# Patient Record
Sex: Female | Born: 1965 | Race: Black or African American | Hispanic: No | Marital: Single | Smoking: Current every day smoker
Health system: Southern US, Community
[De-identification: ages and names within clinical notes are randomized; demographics above are authoritative.]

## PROBLEM LIST (undated history)

## (undated) DIAGNOSIS — F32A Depression, unspecified: Secondary | ICD-10-CM

## (undated) DIAGNOSIS — J45909 Unspecified asthma, uncomplicated: Secondary | ICD-10-CM

## (undated) DIAGNOSIS — B20 Human immunodeficiency virus [HIV] disease: Secondary | ICD-10-CM

## (undated) DIAGNOSIS — E039 Hypothyroidism, unspecified: Secondary | ICD-10-CM

## (undated) DIAGNOSIS — S82899A Other fracture of unspecified lower leg, initial encounter for closed fracture: Secondary | ICD-10-CM

## (undated) DIAGNOSIS — Z21 Asymptomatic human immunodeficiency virus [HIV] infection status: Secondary | ICD-10-CM

## (undated) DIAGNOSIS — K469 Unspecified abdominal hernia without obstruction or gangrene: Secondary | ICD-10-CM

## (undated) DIAGNOSIS — A159 Respiratory tuberculosis unspecified: Secondary | ICD-10-CM

## (undated) DIAGNOSIS — F419 Anxiety disorder, unspecified: Secondary | ICD-10-CM

## (undated) DIAGNOSIS — B1920 Unspecified viral hepatitis C without hepatic coma: Secondary | ICD-10-CM

## (undated) DIAGNOSIS — I10 Essential (primary) hypertension: Secondary | ICD-10-CM

## (undated) DIAGNOSIS — R945 Abnormal results of liver function studies: Secondary | ICD-10-CM

## (undated) DIAGNOSIS — G589 Mononeuropathy, unspecified: Secondary | ICD-10-CM

## (undated) DIAGNOSIS — M359 Systemic involvement of connective tissue, unspecified: Secondary | ICD-10-CM

## (undated) DIAGNOSIS — K219 Gastro-esophageal reflux disease without esophagitis: Secondary | ICD-10-CM

## (undated) DIAGNOSIS — M419 Scoliosis, unspecified: Secondary | ICD-10-CM

## (undated) DIAGNOSIS — J449 Chronic obstructive pulmonary disease, unspecified: Secondary | ICD-10-CM

## (undated) DIAGNOSIS — F329 Major depressive disorder, single episode, unspecified: Secondary | ICD-10-CM

## (undated) HISTORY — PX: LIVER BIOPSY: SHX301

## (undated) HISTORY — DX: Abnormal results of liver function studies: R94.5

## (undated) HISTORY — PX: HERNIA REPAIR: SHX51

## (undated) HISTORY — PX: CHOLECYSTECTOMY: SHX55

---

## 2000-04-05 ENCOUNTER — Encounter: Payer: Self-pay | Admitting: Gastroenterology

## 2000-04-05 ENCOUNTER — Ambulatory Visit (HOSPITAL_COMMUNITY): Admission: RE | Admit: 2000-04-05 | Discharge: 2000-04-05 | Payer: Self-pay | Admitting: Gastroenterology

## 2000-04-05 ENCOUNTER — Encounter (INDEPENDENT_AMBULATORY_CARE_PROVIDER_SITE_OTHER): Payer: Self-pay | Admitting: Specialist

## 2000-08-27 ENCOUNTER — Emergency Department (HOSPITAL_COMMUNITY): Admission: EM | Admit: 2000-08-27 | Discharge: 2000-08-27 | Payer: Self-pay | Admitting: Emergency Medicine

## 2000-08-28 ENCOUNTER — Encounter: Payer: Self-pay | Admitting: Unknown Physician Specialty

## 2000-08-28 ENCOUNTER — Ambulatory Visit (HOSPITAL_COMMUNITY): Admission: RE | Admit: 2000-08-28 | Discharge: 2000-08-28 | Payer: Self-pay | Admitting: Unknown Physician Specialty

## 2000-12-03 ENCOUNTER — Emergency Department (HOSPITAL_COMMUNITY): Admission: EM | Admit: 2000-12-03 | Discharge: 2000-12-03 | Payer: Self-pay | Admitting: Emergency Medicine

## 2001-04-13 ENCOUNTER — Emergency Department (HOSPITAL_COMMUNITY): Admission: EM | Admit: 2001-04-13 | Discharge: 2001-04-13 | Payer: Self-pay | Admitting: Emergency Medicine

## 2002-08-13 ENCOUNTER — Encounter: Payer: Self-pay | Admitting: Emergency Medicine

## 2002-08-13 ENCOUNTER — Emergency Department (HOSPITAL_COMMUNITY): Admission: EM | Admit: 2002-08-13 | Discharge: 2002-08-13 | Payer: Self-pay | Admitting: Emergency Medicine

## 2002-10-10 ENCOUNTER — Emergency Department (HOSPITAL_COMMUNITY): Admission: EM | Admit: 2002-10-10 | Discharge: 2002-10-11 | Payer: Self-pay | Admitting: Emergency Medicine

## 2002-10-13 ENCOUNTER — Emergency Department (HOSPITAL_COMMUNITY): Admission: EM | Admit: 2002-10-13 | Discharge: 2002-10-13 | Payer: Self-pay | Admitting: Emergency Medicine

## 2002-11-11 ENCOUNTER — Encounter: Payer: Self-pay | Admitting: Internal Medicine

## 2002-11-11 ENCOUNTER — Ambulatory Visit (HOSPITAL_COMMUNITY): Admission: RE | Admit: 2002-11-11 | Discharge: 2002-11-11 | Payer: Self-pay | Admitting: Internal Medicine

## 2002-11-12 ENCOUNTER — Emergency Department (HOSPITAL_COMMUNITY): Admission: EM | Admit: 2002-11-12 | Discharge: 2002-11-12 | Payer: Self-pay | Admitting: Emergency Medicine

## 2003-11-05 ENCOUNTER — Ambulatory Visit: Payer: Self-pay | Admitting: *Deleted

## 2003-11-24 ENCOUNTER — Ambulatory Visit: Payer: Self-pay | Admitting: *Deleted

## 2004-01-08 ENCOUNTER — Ambulatory Visit: Payer: Self-pay | Admitting: Family Medicine

## 2004-01-15 ENCOUNTER — Ambulatory Visit: Payer: Self-pay | Admitting: Family Medicine

## 2004-01-16 ENCOUNTER — Emergency Department (HOSPITAL_COMMUNITY): Admission: EM | Admit: 2004-01-16 | Discharge: 2004-01-16 | Payer: Self-pay | Admitting: *Deleted

## 2004-01-18 ENCOUNTER — Ambulatory Visit: Payer: Self-pay | Admitting: Family Medicine

## 2004-02-08 ENCOUNTER — Ambulatory Visit: Payer: Self-pay | Admitting: Family Medicine

## 2004-02-09 ENCOUNTER — Ambulatory Visit: Payer: Self-pay | Admitting: Family Medicine

## 2004-02-13 ENCOUNTER — Emergency Department (HOSPITAL_COMMUNITY): Admission: EM | Admit: 2004-02-13 | Discharge: 2004-02-14 | Payer: Self-pay | Admitting: Emergency Medicine

## 2004-02-15 ENCOUNTER — Ambulatory Visit: Payer: Self-pay | Admitting: Family Medicine

## 2004-02-23 ENCOUNTER — Ambulatory Visit: Payer: Self-pay | Admitting: Family Medicine

## 2004-03-15 ENCOUNTER — Emergency Department (HOSPITAL_COMMUNITY): Admission: EM | Admit: 2004-03-15 | Discharge: 2004-03-15 | Payer: Self-pay | Admitting: *Deleted

## 2004-03-23 ENCOUNTER — Ambulatory Visit: Payer: Self-pay | Admitting: Family Medicine

## 2004-05-08 ENCOUNTER — Emergency Department (HOSPITAL_COMMUNITY): Admission: EM | Admit: 2004-05-08 | Discharge: 2004-05-08 | Payer: Self-pay | Admitting: Emergency Medicine

## 2004-07-13 ENCOUNTER — Ambulatory Visit: Payer: Self-pay | Admitting: Family Medicine

## 2004-07-19 ENCOUNTER — Emergency Department (HOSPITAL_COMMUNITY): Admission: EM | Admit: 2004-07-19 | Discharge: 2004-07-19 | Payer: Self-pay | Admitting: Emergency Medicine

## 2004-07-22 ENCOUNTER — Ambulatory Visit: Payer: Self-pay | Admitting: Family Medicine

## 2004-07-22 ENCOUNTER — Emergency Department (HOSPITAL_COMMUNITY): Admission: EM | Admit: 2004-07-22 | Discharge: 2004-07-22 | Payer: Self-pay | Admitting: Emergency Medicine

## 2004-07-26 ENCOUNTER — Emergency Department (HOSPITAL_COMMUNITY): Admission: EM | Admit: 2004-07-26 | Discharge: 2004-07-26 | Payer: Self-pay | Admitting: Emergency Medicine

## 2004-07-28 ENCOUNTER — Ambulatory Visit (HOSPITAL_COMMUNITY): Admission: RE | Admit: 2004-07-28 | Discharge: 2004-07-28 | Payer: Self-pay | Admitting: Family Medicine

## 2004-08-02 ENCOUNTER — Encounter: Payer: Self-pay | Admitting: Internal Medicine

## 2004-08-03 ENCOUNTER — Ambulatory Visit: Payer: Self-pay | Admitting: Family Medicine

## 2004-08-10 ENCOUNTER — Emergency Department (HOSPITAL_COMMUNITY): Admission: EM | Admit: 2004-08-10 | Discharge: 2004-08-10 | Payer: Self-pay | Admitting: Emergency Medicine

## 2004-09-05 ENCOUNTER — Ambulatory Visit: Payer: Self-pay | Admitting: Internal Medicine

## 2004-09-22 ENCOUNTER — Ambulatory Visit: Payer: Self-pay | Admitting: Family Medicine

## 2004-11-22 ENCOUNTER — Ambulatory Visit: Payer: Self-pay | Admitting: Family Medicine

## 2004-11-23 ENCOUNTER — Encounter (INDEPENDENT_AMBULATORY_CARE_PROVIDER_SITE_OTHER): Payer: Self-pay | Admitting: *Deleted

## 2004-11-23 ENCOUNTER — Ambulatory Visit (HOSPITAL_COMMUNITY): Admission: RE | Admit: 2004-11-23 | Discharge: 2004-11-23 | Payer: Self-pay | Admitting: General Surgery

## 2005-01-03 ENCOUNTER — Ambulatory Visit: Payer: Self-pay | Admitting: Nurse Practitioner

## 2005-06-30 ENCOUNTER — Ambulatory Visit: Payer: Self-pay | Admitting: Family Medicine

## 2005-06-30 ENCOUNTER — Emergency Department (HOSPITAL_COMMUNITY): Admission: EM | Admit: 2005-06-30 | Discharge: 2005-06-30 | Payer: Self-pay | Admitting: Family Medicine

## 2005-07-03 ENCOUNTER — Emergency Department (HOSPITAL_COMMUNITY): Admission: EM | Admit: 2005-07-03 | Discharge: 2005-07-03 | Payer: Self-pay | Admitting: Emergency Medicine

## 2005-07-15 ENCOUNTER — Emergency Department (HOSPITAL_COMMUNITY): Admission: EM | Admit: 2005-07-15 | Discharge: 2005-07-15 | Payer: Self-pay | Admitting: Family Medicine

## 2005-07-15 ENCOUNTER — Emergency Department (HOSPITAL_COMMUNITY): Admission: EM | Admit: 2005-07-15 | Discharge: 2005-07-15 | Payer: Self-pay | Admitting: Emergency Medicine

## 2005-07-20 ENCOUNTER — Emergency Department (HOSPITAL_COMMUNITY): Admission: EM | Admit: 2005-07-20 | Discharge: 2005-07-20 | Payer: Self-pay | Admitting: Emergency Medicine

## 2005-08-18 ENCOUNTER — Emergency Department (HOSPITAL_COMMUNITY): Admission: EM | Admit: 2005-08-18 | Discharge: 2005-08-19 | Payer: Self-pay | Admitting: *Deleted

## 2005-08-20 ENCOUNTER — Emergency Department (HOSPITAL_COMMUNITY): Admission: EM | Admit: 2005-08-20 | Discharge: 2005-08-20 | Payer: Self-pay | Admitting: Emergency Medicine

## 2005-08-25 ENCOUNTER — Emergency Department (HOSPITAL_COMMUNITY): Admission: EM | Admit: 2005-08-25 | Discharge: 2005-08-25 | Payer: Self-pay | Admitting: Emergency Medicine

## 2005-09-12 ENCOUNTER — Emergency Department (HOSPITAL_COMMUNITY): Admission: EM | Admit: 2005-09-12 | Discharge: 2005-09-12 | Payer: Self-pay | Admitting: Emergency Medicine

## 2006-07-13 ENCOUNTER — Emergency Department (HOSPITAL_COMMUNITY): Admission: EM | Admit: 2006-07-13 | Discharge: 2006-07-13 | Payer: Self-pay | Admitting: Emergency Medicine

## 2006-10-03 ENCOUNTER — Emergency Department (HOSPITAL_COMMUNITY): Admission: EM | Admit: 2006-10-03 | Discharge: 2006-10-04 | Payer: Self-pay | Admitting: Emergency Medicine

## 2006-12-07 ENCOUNTER — Emergency Department (HOSPITAL_COMMUNITY): Admission: EM | Admit: 2006-12-07 | Discharge: 2006-12-07 | Payer: Self-pay | Admitting: Emergency Medicine

## 2006-12-11 ENCOUNTER — Emergency Department (HOSPITAL_COMMUNITY): Admission: EM | Admit: 2006-12-11 | Discharge: 2006-12-11 | Payer: Self-pay | Admitting: Emergency Medicine

## 2007-02-28 ENCOUNTER — Encounter: Payer: Self-pay | Admitting: Family Medicine

## 2008-10-28 ENCOUNTER — Emergency Department (HOSPITAL_COMMUNITY): Admission: EM | Admit: 2008-10-28 | Discharge: 2008-10-29 | Payer: Self-pay | Admitting: Emergency Medicine

## 2008-11-03 ENCOUNTER — Emergency Department (HOSPITAL_COMMUNITY): Admission: EM | Admit: 2008-11-03 | Discharge: 2008-11-03 | Payer: Self-pay | Admitting: Emergency Medicine

## 2008-11-10 ENCOUNTER — Emergency Department (HOSPITAL_COMMUNITY): Admission: EM | Admit: 2008-11-10 | Discharge: 2008-11-10 | Payer: Self-pay | Admitting: Emergency Medicine

## 2008-11-10 ENCOUNTER — Emergency Department (HOSPITAL_COMMUNITY): Admission: EM | Admit: 2008-11-10 | Discharge: 2008-11-10 | Payer: Self-pay | Admitting: Dietician

## 2008-11-23 ENCOUNTER — Emergency Department: Payer: Self-pay | Admitting: Emergency Medicine

## 2008-11-26 ENCOUNTER — Emergency Department: Payer: Self-pay | Admitting: Emergency Medicine

## 2008-11-27 ENCOUNTER — Emergency Department (HOSPITAL_COMMUNITY): Admission: EM | Admit: 2008-11-27 | Discharge: 2008-11-27 | Payer: Self-pay | Admitting: Emergency Medicine

## 2008-11-28 ENCOUNTER — Emergency Department (HOSPITAL_COMMUNITY): Admission: EM | Admit: 2008-11-28 | Discharge: 2008-11-29 | Payer: Self-pay | Admitting: Emergency Medicine

## 2009-01-06 ENCOUNTER — Emergency Department (HOSPITAL_COMMUNITY): Admission: EM | Admit: 2009-01-06 | Discharge: 2009-01-06 | Payer: Self-pay | Admitting: Emergency Medicine

## 2009-01-11 ENCOUNTER — Emergency Department (HOSPITAL_COMMUNITY): Admission: EM | Admit: 2009-01-11 | Discharge: 2009-01-11 | Payer: Self-pay | Admitting: Emergency Medicine

## 2009-01-14 ENCOUNTER — Emergency Department (HOSPITAL_COMMUNITY): Admission: EM | Admit: 2009-01-14 | Discharge: 2009-01-14 | Payer: Self-pay | Admitting: Emergency Medicine

## 2009-01-17 ENCOUNTER — Emergency Department (HOSPITAL_COMMUNITY): Admission: EM | Admit: 2009-01-17 | Discharge: 2009-01-17 | Payer: Self-pay | Admitting: Emergency Medicine

## 2009-01-22 ENCOUNTER — Emergency Department (HOSPITAL_COMMUNITY): Admission: EM | Admit: 2009-01-22 | Discharge: 2009-01-22 | Payer: Self-pay | Admitting: Emergency Medicine

## 2009-01-25 ENCOUNTER — Emergency Department (HOSPITAL_COMMUNITY): Admission: EM | Admit: 2009-01-25 | Discharge: 2009-01-25 | Payer: Self-pay | Admitting: Emergency Medicine

## 2009-02-02 ENCOUNTER — Encounter: Payer: Self-pay | Admitting: Infectious Disease

## 2009-02-09 ENCOUNTER — Emergency Department (HOSPITAL_COMMUNITY): Admission: EM | Admit: 2009-02-09 | Discharge: 2009-02-09 | Payer: Self-pay | Admitting: Emergency Medicine

## 2009-02-24 ENCOUNTER — Emergency Department (HOSPITAL_COMMUNITY): Admission: EM | Admit: 2009-02-24 | Discharge: 2009-02-24 | Payer: Self-pay | Admitting: Emergency Medicine

## 2009-03-01 ENCOUNTER — Emergency Department (HOSPITAL_COMMUNITY): Admission: EM | Admit: 2009-03-01 | Discharge: 2009-03-01 | Payer: Self-pay | Admitting: Emergency Medicine

## 2009-03-11 ENCOUNTER — Encounter: Payer: Self-pay | Admitting: Internal Medicine

## 2009-03-12 ENCOUNTER — Encounter: Payer: Self-pay | Admitting: Infectious Diseases

## 2009-03-18 ENCOUNTER — Encounter: Payer: Self-pay | Admitting: Internal Medicine

## 2009-03-18 ENCOUNTER — Ambulatory Visit: Payer: Self-pay | Admitting: Internal Medicine

## 2009-03-18 DIAGNOSIS — F314 Bipolar disorder, current episode depressed, severe, without psychotic features: Secondary | ICD-10-CM | POA: Insufficient documentation

## 2009-03-18 DIAGNOSIS — M25569 Pain in unspecified knee: Secondary | ICD-10-CM | POA: Insufficient documentation

## 2009-03-18 DIAGNOSIS — B1920 Unspecified viral hepatitis C without hepatic coma: Secondary | ICD-10-CM | POA: Insufficient documentation

## 2009-03-18 DIAGNOSIS — E119 Type 2 diabetes mellitus without complications: Secondary | ICD-10-CM | POA: Insufficient documentation

## 2009-03-18 DIAGNOSIS — B20 Human immunodeficiency virus [HIV] disease: Secondary | ICD-10-CM | POA: Insufficient documentation

## 2009-03-18 DIAGNOSIS — F209 Schizophrenia, unspecified: Secondary | ICD-10-CM | POA: Insufficient documentation

## 2009-03-18 LAB — CONVERTED CEMR LAB
HCV Quantitative: 481000 intl units/mL — ABNORMAL HIGH (ref <43)
HIV 1 RNA Quant: 59800 copies/mL — ABNORMAL HIGH (ref <48)

## 2009-03-19 ENCOUNTER — Ambulatory Visit: Payer: Self-pay | Admitting: Internal Medicine

## 2009-03-19 DIAGNOSIS — K439 Ventral hernia without obstruction or gangrene: Secondary | ICD-10-CM | POA: Insufficient documentation

## 2009-03-19 DIAGNOSIS — I10 Essential (primary) hypertension: Secondary | ICD-10-CM

## 2009-03-19 DIAGNOSIS — E1159 Type 2 diabetes mellitus with other circulatory complications: Secondary | ICD-10-CM | POA: Insufficient documentation

## 2009-03-19 DIAGNOSIS — M549 Dorsalgia, unspecified: Secondary | ICD-10-CM | POA: Insufficient documentation

## 2009-03-22 ENCOUNTER — Encounter: Payer: Self-pay | Admitting: Licensed Clinical Social Worker

## 2009-04-05 ENCOUNTER — Encounter: Payer: Self-pay | Admitting: Internal Medicine

## 2009-04-08 ENCOUNTER — Ambulatory Visit: Payer: Self-pay | Admitting: Internal Medicine

## 2009-04-08 LAB — CONVERTED CEMR LAB
Barbiturate Quant, Ur: NEGATIVE
Blood Glucose, Fingerstick: 127
Blood Glucose, Home Monitor: 2 mg/dL
Cocaine Metabolites: POSITIVE — AB
Creatinine,U: 230.8 mg/dL
Methadone: NEGATIVE
Opiates: NEGATIVE

## 2009-04-09 ENCOUNTER — Encounter: Payer: Self-pay | Admitting: Internal Medicine

## 2009-04-12 ENCOUNTER — Telehealth: Payer: Self-pay

## 2009-04-14 ENCOUNTER — Encounter: Payer: Self-pay | Admitting: Internal Medicine

## 2009-04-21 ENCOUNTER — Telehealth (INDEPENDENT_AMBULATORY_CARE_PROVIDER_SITE_OTHER): Payer: Self-pay | Admitting: *Deleted

## 2009-05-04 ENCOUNTER — Encounter (INDEPENDENT_AMBULATORY_CARE_PROVIDER_SITE_OTHER): Payer: Self-pay | Admitting: *Deleted

## 2009-05-13 ENCOUNTER — Telehealth (INDEPENDENT_AMBULATORY_CARE_PROVIDER_SITE_OTHER): Payer: Self-pay | Admitting: *Deleted

## 2009-05-31 ENCOUNTER — Ambulatory Visit: Payer: Self-pay | Admitting: Internal Medicine

## 2009-05-31 ENCOUNTER — Telehealth: Payer: Self-pay | Admitting: Internal Medicine

## 2009-05-31 DIAGNOSIS — H9209 Otalgia, unspecified ear: Secondary | ICD-10-CM | POA: Insufficient documentation

## 2009-05-31 DIAGNOSIS — K089 Disorder of teeth and supporting structures, unspecified: Secondary | ICD-10-CM | POA: Insufficient documentation

## 2009-05-31 LAB — CONVERTED CEMR LAB: Blood Glucose, Fingerstick: 111

## 2009-06-01 ENCOUNTER — Telehealth: Payer: Self-pay | Admitting: Internal Medicine

## 2009-06-01 ENCOUNTER — Telehealth: Payer: Self-pay | Admitting: *Deleted

## 2009-06-02 ENCOUNTER — Emergency Department (HOSPITAL_COMMUNITY): Admission: EM | Admit: 2009-06-02 | Discharge: 2009-06-02 | Payer: Self-pay | Admitting: Emergency Medicine

## 2009-06-03 ENCOUNTER — Telehealth: Payer: Self-pay | Admitting: Internal Medicine

## 2009-06-11 ENCOUNTER — Telehealth: Payer: Self-pay | Admitting: Internal Medicine

## 2009-06-11 ENCOUNTER — Encounter: Payer: Self-pay | Admitting: Internal Medicine

## 2009-06-11 ENCOUNTER — Ambulatory Visit: Payer: Self-pay | Admitting: Internal Medicine

## 2009-06-11 DIAGNOSIS — R21 Rash and other nonspecific skin eruption: Secondary | ICD-10-CM | POA: Insufficient documentation

## 2009-06-11 LAB — CONVERTED CEMR LAB
BUN: 17 mg/dL (ref 6–23)
Basophils Relative: 0 % (ref 0–1)
CO2: 24 meq/L (ref 19–32)
Calcium: 10.2 mg/dL (ref 8.4–10.5)
Chloride: 97 meq/L (ref 96–112)
Creatinine, Ser: 1.16 mg/dL (ref 0.40–1.20)
Eosinophils Absolute: 0.1 10*3/uL (ref 0.0–0.7)
Eosinophils Relative: 3 % (ref 0–5)
Glucose, Bld: 93 mg/dL (ref 70–99)
HCT: 42.3 % (ref 36.0–46.0)
HIV 1 RNA Quant: 2360 copies/mL — ABNORMAL HIGH (ref <48)
HIV-1 RNA Quant, Log: 3.37 — ABNORMAL HIGH (ref <1.68)
Lymphs Abs: 1.2 10*3/uL (ref 0.7–4.0)
MCHC: 35 g/dL (ref 30.0–36.0)
MCV: 89.1 fL (ref 78.0–100.0)
Monocytes Relative: 11 % (ref 3–12)
Neutrophils Relative %: 54 % (ref 43–77)
Platelets: 130 10*3/uL — ABNORMAL LOW (ref 150–400)
RBC: 4.75 M/uL (ref 3.87–5.11)
Total Bilirubin: 0.3 mg/dL (ref 0.3–1.2)
WBC: 3.9 10*3/uL — ABNORMAL LOW (ref 4.0–10.5)

## 2009-06-16 ENCOUNTER — Ambulatory Visit: Payer: Self-pay | Admitting: Internal Medicine

## 2009-06-16 DIAGNOSIS — G609 Hereditary and idiopathic neuropathy, unspecified: Secondary | ICD-10-CM | POA: Insufficient documentation

## 2009-06-22 ENCOUNTER — Encounter: Payer: Self-pay | Admitting: Internal Medicine

## 2009-06-22 ENCOUNTER — Telehealth: Payer: Self-pay | Admitting: Internal Medicine

## 2009-06-23 ENCOUNTER — Telehealth: Payer: Self-pay | Admitting: *Deleted

## 2009-06-23 ENCOUNTER — Encounter: Payer: Self-pay | Admitting: Internal Medicine

## 2009-06-24 ENCOUNTER — Telehealth: Payer: Self-pay | Admitting: Internal Medicine

## 2009-06-28 ENCOUNTER — Emergency Department (HOSPITAL_COMMUNITY): Admission: EM | Admit: 2009-06-28 | Discharge: 2009-06-28 | Payer: Self-pay | Admitting: Family Medicine

## 2009-06-28 ENCOUNTER — Encounter: Payer: Self-pay | Admitting: Internal Medicine

## 2009-06-29 ENCOUNTER — Telehealth: Payer: Self-pay | Admitting: *Deleted

## 2009-06-29 ENCOUNTER — Encounter: Payer: Self-pay | Admitting: Internal Medicine

## 2009-06-30 ENCOUNTER — Ambulatory Visit: Payer: Self-pay | Admitting: Internal Medicine

## 2009-06-30 DIAGNOSIS — F1910 Other psychoactive substance abuse, uncomplicated: Secondary | ICD-10-CM | POA: Insufficient documentation

## 2009-06-30 LAB — CONVERTED CEMR LAB
Amphetamine Screen, Ur: NEGATIVE
Benzodiazepines.: NEGATIVE
Marijuana Metabolite: POSITIVE — AB
Phencyclidine (PCP): NEGATIVE

## 2009-07-01 ENCOUNTER — Ambulatory Visit: Payer: Self-pay | Admitting: Internal Medicine

## 2009-07-05 ENCOUNTER — Telehealth: Payer: Self-pay | Admitting: Internal Medicine

## 2009-07-06 ENCOUNTER — Encounter: Payer: Self-pay | Admitting: Internal Medicine

## 2009-07-07 ENCOUNTER — Ambulatory Visit: Payer: Self-pay | Admitting: Internal Medicine

## 2009-07-07 ENCOUNTER — Emergency Department (HOSPITAL_COMMUNITY): Admission: EM | Admit: 2009-07-07 | Discharge: 2009-07-07 | Payer: Self-pay | Admitting: Emergency Medicine

## 2009-07-09 ENCOUNTER — Encounter (INDEPENDENT_AMBULATORY_CARE_PROVIDER_SITE_OTHER): Payer: Self-pay | Admitting: *Deleted

## 2009-07-19 ENCOUNTER — Encounter: Payer: Self-pay | Admitting: Internal Medicine

## 2009-07-21 ENCOUNTER — Encounter (INDEPENDENT_AMBULATORY_CARE_PROVIDER_SITE_OTHER): Payer: Self-pay | Admitting: *Deleted

## 2009-07-28 ENCOUNTER — Telehealth (INDEPENDENT_AMBULATORY_CARE_PROVIDER_SITE_OTHER): Payer: Self-pay | Admitting: *Deleted

## 2009-07-28 ENCOUNTER — Encounter (INDEPENDENT_AMBULATORY_CARE_PROVIDER_SITE_OTHER): Payer: Self-pay | Admitting: *Deleted

## 2009-08-05 ENCOUNTER — Emergency Department (HOSPITAL_COMMUNITY): Admission: EM | Admit: 2009-08-05 | Discharge: 2009-08-05 | Payer: Self-pay | Admitting: Emergency Medicine

## 2009-08-16 ENCOUNTER — Encounter (INDEPENDENT_AMBULATORY_CARE_PROVIDER_SITE_OTHER): Payer: Self-pay | Admitting: *Deleted

## 2009-08-16 ENCOUNTER — Telehealth: Payer: Self-pay | Admitting: Internal Medicine

## 2009-08-16 ENCOUNTER — Ambulatory Visit: Payer: Self-pay | Admitting: Internal Medicine

## 2009-08-16 LAB — CONVERTED CEMR LAB
ALT: 57 units/L — ABNORMAL HIGH (ref 0–35)
Basophils Relative: 0 % (ref 0–1)
CO2: 24 meq/L (ref 19–32)
Calcium: 9.3 mg/dL (ref 8.4–10.5)
Chloride: 103 meq/L (ref 96–112)
Eosinophils Absolute: 0 10*3/uL (ref 0.0–0.7)
Glucose, Bld: 114 mg/dL — ABNORMAL HIGH (ref 70–99)
HIV 1 RNA Quant: <48 copies/mL (ref <48)
HIV-1 RNA Quant, Log: <1.68 (ref <1.68)
Hemoglobin: 12.2 g/dL (ref 12.0–15.0)
Lymphs Abs: 1.1 10*3/uL (ref 0.7–4.0)
MCHC: 34.5 g/dL (ref 30.0–36.0)
MCV: 94.4 fL (ref 78.0–100.0)
Monocytes Absolute: 0.2 10*3/uL (ref 0.1–1.0)
Monocytes Relative: 9 % (ref 3–12)
Neutro Abs: 1.5 10*3/uL — ABNORMAL LOW (ref 1.7–7.7)
RBC: 3.75 M/uL — ABNORMAL LOW (ref 3.87–5.11)
Sodium: 138 meq/L (ref 135–145)
Total Bilirubin: 0.6 mg/dL (ref 0.3–1.2)
Total Protein: 7.3 g/dL (ref 6.0–8.3)
WBC: 2.8 10*3/uL — ABNORMAL LOW (ref 4.0–10.5)

## 2009-08-17 ENCOUNTER — Encounter (INDEPENDENT_AMBULATORY_CARE_PROVIDER_SITE_OTHER): Payer: Self-pay | Admitting: *Deleted

## 2009-08-30 ENCOUNTER — Encounter: Payer: Self-pay | Admitting: Internal Medicine

## 2009-09-07 ENCOUNTER — Telehealth: Payer: Self-pay | Admitting: Internal Medicine

## 2009-09-08 ENCOUNTER — Ambulatory Visit: Payer: Self-pay | Admitting: Internal Medicine

## 2009-09-08 DIAGNOSIS — R079 Chest pain, unspecified: Secondary | ICD-10-CM | POA: Insufficient documentation

## 2009-09-09 ENCOUNTER — Telehealth: Payer: Self-pay | Admitting: *Deleted

## 2009-09-10 ENCOUNTER — Encounter (INDEPENDENT_AMBULATORY_CARE_PROVIDER_SITE_OTHER): Payer: Self-pay | Admitting: *Deleted

## 2009-09-23 ENCOUNTER — Telehealth: Payer: Self-pay | Admitting: Internal Medicine

## 2009-10-07 ENCOUNTER — Telehealth (INDEPENDENT_AMBULATORY_CARE_PROVIDER_SITE_OTHER): Payer: Self-pay | Admitting: *Deleted

## 2009-11-01 ENCOUNTER — Emergency Department (HOSPITAL_COMMUNITY): Admission: EM | Admit: 2009-11-01 | Discharge: 2009-11-01 | Payer: Self-pay | Admitting: Emergency Medicine

## 2009-11-05 ENCOUNTER — Emergency Department (HOSPITAL_COMMUNITY): Admission: EM | Admit: 2009-11-05 | Discharge: 2009-11-05 | Payer: Self-pay | Admitting: Emergency Medicine

## 2009-11-11 ENCOUNTER — Emergency Department (HOSPITAL_COMMUNITY): Admission: EM | Admit: 2009-11-11 | Discharge: 2009-11-11 | Payer: Self-pay | Admitting: Family Medicine

## 2009-11-16 ENCOUNTER — Telehealth (INDEPENDENT_AMBULATORY_CARE_PROVIDER_SITE_OTHER): Payer: Self-pay | Admitting: *Deleted

## 2009-12-17 ENCOUNTER — Ambulatory Visit: Payer: Self-pay | Admitting: Internal Medicine

## 2009-12-17 LAB — CONVERTED CEMR LAB
ALT: 50 units/L — ABNORMAL HIGH (ref 0–35)
AST: 49 units/L — ABNORMAL HIGH (ref 0–37)
Basophils Absolute: 0 10*3/uL (ref 0.0–0.1)
Basophils Relative: 0 % (ref 0–1)
CO2: 26 meq/L (ref 19–32)
Calcium: 8.9 mg/dL (ref 8.4–10.5)
Chloride: 107 meq/L (ref 96–112)
Creatinine, Ser: 1.15 mg/dL (ref 0.40–1.20)
HIV 1 RNA Quant: 34 copies/mL — ABNORMAL HIGH (ref <20)
Hemoglobin: 12.3 g/dL (ref 12.0–15.0)
Lymphocytes Relative: 54 % — ABNORMAL HIGH (ref 12–46)
MCHC: 33.6 g/dL (ref 30.0–36.0)
Monocytes Absolute: 0.2 10*3/uL (ref 0.1–1.0)
Neutro Abs: 0.8 10*3/uL — ABNORMAL LOW (ref 1.7–7.7)
Neutrophils Relative %: 32 % — ABNORMAL LOW (ref 43–77)
Platelets: 130 10*3/uL — ABNORMAL LOW (ref 150–400)
Potassium: 5.1 meq/L (ref 3.5–5.3)
RDW: 14.6 % (ref 11.5–15.5)
Sodium: 143 meq/L (ref 135–145)
Total Protein: 6.6 g/dL (ref 6.0–8.3)

## 2010-01-17 ENCOUNTER — Encounter: Payer: Self-pay | Admitting: Internal Medicine

## 2010-01-17 ENCOUNTER — Inpatient Hospital Stay (HOSPITAL_COMMUNITY)
Admission: EM | Admit: 2010-01-17 | Discharge: 2010-01-20 | Payer: Self-pay | Source: Home / Self Care | Admitting: Emergency Medicine

## 2010-01-18 ENCOUNTER — Ambulatory Visit: Payer: Self-pay | Admitting: Infectious Disease

## 2010-01-25 ENCOUNTER — Emergency Department (HOSPITAL_COMMUNITY): Admission: EM | Admit: 2010-01-25 | Discharge: 2010-01-25 | Payer: Self-pay | Admitting: Emergency Medicine

## 2010-03-20 ENCOUNTER — Encounter: Payer: Self-pay | Admitting: Internal Medicine

## 2010-03-20 ENCOUNTER — Encounter: Payer: Self-pay | Admitting: General Surgery

## 2010-03-28 ENCOUNTER — Encounter (INDEPENDENT_AMBULATORY_CARE_PROVIDER_SITE_OTHER): Payer: Self-pay | Admitting: *Deleted

## 2010-03-31 NOTE — Letter (Signed)
Summary: Historic Patient File  Historic Patient File   Imported By: Lind Guest 03/04/2010 16:05:58  _____________________________________________________________________  External Attachment:    Type:   Image     Comment:   External Document

## 2010-03-31 NOTE — Progress Notes (Signed)
Summary: diabetes testing supplies/dmr  Phone Note Outgoing Call   Call placed by: Jamison Neighbor RD,CDE,  May 31, 2009 4:04 PM Summary of Call: needs prescription for testing supplies    New/Updated Medications: PRODIGY TWIST TOP LANCETS 28G  MISC (LANCETS) use to check blood sugar a few times a week Prescriptions: PRODIGY BLOOD GLUCOSE TEST  STRP (GLUCOSE BLOOD) use to check blood sugar a few timnes a week or when you feel blood sugar may be high (increased thirst, peeing,hunger, tiredness)  #50 x 6   Entered by:   Jamison Neighbor RD,CDE   Authorized by:   Melida Quitter MD   Signed by:   Melida Quitter MD on 05/31/2009   Method used:   Electronically to        [facility] [address] [phone]* (retail)       [address]       [place], [place]  [address]       Ph: [phone] or [phone]       Fax: [phone]   RxID:   [id number] PRODIGY TWIST TOP LANCETS 28G  MISC (LANCETS) use to check blood sugar a few times a week  #100 x 2   Entered by:   Jamison Neighbor RD,CDE   Authorized by:   Melida Quitter MD   Signed by:   Melida Quitter MD on 05/31/2009   Method used:   Electronically to        [facility]  [address] [phone]* (retail)       [address]       [place], [place]  [address]       Ph: [phone] or [phone]       Fax: [phone]   RxID:   [id number]   Appended Document: diabetes testing supplies/dmr called alternative number listed (primary phoe number is not working) and left message that prescription should be ready to pick up for diabetes testing supplies.

## 2010-03-31 NOTE — Progress Notes (Signed)
Summary: Pt. needs OV w/ Dr. Philipp Deputy  ---- Converted from flag ---- ---- 06/02/2009 9:15 AM, Yisroel Ramming MD wrote: pt needs f/u appt  ---- 06/01/2009 4:56 PM, Jennet Maduro RN wrote: Please see Internal Medicine phone notes re: use of "street" drugs.  There is a refill request for trazodone in your box.  What would you like to do about the trazodone?  Let Annice Pih or myself know.  Thank you.  Angelique Blonder ------------------------------ Jennet Maduro RN  June 03, 2009 8:53 AM       Additional Follow-up for Phone Call Additional follow up Details #2::    ok to refill Follow-up by: Yisroel Ramming MD,  June 03, 2009 9:48 AM  RX refilled by Dr. Irine Seal @ [facility]. Jennet Maduro RN  June 03, 2009 3:09 PM

## 2010-03-31 NOTE — Letter (Signed)
Summary: Juanell Fairly: Income Verification  Juanell Fairly: Income Verification   Imported By: Florinda Marker 03/25/2009 11:58:16  _____________________________________________________________________  External Attachment:    Type:   Image     Comment:   External Document

## 2010-03-31 NOTE — Miscellaneous (Signed)
Summary: [name]  [facility]   Imported By: Florinda Marker 03/24/2009 14:31:04  _____________________________________________________________________  External Attachment:    Type:   Image     Comment:   External Document

## 2010-03-31 NOTE — Assessment & Plan Note (Signed)
Summary: F/U/APPT/VS   CC:  follow-up visit, wt. loss, elevated B/P, has been out of B/P meds for 5 days, and out of all meds except HIV meds.  History of Present Illness: Pt brought to appt by bridge counselor.  She has been living in an abandoned trailer.  She has no money to pay her co-pays for her medications so she has been off all of them except her HIV meds. She c/o painful rash mostly on her foot.  She also c/o wisdom tooth pain - has appt to have it extracted. She has lost weight because she has no appetite due to the tooth pain.  Preventive Screening-Counseling & Management  Alcohol-Tobacco     Alcohol drinks/day: few times weekly     Alcohol type: beer     Smoking Status: current     Smoking Cessation Counseling: yes     Packs/Day: 1.0     Year Started: started 12-13 yrs ago  Caffeine-Diet-Exercise     Caffeine use/day: sodas     Type of exercise: walking     Exercise (avg: min/session): >60     Times/week: 7  Safety-Violence-Falls     Seat Belt Use: yes      Drug Use:  Yes.     Updated Prior Medication List: ACTOS 30 MG TABS (PIOGLITAZONE HCL)  CATAPRES 0.1 MG TABS (CLONIDINE HCL) Take 1 tablet by mouth two times a day PROZAC 40 MG CAPS (FLUOXETINE HCL) take once daily HYDROXYZINE HCL 25 MG TABS (HYDROXYZINE HCL) one four times a day SEROQUEL XR 300 MG XR24H-TAB (QUETIAPINE FUMARATE) take once daily PERCOCET 5-325 MG TABS (OXYCODONE-ACETAMINOPHEN) Take 1 tab every 6 hours as needed for pain, not to exceed 3 tablets daily. OMEPRAZOLE 40 MG CPDR (OMEPRAZOLE) take once daily KALETRA 200-50 MG TABS (LOPINAVIR-RITONAVIR) Take 2 tablets by mouth twice a day TRUVADA 200-300 MG TABS (EMTRICITABINE-TENOFOVIR) take one daily TRAZODONE HCL 100 MG TABS (TRAZODONE HCL) Take 1 tablet by mouth at bedtime IBUPROFEN 600 MG TABS (IBUPROFEN) tke one as needed LISINOPRIL 40 MG TABS (LISINOPRIL) Take 1 tablet by mouth once a day ULTRAM 50 MG TABS (TRAMADOL HCL) Take 1 tablet by mouth  every 8 hours as needed ENSURE  LIQD (NUTRITIONAL SUPPLEMENTS) take one three times daily EAR WAX DROPS 6.5 % SOLN (CARBAMIDE PEROXIDE) use 1-2 drop daily in both ears PRODIGY TWIST TOP LANCETS 28G  MISC (LANCETS) use to check blood sugar a few times a week PRODIGY BLOOD GLUCOSE TEST  STRP (GLUCOSE BLOOD) use to check blood sugar a few timnes a week or when you feel blood sugar may be high (increased thirst, peeing,hunger, tiredness) AUGMENTIN 875-125 MG TABS (AMOXICILLIN-POT CLAVULANATE) Take 1 tablet by mouth two times a day  Current Allergies (reviewed today): No known allergies  Review of Systems       The patient complains of anorexia and weight loss.  The patient denies fever, chest pain, and headaches.    Vital Signs:  Patient profile:   45 year old female Height:      59 inches (149.86 cm) Weight:      105.0 pounds (47.73 kg) BMI:     21.28 Temp:     97.7 degrees F (36.50 degrees C) oral Pulse rate:   73 / minute BP sitting:   169 / 115  (right arm)  Vitals Entered By: Wendall Mola CMA Duncan Dull) (June 11, 2009 9:55 AM) CC: follow-up visit, wt. loss, elevated B/P, has been out of B/P meds for 5  days, out of all meds except HIV meds Is Patient Diabetic? Yes Did you bring your meter with you today? No Pain Assessment Patient in pain? yes     Location: back and heel Intensity: 10 Type: aching and sharp Onset of pain  Constant Nutritional Status BMI of 19 -24 = normal Nutritional Status Detail appetite "not good"  Does patient need assistance? Functional Status Self care Ambulation Normal Comments medications are at pharmacy but pt does not have copay, no RX for pain meds   Physical Exam  General:  alert, well-hydrated, and disheveled.   Head:  normocephalic and atraumatic.   Mouth:  pharynx pink and moist and poor dentition.   Lungs:  normal breath sounds.      Impression & Recommendations:  Problem # 1:  HIV INFECTION (ICD-042)  Will obtain labs  today and have pt f/u in 2 weeks. Will enroll her in [facility] and have meds delivered here for her to pick up. They will deliver even if she does not have a co-pay. Diagnostics Reviewed:  HIV: REACTIVE (03/18/2009)   HIV-Western blot: * (03/18/2009)   CD4: 300 (03/19/2009)   WBC: 2.7 (03/18/2009)   Hgb: 11.6 (03/18/2009)   HCT: 33.3 (03/18/2009)   Platelets: 123 (03/18/2009) HIV genotype: * (03/18/2009)   HIV-1 RNA: 59800 (03/18/2009)   HBSAg: NEG (03/18/2009)  Orders: T-CD4SP ([facility]) (CD4SP) T-HIV Viral Load (62130-86578) T-Comprehensive Metabolic Panel (46962-95284) T-CBC w/Diff (13244-01027) Est. Patient Level IV (25366)  Problem # 2:  SKIN RASH (ICD-782.1)  refer to dermatology Orders: Dermatology Referral Horton Marshall)  Orders: Dermatology Referral ([facility]erm[facility]) T-GC Probe, urine [id number]) T-Chlamydia  Probe, urine ([id number])  Problem # 3:  UNSPECIFIED DISORDER TEETH&SUPPORTING STRUCTURES (ICD-525.9) will treat with augmentin keep appt with Dentist  Problem # 4:  HYPERTENSION (ICD-401.9) I will ask if [facility] can fill these meds x 1 pending [facility] delivery. Her updated medication list for this problem includes:    Catapres 0.1 Mg Tabs (Clonidine hcl) .Marland Kitchen... Take 1 tablet by mouth two times a day    Lisinopril 40 Mg Tabs (Lisinopril) .Marland Kitchen... Take 1 tablet by mouth once a day  Problem # 5:  DM (ICD-250.00) F/u with PCP. Her updated medication list for this problem includes:    Actos 30 Mg Tabs (Pioglitazone hcl)    Lisinopril 40 Mg Tabs (Lisinopril) .Marland Kitchen... Take 1 tablet by mouth once a day  Medications Added to Medication List This Visit: 1)  Augmentin 875-125 Mg Tabs (Amoxicillin-pot clavulanate) .... Take 1 tablet by mouth two times a day  Other Orders: Pneumococcal Vaccine (29518) Admin 1st Vaccine (84166)  Patient Instructions: 1)  Please schedule a follow-up appointment in 2 weeks. 2)  Please cancel lab appt - drawn today  Prescriptions: ENSURE  LIQD (NUTRITIONAL  SUPPLEMENTS) take one three times daily  #1 case x 6   Entered and Authorized by:   Yisroel Ramming MD   Signed by:   Yisroel Ramming MD on 06/11/2009   Method used:   Print then Give to Patient   RxID:   [id number] AUGMENTIN 875-125 MG TABS (AMOXICILLIN-POT CLAVULANATE) Take 1 tablet by mouth two times a day  #20 x 0   Entered and Authorized by:   Yisroel Ramming MD   Signed by:   Yisroel Ramming MD on 06/11/2009   Method used:   Print then Give to Patient   RxID:   [id number] AUGMENTIN 875-125 MG TABS (AMOXICILLIN-POT CLAVULANATE) Take 1 tablet by mouth two times a day  #20 x  0   Entered and Authorized by:   Yisroel Ramming MD   Signed by:   Yisroel Ramming MD on 06/11/2009   Method used:   Print then Give to Patient   RxID:   [id number]    Immunizations Administered:  Pneumonia Vaccine:    Vaccine Type: Pneumovax    Site: right deltoid    Mfr: Merck    Dose: 0.5 ml    Route: IM    Given by: Wendall Mola CMA ( AAMA)    Exp. Date: 12/22/2010    Lot #: [id number]   [facility] Enrollment application completed and faxed. Paulo Fruit  BS,CPht II,MPH  June 11, 2009 11:34 AM

## 2010-03-31 NOTE — Progress Notes (Signed)
Summary: Medications arrived via [facility] pharmacy  Phone Note Refill Request      Prescriptions: CLONIDINE HCL 0.2 MG TABS (CLONIDINE HCL) Take 1 tablet by mouth two times a day  #60 x 0   Entered by:   Paulo Fruit  BS,CPht II,MPH   Authorized by:   Yisroel Ramming MD   Signed by:   Paulo Fruit  BS,CPht II,MPH on 07/05/2009   Method used:   Samples Given   RxID:   [id number] PATANOL 0.1 % SOLN (OLOPATADINE HCL) one drop two times a day  #10 ml x 0   Entered by:   Paulo Fruit  BS,CPht II,MPH   Authorized by:   Yisroel Ramming MD   Signed by:   Paulo Fruit  BS,CPht II,MPH on 07/05/2009   Method used:   Samples Given   RxID:   [id number]  Patient Assist Medication Verification: Medication name: Patanol eye drop RX #  [id number] Tech approvalMLD:  Patient Assist Medication Verification: Medication name:Clonidine HCL 0.2mg  RX # [id number] Tech approval:MLD  Informed her  bridge counselor that her medication is ready for her to pick up. Paulo Fruit  BS,CPht II,MPH  Jul 05, 2009 12:46 PM   Appended Document: Medications arrived via [facility] pharmacy Prescription/Samples picked up by: patient

## 2010-03-31 NOTE — Miscellaneous (Signed)
Summary: RW update  Clinical Lists Changes  Observations: Added new observation of LATINO/HISP: No (09/10/2009 14:51) Added new observation of [place]: [place] (09/10/2009 14:51) Added new observation of RW VITAL STA: Active (09/10/2009 14:51) Added new observation of PAYOR: Medicaid (09/10/2009 14:51)

## 2010-03-31 NOTE — Assessment & Plan Note (Signed)
Summary: ear pain/toothache/gg   Vital Signs:  Patient profile:   45 year old female Height:      59 inches Weight:      113 pounds BMI:     22.91 Temp:     97.9 degrees F oral Pulse rate:   84 / minute BP sitting:   192 / 117  (right arm)  Vitals Entered By: Filomena Jungling NT II (May 31, 2009 3:24 PM) CC: EAR AND TOOTH ACHE- BACK PAIN ALSO Is Patient Diabetic? Yes Did you bring your meter with you today? No Pain Assessment Patient in pain? yes     Location: back Intensity: 6 Type: aching Onset of pain  2-3 WEEKS FOR TOOTH AND EAR Nutritional Status BMI of 19 -24 = normal  Have you ever been in a relationship where you felt threatened, hurt or afraid?No   Does patient need assistance? Functional Status Self care Ambulation Normal   CC:  EAR AND TOOTH ACHE- BACK PAIN ALSO.  History of Present Illness: 45 yo female with PMH outlined below presents today to [facility] with main concern of toothache that has been getting progressively worse since 2 weeks ago. She has dentist appointment on April 28th but cannot wait for it. She thinks her earache is associated with it and is currently on abx for ear infection. She denies fever, chills, chest pain, no abdominal or urinary concerns, no other systemic symptoms.   Also needs refill on all of her meds. Says she has not been taking any since she ran out of them.  Problems Prior to Update: 1)  Back Pain, Chronic  (ICD-724.5) 2)  Hypertension  (ICD-401.9) 3)  Abdominal Wall Hernia  (ICD-553.20) 4)  Knee Pain, Right  (ICD-719.46) 5)  Hx of Suicidal Ideation  (ICD-V62.84) 6)  Schizophrenia  (ICD-295.90) 7)  Bipolar Affective Disorder, Depressed, Severe  (ICD-296.53) 8)  Dm  (ICD-250.00) 9)  Hepatitis C  (ICD-070.51) 10)  HIV Infection  (ICD-042)  Medications Prior to Update: 1)  Actos 30 Mg Tabs (Pioglitazone Hcl) 2)  Catapres 0.1 Mg Tabs (Clonidine Hcl) .... Take 1 Tablet By Mouth Two Times A Day 3)  Prozac 40 Mg Caps (Fluoxetine  Hcl) .... Take Once Daily 4)  Hydroxyzine Hcl 25 Mg Tabs (Hydroxyzine Hcl) .... One Four Times A Day 5)  Seroquel Xr 300 Mg Xr24h-Tab (Quetiapine Fumarate) .... Take Once Daily 6)  Percocet 5-325 Mg Tabs (Oxycodone-Acetaminophen) .... Take 1 Tab Every 6 Hours As Needed For Pain, Not To Exceed 3 Tablets Daily. 7)  Omeprazole 40 Mg Cpdr (Omeprazole) .... Take Once Daily 8)  Kaletra 200-50 Mg Tabs (Lopinavir-Ritonavir) .... Take 2 Tablets By Mouth Twice A Day 9)  Truvada 200-300 Mg Tabs (Emtricitabine-Tenofovir) .... Take One Daily 10)  Trazodone Hcl 100 Mg Tabs (Trazodone Hcl) .... Take 1 Tablet By Mouth At Bedtime 11)  Ibuprofen 600 Mg Tabs (Ibuprofen) .... Tke One As Needed 12)  Lisinopril 40 Mg Tabs (Lisinopril) .... Take 1 Tablet By Mouth Once A Day 13)  Ultram 50 Mg Tabs (Tramadol Hcl) .... Take 1 Tablet By Mouth Every 8 Hours As Needed 14)  Ensure  Liqd (Nutritional Supplements) .... Take One Three Times Daily  Current Medications (verified): 1)  Actos 30 Mg Tabs (Pioglitazone Hcl) 2)  Catapres 0.1 Mg Tabs (Clonidine Hcl) .... Take 1 Tablet By Mouth Two Times A Day 3)  Prozac 40 Mg Caps (Fluoxetine Hcl) .... Take Once Daily 4)  Hydroxyzine Hcl 25 Mg Tabs (Hydroxyzine  Hcl) .... One Four Times A Day 5)  Seroquel Xr 300 Mg Xr24h-Tab (Quetiapine Fumarate) .... Take Once Daily 6)  Percocet 5-325 Mg Tabs (Oxycodone-Acetaminophen) .... Take 1 Tab Every 6 Hours As Needed For Pain, Not To Exceed 3 Tablets Daily. 7)  Omeprazole 40 Mg Cpdr (Omeprazole) .... Take Once Daily 8)  Kaletra 200-50 Mg Tabs (Lopinavir-Ritonavir) .... Take 2 Tablets By Mouth Twice A Day 9)  Truvada 200-300 Mg Tabs (Emtricitabine-Tenofovir) .... Take One Daily 10)  Trazodone Hcl 100 Mg Tabs (Trazodone Hcl) .... Take 1 Tablet By Mouth At Bedtime 11)  Ibuprofen 600 Mg Tabs (Ibuprofen) .... Tke One As Needed 12)  Lisinopril 40 Mg Tabs (Lisinopril) .... Take 1 Tablet By Mouth Once A Day 13)  Ultram 50 Mg Tabs (Tramadol Hcl)  .... Take 1 Tablet By Mouth Every 8 Hours As Needed 14)  Ensure  Liqd (Nutritional Supplements) .... Take One Three Times Daily  Allergies (verified): No Known Drug Allergies  Past History:  Past Medical History: Last updated: 03/19/2009 Hypertension  Social History: Last updated: 04/08/2009 Currently unemployed, in process of getting disability Single Current Smoker- smokes 1 ppd Alcohol use-yes occasional bear   Risk Factors: Alcohol Use: few times weekly (04/08/2009) Caffeine Use: sodas (04/08/2009)  Risk Factors: Smoking Status: current (04/08/2009) Packs/Day: 1.0 (04/08/2009)  Social History: Reviewed history from 04/08/2009 and no changes required. Currently unemployed, in process of getting disability Single Current Smoker- smokes 1 ppd Alcohol use-yes occasional bear   Review of Systems       per HPI   Physical Exam  General:  Well-developed,well-nourished,in no acute distress; alert,appropriate and cooperative throughout examination Head:  Normocephalic and atraumatic without obvious abnormalities. No apparent alopecia or balding. Ears:  External ear exam shows no significant lesions or deformities.  Otoscopic examination reveals clear canals, tympanic membranes are intact bilaterally without bulging, retraction, inflammation or discharge. Hearing is grossly normal bilaterally. Nose:  External nasal examination shows no deformity or inflammation. Nasal mucosa are pink and moist without lesions or exudates. Mouth:  pharynx pink and moist and poor dentition, right lower wisdom tooth cracked Neck:  No deformities, masses, or tenderness noted. Lungs:  Normal respiratory effort, chest expands symmetrically. Lungs are clear to auscultation, no crackles or wheezes. Cervical Nodes:  No lymphadenopathy noted Psych:  Cognition and judgment appear intact. Alert and cooperative with normal attention span and concentration. No apparent delusions, illusions,  hallucinations   Impression & Recommendations:  Problem # 1:  UNSPECIFIED DISORDER TEETH&SUPPORTING STRUCTURES (ICD-525.9) She has dentist appointment on Apr 28th but I have rescheduled it for April 6th, at 1 pm with Dr. Barbette Merino. Will follow up on recs.  Problem # 2:  HYPERTENSION (ICD-401.9) Assessment: New secondary to noncompliance and she admits to it. Wants to start however, I have encouraged her to do so. willnot make any changes to this regimen.  Her updated medication list for this problem includes:    Catapres 0.1 Mg Tabs (Clonidine hcl) .Marland Kitchen... Take 1 tablet by mouth two times a day    Lisinopril 40 Mg Tabs (Lisinopril) .Marland Kitchen... Take 1 tablet by mouth once a day  Problem # 3:  DM (ICD-250.00) At goal, continue to monitor.  Her updated medication list for this problem includes:    Actos 30 Mg Tabs (Pioglitazone hcl)    Lisinopril 40 Mg Tabs (Lisinopril) .Marland Kitchen... Take 1 tablet by mouth once a day  Labs Reviewed: Creat: 1.28 (03/18/2009)    Reviewed HgBA1c results: 5.2 (03/18/2009)  Problem #  4:  EAR PAIN, BILATERAL (ICD-388.70)  still on abx, will perscribe ear drops to help with cerumen impaction.   Her updated medication list for this problem includes:    Ear Wax Drops 6.5 % Soln (Carbamide peroxide) ..... Use 1-2 drop daily in both ears  Complete Medication List: 1)  Actos 30 Mg Tabs (Pioglitazone hcl) 2)  Catapres 0.1 Mg Tabs (Clonidine hcl) .... Take 1 tablet by mouth two times a day 3)  Prozac 40 Mg Caps (Fluoxetine hcl) .... Take once daily 4)  Hydroxyzine Hcl 25 Mg Tabs (Hydroxyzine hcl) .... One four times a day 5)  Seroquel Xr 300 Mg Xr24h-tab (Quetiapine fumarate) .... Take once daily 6)  Percocet 5-325 Mg Tabs (Oxycodone-acetaminophen) .... Take 1 tab every 6 hours as needed for pain, not to exceed 3 tablets daily. 7)  Omeprazole 40 Mg Cpdr (Omeprazole) .... Take once daily 8)  Kaletra 200-50 Mg Tabs (Lopinavir-ritonavir) .... Take 2 tablets by mouth twice a day 9)   Truvada 200-300 Mg Tabs (Emtricitabine-tenofovir) .... Take one daily 10)  Trazodone Hcl 100 Mg Tabs (Trazodone hcl) .... Take 1 tablet by mouth at bedtime 11)  Ibuprofen 600 Mg Tabs (Ibuprofen) .... Tke one as needed 12)  Lisinopril 40 Mg Tabs (Lisinopril) .... Take 1 tablet by mouth once a day 13)  Ultram 50 Mg Tabs (Tramadol hcl) .... Take 1 tablet by mouth every 8 hours as needed 14)  Ensure Liqd (Nutritional supplements) .... Take one three times daily 15)  Ear Wax Drops 6.5 % Soln (Carbamide peroxide) .... Use 1-2 drop daily in both ears  Patient Instructions: 1)  Please schedule a follow-up appointment in 3 months. 2)  Please check your blood pressure regularly, if it is >170 please call clinic at [phone] 3)  Most patients (90%) with low back pain will improve with time (2-6 weeks). Keep active but avoid activities that are painful. Apply moist heat and/or ice to lower back several times a day. 4)  HbgA1C prior to visit, ICD-9: Prescriptions: EAR WAX DROPS 6.5 % SOLN (CARBAMIDE PEROXIDE) use 1-2 drop daily in both ears  #1 x 1   Entered and Authorized by:   Mliss Sax MD   Signed by:   Mliss Sax MD on 05/31/2009   Method used:   Print then Give to Patient   RxID:   [id number]   Prevention & Chronic Care Immunizations   Influenza vaccine: Not documented   Influenza vaccine deferral: Not indicated  (05/31/2009)    Tetanus booster: Not documented   Td booster deferral: Not available  (05/31/2009)    Pneumococcal vaccine: Not documented  Other Screening   Pap smear: Not documented   Pap smear action/deferral: Deferred-3 yr interval  (05/31/2009)    Mammogram: Not documented   Mammogram action/deferral: Refused  (05/31/2009)   Smoking status: current  (04/08/2009)   Smoking cessation counseling: yes  (04/08/2009)  Diabetes Mellitus   HgbA1C: 5.2  (03/18/2009)    Eye exam: Not documented   Diabetic eye exam action/deferral: Not indicated  (05/31/2009)     Foot exam: yes  (04/08/2009)   Foot exam action/deferral: Do today   High risk foot: Not documented   Foot care education: Not documented    Urine microalbumin/creatinine ratio: Not documented   Urine microalbumin action/deferral: Not indicated    Diabetes flowsheet reviewed?: Yes   Progress toward A1C goal: At goal  Lipids   Total Cholesterol: 166  (03/18/2009)   LDL: 72  (03/18/2009)  LDL Direct: Not documented   HDL: 64  (03/18/2009)   Triglycerides: 148  (03/18/2009)  Hypertension   Last Blood Pressure: 192 / 117  (05/31/2009)   Serum creatinine: 1.28  (03/18/2009)   Serum potassium 4.0  (03/18/2009)    Hypertension flowsheet reviewed?: Yes   Progress toward BP goal: Unchanged  Self-Management Support :   Personal Goals (by the next clinic visit) :     Personal A1C goal: 7  (04/08/2009)     Personal blood pressure goal: 130/80  (04/08/2009)   Patient will work on the following items until the next clinic visit to reach self-care goals:     Medications and monitoring: take my medicines every day, bring all of my medications to every visit  (05/31/2009)     Eating: eat more vegetables, eat foods that are low in salt  (05/31/2009)    Diabetes self-management support: Education handout, Resources for patients handout, Written self-care plan  (05/31/2009)   Diabetes care plan printed   Diabetes education handout printed   Last diabetes self-management training by diabetes educator: 04/08/2009    Hypertension self-management support: Education handout, Resources for patients handout, Written self-care plan  (05/31/2009)   Hypertension self-care plan printed.   Hypertension education handout printed      Resource handout printed.

## 2010-03-31 NOTE — Progress Notes (Signed)
----   Converted from flag ---- ---- 04/08/2009 4:49 PM, Jamison Neighbor RD,CDE wrote: patient requested an Rx for ensure twice a day says [facility] asked her to ask [place]. ? can you help em wiht this?   Lupita Leash ------------------------------  Script given for Ensure  Laurell Josephs ,RN   Appended Document:  Script sent to [facility], [facility] via fax Laurell Josephs, RN III

## 2010-03-31 NOTE — Letter (Signed)
Summary: REFERRAL APPT.  REFERRAL APPT.   Imported By: Margie Billet 07/09/2009 10:07:23  _____________________________________________________________________  External Attachment:    Type:   Image     Comment:   External Document

## 2010-03-31 NOTE — Progress Notes (Signed)
Summary: refill request  Phone Note Refill Request Message from:  Fax from Pharmacy on August 16, 2009 4:08 PM  Refills Requested: Medication #1:  PATANOL 0.1 % SOLN one drop two times a day   Last Refilled: 07/05/2009 this refill is for the next month if approved.   Method Requested: Telephone to Pharmacy Next Appointment Scheduled: September 08, 2009 Initial call taken by: Paulo Fruit  BS,CPht II,MPH,  August 16, 2009 4:09 PM [facility]: [facility] Reason for Call: Needs renewal  Follow-up for Phone Call        ok x 1 Follow-up by: Yisroel Ramming MD,  August 17, 2009 2:12 PM    Prescriptions: PATANOL 0.1 % SOLN (OLOPATADINE HCL) one drop two times a day  #10 ml x 0   Entered by:   Paulo Fruit  BS,CPht II,MPH   Authorized by:   Yisroel Ramming MD   Signed by:   Paulo Fruit  BS,CPht II,MPH on 08/17/2009   Method used:   Telephoned to ...       [facility] [employer] (mail-order)       [address] [place], [place]  [address]       Ph: [phone]       Fax: [phone]   RxID:   [id number]  Paulo Fruit  BS,CPht II,MPH  August 17, 2009 2:31 PM

## 2010-03-31 NOTE — Medication Information (Signed)
Summary: [facility]   Imported By: Margie Billet 07/29/2009 13:49:13  _____________________________________________________________________  External Attachment:    Type:   Image     Comment:   External Document

## 2010-03-31 NOTE — Miscellaneous (Signed)
Summary: Bridge Counselor  Clinical Lists Changes [facility] transported pt to [facility] this date for her lab work. BC also made a referral to [employer] [facility]. [facility] is an intensive community support team that deals with mental health, substance abuse and behavior issues. Pt has agreed to attend the assessment that is schduled for 6/21 at 10:30am. [facility] will follow up and make sure that pt attends the appointment.  Sharol Roussel  August 18, 2009 9:48 AM

## 2010-03-31 NOTE — Progress Notes (Signed)
Summary: Refill/gh  Phone Note Refill Request Message from:  Fax from Pharmacy on June 01, 2009 10:52 AM  Refills Requested: Medication #1:  ACTOS 30 MG TABS  Medication #2:  PROZAC 40 MG CAPS take once daily  Medication #3:  PERCOCET 5-325 MG TABS Take 1 tab every 6 hours as needed for pain  Medication #4:  CATAPRES 0.1 MG TABS Take 1 tablet by mouth two times a day Pick up Percocet   Method Requested: Electronic Initial call taken by: Angelina Ok RN,  June 01, 2009 10:53 AM  Follow-up for Phone Call        completed except percocet, I will route this to Dr Baltazar Apo for approval, also for Actos I am not sure on the exact dosing so will rout this to her as well for further management thank you Follow-up by: Mliss Sax MD,  June 01, 2009 2:59 PM    Prescriptions: TRAZODONE HCL 100 MG TABS (TRAZODONE HCL) Take 1 tablet by mouth at bedtime  #30 x 2   Entered by:   Mliss Sax MD   Authorized by:   Melida Quitter MD   Signed by:   Mliss Sax MD on 06/01/2009   Method used:   Electronically to        [facility] [address] [phone]* (retail)       [address]       [place], [place]  [address]       Ph: [phone] or [phone]       Fax: [phone]   RxID:   [id number] LISINOPRIL 40 MG TABS (LISINOPRIL) Take 1 tablet by mouth once a day  #30 x 3   Entered by:   Mliss Sax MD   Authorized by:   Melida Quitter MD   Signed by:   Mliss Sax MD on 06/01/2009   Method used:   Electronically to        [facility] [address] [phone]* (retail)       [address]       [place], [place]  [address]       Ph: [phone] or [phone]       Fax: [phone]   RxID:   [id number] OMEPRAZOLE 40 MG CPDR (OMEPRAZOLE) take once daily  #30 x 3   Entered by:   Mliss Sax MD   Authorized by:   Melida Quitter MD   Signed by:   Mliss Sax MD on 06/01/2009   Method used:   Electronically to        [facility] [address] [phone]* (retail)       [address]       [place], [place]  [address]       Ph: [phone] or [phone]       Fax: [phone]   RxID:   [id number] SEROQUEL XR 300 MG XR24H-TAB (QUETIAPINE FUMARATE) take once daily  #30 x 2   Entered by:   Mliss Sax MD   Authorized by:   Melida Quitter MD   Signed by:   Mliss Sax MD on 06/01/2009   Method used:   Electronically to        [facility] [address] [phone]* (retail)       [address]       [place], [place]  [address]       Ph: [phone] or [phone]       Fax: [phone]  RxID:   [id number] HYDROXYZINE HCL 25 MG TABS (HYDROXYZINE HCL) one four times a day  #120 x 0   Entered by:   Mliss Sax MD   Authorized by:   Melida Quitter MD   Signed by:   Mliss Sax MD on 06/01/2009   Method used:   [facility]ronically to        [facility] [address] [phone]* (retail)       [address]       [place], [place]  [address]       Ph: [phone] or [phone]       Fax: [phone]   RxID:   [id number] PROZAC 40 MG CAPS (FLUOXETINE HCL) take once daily  #30 x 2   Entered by:   Mliss Sax MD   Authorized by:   Melida Quitter MD   Signed by:   Mliss Sax MD on 06/01/2009   Method used:   Electronically to        [facility] [address] [phone]* (retail)       [address]       [place], [place]  [address]       Ph: [phone] or [phone]       Fax: [phone]   RxID:   [id number] CATAPRES 0.1 MG TABS (CLONIDINE HCL) Take 1 tablet by mouth two times a day  #60 x 2   Entered by:   Mliss Sax MD   Authorized by:   Melida Quitter MD   Signed by:   Mliss Sax MD on 06/01/2009   Method used:   [facility]ronically [facility]        [facility] [address] [phone]* (retail)       [address]       [place], [place]  [address]       Ph: [phone] or [phone]       Fax: [phone]   RxID:   [id number]

## 2010-03-31 NOTE — Miscellaneous (Signed)
Summary: Orders Update  Clinical Lists Changes  Orders: Added new Test order of T-CBC w/Diff [phone]) - Signed Added new Test order of T-CD4SP [facility]) (CD4SP) - Signed Added new Test order of T-Comprehensive Metabolic Panel [phone]) - Signed Added new Test order of T-HIV Viral Load [phone]) - Signed     Process Orders Check Orders Results:     [facility]: ABN not required for this insurance Tests Sent for requisitioning (December 17, 2009 10:52 AM):     12/17/2009: [facility] -- T-CBC w/Diff [[id number]] (signed)     12/17/2009: [facility] -- T-Comprehensive Metabolic Panel [[id number]] (signed)     12/17/2009: [facility] -- T-HIV Viral Load [phone] (signed)

## 2010-03-31 NOTE — Miscellaneous (Signed)
Summary: Bridge Counselor  Clinical Lists Changes BC transported ct to [facility] for her scheduled appointment with Dr. Baltazar Apo. Pt was met by Raynaldo Opitz and dismissed from [facility] due to her behvior and substance abuse. Pt has already called DSS and changed her primary care to [facility] on her medicaid card. BC called [facility] to get pt an appointment, but pt cannot be seen until she has an ID. Pt to get an ID and then [facility] will schedule an appointment. BC then made a home visit to deliver pt's medications that were delivered by [facility].  Sharol Roussel  July 30, 2009 12:31 PM

## 2010-03-31 NOTE — Progress Notes (Signed)
Summary: [facility] dismissal  Administrative note Certified dismissal letter returned to [facility].  I met with patient today and her case Production designer, theatre/television/film.  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  I was told she was being scheduled with another doctor by case manager. Raynaldo Opitz Director

## 2010-03-31 NOTE — Assessment & Plan Note (Signed)
Summary: PAIN/DM/OK PER TAMMY/VS   CC:  Pain/RX for meds and possbility of a cane/needs a meter for DM.  History of Present Illness: Pt had intake done yesterday and stated that she was out of medications and in pain so she was scheduled for an acute appt today. She states that she was diagnosed HIV (+) in 2007.  She has been in care at [facility]. She moved here several months ago and she has been out of all of he medications for 9 months.  She is currently staying at a shelter and has applied for Medicaid and ADAP but has neither at this time.  She c/o back pain and neck pain and states that she has a herniated disc. She also has knee pain. She states that she was diagnosed with DM in 2007 and has been on Actos which she has been without for 9 months.  Her BS here was nl as was her HgbA1c. I do not have nay medical records at this time. She states that [facility] will help pay for her Rxs.  Preventive Screening-Counseling & Management  Alcohol-Tobacco     Alcohol drinks/day: few times weekly     Smoking Status: current     Packs/Day: 1.0  Caffeine-Diet-Exercise     Caffeine use/day: sodas     Type of exercise: walking     Exercise (avg: min/session): >60     Times/week: 7  Safety-Violence-Falls     Seat Belt Use: yes   Current Allergies (reviewed today): No known allergies  Past History:  Past Medical History: Hypertension  Review of Systems  The patient denies anorexia, fever, weight loss, chest pain, and headaches.    Vital Signs:  Patient profile:   45 year old female Height:      59 inches (149.86 cm) Weight:      123.5 pounds (56.14 kg) BMI:     25.03 Temp:     97.2 degrees F (36.22 degrees C) oral Pulse rate:   80 / minute BP sitting:   142 / 90  Vitals Entered By: Kathi Simpers CMA(AAMA) (March 19, 2009 2:00 PM) CC: Pain/RX for meds and possbility of a cane/needs a meter for DM Is Patient Diabetic? Yes Did you bring your meter with you today? No Pain  Assessment Patient in pain? yes     Location: lower back Intensity: 10 Type: sharp Onset of pain  Constant Nutritional Status BMI of 25 - 29 = overweight Nutritional Status Detail appetite is not good per patient  Does patient need assistance? Functional Status Self care Ambulation Normal   Physical Exam  General:  alert, well-developed, well-nourished, and well-hydrated.   Head:  normocephalic and atraumatic.   Mouth:  pharynx pink and moist.  no thrush  Lungs:  normal breath sounds.   Heart:  normal rate and regular rhythm.     Impression & Recommendations:  Problem # 1:  HIV INFECTION (ICD-042) labs pending.  Unable to get her Truvada and Kaletra until she is approved for Medicaid or ADAP. She will return in 2 weeks.  Obtain old records. Diagnostics Reviewed:   Problem # 2:  DM (ICD-250.00) BS nl and HgbA1c nl.  Will hold on treatment for now.  Will refer to DM educator for glucometer. Obtain old records. The following medications were removed from the medication list:    Benicar Hct 40-25 Mg Tabs (Olmesartan medoxomil-hctz) ..... Once daily Her updated medication list for this problem includes:    Actos 30 Mg Tabs (  Pioglitazone hcl)    Lisinopril 20 Mg Tabs (Lisinopril) .Marland Kitchen... Take 1 tablet by mouth once a day  Orders: New Patient Level III ([address])  Reviewed HgBA1c results: 5.2 (03/18/2009)  Problem # 3:  HYPERTENSION (ICD-401.9) Will change to lisinopril which is available through 4 dollar Rx plan.  [facility] to help her obtain this. The following medications were removed from the medication list:    Benicar Hct 40-25 Mg Tabs (Olmesartan medoxomil-hctz) ..... Once daily Her updated medication list for this problem includes:    Catapres 0.1 Mg Tabs (Clonidine hcl)    Lisinopril 20 Mg Tabs (Lisinopril) .Marland Kitchen... Take 1 tablet by mouth once a day  Problem # 4:  BACK PAIN, CHRONIC (ICD-724.5) need old records ultram/ibuprofen for now.  These are available on 4 dollar Rx  plan. Her updated medication list for this problem includes:        Ibuprofen 600 Mg Tabs (Ibuprofen) .Marland Kitchen... Tke one as needed    Ultram 50 Mg Tabs (Tramadol hcl) .Marland Kitchen... Take 1 tablet by mouth every 8 hours as needed  Problem # 5:  SCHIZOPHRENIA (ICD-295.90) refer to [facility].  Medications Added to Medication List This Visit: 1)  Trazodone Hcl 100 Mg Tabs (Trazodone hcl) .... Take 1 tablet by mouth at bedtime 2)  Lisinopril 20 Mg Tabs (Lisinopril) .... Take 1 tablet by mouth once a day 3)  Ultram 50 Mg Tabs (Tramadol hcl) .... Take 1 tablet by mouth every 8 hours as needed  Patient Instructions: 1)  Please establish patient in IM clinic for DM care 2)  Please schedule with DM educator 3)  Follow-up as scheduled with me Prescriptions: ULTRAM 50 MG TABS (TRAMADOL HCL) Take 1 tablet by mouth every 8 hours as needed  #60 x 0   Entered and Authorized by:   Yisroel Ramming MD   Signed by:   Yisroel Ramming MD on 03/19/2009   Method used:   Print then Give to Patient   RxID:   [id number] LISINOPRIL 20 MG TABS (LISINOPRIL) Take 1 tablet by mouth once a day  #30 x 0   Entered and Authorized by:   Yisroel Ramming MD   Signed by:   Yisroel Ramming MD on 03/19/2009   Method used:   Print then Give to Patient   RxID:   [id number] IBUPROFEN 600 MG TABS (IBUPROFEN) tke one as needed  #90 x 0   Entered and Authorized by:   Yisroel Ramming MD   Signed by:   Yisroel Ramming MD on 03/19/2009   Method used:   Print then Give to Patient   RxID:   [id number] TRAZODONE HCL 100 MG TABS (TRAZODONE HCL) Take 1 tablet by mouth at bedtime  #30 x 0   Entered and Authorized by:   Yisroel Ramming MD   Signed by:   Yisroel Ramming MD on 03/19/2009   Method used:   Print then Give to Patient   RxID:   [id number]

## 2010-03-31 NOTE — Miscellaneous (Signed)
Summary: [facility]: Records  [facility]: Records   Imported By: Florinda Marker 03/15/2009 14:46:41  _____________________________________________________________________  External Attachment:    Type:   Image     Comment:   External Document

## 2010-03-31 NOTE — Miscellaneous (Signed)
Summary: [facility]  [place]   Imported By: Florinda Marker 07/20/2009 16:19:00  _____________________________________________________________________  External Attachment:    Type:   Image     Comment:   External Document
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
## 2010-03-31 NOTE — Miscellaneous (Signed)
Summary: Bridge Counselor  Clinical Lists Changes Pt enrolled into [facility] program on 06/09/09. BC scheduled and transported pt to her 06/11/09 appointment with Dr. Philipp Deputy. Pt's blood pressure was high this date. BC assisted pt with obtaining her blood pressure medication this date at Dr. Danella Deis request. [facility] also contacted DSS to have pt's PCP changed on her Medicaid card so that [facility] and [facility] could make referrals for pt. BC transported pt to her [facility] appointment on 06/16/09 where pt had a breakdown due to not being given percocet. Pt violated her contract on will be required to attend [facility] and pass multiple UDS before Dr. Baltazar Apo will consider giving pt narcotics again. BC received a phone call from [facility] on 06/24/09 confirming pt's enrollment. Pt should get all meds delivered through [facility] from now on. BC received an email from [place] [facility] [employer] on 06/28/09 that pt admitted to smoking marijuana and lost her temper. Pt is not allowed at [facility] any longer unless pt agrees to SA Tx. Pt was linked with [facility] after refusing inpatient Tx. Pt had her intake with [facility] on 06/29/09 and began her first session on 06/30/09. BC transported pt to her appointment with Dr. Philipp Deputy on 07/01/09 where her BP was up again. Pt also admits to "being high" this date. Pt is convinced that she has figured out a way to pass her UDS and thinks she will get percocet at her 07/07/09 appointment at [facility]. BC transported pt to [facility] on 07/06/09 to apply for disability. Pt was agitated and kept asking BC and the [employer] rep for money to buy food and medications. BC asked pt why her medications were at [facility] and not coming from [facility]. Pt declines to tell BC what medications are there. BC suspects that pt has narcotics. Pt told BC about a walk in clinic on [address] that will "give anyone pain pills." BC tried to encourage pt to be compliant with [facility] requests so that she can have her pain contract reinstated.  Pt  upset with BC this date as BC will not give pt money. Pt also admits to [facility] that she has not been back to [facility] since 06/30/09.  Selena Batten [facility]  Jul 09, 2009 2:55 PM
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
## 2010-03-31 NOTE — Assessment & Plan Note (Signed)
Summary: PER DR GOLDING TO SEE HER/CH   Vital Signs:  Patient profile:   45 year old female Height:      59 inches (149.86 cm) Weight:      113.3 pounds (51.50 kg) BMI:     22.97 Temp:     97.0 degrees F (36.11 degrees C) oral Pulse rate:   78 / minute BP sitting:   141 / 86  (right arm)  Vitals Entered By: Stanton Kidney Ditzler RN (Jul 07, 2009 1:39 PM) Is Patient Diabetic? No Pain Assessment Patient in pain? yes     Location: back Intensity: 10 Type: sharp Onset of pain  long time Nutritional Status BMI of 19 -24 = normal Nutritional Status Detail appetite good  Have you ever been in a relationship where you felt threatened, hurt or afraid?denies   Does patient need assistance? Functional Status Self care Ambulation Normal Comments Needs pain med and discuss doage.   Primary Care Provider:  Melida Quitter MD   History of Present Illness: 45 year old Past Medical History: Hypertension Diabetes mellitus, type II HIV disease  Patient here today for lab result and requesting percocet. I inform to patient that the  UDS done last week was positive for cocaine. I explain to her again for 3th time that we cant prescribe her percocet while she is using cocaine due to safety and health reason.  I explain also to her that she need to have multiples UDS negative and assist  to the drug rehab for [place] to be able to prescribe her percocet. She was upset, because I  wont prescribe percocet.   Depression History:      The patient denies a depressed mood most of the day and a diminished interest in her usual daily activities.         Preventive Screening-Counseling & Management  Alcohol-Tobacco     Alcohol drinks/day: few times weekly     Alcohol type: beer     Smoking Status: current     Smoking Cessation Counseling: yes     Packs/Day: 1.0     Year Started: started 12-13 yrs ago  Caffeine-Diet-Exercise     Caffeine use/day: sodas     Type of exercise: walking     Exercise (avg:  min/session): >60     Times/week: 7  Current Medications (verified): 1)  Actos 30 Mg Tabs (Pioglitazone Hcl) 2)  Prozac 40 Mg Caps (Fluoxetine Hcl) .... Take Once Daily 3)  Hydroxyzine Hcl 25 Mg Tabs (Hydroxyzine Hcl) .... One Four Times A Day 4)  Seroquel Xr 300 Mg Xr24h-Tab (Quetiapine Fumarate) .... Take Once Daily 5)  Percocet 5-325 Mg Tabs (Oxycodone-Acetaminophen) .... Take 1 Tab Every 6 Hours As Needed For Pain, Not To Exceed 3 Tablets Daily. 6)  Omeprazole 40 Mg Cpdr (Omeprazole) .... Take Once Daily 7)  Kaletra 200-50 Mg Tabs (Lopinavir-Ritonavir) .... Take 2 Tablets By Mouth Twice A Day 8)  Truvada 200-300 Mg Tabs (Emtricitabine-Tenofovir) .... Take One Daily 9)  Trazodone Hcl 100 Mg Tabs (Trazodone Hcl) .... Take 1 Tablet By Mouth At Bedtime 10)  Ibuprofen 600 Mg Tabs (Ibuprofen) .... Tke One As Needed 11)  Lisinopril 40 Mg Tabs (Lisinopril) .... Take 1 Tablet By Mouth Once A Day 12)  Ultram 50 Mg Tabs (Tramadol Hcl) .... Take 1 Tablet By Mouth Every 8 Hours As Needed 13)  Ensure  Liqd (Nutritional Supplements) .... Take One Three Times Daily 14)  Ear Wax Drops 6.5 % Soln (Carbamide Peroxide) .Marland KitchenMarland KitchenMarland Kitchen  Use 1-2 Drop Daily in Both Ears 15)  Prodigy Twist Top Lancets 28g  Misc (Lancets) .... Use To Check Blood Sugar A Few Times A Week 16)  Prodigy Blood Glucose Test  Strp (Glucose Blood) .... Use To Check Blood Sugar A Few Timnes A Week or When You Feel Blood Sugar May Be High (Increased Thirst, Peeing,hunger, Tiredness) 17)  Gabapentin 300 Mg Caps (Gabapentin) .... Take 1 Tablet By Mouth Two Times A Day 18)  Etodolac 300 Mg Caps (Etodolac) .... Take 1 Tab Every 8 Hours As Needed For Pain. 19)  Patanol 0.1 % Soln (Olopatadine Hcl) .... One Drop Two Times A Day 20)  Clonidine Hcl 0.2 Mg Tabs (Clonidine Hcl) .... Take 1 Tablet By Mouth Two Times A Day  Allergies: No Known Drug Allergies   Impression & Recommendations:  Problem # 1:  DRUG ABUSE (ICD-305.90) Please read Dr Baltazar Apo and my  note from prior visit. Please read HPI. Patient refused to have UDS done. Dr Agapito Games spoke with patient also. No percocet at this time.  Orders: T-Drug Screen-Urine, (single) [phone])  Complete Medication List: 1)  Actos 30 Mg Tabs (Pioglitazone hcl) 2)  Prozac 40 Mg Caps (Fluoxetine hcl) .... Take once daily 3)  Hydroxyzine Hcl 25 Mg Tabs (Hydroxyzine hcl) .... One four times a day 4)  Seroquel Xr 300 Mg Xr24h-tab (Quetiapine fumarate) .... Take once daily 5)  Percocet 5-325 Mg Tabs (Oxycodone-acetaminophen) .... Take 1 tab every 6 hours as needed for pain, not to exceed 3 tablets daily. 6)  Omeprazole 40 Mg Cpdr (Omeprazole) .... Take once daily 7)  Kaletra 200-50 Mg Tabs (Lopinavir-ritonavir) .... Take 2 tablets by mouth twice a day 8)  Truvada 200-300 Mg Tabs (Emtricitabine-tenofovir) .... Take one daily 9)  Trazodone Hcl 100 Mg Tabs (Trazodone hcl) .... Take 1 tablet by mouth at bedtime 10)  Ibuprofen 600 Mg Tabs (Ibuprofen) .... Tke one as needed 11)  Lisinopril 40 Mg Tabs (Lisinopril) .... Take 1 tablet by mouth once a day 12)  Ultram 50 Mg Tabs (Tramadol hcl) .... Take 1 tablet by mouth every 8 hours as needed 13)  Ensure Liqd (Nutritional supplements) .... Take one three times daily 14)  Ear Wax Drops 6.5 % Soln (Carbamide peroxide) .... Use 1-2 drop daily in both ears 15)  Prodigy Twist Top Lancets 28g Misc (Lancets) .... Use to check blood sugar a few times a week 16)  Prodigy Blood Glucose Test Strp (Glucose blood) .... Use to check blood sugar a few timnes a week or when you feel blood sugar may be high (increased thirst, peeing,hunger, tiredness) 17)  Gabapentin 300 Mg Caps (Gabapentin) .... Take 1 tablet by mouth two times a day 18)  Etodolac 300 Mg Caps (Etodolac) .... Take 1 tab every 8 hours as needed for pain. 19)  Patanol 0.1 % Soln (Olopatadine hcl) .... One drop two times a day 20)  Clonidine Hcl 0.2 Mg Tabs (Clonidine hcl) .... Take 1 tablet by mouth two times a  day  Patient Instructions: 1)  Please schedule a follow-up appointment in 3 weeks.  Prevention & Chronic Care Immunizations   Influenza vaccine: Not documented   Influenza vaccine deferral: Not indicated  (05/31/2009)    Tetanus booster: Not documented   Td booster deferral: Not available  (05/31/2009)    Pneumococcal vaccine: Pneumovax  (06/11/2009)  Other Screening   Pap smear: Not documented   Pap smear action/deferral: Deferred-3 yr interval  (05/31/2009)    Mammogram: Not  documented   Mammogram action/deferral: Refused  (05/31/2009)   Smoking status: current  (07/07/2009)   Smoking cessation counseling: yes  (07/07/2009)  Diabetes Mellitus   HgbA1C: 5.2  (06/16/2009)    Eye exam: Not documented   Diabetic eye exam action/deferral: Not indicated  (05/31/2009)    Foot exam: yes  (04/08/2009)   Foot exam action/deferral: Do today   High risk foot: Not documented   Foot care education: Not documented    Urine microalbumin/creatinine ratio: Not documented   Urine microalbumin action/deferral: Not indicated  Lipids   Total Cholesterol: 166  (03/18/2009)   LDL: 72  (03/18/2009)   LDL Direct: Not documented   HDL: 64  (03/18/2009)   Triglycerides: 148  (03/18/2009)  Hypertension   Last Blood Pressure: 141 / 86  (07/07/2009)   Serum creatinine: 1.16  (06/11/2009)   Serum potassium 4.5  (06/11/2009)  Self-Management Support :   Personal Goals (by the next clinic visit) :     Personal A1C goal: 7  (06/16/2009)     Personal blood pressure goal: 130/80  (06/16/2009)     Personal LDL goal: 70  (06/16/2009)    Diabetes self-management support: Written self-care plan, Education handout, Resources for patients handout  (06/30/2009)   Last diabetes self-management training by diabetes educator: 05/31/2009    Hypertension self-management support: Written self-care plan, Education handout, Resources for patients handout  (06/30/2009)  Process Orders Check Orders  Results:     [facility]: ABN not required for this insurance Tests Sent for requisitioning (Jul 07, 2009 7:18 PM):     07/07/2009: [facility] -- T-Drug Screen-Urine, (single) [[id number]] (signed)    Process Orders Check Orders Results:     [facility]: ABN not required for this insurance Tests Sent for requisitioning (Jul 07, 2009 7:18 PM):     07/07/2009: [facility] -- T-Drug Screen-Urine, (single) [[id number]] (signed)   Appended Document: PER DR Phillips Odor TO SEE HER/CH Patient continues to abuse cocaine. No prescription given for pain meds. I offered her continued support for substance abuse and a visit next week for repeat UDS. Unfortunately I do not think Ms. San can quit using cocaine-due to addiction given the multiple opportunities we have given her to stop using- I told her we would be happy to treat her pain once she is illicit drug free. Patient yelled profanities at clinic administrator, staff and physicians. I am recommending that she be discharged from the practice given her belligerent and threatening behavior on two seperate occasions.

## 2010-03-31 NOTE — Progress Notes (Signed)
Summary: refills/ hla  Phone Note Refill Request Message from:  Patient on June 01, 2009 3:31 PM  Refills Requested: Medication #1:  ACTOS 30 MG TABS  Medication #2:  PERCOCET 5-325 MG TABS Take 1 tab every 6 hours as needed for pain   Dosage confirmed as above?Dosage Confirmed   Last Refilled: 04/08/2009 called pharm actos last filled at [facility] 12/2008 but it was then transf to a different pharm, they do not have a record of where pt is here now, wanting percocet, pharm -[facility]- states last filled there 04/08/09 PLEASE ADVISE, THANK  YOU  Initial call taken by: Marin Roberts RN,  June 01, 2009 3:36 PM  Follow-up for Phone Call        On 2/10 pt complained to Dr Baltazar Apo that her vicodin was not helping the pain.  Dr Baltazar Apo asked pt to sign narcotic contract, check UDS, and gave her #90 of percocet.  Pts UDS was negative for opiates (was supposed to be on vicodin) but was + for cocaine and marijuana.  I cannot refill her Percocet as this is clear violation of her contract.  Pt on narcs for LBP and leg pain.  appearently has spinal stenosis but I don't have original MRI report to support this.    Today, no refill. UDS.  Pt to see MD tomorrow.   Myriam Jacobson told this to pt.  Pt admitted to doing cocaine again day before yesterday.  She was not going to give [place] urine.  She wanted to transfer care to another doctor.   Follow-up by: Blanch Media MD,  June 01, 2009 3:47 PM  Additional Follow-up for Phone Call Additional follow up Details #1::        i spoke w/ pt and explained that she would need to have a urine drug screen and that percocet would not be prescribed today, that she would need to schedule an appt and discuss the use of street drugs w/ her pcp. she is not happy w/ this answer, i explained the dilemma that the imc would be in knowingly prescribing percocet when her urine had street drugs in it, she states she will not submit a urine today, states she used crack cocaine 05/30/09 and will do so  next opportunity available, she also requests her medical records and will seek medical care somewhere else. i informed dr Midwife and jim shaw. Additional Follow-up by: Marin Roberts RN,  June 01, 2009 4:00 PM

## 2010-03-31 NOTE — Progress Notes (Signed)
  Phone Note From Other Clinic   Caller: [facility] Summary of Call: Nurse called stating that the patients blood pressure was too high to perform stress test today, and wanted her to follow up on her blood pressure tomorrow at her appt with Dr. Philipp Deputy. Initial call taken by: Starleen Arms CMA,  September 07, 2009 10:12 AM    S

## 2010-03-31 NOTE — Progress Notes (Signed)
Summary: [facility] medications arrived for Apr  Phone Note Refill Request      Prescriptions: PRODIGY BLOOD GLUCOSE TEST  STRP (GLUCOSE BLOOD) use to check blood sugar a few timnes a week or when you feel blood sugar may be high (increased thirst, peeing,hunger, tiredness)  #50 x 0   Entered by:   Paulo Fruit  BS,CPht II,MPH   Authorized by:   Yisroel Ramming MD   Signed by:   Paulo Fruit  BS,CPht II,MPH on 06/24/2009   Method used:   Samples Given   RxID:   [id number] PRODIGY TWIST TOP LANCETS 28G  MISC (LANCETS) use to check blood sugar a few times a week  #100 x 0   Entered by:   Paulo Fruit  BS,CPht II,MPH   Authorized by:   Yisroel Ramming MD   Signed by:   Paulo Fruit  BS,CPht II,MPH on 06/24/2009   Method used:   Samples Given   RxID:   [id number] KALETRA 200-50 MG TABS (LOPINAVIR-RITONAVIR) Take 2 tablets by mouth twice a day  #120 x 0   Entered by:   Paulo Fruit  BS,CPht II,MPH   Authorized by:   Yisroel Ramming MD   Signed by:   Paulo Fruit  BS,CPht II,MPH on 06/24/2009   Method used:   Samples Given   RxID:   [id number] ACTOS 30 MG TABS (PIOGLITAZONE HCL)   #30 x 0   Entered by:   Paulo Fruit  BS,CPht II,MPH   Authorized by:   Yisroel Ramming MD   Signed by:   Paulo Fruit  BS,CPht II,MPH on 06/24/2009   Method used:   Samples Given   RxID:   [id number] CATAPRES 0.1 MG TABS (CLONIDINE HCL) Take 1 tablet by mouth two times a day  #60 x 0   Entered by:   Paulo Fruit  BS,CPht II,MPH   Authorized by:   Yisroel Ramming MD   Signed by:   Paulo Fruit  BS,CPht II,MPH on 06/24/2009   Method used:   Samples Given   RxID:   [id number] HYDROXYZINE HCL 25 MG TABS (HYDROXYZINE HCL) one four times a day  #120 x 0   Entered by:   Paulo Fruit  BS,CPht II,MPH   Authorized by:   Yisroel Ramming MD   Signed by:   Paulo Fruit  BS,CPht II,MPH on 06/24/2009   Method used:   Samples Given   RxID:   [id number] PROZAC 40 MG CAPS (FLUOXETINE HCL) take  once daily  #30 x 0   Entered by:   Paulo Fruit  BS,CPht II,MPH   Authorized by:   Yisroel Ramming MD   Signed by:   Paulo Fruit  BS,CPht II,MPH on 06/24/2009   Method used:   Samples Given   RxID:   [id number] LISINOPRIL 40 MG TABS (LISINOPRIL) Take 1 tablet by mouth once a day  #30 x 0   Entered by:   Paulo Fruit  BS,CPht II,MPH   Authorized by:   Yisroel Ramming MD   Signed by:   Paulo Fruit  BS,CPht II,MPH on 06/24/2009   Method used:   Samples Given   RxID:   [id number] SEROQUEL XR 300 MG XR24H-TAB (QUETIAPINE FUMARATE) take once daily  #30 x 0   Entered by:   Paulo Fruit  BS,CPht II,MPH   Authorized by:   Yisroel Ramming MD   Signed by:   Paulo Fruit  BS,CPht II,MPH on 06/24/2009   Method used:   Samples Given  RxID:   [id number] TRAZODONE HCL 100 MG TABS (TRAZODONE HCL) Take 1 tablet by mouth at bedtime  #30 x 0   Entered by:   Paulo Fruit  BS,CPht II,MPH   Authorized by:   Yisroel Ramming MD   Signed by:   Paulo Fruit  BS,CPht II,MPH on 06/24/2009   Method used:   Samples Given   RxID:   [id number] TRUVADA 200-300 MG TABS (EMTRICITABINE-TENOFOVIR) take one daily  #30 x 0   Entered by:   Paulo Fruit  BS,CPht II,MPH   Authorized by:   Yisroel Ramming MD   Signed by:   Paulo Fruit  BS,CPht II,MPH on 06/24/2009   Method used:   Samples Given   RxID:   [id number] OMEPRAZOLE 40 MG CPDR (OMEPRAZOLE) take once daily  #30 x 0   Entered by:   Paulo Fruit  BS,CPht II,MPH   Authorized by:   Yisroel Ramming MD   Signed by:   Paulo Fruit  BS,CPht II,MPH on 06/24/2009   Method used:   Samples Given   RxID:   [id number]  Patient Assist Medication Verification: Medication name: Omeprazole 40mg  (Dr. Baltazar Apo) RX # [phone] Tech approval:MLD  Patient Assist Medication Verification: Medication name:Seroquel XR 300mg  (Dr. Baltazar Apo) RX # [phone] Tech approval:MLD  Patient Assist Medication Verification: Medication name:Truvada (Dr. Philipp Deputy) RX #  [phone] Tech approval:MLD  Patient Assist Medication Verification: Medication name:Trazodone HCL 100mg  (Dr. Baltazar Apo) RX # [phone] Tech approval:MLD  Patient Assist Medication Verification: Medication name:Lisinopril 40mg  (Dr. Baltazar Apo) RX # [phone] Tech approval:MLD  Patient Assist Medication Verification: Medication name: Actos 30mg  (Dr. Casimiro Needle) RX # [phone] Tech approval:MLD  Patient Assist Medication Verification: Medication name: Hydrocodone/APAP 5/500mg  (Dr. Benson Norway) RX # [phone] Tech approval: MLD  Patient Assist Medication Verification: Medication name:Fluoxetine HCL 40mg  (Dr. Baltazar Apo) RX # [phone] Tech approval:MLD  Patient Assist Medication Verification: Medication name:Hydroxyzine HCL 25mg  (Dr. Baltazar Apo) RX # [phone] Tech approval:MLD  Patient Assist Medication Verification: Medication name:clonidine HCL 0.1mg  (Dr. Baltazar Apo) RX # [phone] Tech approval:MLD  Patient Assist Medication Verification: Medication name: Kaletra 200/50mg  (Dr. Philipp Deputy) RX # [phone] Tech approval:MLD  Patient Assist Medication Verification: Medication name: Prodigy Lancets (Dr. Baltazar Apo) RX # [id number] Tech approval:MLD  Patient Assist Medication Verification: Medication name:Prodigy Strips (Dr. Baltazar Apo) RX # [phone] Tech approval:MLD  Spoke to patient's Bridge Counselor, Sharol Roussel who will be taking patient her medication on Friday, around 2pm 06/25/09.  I tried to contact patient, but she was on the phone at the same time with the Bridge counselor setting up her Friday appt. Paulo Fruit  BS,CPht II,MPH  June 24, 2009 3:36 PM      Appended Document: [facility] medications arrived for Apr patient came herself to pick up.

## 2010-03-31 NOTE — Progress Notes (Signed)
  Phone Note Outgoing Call   Call placed by: Theotis Barrio NT II,  Jun 29, 2009 12:09 PM Call placed to: Patient Details for Reason: PATIENT NEEDS TO HAVE MEDICAID CARD CHANGED TO [facility] Summary of Call: CALLED PATIENT AND LEFT VOICE MESSAGE FOR PATIENT TO CALL ME , SHE NEEDS TO HAVE MEDICAID CARD CHANGE FROM [facility] TO [facility] BECAUSE WE ARE THE PRIMARY CARE GIVE. I AM UNABLE TO MAKE ANY REFERRAL BECAUSE OF THE INFO ON THE CARD. Ellyana Crigler NT II  Jun 29, 2009 12:12 PM

## 2010-03-31 NOTE — Miscellaneous (Signed)
Summary: [facility]   Imported By: Florinda Marker 04/09/2009 14:25:39  _____________________________________________________________________  External Attachment:    Type:   Image     Comment:   External Document

## 2010-03-31 NOTE — Assessment & Plan Note (Signed)
Summary: [employer] Given

## 2010-03-31 NOTE — Progress Notes (Signed)
Summary: Refill/gh  Phone Note Refill Request Message from:  Patient on June 01, 2009 10:54 AM  Refills Requested: Medication #1:  SEROQUEL XR 300 MG XR24H-TAB take once daily  Medication #2:  HYDROXYZINE HCL 25 MG TABS one four times a day  Medication #3:  OMEPRAZOLE 40 MG CPDR take once daily  Method Requested: Electronic Initial call taken by: Angelina Ok RN,  June 01, 2009 10:55 AM  Follow-up for Phone Call        done Follow-up by: Mliss Sax MD,  June 01, 2009 3:03 PM

## 2010-03-31 NOTE — Progress Notes (Signed)
Summary: FAX from [facility]/medication interaction  Phone Note From Pharmacy   Caller: [facility] [phone] Details for Reason: medication interaction Summary of Call: FAX sent from [facility] stating Kaletra interacts with Seroquel and Trazodone Initial call taken by: Wendall Mola CMA Duncan Dull),  June 22, 2009 11:39 AM  Follow-up for Phone Call        known no need to change Follow-up by: Yisroel Ramming MD,  June 22, 2009 3:45 PM

## 2010-03-31 NOTE — Op Note (Signed)
Summary: Repair of ventral hernia with mesh  NAME:  Ashley [name], Ashley [name]                ACCOUNT NO.:  [id number]   MEDICAL RECORD NO.:  [id number]          PATIENT TYPE:  AMB   LOCATION:  SDS                          FACILITY:  [facility]   PHYSICIAN:  Leonie Man, M.D.   DATE OF BIRTH:  March 24, 1965   DATE OF PROCEDURE:  DATE OF DISCHARGE:  11/23/2004                                 OPERATIVE REPORT   PREOPERATIVE DIAGNOSIS:  Incarcerated epigastric and ventral hernias.   POSTOPERATIVE DIAGNOSES:  Incarcerated epigastric and ventral hernias.   PROCEDURE:  Repair of ventral hernia with mesh.   SURGEON:  Leonie Man, M.D.   ASSISTANT:  Ms. Magnus Ivan, RNFA   ANESTHESIA:  General.   SPECIMENS:  There were no specimens to the lab.   ESTIMATED BLOOD LOSS:  Blood loss was minimal.   COMPLICATIONS:  None.   DISPOSITION:  Patient to the PACU in good condition.   HISTORY OF PRESENT ILLNESS:  [name] is a 45 year old woman with painful  incarcerated umbilical and epigastric hernias both within 4 cm of each other  on her anterior abdominal wall. She comes to the operating room now after  the risks and potential benefits of surgery have been fully discussed. All  questions answered and consent obtained for repair of these hernias.   PROCEDURE IN DETAIL:  Following the induction of satisfactory general  anesthesia, the patient was positioned supinely and the abdomen prepped and  draped routinely, and a midline incision is carried down over the epigastric  and umbilical hernias carrying down through the skin and subcutaneous  tissues combining the hernias together in a single incision. The intervening  fascia between the hernia is opened and the hernias are reduced. The  falciform ligament involving the epigastric hernia is clamped and tied with  a 2-0 silk. With a hernias both fully exposed, there was some attenuated  fascia to the side. We dissected around all the attenuated fascia. I  bridged  the gap with PROCEED mesh using interrupted 0-Novofil sutures to sew the  mesh in place to the surrounding fascia through the entire circumference of  the defect. The repair was noted to be intact. Sponge and instrument counts  were verified  and then the fascia was closed over the mesh with a running 2-0 Vicryl  suture. The skin was then closed with a 4-0 Monocryl suture reinforced with  Steri-Strips. Sterile dressings applied. Anesthetic reversed and the patient  removed from the operating room to the recovery room in stable condition  having tolerated the procedure well.      Leonie Man, M.D.  Electronically Signed     PB/MEDQ  D:  11/23/2004  T:  11/24/2004  Job:  [id number]

## 2010-03-31 NOTE — Miscellaneous (Signed)
Summary: Bridge Counselor  Clinical Lists Changes BC made a home visit on 07/15/09 to give pt some paperwork regarding her disability claim. BC also reminded pt of her 07/28/09 appointment at the [facility] to discuss narcotics.  Selena Batten [facility]  Jul 21, 2009 1:09 PM

## 2010-03-31 NOTE — Progress Notes (Signed)
Summary: PPD  Phone Note Outgoing Call   Call placed by: Annice Pih Summary of Call: Pt. needs PPD at next office visit

## 2010-03-31 NOTE — Letter (Signed)
[facility]   Imported By: Margie Billet 07/01/2009 11:02:48  _____________________________________________________________________  External Attachment:    Type:   Image     Comment:   External Document

## 2010-03-31 NOTE — Progress Notes (Signed)
  Phone Note Outgoing Call   Call placed by: Theotis Barrio NT II,  June 23, 2009 11:22 AM Call placed to: Specialist Details for Reason: [facility] REFERR[facility] [phone] Summary of Call: SPOKE WITH [facility] TO MAKE GI REFERRAL. UNABLE TO GET APPT BECAUSE PATIENT'S MEDICAID CARD HAS [facility] AS HER PRIMARY. TRIED TO CALL THE PATIENT BUT PHONE NUMBER IS INVAILED. Ashley [name] NT II  June 23, 2009 11:24 AM

## 2010-03-31 NOTE — Assessment & Plan Note (Signed)
Summary: F/U/VS   CC:  follow-up visit and lab results and B/P elevated.  History of Present Illness: Pt here to get lab results.  Her BP has been elevated.  She is taking her BP meds.  She also c/o eye irritation and would like some eye drops.  Pt feels like her Psych meds need to be adjusted. She is been experiencing worsening of her depression. She is not suicidal.  Preventive Screening-Counseling & Management  Alcohol-Tobacco     Alcohol drinks/day: few times weekly     Alcohol type: beer     Smoking Status: current     Smoking Cessation Counseling: yes     Packs/Day: 1.0     Year Started: started 12-13 yrs ago  Caffeine-Diet-Exercise     Caffeine use/day: sodas     Type of exercise: walking     Exercise (avg: min/session): >60     Times/week: 7  Safety-Violence-Falls     Seat Belt Use: yes      Drug Use:  Yes.     Updated Prior Medication List: ACTOS 30 MG TABS (PIOGLITAZONE HCL)  PROZAC 40 MG CAPS (FLUOXETINE HCL) take once daily HYDROXYZINE HCL 25 MG TABS (HYDROXYZINE HCL) one four times a day SEROQUEL XR 300 MG XR24H-TAB (QUETIAPINE FUMARATE) take once daily PERCOCET 5-325 MG TABS (OXYCODONE-ACETAMINOPHEN) Take 1 tab every 6 hours as needed for pain, not to exceed 3 tablets daily. OMEPRAZOLE 40 MG CPDR (OMEPRAZOLE) take once daily KALETRA 200-50 MG TABS (LOPINAVIR-RITONAVIR) Take 2 tablets by mouth twice a day TRUVADA 200-300 MG TABS (EMTRICITABINE-TENOFOVIR) take one daily TRAZODONE HCL 100 MG TABS (TRAZODONE HCL) Take 1 tablet by mouth at bedtime IBUPROFEN 600 MG TABS (IBUPROFEN) tke one as needed LISINOPRIL 40 MG TABS (LISINOPRIL) Take 1 tablet by mouth once a day ULTRAM 50 MG TABS (TRAMADOL HCL) Take 1 tablet by mouth every 8 hours as needed ENSURE  LIQD (NUTRITIONAL SUPPLEMENTS) take one three times daily EAR WAX DROPS 6.5 % SOLN (CARBAMIDE PEROXIDE) use 1-2 drop daily in both ears PRODIGY TWIST TOP LANCETS 28G  MISC (LANCETS) use to check blood sugar a  few times a week PRODIGY BLOOD GLUCOSE TEST  STRP (GLUCOSE BLOOD) use to check blood sugar a few timnes a week or when you feel blood sugar may be high (increased thirst, peeing,hunger, tiredness) GABAPENTIN 300 MG CAPS (GABAPENTIN) Take 1 tablet by mouth two times a day ETODOLAC 300 MG CAPS (ETODOLAC) Take 1 tab every 8 hours as needed for pain. PATANOL 0.1 % SOLN (OLOPATADINE HCL) one drop two times a day CLONIDINE HCL 0.2 MG TABS (CLONIDINE HCL) Take 1 tablet by mouth two times a day  Current Allergies (reviewed today): No known allergies  Past History:  Past Medical History: Last updated: 06/16/2009 Hypertension Diabetes mellitus, type II HIV disease  Review of Systems  The patient denies anorexia, fever, and weight loss.    Vital Signs:  Patient profile:   45 year old female Height:      59 inches (149.86 cm) Weight:      110.0 pounds (50.00 kg) BMI:     22.30 Temp:     97.7 degrees F (36.50 degrees C) oral Pulse rate:   82 / minute BP sitting:   190 / 117  (right arm)  Vitals Entered By: Wendall Mola CMA Duncan Dull) (Jul 01, 2009 10:14 AM) CC: follow-up visit and lab results, B/P elevated Is Patient Diabetic? Yes Did you bring your meter with you today?  No Pain Assessment Patient in pain? yes     Location: back and left ear Intensity: 10 Type: aching Onset of pain  Chronic Nutritional Status BMI of 19 -24 = normal Nutritional Status Detail appetite "good"  Does patient need assistance? Functional Status Self care Ambulation Normal Comments pt still needs to fill some B/P meds   Physical Exam  General:  alert, well-developed, well-nourished, and well-hydrated.   Head:  normocephalic and atraumatic.   Lungs:  normal breath sounds.   Heart:  normal rate and regular rhythm.      Impression & Recommendations:  Problem # 1:  HIV DISEASE (ICD-042) Will bring pt back in 6 weeks for repeat labs now that she is on her meds.  She is to take them every  day. Diagnostics Reviewed:  HIV: REACTIVE (03/18/2009)   HIV-Western blot: * (03/18/2009)   CD4: 350 (06/11/2009)   WBC: 3.9 (06/11/2009)   Hgb: 14.8 (06/11/2009)   HCT: 42.3 (06/11/2009)   Platelets: 130 (06/11/2009) HIV genotype: * (03/18/2009)   HIV-1 RNA: 2360 (06/11/2009)   HBSAg: NEG (03/18/2009)  Problem # 2:  HYPERTENSION (ICD-401.9) increase catapres to 0.2mg  two times a day .  Pt to f/u with PCP. The following medications were removed from the medication list:    Catapres 0.1 Mg Tabs (Clonidine hcl) .Marland Kitchen... Take 1 tablet by mouth two times a day Her updated medication list for this problem includes:    Lisinopril 40 Mg Tabs (Lisinopril) .Marland Kitchen... Take 1 tablet by mouth once a day    Clonidine Hcl 0.2 Mg Tabs (Clonidine hcl) .Marland Kitchen... Take 1 tablet by mouth two times a day  Problem # 3:  BIPOLAR AFFECTIVE DISORDER, DEPRESSED, SEVERE (ICD-296.53) referred to [facility].  Medications Added to Medication List This Visit: 1)  Patanol 0.1 % Soln (Olopatadine hcl) .... One drop two times a day 2)  Clonidine Hcl 0.2 Mg Tabs (Clonidine hcl) .... Take 1 tablet by mouth two times a day  Other Orders: Est. Patient Level III (62952) Future Orders: T-CD4SP ([facility]) (CD4SP) ... 08/12/2009 T-HIV Viral Load [phone]) ... 08/12/2009 T-Comprehensive Metabolic Panel [phone]) ... 08/12/2009 T-CBC w/Diff ([id number]) ... 08/12/2009  Patient Instructions: 1)  Please schedule a follow-up appointment in 8 weeks. 2)  schedule PAP in PAP clinic  Prescriptions: CLONIDINE HCL 0.2 MG TABS (CLONIDINE HCL) Take 1 tablet by mouth two times a day  #60 x 5   Entered and Authorized by:   Yisroel Ramming MD   Signed by:   Yisroel Ramming MD on 07/01/2009   Method used:   Print then Give to Patient   RxID:   [id number] PATANOL 0.1 % SOLN (OLOPATADINE HCL) one drop two times a day  #10 ml x 0   Entered and Authorized by:   Yisroel Ramming MD   Signed by:   Yisroel Ramming MD on 07/01/2009   Method  used:   Print then Give to Patient   RxID:   [id number]

## 2010-03-31 NOTE — Assessment & Plan Note (Signed)
Summary: DIABETES TEACHING/CFB   Vital Signs:  Patient profile:   45 year old female Weight:      113 pounds BMI:     22.91 Is Patient Diabetic? Yes Did you bring your meter with you today? No CBG Result 111 Comments 2-3 hours after sandwich and halfan apple - no medicine for diabetes for the past week and a half   Allergies: No Known Drug Allergies   Complete Medication List: 1)  Actos 30 Mg Tabs (Pioglitazone hcl) 2)  Catapres 0.1 Mg Tabs (Clonidine hcl) .... Take 1 tablet by mouth two times a day 3)  Prozac 40 Mg Caps (Fluoxetine hcl) .... Take once daily 4)  Hydroxyzine Hcl 25 Mg Tabs (Hydroxyzine hcl) .... One four times a day 5)  Seroquel Xr 300 Mg Xr24h-tab (Quetiapine fumarate) .... Take once daily 6)  Percocet 5-325 Mg Tabs (Oxycodone-acetaminophen) .... Take 1 tab every 6 hours as needed for pain, not to exceed 3 tablets daily. 7)  Omeprazole 40 Mg Cpdr (Omeprazole) .... Take once daily 8)  Kaletra 200-50 Mg Tabs (Lopinavir-ritonavir) .... Take 2 tablets by mouth twice a day 9)  Truvada 200-300 Mg Tabs (Emtricitabine-tenofovir) .... Take one daily 10)  Trazodone Hcl 100 Mg Tabs (Trazodone hcl) .... Take 1 tablet by mouth at bedtime 11)  Ibuprofen 600 Mg Tabs (Ibuprofen) .... Tke one as needed 12)  Lisinopril 40 Mg Tabs (Lisinopril) .... Take 1 tablet by mouth once a day 13)  Ultram 50 Mg Tabs (Tramadol hcl) .... Take 1 tablet by mouth every 8 hours as needed 14)  Ensure Liqd (Nutritional supplements) .... Take one three times daily 15)  Ear Wax Drops 6.5 % Soln (Carbamide peroxide) .... Use 1-2 drop daily in both ears 16)  Prodigy Twist Top Lancets 28g Misc (Lancets) 17)  Prodigy Blood Glucose Test Strp (Glucose blood) .... Use to check blood sugar a few timnes a week or when you feel blood sugar may be high (increased thirst, peeing,hunger, tiredness)  Other Orders: DSMT (Medicaid) 60 Minutes [phone])  Patient Instructions: 1)  [facility] 2)  Dr. Philipp Deputy is [address] Medical building. 3)  Make a follow up wqiht Jamison Neighbor 6 months.   Diabetes Self Management Training  PCP: Melida Quitter MD Date diagnosed with diabetes: 02/28/1999 Diabetes Type: Type 2 non-insulin Other persons present: no Current smoking Status: current  Vital Signs Todays Weight: 113lb  in BMI 22.91in-lbs   Assessment Work Hours: Not currently working Sources of Support: church, family members, friends Years of school completed: High scool degree Special needs or Barriers: all medicine and blood glucose meter was thrown away at previous residence. Now residing in a place iwhout runnign water, walking a lot due to no trnaportation # of people in household: 2  Coping with Diabetes Feelings about Diabetes: Action Would you say you are: happy     Estimated /Usual Carb Intake Breakfast # of Carbs/Grams nothing Lunch # of Carbs/Grams sandwich potted meat and half na apple and water Dinner # of Carbs/Grams whatever is available Diabetes Disease Process  Discussed today Define diabetes in simple terms: Needs review/assistance   State diabetes is treated by meal plan-exercise-medication-monitoring-education: Needs review/assistance    Medications Describe safe needle/lancet disposal: Needs review/assistance    Nutritional Management Identify what foods most often affect blood glucose: Needs review/assistance    Verbalize importance of controlling food portions: Demonstrates competencyState importance of spacing and not omitting meals and snacks: Demonstrates  competencyState changes planned for home meals/snacks: Needs review/assistance    Monitoring  Complications State the causes-signs and symptoms and prevention of Hyperglycemia: Demonstrates competency   Explain proper treatment of hyperglycemia: Demonstrates competency   State the causes- signs and symptoms and prevention of hypoglycemia: Not  applicableExplain proper treatment of hypoglycemia: Not applicableGlucose control and the development/prevention of long-term complications: Needs review/assistance   State benefits-risks-and options for improving blood sugar control: Demonstrates competency   B/P and lipid control in the prevention/control of cardiovascular disease: Needs review/assistance   State the principles of skin-dental and foot care: Demonstrates competency   Describe symptoms of skin and foot problems and describe foot exam: Demonstrates competency   State when to seek medical advice and treatment: Demonstrates competency    Exercise States importance of exercise: Demonstrates competencyStates effect of exercise on blood glucose: Demonstrates competencyVerbalizes safety measures for exercise related to diabetes: Not applicable Lifestyle changes:Goal setting and Problem solving State benefits of making appropriate lifestyle changes: Needs review/assistance   Identify lifestyle behaviors that need to change: Needs review/assistance   Identify risk factors that interfere with health: Needs review/assistance   Develop strategies to reduce risk factors: Needs review/assistance   Verbalize need for and frequency of health care follow-up: Needs review/assistanceList at least two appropriate community resources: Needs review/assistanceIdentify Family/SO role in managing diabetes: Demonstrates competency Psychosocial Adjustment Identify how stress affects diabetes & two sources of stress: Needs review/assistanceName two ways of obtaining support from family/friends: Needs review/assistanceDiabetes Management Education Done: 05/31/2009    BEHAVIORAL GOALS INITIAL Utilizing medications if for therapeutic effectiveness: get medicines so I can take them Preventing,detecting and treating complications: think about quitting smoking        Diabetes appears well controlled wihtout diabetes medicine. Patient's exercise and weight  loss may have contributed to this.  Diabetes Self Management Support:church, family and  clinic staff Follow-up:6 months Has lost 10 pounds in past month. bodyweight is still within normal limits and patient reports good appetite, but decreased intake due to toothache. She didn;t know she had the prescription fo rensure, disucssed today and she will arrange to pick it up and can use instead of skipping meals.

## 2010-03-31 NOTE — Consult Note (Signed)
Summary: [facility]  [facility]   Imported By: Florinda Marker 04/09/2009 14:29:30  _____________________________________________________________________  External Attachment:    Type:   Image     Comment:   External Document

## 2010-03-31 NOTE — Progress Notes (Signed)
Summary: refill/ hla  Phone Note Refill Request Message from:  Patient on June 11, 2009 11:06 AM  Refills Requested: Medication #1:  PERCOCET 5-325 MG TABS Take 1 tab every 6 hours as needed for pain case manager, kim poff at [phone], pt does not have phone please call case manager when this has been done. script will need to be faxed and then [facility] to [facility] per Byrd Hesselbach dupree and caroline at [facility]  Initial call taken by: Marin Roberts RN,  June 11, 2009 11:07 AM  Follow-up for Phone Call        There is a phone note from Dr. Rogelia Boga from 04/05 outlining why patient cannot have Percocet as she is violating her pain contract.  Follow-up by: Melida Quitter MD,  June 12, 2009 2:51 AM

## 2010-03-31 NOTE — Miscellaneous (Signed)
Summary: clinical update/ryan white NCADAP appr til 05/28/10  Clinical Lists Changes  Observations: Added new observation of AIDSDAP: Yes 2011 (05/04/2009 8:54)

## 2010-03-31 NOTE — Op Note (Signed)
Summary: Liver BX & Pathology   [facility]  Patient:    Ashley [name], Ashley [name]                         MRN: [id number] Proc. Date: 04/05/00 Adm. Date:  [id number] Attending:  Judeth Cornfield                           Operative Report  INDICATION:  Ms. Gibbon has Hepatitis C.  MEDICATIONS:  Versed 2 mg, fentanyl 25 mcg.  DESCRIPTION OF PROCEDURE:  The patient was placed in the supine position.  The liver was percussed.  The skin was prepped with Betadine and cleaned with iodine.  It was injected with 1% lidocaine.  Percutaneous biopsy was obtained via one pass at the ninth right intercostal space peeling 1 cm of the liver cord.  The patient tolerated the procedure well.  IMPRESSION:  Hepatitis C. DD:  04/05/00 TD:  04/06/00 Job: [id number]    SP Surgical Pathology - STATUS: Final             By: Morrie Sheldon,       Perform Date: 7 Feb02 07:18  Ordered By: Dennard Nip,         Ordered Date:  Facility: [facility]                              Department: CPATH  Service Report Text  [facility]   [address] [place], [place] [address]   [phone]    REPORT OF SURGICAL PATHOLOGY    Case #: [id number]   Patient Name: Ashley [name], Ashley [name]   PID: [id number]   Pathologist: Beulah Gandy. 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    COMMENT   Most of the portal areas show mild to moderate enlargement by   mononuclear inflammatory cell infiltrates which are associated   with foci of interface hepatitis. There are patchy, small   lobular infiltrates some of which are associated with necrotic   hepatocytes. There is minimal steatosis present. No increased   iron is identified  with iron stain. The trichrome stain shows   focal portal fibrosis and a rare focus of periportal fibrosis but   this is a focal change. The iron and trichrome controls stained   appropriately. The findings are consistent with chronic mildly   active hepatitis C, inflammatory grade II and fibrosis stage I.    smr   Date Reported: 04/06/2000 Beulah Gandy. Luisa Hart, MD   *** Electronically Signed Out By JDP ***    Clinical information   Hepatitis C. (tmc)    specimen(s) obtained   Liver, biopsy    Gross Description   Received in formalin are two cores of tan red soft tissue which   measure 0.2 x 0.1 cm and 0.6 x 0.1 cm and are submitted in one   block. (SSW:kcv 2-7)    kv/

## 2010-03-31 NOTE — Progress Notes (Signed)
  Phone Note Other Incoming   Caller: Ashley [name] [phone] Summary of Call: Ashley [name] Counselor requests RX refill for pts. Ensure,  wants prescription sent to [facility] Wendall Mola CMA Duncan Dull)  June 11, 2009 2:44 PM  Initial call taken by: Wendall Mola CMA Duncan Dull),  June 11, 2009 2:44 PM

## 2010-03-31 NOTE — Assessment & Plan Note (Signed)
Summary: ACUTE/SIDHU/PER GAYLE SICK VISIT/CH   Vital Signs:  Patient profile:   45 year old female Height:      59 inches (149.86 cm) Weight:      108.9 pounds (49.50 kg) BMI:     22.07 Temp:     96.5 degrees F (35.83 degrees C) oral Pulse rate:   78 / minute BP sitting:   174 / 96  (right arm)  Vitals Entered By: Stanton Kidney Ditzler RN (Jun 30, 2009 10:50 AM) Is Patient Diabetic? Yes Did you bring your meter with you today? No Pain Assessment Patient in pain? yes     Location: back Intensity: 10 Type: sharp Onset of pain  long time Nutritional Status BMI of < 19 = underweight Nutritional Status Detail appetite good CBG Result 143  Have you ever been in a relationship where you felt threatened, hurt or afraid?denies   Does patient need assistance? Functional Status Self care Ambulation Normal Comments Discuss back pain - went to [facility] 06/29/09.   Primary Care Provider:  Melida Quitter MD   History of Present Illness: She present complaining of back pain since  all her live. She wants her pain medications. She is now enrolled in the [facility] for drug rehab. She wants percocet. She think that just because she has been enrolled in the classes we will give her percocet. She relates that she used cocaine couples of days ago.  When we denied to give her percocet she used inappropriate language.  We explain to her that we wont prescribe percocet if she is using cocaine for safety and interactions and adversed effects.  Case discussed with Dr Phillips Odor.   Depression History:      The patient denies a depressed mood most of the day and a diminished interest in her usual daily activities.         Preventive Screening-Counseling & Management  Alcohol-Tobacco     Alcohol drinks/day: few times weekly     Alcohol type: beer     Smoking Status: current     Smoking Cessation Counseling: yes     Packs/Day: 1.0     Year Started: started 12-13 yrs  ago  Caffeine-Diet-Exercise     Caffeine use/day: sodas     Type of exercise: walking     Exercise (avg: min/session): >60     Times/week: 7  Allergies: No Known Drug Allergies   Impression & Recommendations:  Problem # 1:  HYPERTENSION (ICD-401.9) She has not been taking  her BP  medications. I gave her refilled today.  Her updated medication list for this problem includes:    Lisinopril 40 Mg Tabs (Lisinopril) .Marland Kitchen... Take 1 tablet by mouth once a day    Clonidine Hcl 0.2 Mg Tabs (Clonidine hcl) .Marland Kitchen... Take 1 tablet by mouth two times a day  Problem # 2:  DRUG ABUSE (ICD-305.90) I will check UDS if positive for cocaine, patient wont be able to get percocet. I explain to the patient also that she needs to have serial UDS negative before we prescribe to her percocet. Please read HPI.  Orders: T-Drug Screen-Urine, (single) [phone])  Problem # 3:  BACK PAIN, CHRONIC (ICD-724.5) Continue with current regimen. No percocet due to current drug ( cocaine) used. Her updated medication list for this problem includes:    Percocet 5-325 Mg Tabs (Oxycodone-acetaminophen) .Marland Kitchen... Take 1 tab every 6 hours as needed for pain, not to exceed 3 tablets daily.    Ibuprofen 600 Mg Tabs (  Ibuprofen) .Marland Kitchen... Tke one as needed    Ultram 50 Mg Tabs (Tramadol hcl) .Marland Kitchen... Take 1 tablet by mouth every 8 hours as needed    Etodolac 300 Mg Caps (Etodolac) .Marland Kitchen... Take 1 tab every 8 hours as needed for pain.  Complete Medication List: 1)  Actos 30 Mg Tabs (Pioglitazone hcl) 2)  Prozac 40 Mg Caps (Fluoxetine hcl) .... Take once daily 3)  Hydroxyzine Hcl 25 Mg Tabs (Hydroxyzine hcl) .... One four times a day 4)  Seroquel Xr 300 Mg Xr24h-tab (Quetiapine fumarate) .... Take once daily 5)  Percocet 5-325 Mg Tabs (Oxycodone-acetaminophen) .... Take 1 tab every 6 hours as needed for pain, not to exceed 3 tablets daily. 6)  Omeprazole 40 Mg Cpdr (Omeprazole) .... Take once daily 7)  Kaletra 200-50 Mg Tabs  (Lopinavir-ritonavir) .... Take 2 tablets by mouth twice a day 8)  Truvada 200-300 Mg Tabs (Emtricitabine-tenofovir) .... Take one daily 9)  Trazodone Hcl 100 Mg Tabs (Trazodone hcl) .... Take 1 tablet by mouth at bedtime 10)  Ibuprofen 600 Mg Tabs (Ibuprofen) .... Tke one as needed 11)  Lisinopril 40 Mg Tabs (Lisinopril) .... Take 1 tablet by mouth once a day 12)  Ultram 50 Mg Tabs (Tramadol hcl) .... Take 1 tablet by mouth every 8 hours as needed 13)  Ensure Liqd (Nutritional supplements) .... Take one three times daily 14)  Ear Wax Drops 6.5 % Soln (Carbamide peroxide) .... Use 1-2 drop daily in both ears 15)  Prodigy Twist Top Lancets 28g Misc (Lancets) .... Use to check blood sugar a few times a week 16)  Prodigy Blood Glucose Test Strp (Glucose blood) .... Use to check blood sugar a few timnes a week or when you feel blood sugar may be high (increased thirst, peeing,hunger, tiredness) 17)  Gabapentin 300 Mg Caps (Gabapentin) .... Take 1 tablet by mouth two times a day 18)  Etodolac 300 Mg Caps (Etodolac) .... Take 1 tab every 8 hours as needed for pain. 19)  Patanol 0.1 % Soln (Olopatadine hcl) .... One drop two times a day 20)  Clonidine Hcl 0.2 Mg Tabs (Clonidine hcl) .... Take 1 tablet by mouth two times a day  Other Orders: Capillary Blood Glucose/CBG (04540)  Patient Instructions: 1)  Please schedule a follow-up appointment in  2 weeks.. Prescriptions: CATAPRES 0.1 MG TABS (CLONIDINE HCL) Take 1 tablet by mouth two times a day  #60 x 1   Entered and Authorized by:   Hartley Barefoot MD   Signed by:   Hartley Barefoot MD on 06/30/2009   Method used:   Print then Give to Patient   RxID:   [id number] LISINOPRIL 40 MG TABS (LISINOPRIL) Take 1 tablet by mouth once a day  #30 x 1   Entered and Authorized by:   Hartley Barefoot MD   Signed by:   Hartley Barefoot MD on 06/30/2009   Method used:   Print then Give to Patient   RxID:   [id number]   Prevention & Chronic  Care Immunizations   Influenza vaccine: Not documented   Influenza vaccine deferral: Not indicated  (05/31/2009)    Tetanus booster: Not documented   Td booster deferral: Not available  (05/31/2009)    Pneumococcal vaccine: Pneumovax  (06/11/2009)  Other Screening   Pap smear: Not documented   Pap smear action/deferral: Deferred-3 yr interval  (05/31/2009)    Mammogram: Not documented   Mammogram action/deferral: Refused  (05/31/2009)   Smoking status: current  (06/30/2009)  Smoking cessation counseling: yes  (06/30/2009)  Diabetes Mellitus   HgbA1C: 5.2  (06/16/2009)    Eye exam: Not documented   Diabetic eye exam action/deferral: Not indicated  (05/31/2009)    Foot exam: yes  (04/08/2009)   Foot exam action/deferral: Do today   High risk foot: Not documented   Foot care education: Not documented    Urine microalbumin/creatinine ratio: Not documented   Urine microalbumin action/deferral: Not indicated  Lipids   Total Cholesterol: 166  (03/18/2009)   LDL: 72  (03/18/2009)   LDL Direct: Not documented   HDL: 64  (03/18/2009)   Triglycerides: 148  (03/18/2009)  Hypertension   Last Blood Pressure: 174 / 96  (06/30/2009)   Serum creatinine: 1.16  (06/11/2009)   Serum potassium 4.5  (06/11/2009)  Self-Management Support :   Personal Goals (by the next clinic visit) :     Personal A1C goal: 7  (06/16/2009)     Personal blood pressure goal: 130/80  (06/16/2009)     Personal LDL goal: 70  (06/16/2009)    Patient will work on the following items until the next clinic visit to reach self-care goals:     Medications and monitoring: take my medicines every day, check my blood sugar  (06/30/2009)     Eating: eat more vegetables, use fresh or frozen vegetables, eat foods that are low in salt, eat fruit for snacks and desserts  (06/30/2009)     Activity: take a 30 minute walk every day, park at the far end of the parking lot  (06/30/2009)    Diabetes self-management  support: Written self-care plan, Education handout, Resources for patients handout  (06/30/2009)   Diabetes care plan printed   Diabetes education handout printed   Last diabetes self-management training by diabetes educator: 05/31/2009    Hypertension self-management support: Written self-care plan, Education handout, Resources for patients handout  (06/30/2009)   Hypertension self-care plan printed.   Hypertension education handout printed      Resource handout printed.  Process Orders Check Orders Results:     [facility]: ABN not required for this insurance Tests Sent for requisitioning (Jul 01, 2009 6:12 PM):     06/30/2009: [facility] -- T-Drug Screen-Urine, (single) [phone] (signed)    Process Orders Check Orders Results:     [facility]: ABN not required for this insurance Tests Sent for requisitioning (Jul 01, 2009 6:12 PM):     06/30/2009: [facility] -- T-Drug Screen-Urine, (single) [[id number]] (signed)   Appended Document: ACUTE/SIDHU/PER GAYLE SICK VISIT/CH I spoke with Ms. Coulston in detail regarding her request for percocet. Apparently she thought just signing up for the drug rehab class was enough- not that she had to actually stop using crack cocaine in order to get percocet for her pain. We asked her for a urine sample today-She used inappropriate language and became agressive- it is quite possible that she was under the influence of drugs at her visit- after we discussed our plans she had the nurse come back and ask [place] where her prescription was. I went out front to tell the patient she needed to schedule an appointment for next week and remain drug free. I expalined to her it was simply unsafe to prescribe percocet to someone who was actively and to my knowledge using cocaine. I told her we would be more than happy to treat her pain if she would get into drug treatment and demonstrate serial normal  UDS.  She made a vary large scene at the front desk, was yelling profanities at myself and staff. I told her we would not tolerate this kind of behavior at this practice and if it continued we would have her discharged.

## 2010-03-31 NOTE — Miscellaneous (Signed)
Summary: HIPAA Restrictions  HIPAA Restrictions   Imported By: Florinda Marker 03/18/2009 16:06:30  _____________________________________________________________________  External Attachment:    Type:   Image     Comment:   External Document

## 2010-03-31 NOTE — Progress Notes (Signed)
  Phone Note Outgoing Call   Call placed by: Theotis Barrio NT II,  September 09, 2009 3:23 PM Call placed to: Patient Details for Reason: FOLLOW UP ON CHANGE OF MEDICAID CARD PCP Summary of Call: UNABLE TO CONTACT PATIENT / BOTH PHONE NUMBERS "NO GOOD" UNABLE TO MAKE THIS REFERRAL BECAUSE MEDICAID CARD DOES NOT HAVE [facility] AS THE PRIMARY. UNTIL THEN , CAN NOT REFERRL THIS PATIENT USING THIS MEDICAID INFOMATION. THE PATIENT IS AWARE OF THIS. SHE WAS TO GET THIS CHANGED. Lela Sturdivant NT II  September 09, 2009 3:27 PM

## 2010-03-31 NOTE — Assessment & Plan Note (Signed)
Summary: DM TRAINING/VS   Allergies: No Known Drug Allergies   Complete Medication List: 1)  Actos 30 Mg Tabs (Pioglitazone hcl) 2)  Catapres 0.1 Mg Tabs (Clonidine hcl) 3)  Prozac 40 Mg Caps (Fluoxetine hcl) .... Take once daily 4)  Hydroxyzine Hcl 25 Mg Tabs (Hydroxyzine hcl) .... One four times a day 5)  Seroquel Xr 300 Mg Xr24h-tab (Quetiapine fumarate) .... Take once daily 6)  Vicodin 5-500 Mg Tabs (Hydrocodone-acetaminophen) .... Take one as needed for pain 7)  Omeprazole 40 Mg Cpdr (Omeprazole) .... Take once daily 8)  Kaletra 200-50 Mg Tabs (Lopinavir-ritonavir) .... Take one daily 9)  Truvada 200-300 Mg Tabs (Emtricitabine-tenofovir) .... Take one daily 10)  Trazodone Hcl 100 Mg Tabs (Trazodone hcl) .... Take 1 tablet by mouth at bedtime 11)  Ibuprofen 600 Mg Tabs (Ibuprofen) .... Tke one as needed 12)  Lisinopril 20 Mg Tabs (Lisinopril) .... Take 1 tablet by mouth once a day 13)  Ultram 50 Mg Tabs (Tramadol hcl) .... Take 1 tablet by mouth every 8 hours as needed  Other Orders: DSMT(Medicare) Individual, 30 Minutes (Z6109)  Diabetes Self Management Training  PCP: Melida Quitter MD Date diagnosed with diabetes: 02/28/1999 Diabetes Type: Type 2 non-insulin Current smoking Status: current  Assessment Work Hours: Not currently working Sources of Support: currenlty lives in a shelter  Diabetes Medications:  Comments: knew medications, has had diabetes training and education. knew how to use meter. Provided a prodigy meter today needs prescription for strips and lancets sent o [facility] on [address]    Monitoring Self monitoring blood glucose 2 times a day Name of Meter  Prodigy Autocode  Recent Episodes of: Requiring Help from another person  Hyperglycemia : No Hypoglycemia: No Severe Hypoglycemia : No     Estimated /Usual Carb Intake Breakfast # of Carbs/Grams ensure Lunch # of Carbs/Grams ensure Dinner # of Carbs/Grams meal from shelter  Activity  Limitations  Appropriate physical activity Diabetes Disease Process  Discussed today State own type of diabetes: Demonstrates competency Medications State name-action-dose-duration-side effects-and time to take medication: Demonstrates competency Nutritional Management  Monitoring State purpose and frequency of monitoring BG-ketones-HgbA1C  : Demonstrates competency   Perform glucose monitoring/ketone testing and record results correctly: Demonstrates competency    State target blood glucose and HgbA1C goals: Demonstrates competency    Complications State sick day guidelines and when to contact health care team: Needs review/assistance    Exercise Diabetes Management Education Done: 04/08/2009    BEHAVIORAL GOALS INITIAL Monitoring blood glucose levels daily: check blood sugar once daily        Diabetes Self Management Support: shelter and clinic staff Follow-up:next appoitnment wiht doctor requested prescription for ensure twice daily for THP- will disucss with Infectious disease nurses

## 2010-03-31 NOTE — Letter (Signed)
Summary: LETTER Ardelia Mems TO REACH PATIENT  LETTER Ardelia Mems TO REACH PATIENT   Imported By: Margie Billet 06/24/2009 10:40:58  _____________________________________________________________________  External Attachment:    Type:   Image     Comment:   External Document

## 2010-03-31 NOTE — Assessment & Plan Note (Signed)
Summary: F/U/VS   Primary Provider:  Melida Quitter MD  CC:  follow-up visit, lab results, and needs refills all meds.  History of Present Illness: Pt here for f/u.  She is currently in drug rehab which is going well.  By patient report, she was recently hospitalized at [facility] with CP. She was scheduled for a stress test as an outpatient but it was unable to be performed due to elevated BP.  She did not take her BP medication on the day she was scheduled for the stress test. She would like a referral to get her abdominal hernia repaired.  Pt also needs meds refilled.  Preventive Screening-Counseling & Management  Alcohol-Tobacco     Alcohol drinks/day: 0     Alcohol type: beer     Smoking Status: current     Smoking Cessation Counseling: yes     Packs/Day: 1.0     Year Started: started 12-13 yrs ago  Caffeine-Diet-Exercise     Caffeine use/day: sodas     Type of exercise: walking     Exercise (avg: min/session): >60     Times/week: 7  Safety-Violence-Falls     Seat Belt Use: yes      Drug Use:  Yes.    Comments: pt. declined condoms   Updated Prior Medication List: ACTOS 30 MG TABS (PIOGLITAZONE HCL)  PROZAC 40 MG CAPS (FLUOXETINE HCL) take once daily HYDROXYZINE HCL 25 MG TABS (HYDROXYZINE HCL) one four times a day SEROQUEL XR 300 MG XR24H-TAB (QUETIAPINE FUMARATE) take once daily OMEPRAZOLE 40 MG CPDR (OMEPRAZOLE) take once daily KALETRA 200-50 MG TABS (LOPINAVIR-RITONAVIR) Take 2 tablets by mouth twice a day TRUVADA 200-300 MG TABS (EMTRICITABINE-TENOFOVIR) take one daily TRAZODONE HCL 100 MG TABS (TRAZODONE HCL) Take 1 tablet by mouth at bedtime IBUPROFEN 600 MG TABS (IBUPROFEN) tke one as needed LISINOPRIL 40 MG TABS (LISINOPRIL) Take 1 tablet by mouth once a day ULTRAM 50 MG TABS (TRAMADOL HCL) Take 1 tablet by mouth every 8 hours as needed ENSURE  LIQD (NUTRITIONAL SUPPLEMENTS) take one three times daily EAR WAX DROPS 6.5 % SOLN (CARBAMIDE PEROXIDE) use 1-2  drop daily in both ears PRODIGY TWIST TOP LANCETS 28G  MISC (LANCETS) use to check blood sugar a few times a week PRODIGY BLOOD GLUCOSE TEST  STRP (GLUCOSE BLOOD) use to check blood sugar a few timnes a week or when you feel blood sugar may be high (increased thirst, peeing,hunger, tiredness) GABAPENTIN 300 MG CAPS (GABAPENTIN) Take 1 tablet by mouth two times a day ETODOLAC 300 MG CAPS (ETODOLAC) Take 1 tab every 8 hours as needed for pain. PATANOL 0.1 % SOLN (OLOPATADINE HCL) one drop two times a day CLONIDINE HCL 0.2 MG TABS (CLONIDINE HCL) Take 1 tablet by mouth two times a day  Current Allergies (reviewed today): No known allergies  Past History:  Past Medical History: Last updated: 06/16/2009 Hypertension Diabetes mellitus, type II HIV disease  Vital Signs:  Patient profile:   45 year old female Height:      59 inches (149.86 cm) Weight:      120.12 pounds (54.60 kg) BMI:     24.35 Temp:     98.2 degrees F (36.78 degrees C) oral Pulse rate:   76 / minute BP sitting:   163 / 95  (right arm)  Vitals Entered By: Wendall Mola CMA Duncan Dull) (September 08, 2009 10:46 AM) CC: follow-up visit, lab results, needs refills all meds Is Patient Diabetic? Yes Did you bring your  meter with you today? No Pain Assessment Patient in pain? yes     Location: back and legs Intensity: 10 Type: aching Onset of pain  Constant Nutritional Status BMI of 19 -24 = normal Nutritional Status Detail appetite "good"  Have you ever been in a relationship where you felt threatened, hurt or afraid?No   Does patient need assistance? Functional Status Self care Ambulation Normal Comments pt. said she has been taking 2 Prozaac because it has not been working and says that hosp. increased her dose of Percocet and is requesting refills on this as well         Medication Adherence: 09/08/2009   Adherence to medications reviewed with patient. 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 Patient Level IV (04540) Cardiology Other (Cardiology Other) Future Orders: T-CD4SP ([facility]) (CD4SP) ... 12/07/2009 T-HIV Viral Load [id number]) ... 12/07/2009 T-Comprehensive Metabolic Panel [phone]) ... 12/07/2009 T-CBC w/Diff ([id number]) ... 12/07/2009  Patient Instructions: 1)  Please schedule a follow-up appointment in 3 months, 2 weeks after labs.  Prescriptions: ENSURE  LIQD (NUTRITIONAL SUPPLEMENTS) take one three times daily  #2 cases x 6   Entered and Authorized by:   Yisroel Ramming MD   Signed by:   Yisroel Ramming MD on 09/08/2009   Method used:   Print then Give to Patient   RxID:   [id number]

## 2010-03-31 NOTE — Assessment & Plan Note (Signed)
Summary: NEW 042/[facility] / [facility]     Infectious Disease New Patient Intake Referring MD/Agency: [facility]  Address: [facility]   Return Appointment Date: 03/31/2009  With Physician: Yisroel Ramming Medical Records: Requested Health Insurance / Payor: No Insurance Employer: None   Does insurance cover prescriptions? No Our patient has been informed that medication assistance programs are available.  Our Co-ordinator will be meeting with the patient during this visit to discuss financial and medication assistance.   Do you have a Primary physician: No Are family members aware of patient's diagnosis?  If so, are they supportive? 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 She recently moved from [facility] where she was being [facility]. She has been w/o medication for 3 months now.  Her medicaid has expired and she was not able to pay for medication  She presents today with multiple complaints and says she was told by the ED she needed to establish care with the ID clinic for care.  . She give prior history of severe back and knee pain which is present today. She also has a headache with blurred vision . As I continue with her health history she stated she has occasional   chest pain present within the last 2 days that last from 3-5 mins and occur at random.  She is not symptomatic for chest pain at this time.  She c/o insomnia  and states she has not slept in days.    Family History Heart Disease: Yes  Family Side: Maternal, Paternal Hypertension: Yes  Family Side: Maternal  Comments: stroke  Diabetes: Yes  Family Side: Maternal,  Paternal Cancer: Yes  Family Side: Maternal, Paternal  Comments: Breast, lung, liver   Tobacco Use: never  Behavioral Health Assessment Have you ever been diagnosed with depression or mental illness? Yes  Diagnosis: Bipolar, Schizophrenia, Anxiety Disorder  Do you drink alcohol? Yes Last Date of Consumption: 03/16/2009 Frequency: few times weekly Alcohol Beverage Type(s): beer Do you use recreational drugs? Yes Drugs: marijuana  Past History of crack cocaine use for 15 years  clean X 2 mo Alcohol Do you feel you have a problem with drugs and/or alcohol? No   Have you ever been in a treatment facility for any addiction? Yes Facility Name 1: [facility]   City/State: [place], [place] Facility Name 2: [facility]    City/State: [place], [place] If you are currently using drugs and/or alcohol, would you like help for this addiction? No Behavioral Health Comments: Pt states [facility] Case Manager will be scheduling an appt. with the [facility] soon.   HIV Intake Information When did you first test positive for HIV? 01/12/2006 Type of test Conducted: WB   Where was this test performed?  Name of Agency: Verified through [facility]  City/State: [place] Was this your first time ever being tested or HIV? Yes Risk Factor(s) for HIV: Heterosexual contact  Method of Exposure to HIV: Heterosexual Intercourse Have you ever been hospitalized for any HIV-related condition? Yes Hospital Name 1: [facility]  Reason: PCP Hospital Name 2: [facility]   Reason: unknown  Newly Diagnosed Patients Has a Disease Intervention Specialist from the Health Department  contacted the patient? Yes.   The patient has been informed that the [facility] Department will contact ALL newly reported cases. Health Department Contact:  [phone]   (SSN is needed for confirmation)  Reported Today: 03/18/2009 Health Department Contact:  [phone]            (SSN is needed for confirmation)  Person  Reporting: prev. reported/tkk  Do you have any Non-HIV related medical conditions or other prior hospitalizations or surgeries? Yes  HIV Medications Information  Protease Inhibitors (PI's): Kaletra (Lopinavir/Ritonavir): Yes   Last Date of Use:  9-10  Nucleoside Reverse Transcriptase Inhibitors (NRTI's) Truvada (Emtricitabine/Tenfovir/Disoroxil fumarate): Yes   Last Date of Use:  9-10  Infection History  Patient has been diagnosed with the following opportunistic infections: Pneumocystis carinii pneumonia (PCP) Yes Are there any other symptoms you need to discuss? Yes Have you received literature/education prior to this visit about HIV/AIDS? Yes Do you understand the meaning of a Viral Load? Yes Do you understand the meaning of a CD4 count? Yes Lab Values Education/Handout Given No Medication Education/Handout Given No  Sexual History Are you in a current relationship? Yes How long have you been in this relationship? 6 months Are they aware of your diagnosis? Yes Have they been tested for HIV? No Are you currently sexually active? Yes Was this protected intercourse? No When was your last unprotected sex? 03/08/2009 Safe Sex Counseling/Pamphlet Given Sexual History Comments: Pt states her partner is going for testing next week.  Evaluation and Follow-Up INTAKE CHECK LIST: HIV Education, Safe Sex Counseling, Case Management Referral, Juanell Fairly Consent  Prevention For Positives: 03/18/2009   Safe sex practices discussed with patient. Condoms offered. Juanell Fairly Consent: Yes Are you in need of condoms at this time? Yes Our patient has been informed that condoms are always available in this clinic.   Are you involved in any social organization? [facility] Name of Agency: Sharol Roussel   Obstetric/Gynecological History   Last Menstrual Period: menopausal I do have some concerns about medical history given by patient. She stated history of hypertension and diabetes  with  out medications for 3 months.  Her  Hgb A1c is w/i normal range  and her BP today was 124/71 P:82. Tomasita Morrow RN  March 18, 2009 4:40 PM            Prevention For Positives: 03/18/2009   Safe sex practices discussed with patient. Condoms offered.        03/18/2009   Patient was screened for substance abuse and depression. Referal was made as indicated.                      Bus Pass given  Tomasita Morrow RN  March 18, 2009 3:53 PM  Records request sent to [facility] ID.  Immunizations Administered:  PPD Skin Test:    Vaccine Type: PPD    Site: left forearm    Mfr: Merck    Dose: 0.1 ml    Route: ID    Given by: Tomasita Morrow RN    Lot #: [id number]   Laboratory Results   Blood Tests   Date/Time Received: .[facility])  March 18, 2009 3:58 PM  Date/Time Reported: .[facility])  March 18, 2009 3:58 PM   HGBA1C: 5.2%   (Normal Range: Non-Diabetic - 3-6%   Control Diabetic - 6-8%)           Vital Signs:  Patient profile:   45  year old female Pulse rate:   82 / minute BP sitting:   124 / 71  (right arm)  Vitals Entered By: Tomasita Morrow RN (March 18, 2009 4:42 PM)

## 2010-03-31 NOTE — Progress Notes (Signed)
Summary: [place]/pt assist meds arrived for Feb  Phone Note Refill Request      Prescriptions: OMEPRAZOLE 40 MG CPDR (OMEPRAZOLE) take once daily  #30 x 0   Entered by:   Paulo Fruit  BS,CPht II,MPH   Authorized by:   Yisroel Ramming MD   Signed by:   Paulo Fruit  BS,CPht II,MPH on 04/21/2009   Method used:   Samples Given   RxID:   [id number] TRUVADA 200-300 MG TABS (EMTRICITABINE-TENOFOVIR) take one daily  #30 x 0   Entered by:   Paulo Fruit  BS,CPht II,MPH   Authorized by:   Yisroel Ramming MD   Signed by:   Paulo Fruit  BS,CPht II,MPH on 04/21/2009   Method used:   Samples Given   RxID:   [id number] KALETRA 200-50 MG TABS (LOPINAVIR-RITONAVIR) Take 2 tablets by mouth twice a day  #120 x 0   Entered by:   Paulo Fruit  BS,CPht II,MPH   Authorized by:   Yisroel Ramming MD   Signed by:   Paulo Fruit  BS,CPht II,MPH on 04/21/2009   Method used:   Samples Given   RxID:   [id number]   Patient Assist Medication Verification: Medication: Truvada Lot# [id number] Exp Date:08 2014 Tech approval:MLD                Patient Assist Medication Verification: Medication:Omeprazole 40mg  Lot# [id number] Exp Date:May 2012 Tech approval:MLD                Patient Assist Medication Verification: Medication:Kaletra 200/50mg  VQQ#[id number] Exp Date:04 Jan 2012 Tech approval:MLD Call placed to patient with message that assistance medications are ready for pick-up. Left message on VM for patient to call Byrd Hesselbach at [phone] Paulo Fruit  BS,CPht II,MPH  April 21, 2009 2:00 PM

## 2010-03-31 NOTE — Assessment & Plan Note (Signed)
Summary: Walk-in  Pt walked into clinic with c/o headache, back pain and dental pain.  This has been going on for several days. We have no appointments available today.  Appointment made for 5/4.  Pt advised to go to [facility] now for immediate treatment. Patient/caller verbalizes understanding of these instructions.   Agree with above. Julaine Fusi  DO  Jun 28, 2009 2:16 PM

## 2010-03-31 NOTE — Consult Note (Signed)
Summary: [facility]   Imported By: Florinda Marker 09/10/2009 14:41:07  _____________________________________________________________________  External Attachment:    Type:   Image     Comment:   External Document

## 2010-03-31 NOTE — Miscellaneous (Signed)
Summary: Bridge Counselor  Clinical Lists Changes Pt called BC today to let [facility] know that she did attend her appointment with [facility]. Pt has finally admitted her substance abuse problem and has agreed to inpt Tx. Pt is scheduled to check in at [facility] on 08/18/09 at 8am. [facility] reminded pt to take all of her medications with her. Pt seems sincere about getting Tx. BC will close pt's file with [facility].  Sharol Roussel  August 18, 2009 9:52 AM

## 2010-03-31 NOTE — Progress Notes (Signed)
Summary: [place]pt [place] meds arrived for Mar  Phone Note Refill Request      Prescriptions: OMEPRAZOLE 40 MG CPDR (OMEPRAZOLE) take once daily  #30 x 0   Entered by:   Paulo Fruit  BS,CPht II,MPH   Authorized by:   Yisroel Ramming MD   Signed by:   Paulo Fruit  BS,CPht II,MPH on 05/13/2009   Method used:   Samples Given   RxID:   [id number] TRUVADA 200-300 MG TABS (EMTRICITABINE-TENOFOVIR) take one daily  #30 x 0   Entered by:   Paulo Fruit  BS,CPht II,MPH   Authorized by:   Yisroel Ramming MD   Signed by:   Paulo Fruit  BS,CPht II,MPH on 05/13/2009   Method used:   Samples Given   RxID:   [id number] KALETRA 200-50 MG TABS (LOPINAVIR-RITONAVIR) Take 2 tablets by mouth twice a day  #120 x 0   Entered by:   Paulo Fruit  BS,CPht II,MPH   Authorized by:   Yisroel Ramming MD   Signed by:   Paulo Fruit  BS,CPht II,MPH on 05/13/2009   Method used:   Samples Given   RxID:   [id number]   Patient Assist Medication Verification: Medication: Truvada Lot# [id number] Exp Date:09 2014 Tech approval:MLD                Patient Assist Medication Verification: Medication:Omeprazole 40mg  ZSW#[id number] Exp Date:Jul 2012 Tech approval:MLD                Patient Assist Medication Verification: Medication:Kaletra 200/50mg  TDD#[id number] Exp Date:01 Jan 2012 Tech approval:MLD Call placed to patient with message that assistance medications are ready for pick-up. Left message on VM  to call office Paulo Fruit  BS,CPht II,MPH  May 13, 2009 3:21 PM

## 2010-03-31 NOTE — Progress Notes (Signed)
Summary: ? transferring pharmacies  Phone Note From Pharmacy   Caller: [facility] Summary of Call: Received a message and actually spoke to [facility] pharmacy about Linzie Collin.  They wanted to make sure patient is transferring all medications over to [facility].  [facility] actually has to speak verbally to the patient to make sure that this is what she wants to do because this patient is locked in with Medicaid at [facility].  [facility] has been receiving calls from [facility] requesting patient be unlocked.  Please call Mickey back at [phone] in reference to this.  The patient's phone number has been verified and I believe [facility] is going to call patient as well.  They do show that  medications were shipped out the end of July for patient to our clinic in which patient has picked up. Initial call taken by: Paulo Fruit  BS,CPht II,MPH,  October 07, 2009 3:13 PM  Follow-up for Phone Call        spoke to [facility], about this.  she is going to call Kirsi Hugh and inform her to get in contact with [facility].  Her new cell phone number is [phone] Follow-up by: Paulo Fruit  BS,CPht II,MPH,  October 11, 2009 3:21 PM

## 2010-03-31 NOTE — Progress Notes (Signed)
Summary: RX refills  Phone Note Call from Patient   Caller: Patient Reason for Call: Refill Medication Details for Reason: requesting refills Summary of Call: Pt. was here for labs and told me she needed a refill on her prozaac, seroquel, and trazodone.   Also wants the quantity on her Ensure changed, because she said she is suppose to take three times a day and is only receiving one case per month. Initial call taken by: Wendall Mola CMA Duncan Dull),  August 16, 2009 2:48 PM  Follow-up for Phone Call        does she have a Psychiatrist?  If not she needs to establish at [facility] she can get 2 cases of ensure per month Follow-up by: Yisroel Ramming MD,  August 16, 2009 3:05 PM    Prescriptions: ENSURE  LIQD (NUTRITIONAL SUPPLEMENTS) take one three times daily  #2 cases x 6   Entered by:   Wendall Mola CMA ( AAMA)   Authorized by:   Yisroel Ramming MD   Signed by:   Wendall Mola CMA ( AAMA) on 08/16/2009   Method used:   Print then Give to Patient   RxID:   [id number]

## 2010-03-31 NOTE — Progress Notes (Signed)
  Phone Note Outgoing Call   Call placed by: Theotis Barrio NT II,  June 23, 2009 11:25 AM Call placed to: Patient Details for Reason: APPT FOR GI MEDICAID CARD Summary of Call: CALLED PATIENT- PHONE # INVALID / UNABLE TO REFERR PATIENT -[place] IS ON HER CARD AT THE PRIMARY. LEFT VOICE MESSAGE AT THE SECOND PHONE NUMBER   [phone], FOR THE PATIENT TO CALL BACK AT [phone]. Emiel Kielty NT II  June 23, 2009 11:27 AM

## 2010-03-31 NOTE — Miscellaneous (Signed)
Summary: Updated directions for Kaletra  Clinical Lists Changes  Medications: Changed medication from KALETRA 200-50 MG TABS (LOPINAVIR-RITONAVIR) take one daily to KALETRA 200-50 MG TABS (LOPINAVIR-RITONAVIR) Take 2 tablets by mouth twice a day - Signed Rx of KALETRA 200-50 MG TABS (LOPINAVIR-RITONAVIR) Take 2 tablets by mouth twice a day;  #120 x 0;  Signed;  Entered by: Paulo Fruit  BS,CPht II,MPH;  Authorized by: Yisroel Ramming MD;  Method used: Historical    Prescriptions: KALETRA 200-50 MG TABS (LOPINAVIR-RITONAVIR) Take 2 tablets by mouth twice a day  #120 x 0   Entered by:   Paulo Fruit  BS,CPht II,MPH   Authorized by:   Yisroel Ramming MD   Signed by:   Paulo Fruit  BS,CPht II,MPH on 04/14/2009   Method used:   Historical   RxID:   [id number]  Wrong directions were entered. Paulo Fruit  BS,CPht II,MPH  April 14, 2009 4:47 PM

## 2010-03-31 NOTE — Miscellaneous (Signed)
Summary: [facility]   Imported By: Florinda Marker 01/18/2010 09:45:56  _____________________________________________________________________  External Attachment:    Type:   Image     Comment:   External Document

## 2010-03-31 NOTE — Progress Notes (Signed)
Summary: Pt 's July meds arrived via [facility]  All of patient's medications arrived via [facility]. Sharol Roussel is aware and patient will be by later this afternoon to pick them up along with her mail. Paulo Fruit  BS,CPht II,MPH  September 23, 2009 2:13 PM    Prescription/Samples picked up by: patient and mail today. Paulo Fruit  BS,CPht II,MPH  September 24, 2009 1:52 PM

## 2010-03-31 NOTE — Miscellaneous (Signed)
Summary: Medication Contract  Medication Contract   Imported By: Florinda Marker 04/09/2009 15:57:54  _____________________________________________________________________  External Attachment:    Type:   Image     Comment:   External Document

## 2010-04-06 ENCOUNTER — Encounter (INDEPENDENT_AMBULATORY_CARE_PROVIDER_SITE_OTHER): Payer: Self-pay | Admitting: *Deleted

## 2010-04-06 NOTE — Miscellaneous (Signed)
  Clinical Lists Changes  Observations: Added new observation of INCOMESOURCE: UNKNOWN (03/28/2010 15:46) Added new observation of HOUSEINCOME: 0  (03/28/2010 15:46) Added new observation of #CHILD<18 IN: 0  (03/28/2010 15:46) Added new observation of FAMILYSIZE: 0  (03/28/2010 15:46) Added new observation of HOUSING: Unstable  (03/28/2010 15:46) Added new observation of YEARLYEXPEN: 0  (03/28/2010 15:46)

## 2010-04-14 NOTE — Miscellaneous (Signed)
  Clinical Lists Changes 

## 2010-05-10 LAB — CBC
HCT: 37.5 % (ref 36.0–46.0)
Hemoglobin: 12.9 g/dL (ref 12.0–15.0)
Hemoglobin: 13.6 g/dL (ref 12.0–15.0)
MCH: 33 pg (ref 26.0–34.0)
MCH: 33.1 pg (ref 26.0–34.0)
MCH: 33.2 pg (ref 26.0–34.0)
MCHC: 34.4 g/dL (ref 30.0–36.0)
MCHC: 34.6 g/dL (ref 30.0–36.0)
MCV: 95.9 fL (ref 78.0–100.0)
Platelets: 134 10*3/uL — ABNORMAL LOW (ref 150–400)
Platelets: 138 10*3/uL — ABNORMAL LOW (ref 150–400)
RBC: 3.91 MIL/uL (ref 3.87–5.11)
RBC: 4.11 MIL/uL (ref 3.87–5.11)
RDW: 14.5 % (ref 11.5–15.5)
RDW: 14.6 % (ref 11.5–15.5)
WBC: 4 K/uL (ref 4.0–10.5)

## 2010-05-10 LAB — COMPREHENSIVE METABOLIC PANEL
ALT: 86 U/L — ABNORMAL HIGH (ref 0–35)
AST: 127 U/L — ABNORMAL HIGH (ref 0–37)
AST: 54 U/L — ABNORMAL HIGH (ref 0–37)
AST: 96 U/L — ABNORMAL HIGH (ref 0–37)
Albumin: 3.6 g/dL (ref 3.5–5.2)
Albumin: 3.8 g/dL (ref 3.5–5.2)
Alkaline Phosphatase: 125 U/L — ABNORMAL HIGH (ref 39–117)
BUN: 9 mg/dL (ref 6–23)
CO2: 22 mEq/L (ref 19–32)
CO2: 28 mEq/L (ref 19–32)
Calcium: 9.2 mg/dL (ref 8.4–10.5)
Calcium: 9.2 mg/dL (ref 8.4–10.5)
Chloride: 100 mEq/L (ref 96–112)
Chloride: 105 mEq/L (ref 96–112)
Creatinine, Ser: 0.76 mg/dL (ref 0.4–1.2)
Creatinine, Ser: 0.87 mg/dL (ref 0.4–1.2)
Creatinine, Ser: 1.08 mg/dL (ref 0.4–1.2)
GFR calc Af Amer: >60 mL/min (ref >60)
GFR calc Af Amer: >60 mL/min (ref >60)
GFR calc Af Amer: >60 mL/min (ref >60)
GFR calc non Af Amer: 55 mL/min — ABNORMAL LOW (ref >60)
GFR calc non Af Amer: >60 mL/min (ref >60)
GFR calc non Af Amer: >60 mL/min (ref >60)
Sodium: 136 mEq/L (ref 135–145)
Sodium: 140 mEq/L (ref 135–145)
Total Bilirubin: 0.7 mg/dL (ref 0.3–1.2)
Total Bilirubin: 1.2 mg/dL (ref 0.3–1.2)
Total Protein: 6.7 g/dL (ref 6.0–8.3)

## 2010-05-10 LAB — DIFFERENTIAL
Basophils Absolute: 0 10*3/uL (ref 0.0–0.1)
Basophils Relative: 0 % (ref 0–1)
Eosinophils Absolute: 0.1 K/uL (ref 0.0–0.7)
Eosinophils Relative: 2 % (ref 0–5)
Lymphocytes Relative: 51 % — ABNORMAL HIGH (ref 12–46)
Lymphs Abs: 2 10*3/uL (ref 0.7–4.0)
Monocytes Absolute: 0.4 K/uL (ref 0.1–1.0)
Monocytes Relative: 10 % (ref 3–12)
Neutro Abs: 1.5 10*3/uL — ABNORMAL LOW (ref 1.7–7.7)
Neutrophils Relative %: 37 % — ABNORMAL LOW (ref 43–77)

## 2010-05-10 LAB — BASIC METABOLIC PANEL
BUN: 15 mg/dL (ref 6–23)
Chloride: 105 mEq/L (ref 96–112)
Glucose, Bld: 121 mg/dL — ABNORMAL HIGH (ref 70–99)
Potassium: 4 mEq/L (ref 3.5–5.1)

## 2010-05-10 LAB — AMMONIA: Ammonia: 30 umol/L (ref 11–35)

## 2010-05-10 LAB — GLUCOSE, CAPILLARY
Glucose-Capillary: 135 mg/dL — ABNORMAL HIGH (ref 70–99)
Glucose-Capillary: 75 mg/dL (ref 70–99)
Glucose-Capillary: 77 mg/dL (ref 70–99)
Glucose-Capillary: 78 mg/dL (ref 70–99)
Glucose-Capillary: 78 mg/dL (ref 70–99)
Glucose-Capillary: 81 mg/dL (ref 70–99)
Glucose-Capillary: 90 mg/dL (ref 70–99)
Glucose-Capillary: 91 mg/dL (ref 70–99)
Glucose-Capillary: 95 mg/dL (ref 70–99)
Glucose-Capillary: 95 mg/dL (ref 70–99)

## 2010-05-10 LAB — ETHANOL: Alcohol, Ethyl (B): <5 mg/dL (ref 0–10)

## 2010-05-10 LAB — LIPASE, BLOOD
Lipase: 245 U/L — ABNORMAL HIGH (ref 11–59)
Lipase: 37 U/L (ref 11–59)

## 2010-05-10 LAB — COMPREHENSIVE METABOLIC PANEL WITH GFR
ALT: 48 U/L — ABNORMAL HIGH (ref 0–35)
Alkaline Phosphatase: 72 U/L (ref 39–117)
Glucose, Bld: 89 mg/dL (ref 70–99)
Potassium: 3.8 meq/L (ref 3.5–5.1)
Sodium: 138 meq/L (ref 135–145)
Total Protein: 7.2 g/dL (ref 6.0–8.3)

## 2010-05-10 LAB — LIPID PANEL: VLDL: 32 mg/dL (ref 0–40)

## 2010-05-11 LAB — T-HELPER CELL (CD4) - (RCID CLINIC ONLY)
CD4 % Helper T Cell: 34 % (ref 33–55)
CD4 T Cell Abs: 460 uL (ref 400–2700)

## 2010-05-15 LAB — T-HELPER CELL (CD4) - (RCID CLINIC ONLY)
CD4 % Helper T Cell: 33 % (ref 33–55)
CD4 T Cell Abs: 360 uL — ABNORMAL LOW (ref 400–2700)

## 2010-05-15 LAB — GLUCOSE, CAPILLARY: Glucose-Capillary: 99 mg/dL (ref 70–99)

## 2010-05-17 LAB — DIFFERENTIAL
Basophils Absolute: 0 10*3/uL (ref 0.0–0.1)
Eosinophils Relative: 3 % (ref 0–5)
Lymphocytes Relative: 40 % (ref 12–46)
Lymphs Abs: 1.1 10*3/uL (ref 0.7–4.0)
Neutro Abs: 1.3 10*3/uL — ABNORMAL LOW (ref 1.7–7.7)
Neutrophils Relative %: 48 % (ref 43–77)

## 2010-05-17 LAB — COMPREHENSIVE METABOLIC PANEL
AST: 59 U/L — ABNORMAL HIGH (ref 0–37)
BUN: 10 mg/dL (ref 6–23)
CO2: 29 mEq/L (ref 19–32)
Calcium: 9.2 mg/dL (ref 8.4–10.5)
Creatinine, Ser: 0.99 mg/dL (ref 0.4–1.2)
GFR calc Af Amer: >60 mL/min (ref >60)
GFR calc non Af Amer: >60 mL/min (ref >60)
Total Bilirubin: 0.5 mg/dL (ref 0.3–1.2)

## 2010-05-17 LAB — POCT CARDIAC MARKERS
CKMB, poc: 2.4 ng/mL (ref 1.0–8.0)
Myoglobin, poc: 64.3 ng/mL (ref 12–200)
Troponin i, poc: <0.05 ng/mL (ref 0.00–0.09)

## 2010-05-17 LAB — CBC
HCT: 34.8 % — ABNORMAL LOW (ref 36.0–46.0)
MCHC: 33.7 g/dL (ref 30.0–36.0)
MCV: 94.9 fL (ref 78.0–100.0)
RBC: 3.66 MIL/uL — ABNORMAL LOW (ref 3.87–5.11)

## 2010-05-17 LAB — GLUCOSE, CAPILLARY: Glucose-Capillary: 81 mg/dL (ref 70–99)

## 2010-05-17 LAB — T-HELPER CELL (CD4) - (RCID CLINIC ONLY): CD4 % Helper T Cell: 30 % — ABNORMAL LOW (ref 33–55)

## 2010-05-17 LAB — D-DIMER, QUANTITATIVE: D-Dimer, Quant: 0.27 ug/mL-FEU (ref 0.00–0.48)

## 2010-05-18 LAB — GLUCOSE, CAPILLARY: Glucose-Capillary: 127 mg/dL — ABNORMAL HIGH (ref 70–99)

## 2010-06-01 LAB — POCT PREGNANCY, URINE: Preg Test, Ur: NEGATIVE

## 2010-06-02 LAB — GLUCOSE, CAPILLARY

## 2010-06-03 LAB — URINE CULTURE: Culture: NO GROWTH

## 2010-06-03 LAB — URINE MICROSCOPIC-ADD ON

## 2010-06-03 LAB — CULTURE, BLOOD (ROUTINE X 2): Culture: NO GROWTH

## 2010-06-03 LAB — PROTIME-INR
INR: 1 (ref 0.00–1.49)
Prothrombin Time: 13.1 seconds (ref 11.6–15.2)

## 2010-06-03 LAB — DIFFERENTIAL
Basophils Absolute: 0 10*3/uL (ref 0.0–0.1)
Basophils Relative: 0 % (ref 0–1)
Eosinophils Absolute: 0.1 10*3/uL (ref 0.0–0.7)
Neutro Abs: 1.4 10*3/uL — ABNORMAL LOW (ref 1.7–7.7)
Neutrophils Relative %: 45 % (ref 43–77)

## 2010-06-03 LAB — URINALYSIS, ROUTINE W REFLEX MICROSCOPIC
Leukocytes, UA: NEGATIVE
Nitrite: NEGATIVE
Specific Gravity, Urine: 1.028 (ref 1.005–1.030)
Urobilinogen, UA: 1 mg/dL (ref 0.0–1.0)
pH: 5.5 (ref 5.0–8.0)

## 2010-06-03 LAB — COMPREHENSIVE METABOLIC PANEL
Alkaline Phosphatase: 72 U/L (ref 39–117)
BUN: 11 mg/dL (ref 6–23)
CO2: 27 mEq/L (ref 19–32)
Chloride: 103 mEq/L (ref 96–112)
GFR calc non Af Amer: >60 mL/min (ref >60)
Glucose, Bld: 102 mg/dL — ABNORMAL HIGH (ref 70–99)
Potassium: 3.5 mEq/L (ref 3.5–5.1)
Total Bilirubin: 0.7 mg/dL (ref 0.3–1.2)
Total Protein: 7.2 g/dL (ref 6.0–8.3)

## 2010-06-03 LAB — CBC
HCT: 33 % — ABNORMAL LOW (ref 36.0–46.0)
Hemoglobin: 11.3 g/dL — ABNORMAL LOW (ref 12.0–15.0)
RBC: 3.57 MIL/uL — ABNORMAL LOW (ref 3.87–5.11)
RDW: 15 % (ref 11.5–15.5)

## 2010-06-03 LAB — GLUCOSE, CAPILLARY: Glucose-Capillary: 61 mg/dL — ABNORMAL LOW (ref 70–99)

## 2010-07-15 NOTE — Op Note (Signed)
[facility]  Patient:    Ashley [name], Ashley [name]                         MRN: [id number] Proc. Date: 04/05/00 Adm. Date:  [id number] Attending:  Judeth Cornfield                           Operative Report  INDICATION:  Ms. Clasby has Hepatitis C.  MEDICATIONS:  Versed 2 mg, fentanyl 25 mcg.  DESCRIPTION OF PROCEDURE:  The patient was placed in the supine position.  The liver was percussed.  The skin was prepped with Betadine and cleaned with iodine.  It was injected with 1% lidocaine.  Percutaneous biopsy was obtained via one pass at the ninth right intercostal space peeling 1 cm of the liver cord.  The patient tolerated the procedure well.  IMPRESSION:  Hepatitis C. DD:  04/05/00 TD:  04/06/00 Job: [id number]

## 2010-07-15 NOTE — Op Note (Signed)
[name], [name]                ACCOUNT NO.:  [id number]   MEDICAL RECORD NO.:  [id number]          PATIENT TYPE:  AMB   LOCATION:  SDS                          FACILITY:  [facility]   PHYSICIAN:  Leonie Man, M.D.   DATE OF BIRTH:  Mar 09, 1965   DATE OF PROCEDURE:  DATE OF DISCHARGE:  11/23/2004                                 OPERATIVE REPORT   PREOPERATIVE DIAGNOSIS:  Incarcerated epigastric and ventral hernias.   POSTOPERATIVE DIAGNOSES:  Incarcerated epigastric and ventral hernias.   PROCEDURE:  Repair of ventral hernia with mesh.   SURGEON:  Leonie Man, M.D.   ASSISTANT:  Ms. Magnus Ivan, RNFA   ANESTHESIA:  General.   SPECIMENS:  There were no specimens to the lab.   ESTIMATED BLOOD LOSS:  Blood loss was minimal.   COMPLICATIONS:  None.   DISPOSITION:  Patient to the PACU in good condition.   HISTORY OF PRESENT ILLNESS:  [name] is a 45 year old woman with painful  incarcerated umbilical and epigastric hernias both within 4 cm of each other  on her anterior abdominal wall. She comes to the operating room now after  the risks and potential benefits of surgery have been fully discussed. All  questions answered and consent obtained for repair of these hernias.   PROCEDURE IN DETAIL:  Following the induction of satisfactory general  anesthesia, the patient was positioned supinely and the abdomen prepped and  draped routinely, and a midline incision is carried down over the epigastric  and umbilical hernias carrying down through the skin and subcutaneous  tissues combining the hernias together in a single incision. The intervening  fascia between the hernia is opened and the hernias are reduced. The  falciform ligament involving the epigastric hernia is clamped and tied with  a 2-0 silk. With a hernias both fully exposed, there was some attenuated  fascia to the side. We dissected around all the attenuated fascia. I bridged  the gap with PROCEED mesh using  interrupted 0-Novofil sutures to sew the  mesh in place to the surrounding fascia through the entire circumference of  the defect. The repair was noted to be intact. Sponge and instrument counts  were verified  and then the fascia was closed over the mesh with a running 2-0 Vicryl  suture. The skin was then closed with a 4-0 Monocryl suture reinforced with  Steri-Strips. Sterile dressings applied. Anesthetic reversed and the patient  removed from the operating room to the recovery room in stable condition  having tolerated the procedure well.      Leonie Man, M.D.  Electronically Signed     PB/MEDQ  D:  11/23/2004  T:  11/24/2004  Job:  [id number]

## 2010-07-21 ENCOUNTER — Other Ambulatory Visit: Payer: Self-pay

## 2010-08-04 ENCOUNTER — Ambulatory Visit: Payer: Self-pay | Admitting: Internal Medicine

## 2010-09-06 ENCOUNTER — Encounter: Payer: Self-pay | Admitting: *Deleted

## 2010-09-06 ENCOUNTER — Emergency Department (HOSPITAL_COMMUNITY)
Admission: EM | Admit: 2010-09-06 | Discharge: 2010-09-06 | Disposition: A | Discharge status: Home / Self Care | Payer: Medicaid Other | Attending: Emergency Medicine | Admitting: Emergency Medicine

## 2010-09-06 DIAGNOSIS — M4120 Other idiopathic scoliosis, site unspecified: Secondary | ICD-10-CM | POA: Insufficient documentation

## 2010-09-06 DIAGNOSIS — F17200 Nicotine dependence, unspecified, uncomplicated: Secondary | ICD-10-CM | POA: Insufficient documentation

## 2010-09-06 DIAGNOSIS — W010XXA Fall on same level from slipping, tripping and stumbling without subsequent striking against object, initial encounter: Secondary | ICD-10-CM | POA: Insufficient documentation

## 2010-09-06 DIAGNOSIS — M549 Dorsalgia, unspecified: Secondary | ICD-10-CM | POA: Insufficient documentation

## 2010-09-06 DIAGNOSIS — E119 Type 2 diabetes mellitus without complications: Secondary | ICD-10-CM | POA: Insufficient documentation

## 2010-09-06 DIAGNOSIS — Y92009 Unspecified place in unspecified non-institutional (private) residence as the place of occurrence of the external cause: Secondary | ICD-10-CM | POA: Insufficient documentation

## 2010-09-06 DIAGNOSIS — Z21 Asymptomatic human immunodeficiency virus [HIV] infection status: Secondary | ICD-10-CM | POA: Insufficient documentation

## 2010-09-06 HISTORY — DX: Mononeuropathy, unspecified: G58.9

## 2010-09-06 HISTORY — DX: Scoliosis, unspecified: M41.9

## 2010-09-06 HISTORY — DX: Human immunodeficiency virus (HIV) disease: B20

## 2010-09-06 HISTORY — DX: Asymptomatic human immunodeficiency virus (hiv) infection status: Z21

## 2010-09-06 HISTORY — DX: Anxiety disorder, unspecified: F41.9

## 2010-09-06 HISTORY — DX: Unspecified abdominal hernia without obstruction or gangrene: K46.9

## 2010-09-06 HISTORY — DX: Unspecified viral hepatitis C without hepatic coma: B19.20

## 2010-09-06 MED ORDER — OXYCODONE-ACETAMINOPHEN 5-325 MG PO TABS: 2.0000 | ORAL_TABLET | ORAL | Status: AC | PRN

## 2010-09-06 MED ORDER — METHOCARBAMOL 500 MG PO TABS: 500.0000 mg | ORAL_TABLET | Freq: Two times a day (BID) | ORAL | Status: AC

## 2010-09-06 NOTE — ED Notes (Signed)
Pt alert and oriented x 3. Skin warm and dry. Color pink. Breath sounds clear and equal bilaterally. Strong hand grips bilaterally.

## 2010-09-06 NOTE — ED Notes (Signed)
Pt fell in her bathtub 2 weeks ago. States that she fell on her rt arm.  C/o pain in her neck and bilateral arms. Seen by her MD on 6/27 and given prescription for percocet but is now out. Was supposed to see her MD for follow up today but does not have transportation. Also c/o numbness in arms intermittently.

## 2010-09-06 NOTE — ED Provider Notes (Signed)
History     Chief Complaint  Patient presents with  . Neck Injury   Patient is a 45 y.o. female presenting with fall and back pain.  Fall Incident onset: fell 2 weeks. Incident: pt slipped. She was ambulatory at the scene. Associated symptoms include tingling. Pertinent negatives include no bowel incontinence and no loss of consciousness.  Back Pain  This is a new problem. The current episode started more than 1 week ago. The problem occurs constantly. The problem has not changed since onset.The pain is associated with falling. The pain is present in the thoracic spine and lumbar spine. The quality of the pain is described as stabbing (sharp). Radiates to: sx radiate to fingers. The pain is moderate. The symptoms are aggravated by certain positions. Associated symptoms include tingling. Pertinent negatives include no bowel incontinence, no leg pain, no paresis and no weakness. She has tried analgesics for the symptoms. The treatment provided moderate relief.    Past Medical History  Diagnosis Date  . HIV (human immunodeficiency virus infection)   . Hepatitis C   . Diabetes mellitus   . Anxiety   . Pinched nerve   . Scoliosis   . Hernia     Past Surgical History  Procedure Date  . Cholecystectomy   . Cesarean section   . Hernia repair     Family History  Problem Relation Age of Onset  . Diabetes Mother   . Hypertension Mother     History  Substance Use Topics  . Smoking status: Current Everyday Smoker -- 1.0 packs/day  . Smokeless tobacco: Not on file  . Alcohol Use: No    OB History    Grav Para Term Preterm Abortions TAB SAB Ect Mult Living   2 2              Review of Systems  Gastrointestinal: Negative for bowel incontinence.  Musculoskeletal: Positive for back pain.  Neurological: Positive for tingling. Negative for loss of consciousness and weakness.  All other systems reviewed and are negative.    Physical Exam  BP 122/88  Pulse 86  Temp(Src) 98.4  F (36.9 C) (Oral)  Resp 18  Ht 4\' 7"  (1.397 m)  Wt 145 lb (65.772 kg)  BMI 33.70 kg/m2  SpO2 100%  Physical Exam  Nursing note and vitals reviewed. Constitutional: She is oriented to person, place, and time. She appears well-developed and well-nourished.  HENT:  Head: Normocephalic and atraumatic.  Eyes: Pupils are equal, round, and reactive to light.  Neck: Normal range of motion. Spinous process tenderness and muscular tenderness present. No rigidity. No tracheal deviation present. No thyromegaly present.  Cardiovascular: Normal rate.   Musculoskeletal:       Thoracic back: She exhibits tenderness, bony tenderness, pain and spasm.       Lumbar back: She exhibits tenderness, bony tenderness and pain. She exhibits no deformity.  Neurological: She is alert and oriented to person, place, and time. She has normal strength. No cranial nerve deficit or sensory deficit.  Reflex Scores:      Patellar reflexes are 1+ on the right side and 1+ on the left side. Skin: Skin is warm and dry. No abrasion noted.    ED Course  Procedures  MDM  Pt without neuro findings, will give rx and she will f/u her pcp later this week, had neg xrays 2 weeks ago      Toy Baker, MD 09/06/10 1042

## 2010-10-01 ENCOUNTER — Emergency Department (HOSPITAL_COMMUNITY): Payer: Medicaid Other

## 2010-10-01 ENCOUNTER — Emergency Department (HOSPITAL_COMMUNITY)
Admission: EM | Admit: 2010-10-01 | Discharge: 2010-10-01 | Disposition: A | Discharge status: Home / Self Care | Payer: Medicaid Other | Attending: Emergency Medicine | Admitting: Emergency Medicine

## 2010-10-01 ENCOUNTER — Encounter (HOSPITAL_COMMUNITY): Payer: Self-pay | Admitting: Emergency Medicine

## 2010-10-01 DIAGNOSIS — F17200 Nicotine dependence, unspecified, uncomplicated: Secondary | ICD-10-CM | POA: Insufficient documentation

## 2010-10-01 DIAGNOSIS — T148XXA Other injury of unspecified body region, initial encounter: Secondary | ICD-10-CM

## 2010-10-01 DIAGNOSIS — E119 Type 2 diabetes mellitus without complications: Secondary | ICD-10-CM | POA: Insufficient documentation

## 2010-10-01 DIAGNOSIS — Z21 Asymptomatic human immunodeficiency virus [HIV] infection status: Secondary | ICD-10-CM | POA: Insufficient documentation

## 2010-10-01 DIAGNOSIS — Y92410 Unspecified street and highway as the place of occurrence of the external cause: Secondary | ICD-10-CM | POA: Insufficient documentation

## 2010-10-01 DIAGNOSIS — IMO0002 Reserved for concepts with insufficient information to code with codable children: Secondary | ICD-10-CM | POA: Insufficient documentation

## 2010-10-01 DIAGNOSIS — M542 Cervicalgia: Secondary | ICD-10-CM | POA: Insufficient documentation

## 2010-10-01 MED ORDER — IBUPROFEN 600 MG PO TABS: 600.0000 mg | ORAL_TABLET | Freq: Four times a day (QID) | ORAL | Status: AC | PRN

## 2010-10-01 MED ORDER — CYCLOBENZAPRINE HCL 10 MG PO TABS: 10.0000 mg | ORAL_TABLET | Freq: Two times a day (BID) | ORAL | Status: AC | PRN

## 2010-10-01 MED ORDER — HYDROCODONE-ACETAMINOPHEN 5-325 MG PO TABS
1.0000 | ORAL_TABLET | Freq: Once | ORAL | Status: AC
  Administered 2010-10-01: 1 via ORAL
  Filled 2010-10-01: qty 1

## 2010-10-01 NOTE — ED Notes (Signed)
Pt ambulated with no difficulty.  No dizziness.  Gait steady.
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
## 2010-10-01 NOTE — ED Notes (Addendum)
Report received.  Pt assessed.  No acute distress noted.  Reports she does continue to have some pain in back and neck.  Pt was medicated for pain about 30 min ago.  Warm blanket given, pt repositioned for comfort.
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
## 2010-11-09 ENCOUNTER — Telehealth: Payer: Self-pay | Admitting: *Deleted

## 2010-11-09 NOTE — Telephone Encounter (Signed)
error 

## 2010-12-08 LAB — POCT URINALYSIS DIP (DEVICE)
Bilirubin Urine: NEGATIVE
Ketones, ur: NEGATIVE
Nitrite: NEGATIVE
Operator id: 116391
Protein, ur: NEGATIVE
pH: 6

## 2010-12-08 LAB — RAPID URINE DRUG SCREEN, HOSP PERFORMED
Amphetamines: NOT DETECTED
Barbiturates: NOT DETECTED

## 2010-12-12 LAB — POCT CARDIAC MARKERS
CKMB, poc: 1.2
CKMB, poc: 1.2
Myoglobin, poc: 39.5
Myoglobin, poc: 43.5
Operator id: 272551
Operator id: 277751
Troponin i, poc: <0.05
Troponin i, poc: <0.05

## 2010-12-12 LAB — I-STAT 8, (EC8 V) (CONVERTED LAB)
Acid-Base Excess: 5 — ABNORMAL HIGH
BUN: 10
Bicarbonate: 31.5 — ABNORMAL HIGH
Chloride: 103
Glucose, Bld: 94
HCT: 37
Hemoglobin: 12.6
Operator id: 272551
Potassium: 4.3
Sodium: 139
TCO2: 33
pCO2, Ven: 54.8 — ABNORMAL HIGH
pH, Ven: 7.368 — ABNORMAL HIGH

## 2010-12-12 LAB — D-DIMER, QUANTITATIVE: D-Dimer, Quant: 0.44

## 2010-12-12 LAB — POCT I-STAT CREATININE
Creatinine, Ser: 1
Operator id: 272551

## 2011-03-24 ENCOUNTER — Emergency Department (HOSPITAL_COMMUNITY)
Admission: EM | Admit: 2011-03-24 | Discharge: 2011-03-24 | Disposition: A | Discharge status: Home / Self Care | Payer: Medicaid Other | Attending: Emergency Medicine | Admitting: Emergency Medicine

## 2011-03-24 ENCOUNTER — Encounter (HOSPITAL_COMMUNITY): Payer: Self-pay

## 2011-03-24 ENCOUNTER — Emergency Department (HOSPITAL_COMMUNITY): Payer: Medicaid Other

## 2011-03-24 DIAGNOSIS — B1920 Unspecified viral hepatitis C without hepatic coma: Secondary | ICD-10-CM | POA: Insufficient documentation

## 2011-03-24 DIAGNOSIS — F17200 Nicotine dependence, unspecified, uncomplicated: Secondary | ICD-10-CM | POA: Insufficient documentation

## 2011-03-24 DIAGNOSIS — Z79899 Other long term (current) drug therapy: Secondary | ICD-10-CM | POA: Insufficient documentation

## 2011-03-24 DIAGNOSIS — S82851A Displaced trimalleolar fracture of right lower leg, initial encounter for closed fracture: Secondary | ICD-10-CM

## 2011-03-24 DIAGNOSIS — E119 Type 2 diabetes mellitus without complications: Secondary | ICD-10-CM | POA: Insufficient documentation

## 2011-03-24 DIAGNOSIS — F411 Generalized anxiety disorder: Secondary | ICD-10-CM | POA: Insufficient documentation

## 2011-03-24 DIAGNOSIS — W1789XA Other fall from one level to another, initial encounter: Secondary | ICD-10-CM | POA: Insufficient documentation

## 2011-03-24 DIAGNOSIS — Z21 Asymptomatic human immunodeficiency virus [HIV] infection status: Secondary | ICD-10-CM | POA: Insufficient documentation

## 2011-03-24 DIAGNOSIS — W11XXXA Fall on and from ladder, initial encounter: Secondary | ICD-10-CM

## 2011-03-24 DIAGNOSIS — S82853A Displaced trimalleolar fracture of unspecified lower leg, initial encounter for closed fracture: Secondary | ICD-10-CM | POA: Insufficient documentation

## 2011-03-24 DIAGNOSIS — Y93E9 Activity, other interior property and clothing maintenance: Secondary | ICD-10-CM | POA: Insufficient documentation

## 2011-03-24 MED ORDER — MORPHINE SULFATE 2 MG/ML IJ SOLN: 2.0000 mg | Freq: Once | INTRAMUSCULAR | Status: DC

## 2011-03-24 MED ORDER — PROPOFOL 10 MG/ML IV EMUL
INTRAVENOUS | Status: AC
  Filled 2011-03-24: qty 100

## 2011-03-24 MED ORDER — PROPOFOL 10 MG/ML IV EMUL
INTRAVENOUS | Status: AC
  Filled 2011-03-24: qty 20

## 2011-03-24 MED ORDER — ONDANSETRON HCL 4 MG/2ML IJ SOLN
4.0000 mg | INTRAMUSCULAR | Status: DC | PRN
  Administered 2011-03-24: 4 mg via INTRAVENOUS
  Filled 2011-03-24: qty 2

## 2011-03-24 MED ORDER — MORPHINE SULFATE 4 MG/ML IJ SOLN
4.0000 mg | INTRAMUSCULAR | Status: DC | PRN
  Administered 2011-03-24: 2 mg via INTRAVENOUS
  Administered 2011-03-24 (×2): 4 mg via INTRAVENOUS
  Filled 2011-03-24 (×3): qty 1

## 2011-03-24 MED ORDER — PROPOFOL 10 MG/ML IV EMUL
INTRAVENOUS | Status: AC
  Administered 2011-03-24: 100 mg
  Filled 2011-03-24: qty 20

## 2011-03-24 MED ORDER — OXYCODONE-ACETAMINOPHEN 5-325 MG PO TABS: 1.0000 | ORAL_TABLET | ORAL | Status: AC | PRN

## 2011-03-24 MED ORDER — SODIUM CHLORIDE 0.9 % IV SOLN
INTRAVENOUS | Status: DC
  Administered 2011-03-24: 17:00:00 via INTRAVENOUS

## 2011-03-24 MED ORDER — PROPOFOL BOLUS VIA INFUSION
200.0000 mg | Freq: Once | INTRAVENOUS | Status: DC
  Filled 2011-03-24: qty 200

## 2011-03-24 NOTE — ED Provider Notes (Signed)
History     CSN: [id number]  Arrival date & time 03/24/11  1552   First MD Initiated Contact with Patient 03/24/11 1604      Chief Complaint  Patient presents with  . Ankle Pain    (Consider location/radiation/quality/duration/timing/severity/associated sxs/prior treatment) HPI  Pt was cleaning and was on a ladder cleaning picture frames and the ladder slid and she fell off. She relates she heard a distinct "pop" and her right ankle was twisted. She denies hitting her head, LOC or other injury. She presents via EMS. Pt has severe pain in her ankle  PCP Dr Concepcion Elk  Past Medical History  Diagnosis Date  . HIV (human immunodeficiency virus infection)   . Hepatitis C   . Diabetes mellitus   . Anxiety   . Pinched nerve   . Scoliosis   . Hernia     Past Surgical History  Procedure Date  . Cholecystectomy   . Cesarean section   . Hernia repair     Family History  Problem Relation Age of Onset  . Diabetes Mother   . Hypertension Mother     History  Substance Use Topics  . Smoking status: Current Everyday Smoker -- 1.0 packs/day  . Smokeless tobacco: Not on file  . Alcohol Use: No  Waiting to get disability  OB History    Grav Para Term Preterm Abortions TAB SAB Ect Mult Living   2 2        2       Review of Systems  All other systems reviewed and are negative.    Allergies  Review of patient's allergies indicates no known allergies.  Home Medications   Current Outpatient Rx  Name Route Sig Dispense Refill  . ALPRAZOLAM 0.25 MG PO TABS Oral Take 0.25 mg by mouth 2 (two) times daily as needed. For anxiety    . CLONIDINE HCL 0.2 MG PO TABS Oral Take 0.2 mg by mouth 2 (two) times daily.      Marland Kitchen HYDROCODONE-ACETAMINOPHEN 5-325 MG PO TABS Oral Take 1 tablet by mouth every 6 (six) hours as needed. For pain    . LISINOPRIL 40 MG PO TABS Oral Take 40 mg by mouth daily.      . OLOPATADINE HCL 0.1 % OP SOLN  1 drop 2 (two) times daily.      .  OXYCODONE-ACETAMINOPHEN 5-325 MG PO TABS Oral Take 1 tablet by mouth daily as needed. For pain    . PIOGLITAZONE HCL 30 MG PO TABS Oral Take 30 mg by mouth daily.      . QUETIAPINE FUMARATE ER 300 MG PO TB24 Oral Take 300 mg by mouth daily.      . TRAZODONE HCL 100 MG PO TABS Oral Take 100 mg by mouth at bedtime.      . ETODOLAC 300 MG PO CAPS Oral Take 300 mg by mouth every 8 (eight) hours as needed. pain    . IBUPROFEN 600 MG PO TABS Oral Take 600 mg by mouth every 8 (eight) hours as needed. For pain    . OXYCODONE-ACETAMINOPHEN 5-325 MG PO TABS Oral Take 1 tablet by mouth every 4 (four) hours as needed for pain. 40 tablet 0    BP 142/100  Pulse 80  Temp(Src) 98 F (36.7 C) (Oral)  Resp 18  Ht 4\' 7"  (1.397 m)  Wt 162 lb (73.483 kg)  BMI 37.65 kg/m2  SpO2 100%  Vital signs normal    Physical Exam  Nursing note  and vitals reviewed. Constitutional: She is oriented to person, place, and time. She appears well-developed and well-nourished.  Non-toxic appearance. She does not appear ill. No distress.  HENT:  Head: Normocephalic and atraumatic.  Right Ear: External ear normal.  Left Ear: External ear normal.  Nose: Nose normal. No mucosal edema or rhinorrhea.  Mouth/Throat: Oropharynx is clear and moist and mucous membranes are normal. No dental abscesses or uvula swelling.       Head nontender to palpation  Eyes: Conjunctivae and EOM are normal. Pupils are equal, round, and reactive to light.  Neck: Normal range of motion and full passive range of motion without pain. Neck supple.  Cardiovascular: Normal rate, regular rhythm and normal heart sounds.  Exam reveals no gallop and no friction rub.   No murmur heard. Pulmonary/Chest: Effort normal and breath sounds normal. No respiratory distress. She has no wheezes. She has no rhonchi. She has no rales. She exhibits no tenderness and no crepitus.  Abdominal: Soft. Normal appearance and bowel sounds are normal. She exhibits no distension.  There is no tenderness. There is no rebound and no guarding.  Musculoskeletal: Normal range of motion. She exhibits no edema and no tenderness.       Moves all extremities well. Pt has deformity of her right ankle. Pulses intact, sensation intact. Skin intact  Neurological: She is alert and oriented to person, place, and time. She has normal strength. No cranial nerve deficit.  Skin: Skin is warm, dry and intact. No rash noted. No erythema. No pallor.  Psychiatric: She has a normal mood and affect. Her speech is normal and behavior is normal. Her mood appears not anxious.    ED Course  Procedural sedation Date/Time: 03/24/2011 5:00 PM Performed by: Taylen Wendland L Authorized by: Ward Givens Consent: Verbal consent obtained. Written consent not obtained. Risks and benefits: risks, benefits and alternatives were discussed Consent given by: patient Patient understanding: patient states understanding of the procedure being performed Patient consent: the patient's understanding of the procedure matches consent given Procedure consent: procedure consent matches procedure scheduled Patient identity confirmed: verbally with patient, arm band and hospital-assigned identification number Time out: Immediately prior to procedure a "time out" was called to verify the correct patient, procedure, equipment, support staff and site/side marked as required. Local anesthesia used: no Patient sedated: yes Sedation type: moderate (conscious) sedation Sedatives: propofol Analgesia: morphine Sedation start date/time: 03/24/2011 5:21 PM Sedation end date/time: 03/24/2011 5:29 PM Patient tolerance: Patient tolerated the procedure well with no immediate complications. Comments: Pt placed on monitor with NRM with pulse ox 100%. Pt given propofol by titration and received 100 mg with moderate sedation. Her ankle was reduced by me and I assisted nursing staff in placing a stirrup/posterior splint. Pulse ox remained 100%.  Pt awake, states her pain is better.  Post-reduction xrays ordered.    (including critical care time)  Labs Reviewed - No data to display Dg Chest 1 View  03/24/2011  *RADIOLOGY REPORT*  Clinical Data: Ankle fracture.  CHEST - 1 VIEW  Comparison: 11/11/2009 and 03/01/2009.  Findings: Nodularity anterior aspect of the left first rib felt to be related to overlapping structures and noted previously.No infiltrate, congestive heart failure or pneumothorax.  Mildly tortuous aorta.  Heart size top normal.  Mild central pulmonary vascular prominence.  IMPRESSION: No acute abnormality.  Please see above.  Original Report Authenticated By: Fuller Canada, M.D.   Dg Ankle Complete Right  03/24/2011  *RADIOLOGY REPORT*  Clinical Data:  Fall.  RIGHT ANKLE - COMPLETE 3+ VIEW  Comparison: None.  Findings: Fracture dislocation of the right ankle.  This includes an oblique fracture of the distal right fibula with separation of fracture fragments by over one shaft with.  Transverse fracture of the medial malleolus.  Dislocation of the right tibia with respect to the talus medially and anteriorly.  Evaluation for posterior tibial fracture is limited secondary to overlying fracture fragments.  Joint effusion and soft tissue swelling.  IMPRESSION: Fracture dislocation of the right ankle as noted above.  Original Report Authenticated By: Fuller Canada, M.D.   Dg Ankle Right Port  03/24/2011  *RADIOLOGY REPORT*  Clinical Data: Closed reduction  PORTABLE RIGHT ANKLE - 2 VIEW  Comparison: 03/24/2011  Findings: Interval closed reduction of the right ankle. Overlying bandage material obscures fine bone detail.  Again noted is a fracture involving the medial malleolus and distal fibula.  There has been interval reduction of the tibiotalar joint.  IMPRESSION:  1.  Interval reduction of tibiotalar joint dislocation. 2.  Bilateral malleolar fractures.  Original Report Authenticated By: Rosealee Albee, M.D.     Diagnoses that  have been ruled out:  None  Diagnoses that are still under consideration:  None  Final diagnoses:  Closed displaced trimalleolar fracture of right lower leg  Fall from ladder   New Prescriptions   OXYCODONE-ACETAMINOPHEN (ROXICET) 5-325 MG PER TABLET    Take 1 tablet by mouth every 4 (four) hours as needed for pain.   Plan discharge  Devoria Albe, MD, FACEP     MDM          Ward Givens, MD 03/24/11 [phone]

## 2011-03-24 NOTE — ED Notes (Signed)
Patient is fully awake. .

## 2011-03-24 NOTE — ED Notes (Signed)
Deformity to right ankle, fell off a stool at home

## 2011-06-15 ENCOUNTER — Emergency Department (HOSPITAL_COMMUNITY)
Admission: EM | Admit: 2011-06-15 | Discharge: 2011-06-15 | Disposition: A | Discharge status: Hospice | Payer: Medicaid Other | Attending: Emergency Medicine | Admitting: Emergency Medicine

## 2011-06-15 ENCOUNTER — Encounter (HOSPITAL_COMMUNITY): Payer: Self-pay | Admitting: Emergency Medicine

## 2011-06-15 ENCOUNTER — Emergency Department (HOSPITAL_COMMUNITY): Payer: Medicaid Other

## 2011-06-15 DIAGNOSIS — M79609 Pain in unspecified limb: Secondary | ICD-10-CM | POA: Insufficient documentation

## 2011-06-15 DIAGNOSIS — Z21 Asymptomatic human immunodeficiency virus [HIV] infection status: Secondary | ICD-10-CM | POA: Insufficient documentation

## 2011-06-15 DIAGNOSIS — Z79899 Other long term (current) drug therapy: Secondary | ICD-10-CM | POA: Insufficient documentation

## 2011-06-15 DIAGNOSIS — M25473 Effusion, unspecified ankle: Secondary | ICD-10-CM | POA: Insufficient documentation

## 2011-06-15 DIAGNOSIS — E119 Type 2 diabetes mellitus without complications: Secondary | ICD-10-CM | POA: Insufficient documentation

## 2011-06-15 DIAGNOSIS — M25476 Effusion, unspecified foot: Secondary | ICD-10-CM | POA: Insufficient documentation

## 2011-06-15 DIAGNOSIS — S82843A Displaced bimalleolar fracture of unspecified lower leg, initial encounter for closed fracture: Secondary | ICD-10-CM | POA: Insufficient documentation

## 2011-06-15 DIAGNOSIS — S82891A Other fracture of right lower leg, initial encounter for closed fracture: Secondary | ICD-10-CM

## 2011-06-15 DIAGNOSIS — R609 Edema, unspecified: Secondary | ICD-10-CM | POA: Insufficient documentation

## 2011-06-15 DIAGNOSIS — M25579 Pain in unspecified ankle and joints of unspecified foot: Secondary | ICD-10-CM | POA: Insufficient documentation

## 2011-06-15 DIAGNOSIS — X500XXA Overexertion from strenuous movement or load, initial encounter: Secondary | ICD-10-CM | POA: Insufficient documentation

## 2011-06-15 DIAGNOSIS — Z8619 Personal history of other infectious and parasitic diseases: Secondary | ICD-10-CM | POA: Insufficient documentation

## 2011-06-15 MED ORDER — HYDROCODONE-ACETAMINOPHEN 5-325 MG PO TABS: 1.0000 | ORAL_TABLET | Freq: Four times a day (QID) | ORAL | Status: AC | PRN

## 2011-06-15 MED ORDER — NAPROXEN 500 MG PO TABS: 500.0000 mg | ORAL_TABLET | Freq: Two times a day (BID) | ORAL | Status: DC

## 2011-06-15 NOTE — Progress Notes (Signed)
Orthopedic Tech Progress Note Patient Details:  NAZIYA HEGWOOD 1965/11/22 [id number]  Type of Splint: Short Leg    Cammer, Mickie Bail 06/15/2011, 2:11 PM

## 2011-06-15 NOTE — ED Notes (Signed)
Pt reports right foot/ankle pain onset yesterday. Pt reports broke her right ankle on March 24, 2011.

## 2011-06-15 NOTE — Discharge Instructions (Signed)
Ankle Fracture A fracture is a break in the bone. Most of the time, surgery is not needed for a broken ankle. A cast, splint, ankle brace, or walking boot may be used to heal the break. A broken ankle can heal in 6 to 12 weeks. HOME CARE  Do not walk on your broken ankle until told by your doctor.   Use crutches as told by your doctor.   Keep your ankle raised (elevated) to the level of the chest as much as possible.   Take medicine as told by your doctor.   Do not smoke.   Eat healthy foods.   Get follow-up care as told by your doctor.   If you do not need a cast, splint, brace, or boot:   Apply an ice pack to the ankle as told by your doctor.   If you have an ankle brace or walking boot:   Only remove it as told by your doctor.   Apply an ice pack to the ankle as told by your doctor.   If you have a splint or cast:   Rest your plaster splint or cast only on a pillow until it is fully hardened. It may take 3 days for the plaster to harden.   Do not scratch under the splint or cast.   Do not stick anything inside the splint or cast to scratch your skin.   Keep sand and dirt out of the inside of the splint or cast.   Do not pull out the padding from the splint or cast.   Keep your splint or cast dry and clean. Cover it with a plastic bag during showers.   Check the skin around the splint or cast every day. You may put lotion on sore areas.   Do not put pressure on any part of your cast or splint. It may break.  GET HELP RIGHT AWAY IF:   You have severe pain, burning or stinging.   Your toes turn blue or gray.   Your toes feel cold or numb.   You cannot move your toes.   Your splint or cast is too tight.   Your splint or cast breaks.   There is a bad smell or yellowish white fluid (pus) coming from under the splint or cast.  MAKE SURE YOU:   Understand these instructions.   Will watch your condition.   Will get help right away if you are not doing well  or get worse.  Document Released: 12/11/2008 Document Revised: 02/02/2011 Document Reviewed: 12/16/2008 [facility] Patient Information 2012 [place], [place].  Keep splint in place and dry use crutches do not weight bear on right ankle. Recurrent fracture very important to call Dr. Hilda Lias tomorrow for followup this will require recasting and/or maybe surgery.

## 2011-06-15 NOTE — ED Provider Notes (Addendum)
History     CSN: [id number]  Arrival date & time 06/15/11  1248   First MD Initiated Contact with Patient 06/15/11 1257      Chief Complaint  Patient presents with  . Foot Pain  . Ankle Pain    (Consider location/radiation/quality/duration/timing/severity/associated sxs/prior treatment) Patient is a 46 y.o. female presenting with ankle pain. The history is provided by the patient.  Ankle Pain  The incident occurred yesterday. Injury mechanism: Step down ankle gave out with a twist. Now with pain. The pain is present in the right ankle. The pain is at a severity of 6/10. The pain is moderate. The pain has been fluctuating since onset. Pertinent negatives include no numbness, no inability to bear weight, no loss of motion, no muscle weakness, no loss of sensation and no tingling. Associated symptoms comments: Patient able to bearing on the ankle and walk around..   Status post trimalleolar ankle fracture to the right ankle back in January initially seen at any pain emergency department treated with splint and referred to Dr. Hilda Lias patient reports that [facility] had the leg in a cast up until 2 weeks ago. Past Medical History  Diagnosis Date  . HIV (human immunodeficiency virus infection)   . Hepatitis C   . Diabetes mellitus   . Anxiety   . Pinched nerve   . Scoliosis   . Hernia     Past Surgical History  Procedure Date  . Cholecystectomy   . Cesarean section   . Hernia repair     Family History  Problem Relation Age of Onset  . Diabetes Mother   . Hypertension Mother     History  Substance Use Topics  . Smoking status: Current Everyday Smoker -- 1.0 packs/day  . Smokeless tobacco: Not on file  . Alcohol Use: No    OB History    Grav Para Term Preterm Abortions TAB SAB Ect Mult Living   2 2        2       Review of Systems  Constitutional: Negative for fever.  HENT: Negative for neck pain.   Eyes: Negative for visual disturbance.  Respiratory: Negative for  shortness of breath.   Cardiovascular: Negative for chest pain.  Gastrointestinal: Negative for abdominal pain.  Genitourinary: Negative for dysuria.  Musculoskeletal: Negative for back pain.  Skin: Negative for rash.  Neurological: Negative for tingling, weakness, numbness and headaches.    Allergies  Review of patient's allergies indicates no known allergies.  Home Medications   Current Outpatient Rx  Name Route Sig Dispense Refill  . ALPRAZOLAM 0.25 MG PO TABS Oral Take 0.25 mg by mouth 2 (two) times daily as needed. For anxiety    . CLONIDINE HCL 0.2 MG PO TABS Oral Take 0.2 mg by mouth 2 (two) times daily.      . ETODOLAC 300 MG PO CAPS Oral Take 300 mg by mouth every 8 (eight) hours as needed. pain    . HYDROCODONE-ACETAMINOPHEN 5-325 MG PO TABS Oral Take 1 tablet by mouth every 6 (six) hours as needed. For pain    . IBUPROFEN 600 MG PO TABS Oral Take 600 mg by mouth every 8 (eight) hours as needed. For pain    . LISINOPRIL 40 MG PO TABS Oral Take 40 mg by mouth daily.      . OLOPATADINE HCL 0.1 % OP SOLN  1 drop 2 (two) times daily.      . OXYCODONE-ACETAMINOPHEN 5-325 MG PO TABS Oral Take  1 tablet by mouth daily as needed. For pain    . PIOGLITAZONE HCL 30 MG PO TABS Oral Take 30 mg by mouth daily.      . QUETIAPINE FUMARATE ER 300 MG PO TB24 Oral Take 300 mg by mouth daily.     . TRAZODONE HCL 100 MG PO TABS Oral Take 100 mg by mouth at bedtime.      Marland Kitchen HYDROCODONE-ACETAMINOPHEN 5-325 MG PO TABS Oral Take 1-2 tablets by mouth every 6 (six) hours as needed for pain. 10 tablet 0  . NAPROXEN 500 MG PO TABS Oral Take 1 tablet (500 mg total) by mouth 2 (two) times daily. 14 tablet 0    BP 114/73  Pulse 74  Temp(Src) 98.5 F (36.9 C) (Oral)  Resp 16  SpO2 97%  Physical Exam  Nursing note and vitals reviewed. Constitutional: She is oriented to person, place, and time. She appears well-developed and well-nourished.  HENT:  Head: Normocephalic and atraumatic.  Eyes:  Conjunctivae are normal. Pupils are equal, round, and reactive to light.  Neck: Normal range of motion. Neck supple.  Cardiovascular: Normal rate, regular rhythm and normal heart sounds.   No murmur heard. Pulmonary/Chest: Effort normal and breath sounds normal.  Abdominal: Soft. Bowel sounds are normal. There is no tenderness.  Musculoskeletal: She exhibits edema and tenderness.       Some swelling to right ankle tenderness medially and laterally to the ankle. No obvious deformity no proximal fibula tenderness good cap refill to distal toes good toe movement sensation intact dorsalis pedis pulse 1+.  Neurological: She is alert and oriented to person, place, and time. No cranial nerve deficit. She exhibits normal muscle tone. Coordination normal.  Skin: Skin is warm. No rash noted.    ED Course  Procedures (including critical care time)  Labs Reviewed - No data to display Dg Ankle Complete Right  06/15/2011  *RADIOLOGY REPORT*  Clinical Data: New lateral right ankle and foot pain, post fracture January 2013 with recent cast removal  RIGHT ANKLE - COMPLETE 3+ VIEW  Comparison: 03/24/2011  Findings: Osseous demineralization. Lateral subluxation of the talus. Transverse fracture of medial malleolus, displaced laterally. Oblique distal right fibular fracture, minimally displaced. Small amount of bridging callus is identified at the proximal aspect of the fibular fracture, with remaining fracture lines showing no significant bridging callus. No additional fracture or dislocation.  IMPRESSION: Displaced fractures of the medial and lateral malleoli as above, with only mild bridging callus identified at the proximal portion of the fibular fracture.  Original Report Authenticated By: Lollie Marrow, M.D.   Dg Foot Complete Right  06/15/2011  *RADIOLOGY REPORT*  Clinical Data: Fracture in January 2013, new lateral right ankle and foot pain, recent cast removal  RIGHT FOOT COMPLETE - 3+ VIEW  Comparison:  11/10/2008, right ankle radiographs 03/24/2011  Findings: Osseous demineralization. Joint spaces preserved. Oblique distal right fibular fracture again identified, displaced, with a small amount of callus present laterally with some bridging callus proximally, though the fracture line remains clearly visible. Transverse fracture medial malleolus, slightly displaced, without significant bridging callus. No definite acute fracture, dislocation or bone destruction.  IMPRESSION: Osseous demineralization with mild generalized soft tissue swelling. Subacute fractures of the medial and lateral malleoli are again identified, mildly displaced, without significant bridging callus. No new bony abnormalities.  Original Report Authenticated By: Lollie Marrow, M.D.     1. Ankle fracture, right       MDM   X-rays to pick for  recurrent fracture of the right ankle. Patient with initial try milder injury to the ankle back in January according to patient was treated by Dr. Hilda Lias with the cast was removed a couple weeks ago. Patient says she just stepped down in the ankle gave out. X-rays to pick recurrent fractures will need to be splinted with a sugar tong splint and replaced on crutches and followup with Dr. Hilda Lias. Return injury may require surgery.  Splint applied by the orthopedic tech.     Shelda Jakes, MD 06/15/11 1423  Shelda Jakes, MD 06/15/11 1423  Shelda Jakes, MD 06/15/11 [phone]

## 2011-06-15 NOTE — Progress Notes (Signed)
Orthopedic Tech Progress Note Patient Details:  Ashley [name] 1965/06/24 [id number]  Other Ortho Devices Type of Ortho Device: Crutches Ortho Device Interventions: Adjustment   Cammer, Mickie Bail 06/15/2011, 2:12 PM

## 2011-12-07 ENCOUNTER — Encounter (HOSPITAL_COMMUNITY): Payer: Self-pay | Admitting: Emergency Medicine

## 2011-12-07 ENCOUNTER — Emergency Department (HOSPITAL_COMMUNITY)
Admission: EM | Admit: 2011-12-07 | Discharge: 2011-12-07 | Disposition: A | Discharge status: Home / Self Care | Payer: Medicaid Other | Attending: Emergency Medicine | Admitting: Emergency Medicine

## 2011-12-07 DIAGNOSIS — E119 Type 2 diabetes mellitus without complications: Secondary | ICD-10-CM | POA: Insufficient documentation

## 2011-12-07 DIAGNOSIS — I10 Essential (primary) hypertension: Secondary | ICD-10-CM

## 2011-12-07 DIAGNOSIS — M4120 Other idiopathic scoliosis, site unspecified: Secondary | ICD-10-CM | POA: Insufficient documentation

## 2011-12-07 DIAGNOSIS — F17200 Nicotine dependence, unspecified, uncomplicated: Secondary | ICD-10-CM | POA: Insufficient documentation

## 2011-12-07 DIAGNOSIS — G589 Mononeuropathy, unspecified: Secondary | ICD-10-CM | POA: Insufficient documentation

## 2011-12-07 DIAGNOSIS — B20 Human immunodeficiency virus [HIV] disease: Secondary | ICD-10-CM | POA: Insufficient documentation

## 2011-12-07 DIAGNOSIS — B1920 Unspecified viral hepatitis C without hepatic coma: Secondary | ICD-10-CM | POA: Insufficient documentation

## 2011-12-07 DIAGNOSIS — F411 Generalized anxiety disorder: Secondary | ICD-10-CM | POA: Insufficient documentation

## 2011-12-07 MED ORDER — OXYCODONE-ACETAMINOPHEN 5-325 MG PO TABS: ORAL_TABLET | ORAL | Status: DC

## 2011-12-07 MED ORDER — LISINOPRIL 10 MG PO TABS: 40.0000 mg | ORAL_TABLET | Freq: Every day | ORAL | Status: DC

## 2011-12-07 MED ORDER — PIOGLITAZONE HCL 30 MG PO TABS: 30.0000 mg | ORAL_TABLET | Freq: Every day | ORAL | Status: DC

## 2011-12-07 MED ORDER — CLONIDINE HCL 0.2 MG PO TABS: ORAL_TABLET | ORAL | Status: DC

## 2011-12-07 MED ORDER — EMTRICITABINE-TENOFOVIR DF 200-300 MG PO TABS: 1.0000 | ORAL_TABLET | Freq: Every day | ORAL | Status: DC

## 2011-12-07 NOTE — ED Notes (Deleted)
Pt c/o rash in right groin area x 3 days that is itching

## 2011-12-07 NOTE — ED Notes (Signed)
Pt c/o cough X 1 month, pt reports cough is productive with green mucus along with nasal drainage. Pt reports she ran out of her inhaler and her medicaid isn't active right now. Pt also c/o right 1st toe pain, no obvious deformity or swelling seen. Pt c/o right ankle pain, previously broke it in January 2013, no swelling or obvious deformity seen. Pt c/o rash all over body, pt reports it started about a month ago and she is very itchy, she isn't sure if it is from her HIV or not.

## 2011-12-07 NOTE — ED Provider Notes (Signed)
History     CSN: [id number]  Arrival date & time 12/07/11  1157   First MD Initiated Contact with Patient 12/07/11 1409      Chief Complaint  Patient presents with  . Headache  . Toe Pain  . Ankle Pain    (Consider location/radiation/quality/duration/timing/severity/associated sxs/prior treatment) HPI  46 y.o. female in no acute distress presenting for medication refill. Patient was recently incarcerated and released on August 15 since that time she's not had insurance and hadn't has not been able to have any of her medications. Other than chronic pain to feet and groin rash, She denies chest pain, trauma, shortness of breath, palpitations, cough, dysarthria, focal weakness, change in vision. Frontal, moderate and typical of her prior exacerbations. It is not exacerbated by Valsalva.  Past Medical History  Diagnosis Date  . HIV (human immunodeficiency virus infection)   . Hepatitis C   . Diabetes mellitus   . Anxiety   . Pinched nerve   . Scoliosis   . Hernia     Past Surgical History  Procedure Date  . Cholecystectomy   . Cesarean section   . Hernia repair     Family History  Problem Relation Age of Onset  . Diabetes Mother   . Hypertension Mother     History  Substance Use Topics  . Smoking status: Current Every Day Smoker -- 1.0 packs/day  . Smokeless tobacco: Not on file  . Alcohol Use: No    OB History    Grav Para Term Preterm Abortions TAB SAB Ect Mult Living   2 2        2       Review of Systems  Constitutional: Negative for fever.  Respiratory: Negative for shortness of breath.   Cardiovascular: Negative for chest pain.  Gastrointestinal: Negative for nausea, vomiting, abdominal pain and diarrhea.  All other systems reviewed and are negative.    Allergies  Review of patient's allergies indicates no known allergies.  Home Medications   Current Outpatient Rx  Name Route Sig Dispense Refill  . ACETAMINOPHEN 500 MG PO TABS Oral Take  2,000 mg by mouth every 6 (six) hours as needed.    . IBUPROFEN 200 MG PO TABS Oral Take 400 mg by mouth every 6 (six) hours as needed.     . ALPRAZOLAM 0.25 MG PO TABS Oral Take 0.25 mg by mouth 2 (two) times daily as needed. For anxiety    . CLONIDINE HCL 0.2 MG PO TABS Oral Take 0.2 mg by mouth 2 (two) times daily.      . ETODOLAC 300 MG PO CAPS Oral Take 300 mg by mouth every 8 (eight) hours as needed. pain    . HYDROCODONE-ACETAMINOPHEN 5-325 MG PO TABS Oral Take 1 tablet by mouth every 6 (six) hours as needed. For pain    . LISINOPRIL 40 MG PO TABS Oral Take 40 mg by mouth daily.      . OXYCODONE-ACETAMINOPHEN 5-325 MG PO TABS Oral Take 1 tablet by mouth daily as needed. For pain    . PIOGLITAZONE HCL 30 MG PO TABS Oral Take 30 mg by mouth daily.      . QUETIAPINE FUMARATE ER 300 MG PO TB24 Oral Take 300 mg by mouth daily.     . TRAZODONE HCL 100 MG PO TABS Oral Take 100 mg by mouth at bedtime.        BP 171/110  Pulse 83  Temp 98.1 F (36.7 C) (Oral)  Resp  18  SpO2 100%  Physical Exam  Nursing note and vitals reviewed. Constitutional: She is oriented to person, place, and time. She appears well-developed and well-nourished. No distress.  HENT:  Head: Normocephalic and atraumatic.  Right Ear: External ear normal.  Mouth/Throat: Oropharynx is clear and moist.  Eyes: Conjunctivae normal and EOM are normal. Pupils are equal, round, and reactive to light.  Neck: Normal range of motion. No JVD present.  Cardiovascular: Normal rate, regular rhythm, normal heart sounds and intact distal pulses.   Pulmonary/Chest: Effort normal and breath sounds normal. No stridor. No respiratory distress. She has no wheezes. She has no rales. She exhibits no tenderness.  Abdominal: Soft. Bowel sounds are normal. She exhibits no distension and no mass. There is no tenderness. There is no rebound and no guarding.  Musculoskeletal: Normal range of motion. She exhibits no edema and no tenderness.    Neurological: She is alert and oriented to person, place, and time.       No facial asymmetry, dysarthria, strength is 5 out of 5x4 extremities, gait is coordinated.  Skin:       Ingrown hair to left labia. No fluctuance no induration.  Psychiatric: She has a normal mood and affect.    ED Course  Procedures (including critical care time)  Labs Reviewed - No data to display No results found.   1. HYPERTENSION   2. Human immunodeficiency virus (HIV) disease       MDM  Patient for presenting for medication refill. She has been out of her medications for approximately 3 weeks. Patient has elevated blood pressure that is asymptomatic at this time. I will refill her chronic meds and right pain control scripts for several days. Patient states that she is working on activating her Medicaid at this point I will give her a resource guide to establish primary care.  Pt verbalized understanding and agrees with care plan. Outpatient follow-up and return precautions given.   New Prescriptions   CLONIDINE (CATAPRES) 0.2 MG TABLET    1 tab po tid x 2 days, then bid x 2 days, then once daily x 2 days   EMTRICITABINE-TENOFOVIR (TRUVADA) 200-300 MG PER TABLET    Take 1 tablet by mouth daily.   LISINOPRIL (PRINIVIL,ZESTRIL) 10 MG TABLET    Take 4 tablets (40 mg total) by mouth daily.   OXYCODONE-ACETAMINOPHEN (PERCOCET/ROXICET) 5-325 MG PER TABLET    1 to 2 tabs PO q6hrs  PRN for pain   PIOGLITAZONE (ACTOS) 30 MG TABLET    Take 1 tablet (30 mg total) by mouth daily.             Wynetta Emery, PA-C 12/07/11 1712

## 2011-12-07 NOTE — ED Provider Notes (Signed)
Medical screening examination/treatment/procedure(s) were performed by non-physician practitioner and as supervising physician I was immediately available for consultation/collaboration.   Carleene Cooper III, MD 12/07/11 2124

## 2011-12-07 NOTE — ED Notes (Signed)
Pt c/o generalized HA and left toe pain and right ankle pain; pt denies obvious injury

## 2011-12-12 ENCOUNTER — Other Ambulatory Visit: Payer: Medicaid Other

## 2011-12-14 ENCOUNTER — Other Ambulatory Visit: Payer: Self-pay

## 2011-12-19 ENCOUNTER — Encounter: Payer: Self-pay | Admitting: *Deleted

## 2011-12-19 ENCOUNTER — Encounter (HOSPITAL_COMMUNITY): Payer: Self-pay | Admitting: *Deleted

## 2011-12-19 ENCOUNTER — Other Ambulatory Visit: Payer: Self-pay

## 2011-12-19 ENCOUNTER — Other Ambulatory Visit: Payer: Self-pay | Admitting: Internal Medicine

## 2011-12-19 ENCOUNTER — Emergency Department (HOSPITAL_COMMUNITY)
Admission: EM | Admit: 2011-12-19 | Discharge: 2011-12-19 | Disposition: A | Discharge status: Home / Self Care | Payer: Medicaid Other | Attending: Emergency Medicine | Admitting: Emergency Medicine

## 2011-12-19 ENCOUNTER — Encounter: Payer: Self-pay | Admitting: Internal Medicine

## 2011-12-19 ENCOUNTER — Other Ambulatory Visit (HOSPITAL_COMMUNITY): Admission: RE | Admit: 2011-12-19 | Payer: Medicaid Other | Source: Ambulatory Visit | Admitting: Internal Medicine

## 2011-12-19 ENCOUNTER — Ambulatory Visit (INDEPENDENT_AMBULATORY_CARE_PROVIDER_SITE_OTHER): Payer: Self-pay | Admitting: Internal Medicine

## 2011-12-19 VITALS — BP 168/106 | HR 86 | Temp 98.1°F | Ht <= 58 in | Wt 140.0 lb

## 2011-12-19 DIAGNOSIS — Z8739 Personal history of other diseases of the musculoskeletal system and connective tissue: Secondary | ICD-10-CM | POA: Insufficient documentation

## 2011-12-19 DIAGNOSIS — F17200 Nicotine dependence, unspecified, uncomplicated: Secondary | ICD-10-CM | POA: Insufficient documentation

## 2011-12-19 DIAGNOSIS — Z79899 Other long term (current) drug therapy: Secondary | ICD-10-CM

## 2011-12-19 DIAGNOSIS — Z23 Encounter for immunization: Secondary | ICD-10-CM

## 2011-12-19 DIAGNOSIS — B20 Human immunodeficiency virus [HIV] disease: Secondary | ICD-10-CM

## 2011-12-19 DIAGNOSIS — M5380 Other specified dorsopathies, site unspecified: Secondary | ICD-10-CM | POA: Insufficient documentation

## 2011-12-19 DIAGNOSIS — A150 Tuberculosis of lung: Secondary | ICD-10-CM

## 2011-12-19 DIAGNOSIS — F17210 Nicotine dependence, cigarettes, uncomplicated: Secondary | ICD-10-CM | POA: Insufficient documentation

## 2011-12-19 DIAGNOSIS — A159 Respiratory tuberculosis unspecified: Secondary | ICD-10-CM | POA: Insufficient documentation

## 2011-12-19 DIAGNOSIS — E119 Type 2 diabetes mellitus without complications: Secondary | ICD-10-CM | POA: Insufficient documentation

## 2011-12-19 DIAGNOSIS — F411 Generalized anxiety disorder: Secondary | ICD-10-CM | POA: Insufficient documentation

## 2011-12-19 DIAGNOSIS — Z8719 Personal history of other diseases of the digestive system: Secondary | ICD-10-CM | POA: Insufficient documentation

## 2011-12-19 DIAGNOSIS — Z113 Encounter for screening for infections with a predominantly sexual mode of transmission: Secondary | ICD-10-CM

## 2011-12-19 DIAGNOSIS — M62830 Muscle spasm of back: Secondary | ICD-10-CM

## 2011-12-19 LAB — CBC WITH DIFFERENTIAL/PLATELET
Band Neutrophils: 0 % (ref 0–10)
Basophils Absolute: 0 10*3/uL (ref 0.0–0.1)
Basophils Relative: 2 % — ABNORMAL HIGH (ref 0–1)
Eosinophils Absolute: 0.1 10*3/uL (ref 0.0–0.7)
Eosinophils Absolute: 0.2 10*3/uL (ref 0.0–0.7)
Eosinophils Relative: 5 % (ref 0–5)
HCT: 34.7 % — ABNORMAL LOW (ref 36.0–46.0)
Hemoglobin: 12.2 g/dL (ref 12.0–15.0)
Lymphocytes Relative: 42 % (ref 12–46)
Lymphocytes Relative: 49 % — ABNORMAL HIGH (ref 12–46)
Lymphs Abs: 1.2 10*3/uL (ref 0.7–4.0)
MCH: 32 pg (ref 26.0–34.0)
MCHC: 35.2 g/dL (ref 30.0–36.0)
MCV: 91.1 fL (ref 78.0–100.0)
Metamyelocytes Relative: 0 %
Monocytes Absolute: 0.2 10*3/uL (ref 0.1–1.0)
Neutrophils Relative %: 37 % — ABNORMAL LOW (ref 43–77)
Platelets: 130 10*3/uL — ABNORMAL LOW (ref 150–400)
Promyelocytes Absolute: 0 %
RBC: 3.64 MIL/uL — ABNORMAL LOW (ref 3.87–5.11)
RDW: 14.4 % (ref 11.5–15.5)
RDW: 13.9 % (ref 11.5–15.5)
WBC: 2.9 10*3/uL — ABNORMAL LOW (ref 4.0–10.5)

## 2011-12-19 LAB — POCT I-STAT, CHEM 8
Creatinine, Ser: 0.8 mg/dL (ref 0.50–1.10)
Hemoglobin: 12.6 g/dL (ref 12.0–15.0)
Potassium: 3.7 mEq/L (ref 3.5–5.1)
Sodium: 142 mEq/L (ref 135–145)

## 2011-12-19 LAB — URINALYSIS, ROUTINE W REFLEX MICROSCOPIC
Glucose, UA: NEGATIVE mg/dL
Hgb urine dipstick: NEGATIVE
Ketones, ur: NEGATIVE mg/dL
Protein, ur: 30 mg/dL — AB
pH: 6.5 (ref 5.0–8.0)

## 2011-12-19 LAB — LIPID PANEL
HDL: 56 mg/dL (ref >39)
LDL Cholesterol: 82 mg/dL (ref 0–99)
Total CHOL/HDL Ratio: 2.7 Ratio
Triglycerides: 75 mg/dL (ref <150)
VLDL: 15 mg/dL (ref 0–40)

## 2011-12-19 LAB — URINE MICROSCOPIC-ADD ON

## 2011-12-19 LAB — RPR

## 2011-12-19 MED ORDER — MORPHINE SULFATE 4 MG/ML IJ SOLN
4.0000 mg | Freq: Once | INTRAMUSCULAR | Status: AC
  Administered 2011-12-19: 4 mg via INTRAVENOUS
  Filled 2011-12-19: qty 1

## 2011-12-19 MED ORDER — CYCLOBENZAPRINE HCL 10 MG PO TABS: 10.0000 mg | ORAL_TABLET | Freq: Two times a day (BID) | ORAL | Status: DC | PRN

## 2011-12-19 MED ORDER — ONDANSETRON HCL 4 MG/2ML IJ SOLN
4.0000 mg | Freq: Four times a day (QID) | INTRAMUSCULAR | Status: DC | PRN
  Administered 2011-12-19: 4 mg via INTRAVENOUS
  Filled 2011-12-19: qty 2

## 2011-12-19 MED ORDER — TRAMADOL HCL 50 MG PO TABS: 50.0000 mg | ORAL_TABLET | Freq: Four times a day (QID) | ORAL | Status: DC | PRN

## 2011-12-19 NOTE — ED Notes (Signed)
Pt states 2 weeks ago stepped off a curb and since has had lower back pain.  PT is now complaining upper abdominal pain with vomiting and diarrhea since Friday

## 2011-12-19 NOTE — ED Notes (Signed)
Rx given x2 Pt ambulating independently w/ steady gait on d/c in no acute distress, A&Ox4. D/c instructions reviewed w/ pt - pt denies any further questions or concerns at present.   

## 2011-12-19 NOTE — Progress Notes (Signed)
Patient ID: MELESSA COWELL, female   DOB: Sep 26, 1965, 46 y.o.   MRN: [id number] Pt here for labwork.  Requested to speak w/ RN.  Most immediate problem is nausea, vomiting and diarrhea since 12/15/11.  Does not have any of her medications due to release from jail 10/11/11 and no Medicaid.  Has applied but not approved.  Worried about N,V and D.  Requesting to be seen for this current problem.  Has return appt for 01/02/12 w/ Dr. Luciana Axe for HIV f/u.

## 2011-12-19 NOTE — Progress Notes (Signed)
Patient ID: Ashley [name], female   DOB: 1965/11/21, 46 y.o.   MRN: [id number]     [facility]  Patient Active Problem List  Diagnosis  . Human immunodeficiency virus (HIV) disease  . HEPATITIS C  . Type II or unspecified type diabetes mellitus without mention of complication, not stated as uncontrolled  . SCHIZOPHRENIA  . BIPOLAR AFFECTIVE DISORDER, DEPRESSED, SEVERE  . DRUG ABUSE  . PERIPHERAL NEUROPATHY  . HYPERTENSION  . UNSPECIFIED DISORDER TEETH&SUPPORTING STRUCTURES  . ABDOMINAL WALL HERNIA  . BACK PAIN, CHRONIC  . Tuberculosis  . Cigarette smoker    Patient's Medications  New Prescriptions   No medications on file  Previous Medications   ACETAMINOPHEN (TYLENOL) 500 MG TABLET    Take 2,000 mg by mouth every 6 (six) hours as needed.   ALPRAZOLAM (XANAX) 0.25 MG TABLET    Take 0.25 mg by mouth 2 (two) times daily as needed. For anxiety   CLONIDINE (CATAPRES) 0.2 MG TABLET    Take 0.2 mg by mouth 2 (two) times daily.     EMTRICITABINE-TENOFOVIR (TRUVADA) 200-300 MG PER TABLET    Take 1 tablet by mouth daily.   ETODOLAC (LODINE) 300 MG CAPSULE    Take 300 mg by mouth every 8 (eight) hours as needed. pain   HYDROCODONE-ACETAMINOPHEN (NORCO) 5-325 MG PER TABLET    Take 1 tablet by mouth every 6 (six) hours as needed. For pain   IBUPROFEN (ADVIL,MOTRIN) 200 MG TABLET    Take 400 mg by mouth every 6 (six) hours as needed.    LISINOPRIL (PRINIVIL,ZESTRIL) 10 MG TABLET    Take 4 tablets (40 mg total) by mouth daily.   OXYCODONE-ACETAMINOPHEN (PERCOCET) 5-325 MG PER TABLET    Take 1 tablet by mouth daily as needed. For pain   PIOGLITAZONE (ACTOS) 30 MG TABLET    Take 30 mg by mouth daily.     QUETIAPINE (SEROQUEL XR) 300 MG 24 HR TABLET    Take 300 mg by mouth daily.    TRAZODONE (DESYREL) 100 MG TABLET    Take 100 mg by mouth at bedtime.    Modified Medications   No medications on file  Discontinued Medications   CLONIDINE (CATAPRES) 0.2 MG TABLET    1  tab po tid x 2 days, then bid x 2 days, then once daily x 2 days   EMTRICITABINE-TENOFOVIR (TRUVADA) 200-300 MG PER TABLET    Take 1 tablet by mouth daily.   LISINOPRIL (PRINIVIL,ZESTRIL) 40 MG TABLET    Take 40 mg by mouth daily.     OXYCODONE-ACETAMINOPHEN (PERCOCET/ROXICET) 5-325 MG PER TABLET    1 to 2 tabs PO q6hrs  PRN for pain   PIOGLITAZONE (ACTOS) 30 MG TABLET    Take 1 tablet (30 mg total) by mouth daily.    Subjective: Ms. Horsley is in for her first visit here since May of 2011. She has been followed intermittently at [facility] in [place]. She was last there in January of this year and was started on a new regimen of Truvada, Norvir and one other pill she does not recall the name of. She does not recognize Prezista or Reyataz on the medication chart. She recalls that the medications caused significant nausea. She does not recall any recent HIV test results. She was incarcerated for 30 days recently on the old charges of assault, larceny bad checks. Since her release she has not had any medications and has lost her Medicaid eligibility.  She is currently in a drug treatment program and states that she has been sober from crack cocaine for 3 years. She still smokes marijuana and last had alcohol 2 weeks ago. She is working with a Child psychotherapist and Sports coach to try to get her Medicaid reinstated. She is currently homeless but living with her aunt. She has chronic pain and has been off of narcotic pain medications. She is requesting refills of her pain medications today.  Objective: Temp: 98.1 F (36.7 C) (10/22 1340) Temp src: Oral (10/22 1340) BP: 168/106 mmHg (10/22 1340) Pulse Rate: 86  (10/22 1340)  General: Alert and in no distress Oral: Clear Skin: Fine papular rash on forearms Lungs: Clear Cor: Regular S1 and S2 no murmurs Abdomen: Obese, soft and nontender with a healed midline incision and  Lab Results HIV 1 RNA Quant (copies/mL)  Date Value  12/17/2009 34*    08/16/2009 <48 copies/mL   06/11/2009 2360*     CD4 T Cell Abs (cmm)  Date Value  12/17/2009 460   08/16/2009 360*  06/11/2009 350*     Assessment: She has been out of care and off medications for her HIV infection. I will request records from [facility] to see what regimen she was on most recently. Once we have those records, the results of today's blood work and access to prescription medications we can get her back on an appropriate antiretroviral regimen. I talked to her about her chronic pain and addictions and told her that we would not be prescribing narcotic pain medications to her. She seemed accepting of that decision.  Plan: 1. Check. Lab work today 2. Obtain records from [facility] clinic 3. Followup on November 5   Cliffton Asters, MD [facility] [phone] pager   [phone] cell 12/19/2011, 2:03 PM

## 2011-12-19 NOTE — Patient Instructions (Signed)
Pt returning this PM for "sick" visit.

## 2011-12-19 NOTE — ED Provider Notes (Signed)
History     CSN: [id number]  Arrival date & time 12/19/11  1432   First MD Initiated Contact with Patient 12/19/11 1510      Chief Complaint  Patient presents with  . Back Pain    (Consider location/radiation/quality/duration/timing/severity/associated sxs/prior treatment) HPI Comments: This is a 46 year old female, who presents to the emergency department with chief complaint of back pain. She states that she stepped off a curb 2 days ago and hurt her back. She has had chronic back pain. She has not tried taking anything to relieve her symptoms. Her symptoms do not radiate. There is no neurologic deficits. Her pain is 10 out of 10 on arrival.  The history is provided by the patient. No language interpreter was used.    Past Medical History  Diagnosis Date  . HIV (human immunodeficiency virus infection)   . Hepatitis C   . Diabetes mellitus   . Anxiety   . Pinched nerve   . Scoliosis   . Hernia     Past Surgical History  Procedure Date  . Cholecystectomy   . Cesarean section   . Hernia repair     Family History  Problem Relation Age of Onset  . Diabetes Mother   . Hypertension Mother     History  Substance Use Topics  . Smoking status: Current Every Day Smoker -- 1.0 packs/day  . Smokeless tobacco: Never Used  . Alcohol Use: No    OB History    Grav Para Term Preterm Abortions TAB SAB Ect Mult Living   2 2        2       Review of Systems  Constitutional: Negative for fever.  HENT: Negative for rhinorrhea and neck pain.   Eyes: Negative for visual disturbance.  Respiratory: Negative for shortness of breath.   Cardiovascular: Negative for chest pain.  Gastrointestinal: Negative for abdominal pain.  Genitourinary: Negative for dysuria.  Musculoskeletal: Positive for back pain.  Neurological: Negative for weakness, numbness and headaches.  Psychiatric/Behavioral: The patient is not nervous/anxious.   All other systems reviewed and are  negative.    Allergies  Review of patient's allergies indicates no known allergies.  Home Medications   Current Outpatient Rx  Name Route Sig Dispense Refill  . CLONIDINE HCL 0.2 MG PO TABS Oral Take 0.2 mg by mouth daily.    . IBUPROFEN 200 MG PO TABS Oral Take 400 mg by mouth every 6 (six) hours as needed.     Marland Kitchen LISINOPRIL 10 MG PO TABS Oral Take 10 mg by mouth daily.    Marland Kitchen PROMETHAZINE HCL 12.5 MG PO TABS Oral Take 12.5 mg by mouth every 6 (six) hours as needed. For nausea      BP 158/139  Pulse 63  Temp 97.9 F (36.6 C) (Oral)  Resp 16  SpO2 100%  Physical Exam  Nursing note and vitals reviewed. Constitutional: She is oriented to person, place, and time. She appears well-developed and well-nourished.  HENT:  Head: Normocephalic and atraumatic.  Eyes: Conjunctivae normal and EOM are normal. Pupils are equal, round, and reactive to light.  Neck: Normal range of motion. Neck supple.  Cardiovascular: Normal rate, regular rhythm and normal heart sounds.   Pulmonary/Chest: Effort normal and breath sounds normal.  Abdominal: Soft. Bowel sounds are normal.  Musculoskeletal: Normal range of motion.       Thoracic and lumbar paraspinal muscles are tender to palpation  Neurological: She is alert and oriented to person, place, and  time.       Sensation and strength intact bilaterally  Skin: Skin is warm and dry.  Psychiatric: She has a normal mood and affect. Her behavior is normal. Judgment and thought content normal.    ED Course  Procedures (including critical care time)  Labs Reviewed  URINALYSIS, ROUTINE W REFLEX MICROSCOPIC - Abnormal; Notable for the following:    Bilirubin Urine SMALL (*)     Protein, ur 30 (*)     Leukocytes, UA MODERATE (*)     All other components within normal limits  CBC WITH DIFFERENTIAL - Abnormal; Notable for the following:    WBC 3.0 (*)     RBC 3.81 (*)     HCT 34.7 (*)     Platelets 133 (*)     Neutrophils Relative 35 (*)      Lymphocytes Relative 49 (*)     Eosinophils Relative 7 (*)     Basophils Relative 2 (*)     Neutro Abs 1.1 (*)     All other components within normal limits  URINE MICROSCOPIC-ADD ON - Abnormal; Notable for the following:    Squamous Epithelial / LPF FEW (*)     Bacteria, UA MANY (*)     All other components within normal limits  POCT I-STAT, CHEM 8 - Abnormal; Notable for the following:    BUN 4 (*)     All other components within normal limits   Results for orders placed during the hospital encounter of 12/19/11  URINALYSIS, ROUTINE W REFLEX MICROSCOPIC      Component Value Range   Color, Urine YELLOW  YELLOW   APPearance CLEAR  CLEAR   Specific Gravity, Urine 1.026  1.005 - 1.030   pH 6.5  5.0 - 8.0   Glucose, UA NEGATIVE  NEGATIVE mg/dL   Hgb urine dipstick NEGATIVE  NEGATIVE   Bilirubin Urine SMALL (*) NEGATIVE   Ketones, ur NEGATIVE  NEGATIVE mg/dL   Protein, ur 30 (*) NEGATIVE mg/dL   Urobilinogen, UA 1.0  0.0 - 1.0 mg/dL   Nitrite NEGATIVE  NEGATIVE   Leukocytes, UA MODERATE (*) NEGATIVE  CBC WITH DIFFERENTIAL      Component Value Range   WBC 3.0 (*) 4.0 - 10.5 K/uL   RBC 3.81 (*) 3.87 - 5.11 MIL/uL   Hemoglobin 12.2  12.0 - 15.0 g/dL   HCT 45.4 (*) 09.8 - 11.9 %   MCV 91.1  78.0 - 100.0 fL   MCH 32.0  26.0 - 34.0 pg   MCHC 35.2  30.0 - 36.0 g/dL   RDW 14.7  82.9 - 56.2 %   Platelets 133 (*) 150 - 400 K/uL   Neutrophils Relative 35 (*) 43 - 77 %   Lymphocytes Relative 49 (*) 12 - 46 %   Monocytes Relative 7  3 - 12 %   Eosinophils Relative 7 (*) 0 - 5 %   Basophils Relative 2 (*) 0 - 1 %   Band Neutrophils 0  0 - 10 %   Metamyelocytes Relative 0     Myelocytes 0     Promyelocytes Absolute 0     Blasts 0     nRBC 0  0 /100 WBC   Neutro Abs 1.1 (*) 1.7 - 7.7 K/uL   Lymphs Abs 1.4  0.7 - 4.0 K/uL   Monocytes Absolute 0.2  0.1 - 1.0 K/uL   Eosinophils Absolute 0.2  0.0 - 0.7 K/uL   Basophils Absolute 0.1  0.0 - 0.1 K/uL   WBC Morphology ATYPICAL LYMPHOCYTES     URINE MICROSCOPIC-ADD ON      Component Value Range   Squamous Epithelial / LPF FEW (*) RARE   WBC, UA 3-6  <3 WBC/hpf   Bacteria, UA MANY (*) RARE   Urine-Other MUCOUS PRESENT    POCT I-STAT, CHEM 8      Component Value Range   Sodium 142  135 - 145 mEq/L   Potassium 3.7  3.5 - 5.1 mEq/L   Chloride 105  96 - 112 mEq/L   BUN 4 (*) 6 - 23 mg/dL   Creatinine, Ser 4.09  0.50 - 1.10 mg/dL   Glucose, Bld 81  70 - 99 mg/dL   Calcium, Ion 8.11  9.14 - 1.23 mmol/L   TCO2 24  0 - 100 mmol/L   Hemoglobin 12.6  12.0 - 15.0 g/dL   HCT 78.2  95.6 - 21.3 %        1. Back spasm       MDM   This 46 year old female with back spasm. Patient has received pain medicine and fluids while in the ER. She states that she is feeling much better. I'm going to discharge the patient with some Toradol and Flexeril. Patient instructed to followup with primary care and to inquire about physical therapy referral. The patient is stable and ready for discharge.       Roxy Horseman, PA-C 12/19/11 2132

## 2011-12-19 NOTE — ED Notes (Signed)
Pt c/o lower back pain X 3 weeks, reports it has gotten worse especially since she has started walking more often. Pt reports its worse after she has been sitting for a long period of time. Pt c/o HA that started today, pt has hx of chronic HA. Pt denies abd pain, sob and chest pain. Pt denies UTI symptoms.

## 2011-12-20 LAB — HIV-1 RNA ULTRAQUANT REFLEX TO GENTYP+: HIV 1 RNA Quant: 52642 copies/mL — ABNORMAL HIGH (ref <20)

## 2011-12-20 LAB — COMPLETE METABOLIC PANEL WITH GFR
ALT: 38 U/L — ABNORMAL HIGH (ref 0–35)
AST: 52 U/L — ABNORMAL HIGH (ref 0–37)
CO2: 27 mEq/L (ref 19–32)
Creat: 0.89 mg/dL (ref 0.50–1.10)
GFR, Est African American: 89 mL/min
Sodium: 141 mEq/L (ref 135–145)
Total Bilirubin: 0.4 mg/dL (ref 0.3–1.2)
Total Protein: 7.1 g/dL (ref 6.0–8.3)

## 2011-12-21 NOTE — ED Provider Notes (Signed)
Medical screening examination/treatment/procedure(s) were performed by non-physician practitioner and as supervising physician I was immediately available for consultation/collaboration.   Gwyneth Sprout, MD 12/21/11 1414

## 2011-12-22 ENCOUNTER — Encounter (HOSPITAL_COMMUNITY): Payer: Self-pay | Admitting: Emergency Medicine

## 2011-12-22 ENCOUNTER — Emergency Department (HOSPITAL_COMMUNITY)
Admission: EM | Admit: 2011-12-22 | Discharge: 2011-12-22 | Disposition: A | Discharge status: Home / Self Care | Payer: Medicaid Other | Attending: Emergency Medicine | Admitting: Emergency Medicine

## 2011-12-22 DIAGNOSIS — F17200 Nicotine dependence, unspecified, uncomplicated: Secondary | ICD-10-CM | POA: Insufficient documentation

## 2011-12-22 DIAGNOSIS — K089 Disorder of teeth and supporting structures, unspecified: Secondary | ICD-10-CM | POA: Insufficient documentation

## 2011-12-22 DIAGNOSIS — R102 Pelvic and perineal pain: Secondary | ICD-10-CM

## 2011-12-22 DIAGNOSIS — F411 Generalized anxiety disorder: Secondary | ICD-10-CM | POA: Insufficient documentation

## 2011-12-22 DIAGNOSIS — Z8619 Personal history of other infectious and parasitic diseases: Secondary | ICD-10-CM | POA: Insufficient documentation

## 2011-12-22 DIAGNOSIS — M79609 Pain in unspecified limb: Secondary | ICD-10-CM | POA: Insufficient documentation

## 2011-12-22 DIAGNOSIS — K0889 Other specified disorders of teeth and supporting structures: Secondary | ICD-10-CM

## 2011-12-22 DIAGNOSIS — E119 Type 2 diabetes mellitus without complications: Secondary | ICD-10-CM | POA: Insufficient documentation

## 2011-12-22 DIAGNOSIS — Z79899 Other long term (current) drug therapy: Secondary | ICD-10-CM | POA: Insufficient documentation

## 2011-12-22 DIAGNOSIS — M4120 Other idiopathic scoliosis, site unspecified: Secondary | ICD-10-CM | POA: Insufficient documentation

## 2011-12-22 DIAGNOSIS — N949 Unspecified condition associated with female genital organs and menstrual cycle: Secondary | ICD-10-CM | POA: Insufficient documentation

## 2011-12-22 DIAGNOSIS — B20 Human immunodeficiency virus [HIV] disease: Secondary | ICD-10-CM | POA: Insufficient documentation

## 2011-12-22 DIAGNOSIS — K469 Unspecified abdominal hernia without obstruction or gangrene: Secondary | ICD-10-CM | POA: Insufficient documentation

## 2011-12-22 LAB — PREGNANCY, URINE: Preg Test, Ur: NEGATIVE

## 2011-12-22 LAB — URINALYSIS, ROUTINE W REFLEX MICROSCOPIC
Nitrite: NEGATIVE
Specific Gravity, Urine: 1.022 (ref 1.005–1.030)
Urobilinogen, UA: 1 mg/dL (ref 0.0–1.0)

## 2011-12-22 LAB — GLUCOSE, CAPILLARY: Glucose-Capillary: 81 mg/dL (ref 70–99)

## 2011-12-22 LAB — URINE MICROSCOPIC-ADD ON

## 2011-12-22 LAB — WET PREP, GENITAL: WBC, Wet Prep HPF POC: NONE SEEN

## 2011-12-22 MED ORDER — ACETAMINOPHEN 325 MG PO TABS
975.0000 mg | ORAL_TABLET | Freq: Once | ORAL | Status: AC
  Administered 2011-12-22: 975 mg via ORAL
  Filled 2011-12-22: qty 3

## 2011-12-22 MED ORDER — ACETAMINOPHEN 500 MG PO TABS: 500.0000 mg | ORAL_TABLET | Freq: Four times a day (QID) | ORAL | Status: DC | PRN

## 2011-12-22 MED ORDER — ACETAMINOPHEN 500 MG PO TABS: 1000.0000 mg | ORAL_TABLET | Freq: Once | ORAL | Status: DC

## 2011-12-22 NOTE — ED Notes (Signed)
LAB called requesting urine preg order to be changed.

## 2011-12-22 NOTE — ED Notes (Signed)
Tooth pain x 2 weeks, toe pain x 1 month, vaginal itching and stinging since yesterday. Denies vaginal discharge.

## 2011-12-22 NOTE — ED Provider Notes (Signed)
Medical screening examination/treatment/procedure(s) were performed by non-physician practitioner and as supervising physician I was immediately available for consultation/collaboration.   Loren Racer, MD 12/22/11 [phone]

## 2011-12-22 NOTE — ED Notes (Signed)
Phoned lab to inquire about urine preg. They did not have record of order. Order re-entered and request sent to lab again.

## 2011-12-22 NOTE — ED Provider Notes (Signed)
History     CSN: [id number]  Arrival date & time 12/22/11  1016   First MD Initiated Contact with Patient 12/22/11 1033      Chief Complaint  Patient presents with  . Dental Pain  . Vaginal Itching  . Toe Pain    (Consider location/radiation/quality/duration/timing/severity/associated sxs/prior treatment) HPI Comments: Patient is a 46 year old female who presents with a 2 week history of dental pain that started gradually and is progressively worsening. The pain is throbbing and located in lower left molar without radiation. The pain is severe and made worse with eating. No alleviating factors. Patient has not tried anything for symptom relief. Patient denies associated symptoms. Patient denies headache, fever, visual changes, sore throat, drainage from the tooth, facial swelling, chest pain, SOB, NVD.   Patient also has a 1 day history of vaginal itching and stinging that started gradually and has been constant since the onset. Her LMP was 17 years ago. She denies abdominal pain, vaginal discharge, abnormal vaginal bleeding, dysuria, hematuria.    Past Medical History  Diagnosis Date  . HIV (human immunodeficiency virus infection)   . Hepatitis C   . Diabetes mellitus   . Anxiety   . Pinched nerve   . Scoliosis   . Hernia     Past Surgical History  Procedure Date  . Cholecystectomy   . Cesarean section   . Hernia repair     Family History  Problem Relation Age of Onset  . Diabetes Mother   . Hypertension Mother     History  Substance Use Topics  . Smoking status: Current Every Day Smoker -- 1.0 packs/day  . Smokeless tobacco: Never Used  . Alcohol Use: No    OB History    Grav Para Term Preterm Abortions TAB SAB Ect Mult Living   2 2        2       Review of Systems  HENT: Positive for dental problem.   Genitourinary: Positive for vaginal pain.  All other systems reviewed and are negative.    Allergies  Review of patient's allergies indicates no  known allergies.  Home Medications   Current Outpatient Rx  Name Route Sig Dispense Refill  . ALPRAZOLAM 0.25 MG PO TABS Oral Take 0.25 mg by mouth daily as needed. anxiety    . CLONIDINE HCL 0.2 MG PO TABS Oral Take 0.2 mg by mouth daily.    . CYCLOBENZAPRINE HCL 10 MG PO TABS Oral Take 10 mg by mouth 2 (two) times daily as needed. Muscle spasms    . FLUOXETINE HCL 40 MG PO CAPS Oral Take 40 mg by mouth daily.    Marland Kitchen HYDROCODONE-ACETAMINOPHEN 7.5-325 MG PO TABS Oral Take 1 tablet by mouth 3 (three) times daily as needed. For pain    . IBUPROFEN 200 MG PO TABS Oral Take 600-800 mg by mouth every 6 (six) hours as needed. For pain    . LISINOPRIL 10 MG PO TABS Oral Take 10 mg by mouth daily.    . OXYCODONE-ACETAMINOPHEN 5-325 MG PO TABS Oral Take 1 tablet by mouth 3 (three) times daily as needed. For pain    . PIOGLITAZONE HCL 30 MG PO TABS Oral Take 30 mg by mouth daily.    Marland Kitchen PROMETHAZINE HCL 12.5 MG PO TABS Oral Take 12.5 mg by mouth every 6 (six) hours as needed. For nausea    . QUETIAPINE FUMARATE 300 MG PO TABS Oral Take 300 mg by mouth at bedtime.    Marland Kitchen  TRAMADOL HCL 50 MG PO TABS Oral Take 50 mg by mouth every 6 (six) hours as needed. For pain    . TRAZODONE HCL 300 MG PO TABS Oral Take 300 mg by mouth at bedtime.      BP 177/108  Pulse 62  SpO2 99%  Physical Exam  Nursing note and vitals reviewed. Constitutional: She is oriented to person, place, and time. She appears well-developed and well-nourished. No distress.  HENT:  Head: Normocephalic and atraumatic.  Mouth/Throat: Oropharynx is clear and moist. No oropharyngeal exudate.  Eyes: Conjunctivae normal and EOM are normal. Pupils are equal, round, and reactive to light. No scleral icterus.  Neck: Normal range of motion. Neck supple.  Cardiovascular: Normal rate and regular rhythm.  Exam reveals no gallop and no friction rub.   No murmur heard. Pulmonary/Chest: Effort normal and breath sounds normal. She has no wheezes. She has  no rales. She exhibits no tenderness.  Abdominal: Soft. She exhibits no distension. There is no tenderness. There is no rebound and no guarding.       Generalized tenderness to palpation. Periumbilical hernia palpated.   Genitourinary:       Scant amount of vaginal discharge. Two cervical lesions noted at the 5 oclock and 7 oclock position--well demarcated areas of erythema that appear non raised. Small amount of fresh blood coming from cervix after GC/Chlamydia probe. Generalized lower abdominal tenderness during bimanual exam. No masses noted.   Musculoskeletal: Normal range of motion.  Neurological: She is alert and oriented to person, place, and time. Coordination normal.       Speech is goal-oriented. Moves limbs without ataxia.   Skin: Skin is warm and dry. She is not diaphoretic.  Psychiatric: She has a normal mood and affect. Her behavior is normal.    ED Course  Procedures (including critical care time)  Labs Reviewed  URINALYSIS, ROUTINE W REFLEX MICROSCOPIC - Abnormal; Notable for the following:    APPearance TURBID (*)     Protein, ur 30 (*)     Leukocytes, UA SMALL (*)     All other components within normal limits  URINE MICROSCOPIC-ADD ON - Abnormal; Notable for the following:    Bacteria, UA FEW (*)     All other components within normal limits  GLUCOSE, CAPILLARY  URINE CULTURE   No results found.   1. Vaginal pain   2. Pain, dental       MDM  11:10 AM Patient will have tylenol for dental pain. Urinalysis, CBG and urine preg pending.  12:29 PM Urinalysis shows no infection. CBG within normal limits.   12:53 PM Pelvic done and wet prep and GC/Chlamydia sent to lab.   1:39 PM GC/Chlamydia pending and patient will be notified in 48 hours with positive results. Wet prep unremarkable. Patient can go home with tylenol for pain and follow up with a dentist within 24 hours, as explained to the patient. Patient should also follow up with OBGYN for further  evaluation of pelvic pain/cervical lesions noted on exam. No further evaluation needed.   Emilia Beck, PA-C 12/22/11 1355

## 2011-12-23 LAB — URINE CULTURE

## 2011-12-23 LAB — GC/CHLAMYDIA PROBE AMP, GENITAL: Chlamydia, DNA Probe: NEGATIVE

## 2011-12-25 LAB — HIV-1 GENOTYPR PLUS

## 2011-12-26 ENCOUNTER — Encounter (HOSPITAL_COMMUNITY): Payer: Self-pay

## 2011-12-26 ENCOUNTER — Emergency Department (HOSPITAL_COMMUNITY)
Admission: EM | Admit: 2011-12-26 | Discharge: 2011-12-26 | Disposition: A | Discharge status: Home / Self Care | Payer: Medicaid Other | Attending: Emergency Medicine | Admitting: Emergency Medicine

## 2011-12-26 DIAGNOSIS — B20 Human immunodeficiency virus [HIV] disease: Secondary | ICD-10-CM | POA: Insufficient documentation

## 2011-12-26 DIAGNOSIS — B1920 Unspecified viral hepatitis C without hepatic coma: Secondary | ICD-10-CM | POA: Insufficient documentation

## 2011-12-26 DIAGNOSIS — Z8719 Personal history of other diseases of the digestive system: Secondary | ICD-10-CM | POA: Insufficient documentation

## 2011-12-26 DIAGNOSIS — K089 Disorder of teeth and supporting structures, unspecified: Secondary | ICD-10-CM | POA: Insufficient documentation

## 2011-12-26 DIAGNOSIS — G589 Mononeuropathy, unspecified: Secondary | ICD-10-CM | POA: Insufficient documentation

## 2011-12-26 DIAGNOSIS — E119 Type 2 diabetes mellitus without complications: Secondary | ICD-10-CM | POA: Insufficient documentation

## 2011-12-26 DIAGNOSIS — Z8781 Personal history of (healed) traumatic fracture: Secondary | ICD-10-CM | POA: Insufficient documentation

## 2011-12-26 DIAGNOSIS — M4120 Other idiopathic scoliosis, site unspecified: Secondary | ICD-10-CM | POA: Insufficient documentation

## 2011-12-26 DIAGNOSIS — Z79899 Other long term (current) drug therapy: Secondary | ICD-10-CM | POA: Insufficient documentation

## 2011-12-26 DIAGNOSIS — F17200 Nicotine dependence, unspecified, uncomplicated: Secondary | ICD-10-CM | POA: Insufficient documentation

## 2011-12-26 DIAGNOSIS — K0889 Other specified disorders of teeth and supporting structures: Secondary | ICD-10-CM

## 2011-12-26 DIAGNOSIS — F411 Generalized anxiety disorder: Secondary | ICD-10-CM | POA: Insufficient documentation

## 2011-12-26 HISTORY — DX: Other fracture of unspecified lower leg, initial encounter for closed fracture: S82.899A

## 2011-12-26 MED ORDER — TRAMADOL HCL 50 MG PO TABS: 50.0000 mg | ORAL_TABLET | Freq: Four times a day (QID) | ORAL | Status: DC | PRN

## 2011-12-26 NOTE — ED Notes (Signed)
Patient c/o left lower tooth pain. No gum swelling or facial swelling noted. patient rates pain 10/10.

## 2011-12-26 NOTE — ED Provider Notes (Signed)
History     CSN: [id number]  Arrival date & time 12/26/11  0719   First MD Initiated Contact with Patient 12/26/11 [phone]      Chief Complaint  Patient presents with  . Dental Pain    (Consider location/radiation/quality/duration/timing/severity/associated sxs/prior treatment) Patient is a 46 y.o. female presenting with tooth pain. The history is provided by the patient.  Dental PainPrimary symptoms do not include headaches, fever or shortness of breath.  Additional symptoms do not include: trouble swallowing.  pt c/o left lower dental pain for the past several days. No injury. Constant, dull, non radiating pain. No fever or chills. No throat pain or swelling. No floor of mouth or neck swelling or pain. No trouble breathing or swallowing. Worse w chewing. No local dentist.   Past Medical History  Diagnosis Date  . HIV (human immunodeficiency virus infection)   . Hepatitis C   . Diabetes mellitus   . Anxiety   . Pinched nerve   . Scoliosis   . Hernia   . Ankle fracture     Past Surgical History  Procedure Date  . Cholecystectomy   . Cesarean section   . Hernia repair   . Liver biopsy     Family History  Problem Relation Age of Onset  . Diabetes Mother   . Hypertension Mother     History  Substance Use Topics  . Smoking status: Current Every Day Smoker -- 0.5 packs/day    Types: Cigarettes  . Smokeless tobacco: Never Used  . Alcohol Use: No    OB History    Grav Para Term Preterm Abortions TAB SAB Ect Mult Living   2 2        2       Review of Systems  Constitutional: Negative for fever.  HENT: Negative for trouble swallowing and neck pain.   Respiratory: Negative for shortness of breath.   Neurological: Negative for headaches.    Allergies  Review of patient's allergies indicates no known allergies.  Home Medications   Current Outpatient Rx  Name Route Sig Dispense Refill  . ACETAMINOPHEN 500 MG PO TABS Oral Take 1 tablet (500 mg total) by mouth  every 6 (six) hours as needed for pain. 30 tablet 0  . ALPRAZOLAM 0.25 MG PO TABS Oral Take 0.25 mg by mouth daily as needed. anxiety    . CLONIDINE HCL 0.2 MG PO TABS Oral Take 0.2 mg by mouth daily.    . CYCLOBENZAPRINE HCL 10 MG PO TABS Oral Take 10 mg by mouth 2 (two) times daily as needed. Muscle spasms    . FLUOXETINE HCL 40 MG PO CAPS Oral Take 40 mg by mouth daily.    Marland Kitchen HYDROCODONE-ACETAMINOPHEN 7.5-325 MG PO TABS Oral Take 1 tablet by mouth 3 (three) times daily as needed. For pain    . IBUPROFEN 200 MG PO TABS Oral Take 600-800 mg by mouth every 6 (six) hours as needed. For pain    . LISINOPRIL 10 MG PO TABS Oral Take 10 mg by mouth daily.    . OXYCODONE-ACETAMINOPHEN 5-325 MG PO TABS Oral Take 1 tablet by mouth 3 (three) times daily as needed. For pain    . PIOGLITAZONE HCL 30 MG PO TABS Oral Take 30 mg by mouth daily.    Marland Kitchen PROMETHAZINE HCL 12.5 MG PO TABS Oral Take 12.5 mg by mouth every 6 (six) hours as needed. For nausea    . QUETIAPINE FUMARATE 300 MG PO TABS Oral Take 300  mg by mouth at bedtime.    . TRAMADOL HCL 50 MG PO TABS Oral Take 50 mg by mouth every 6 (six) hours as needed. For pain    . TRAZODONE HCL 300 MG PO TABS Oral Take 300 mg by mouth at bedtime.      BP 147/99  Pulse 66  Temp 98.7 F (37.1 C) (Oral)  Resp 18  SpO2 100%  Physical Exam  Nursing note and vitals reviewed. Constitutional: She appears well-developed and well-nourished. No distress.  HENT:  Nose: Nose normal.  Mouth/Throat: Oropharynx is clear and moist.       Multiple absent/previously pulled teeth. Left lower tooth ?20, decay/tenderness. Tooth firmly intact. No gum swelling/tenderness. No trismus. No swelling/tenderness to floor of mouth. Pharynx normal. No facial swelling or erythema.   Eyes: Conjunctivae normal are normal. No scleral icterus.  Neck: Neck supple. No tracheal deviation present.  Cardiovascular: Normal rate.   Pulmonary/Chest: Effort normal. No respiratory distress.    Abdominal: Normal appearance.  Musculoskeletal: She exhibits no edema.  Lymphadenopathy:    She has no cervical adenopathy.  Neurological: She is alert.  Skin: Skin is warm and dry. No rash noted.  Psychiatric: She has a normal mood and affect.    ED Course  Procedures (including critical care time)     MDM  No facial or gum swelling, no abscess noted.  Will give rx ultram, discussed need dental follow up - will provide referral.         Suzi Roots, MD 12/26/11 [id number]

## 2012-01-02 ENCOUNTER — Encounter: Payer: Self-pay | Admitting: Internal Medicine

## 2012-01-02 ENCOUNTER — Ambulatory Visit (INDEPENDENT_AMBULATORY_CARE_PROVIDER_SITE_OTHER): Payer: Self-pay | Admitting: Internal Medicine

## 2012-01-02 VITALS — BP 169/108 | HR 64 | Temp 97.9°F | Ht <= 58 in | Wt 137.0 lb

## 2012-01-02 DIAGNOSIS — R102 Pelvic and perineal pain: Secondary | ICD-10-CM

## 2012-01-02 DIAGNOSIS — N949 Unspecified condition associated with female genital organs and menstrual cycle: Secondary | ICD-10-CM

## 2012-01-02 DIAGNOSIS — B20 Human immunodeficiency virus [HIV] disease: Secondary | ICD-10-CM

## 2012-01-02 MED ORDER — EMTRICITABINE-TENOFOVIR DF 200-300 MG PO TABS: 1.0000 | ORAL_TABLET | Freq: Every day | ORAL | Status: DC

## 2012-01-02 MED ORDER — DOLUTEGRAVIR SODIUM 50 MG PO TABS: 50.0000 mg | ORAL_TABLET | Freq: Every day | ORAL | Status: DC

## 2012-01-02 NOTE — Assessment & Plan Note (Signed)
I have no known resistance mutations but there is intolerance with protease inhibitors from what I can understand from the patient. I will therefore start her onTruvada with dolutegravir.  I explained the importance of compliance and close followup. She will get labs in 3-4 weeks in followup after that

## 2012-01-02 NOTE — Progress Notes (Signed)
  Subjective:    Patient ID: Ashley [name], female    DOB: [birth year], 46 y.o.   MRN: [id number]  HPI She comes in for followup of her HIV. She has had sporadic care and more recently was at [facility]. She was seen 2 weeks ago to reestablish care here and baseline labs were done. She does have a CD4 count over 200 and a highly detectable virus. Not have any records yet from [facility]. She tells me though that most of her medication changes in the past have been due to intolerance.   Review of Systems  Constitutional: Negative for fever, fatigue and unexpected weight change.  HENT: Negative for sore throat and mouth sores.   Respiratory: Negative for cough and shortness of breath.   Cardiovascular: Negative for palpitations and leg swelling.  Gastrointestinal: Negative for nausea, abdominal pain and diarrhea.  Musculoskeletal: Negative for myalgias, joint swelling and arthralgias.  Neurological: Negative for dizziness, light-headedness and headaches.       Objective:   Physical Exam  Constitutional: She appears well-developed and well-nourished. No distress.  Cardiovascular: Normal rate, regular rhythm and normal heart sounds.  Exam reveals no gallop and no friction rub.   No murmur heard.         Assessment & Plan:

## 2012-01-02 NOTE — Assessment & Plan Note (Addendum)
She does have pain in her right pelvic area and associated light bloody discharge. No tampon use. She will be referred to gynecology. She had a negative pregnancy test last week. This would not be consistent with ectopic pregnancy with a negative test that recent

## 2012-01-13 ENCOUNTER — Encounter (HOSPITAL_COMMUNITY): Payer: Self-pay | Admitting: Emergency Medicine

## 2012-01-13 ENCOUNTER — Emergency Department (HOSPITAL_COMMUNITY): Payer: Medicaid Other

## 2012-01-13 ENCOUNTER — Inpatient Hospital Stay (HOSPITAL_COMMUNITY)
Admission: EM | Admit: 2012-01-13 | Discharge: 2012-01-16 | DRG: 690 | Disposition: A | Discharge status: Home / Self Care | Payer: Medicaid Other | Attending: Internal Medicine | Admitting: Internal Medicine

## 2012-01-13 DIAGNOSIS — Z720 Tobacco use: Secondary | ICD-10-CM

## 2012-01-13 DIAGNOSIS — K089 Disorder of teeth and supporting structures, unspecified: Secondary | ICD-10-CM

## 2012-01-13 DIAGNOSIS — F1210 Cannabis abuse, uncomplicated: Secondary | ICD-10-CM | POA: Diagnosis present

## 2012-01-13 DIAGNOSIS — F17210 Nicotine dependence, cigarettes, uncomplicated: Secondary | ICD-10-CM

## 2012-01-13 DIAGNOSIS — R509 Fever, unspecified: Secondary | ICD-10-CM

## 2012-01-13 DIAGNOSIS — J069 Acute upper respiratory infection, unspecified: Secondary | ICD-10-CM

## 2012-01-13 DIAGNOSIS — R112 Nausea with vomiting, unspecified: Secondary | ICD-10-CM

## 2012-01-13 DIAGNOSIS — B20 Human immunodeficiency virus [HIV] disease: Secondary | ICD-10-CM

## 2012-01-13 DIAGNOSIS — R102 Pelvic and perineal pain unspecified side: Secondary | ICD-10-CM

## 2012-01-13 DIAGNOSIS — F319 Bipolar disorder, unspecified: Secondary | ICD-10-CM | POA: Diagnosis present

## 2012-01-13 DIAGNOSIS — R7989 Other specified abnormal findings of blood chemistry: Secondary | ICD-10-CM

## 2012-01-13 DIAGNOSIS — A498 Other bacterial infections of unspecified site: Secondary | ICD-10-CM | POA: Diagnosis present

## 2012-01-13 DIAGNOSIS — I10 Essential (primary) hypertension: Secondary | ICD-10-CM

## 2012-01-13 DIAGNOSIS — K439 Ventral hernia without obstruction or gangrene: Secondary | ICD-10-CM

## 2012-01-13 DIAGNOSIS — G609 Hereditary and idiopathic neuropathy, unspecified: Secondary | ICD-10-CM

## 2012-01-13 DIAGNOSIS — B9620 Unspecified Escherichia coli [E. coli] as the cause of diseases classified elsewhere: Secondary | ICD-10-CM

## 2012-01-13 DIAGNOSIS — Z79899 Other long term (current) drug therapy: Secondary | ICD-10-CM

## 2012-01-13 DIAGNOSIS — N390 Urinary tract infection, site not specified: Secondary | ICD-10-CM

## 2012-01-13 DIAGNOSIS — N179 Acute kidney failure, unspecified: Secondary | ICD-10-CM

## 2012-01-13 DIAGNOSIS — E119 Type 2 diabetes mellitus without complications: Secondary | ICD-10-CM

## 2012-01-13 DIAGNOSIS — A159 Respiratory tuberculosis unspecified: Secondary | ICD-10-CM

## 2012-01-13 DIAGNOSIS — M549 Dorsalgia, unspecified: Secondary | ICD-10-CM

## 2012-01-13 DIAGNOSIS — F1910 Other psychoactive substance abuse, uncomplicated: Secondary | ICD-10-CM

## 2012-01-13 DIAGNOSIS — I152 Hypertension secondary to endocrine disorders: Secondary | ICD-10-CM | POA: Diagnosis present

## 2012-01-13 DIAGNOSIS — F209 Schizophrenia, unspecified: Secondary | ICD-10-CM

## 2012-01-13 DIAGNOSIS — F314 Bipolar disorder, current episode depressed, severe, without psychotic features: Secondary | ICD-10-CM

## 2012-01-13 DIAGNOSIS — B1920 Unspecified viral hepatitis C without hepatic coma: Secondary | ICD-10-CM | POA: Diagnosis present

## 2012-01-13 DIAGNOSIS — E1159 Type 2 diabetes mellitus with other circulatory complications: Secondary | ICD-10-CM | POA: Diagnosis present

## 2012-01-13 DIAGNOSIS — N12 Tubulo-interstitial nephritis, not specified as acute or chronic: Principal | ICD-10-CM

## 2012-01-13 DIAGNOSIS — F17200 Nicotine dependence, unspecified, uncomplicated: Secondary | ICD-10-CM | POA: Diagnosis present

## 2012-01-13 DIAGNOSIS — R7881 Bacteremia: Secondary | ICD-10-CM | POA: Diagnosis present

## 2012-01-13 DIAGNOSIS — Z21 Asymptomatic human immunodeficiency virus [HIV] infection status: Secondary | ICD-10-CM | POA: Diagnosis present

## 2012-01-13 DIAGNOSIS — E876 Hypokalemia: Secondary | ICD-10-CM

## 2012-01-13 HISTORY — DX: Essential (primary) hypertension: I10

## 2012-01-13 HISTORY — DX: Other specified abnormal findings of blood chemistry: R79.89

## 2012-01-13 LAB — CBC WITH DIFFERENTIAL/PLATELET
Basophils Absolute: 0 10*3/uL (ref 0.0–0.1)
HCT: 40.1 % (ref 36.0–46.0)
Lymphocytes Relative: 11 % — ABNORMAL LOW (ref 12–46)
Monocytes Absolute: 0.5 10*3/uL (ref 0.1–1.0)
Neutro Abs: 5.7 10*3/uL (ref 1.7–7.7)
Neutrophils Relative %: 81 % — ABNORMAL HIGH (ref 43–77)
RDW: 12.9 % (ref 11.5–15.5)
WBC: 7 10*3/uL (ref 4.0–10.5)

## 2012-01-13 LAB — RAPID URINE DRUG SCREEN, HOSP PERFORMED
Amphetamines: NOT DETECTED
Barbiturates: NOT DETECTED
Tetrahydrocannabinol: POSITIVE — AB

## 2012-01-13 LAB — URINALYSIS, ROUTINE W REFLEX MICROSCOPIC
Glucose, UA: NEGATIVE mg/dL
Protein, ur: 100 mg/dL — AB
Urobilinogen, UA: 1 mg/dL (ref 0.0–1.0)

## 2012-01-13 LAB — LACTIC ACID, PLASMA: Lactic Acid, Venous: 1.2 mmol/L (ref 0.5–2.2)

## 2012-01-13 LAB — COMPREHENSIVE METABOLIC PANEL
ALT: 37 U/L — ABNORMAL HIGH (ref 0–35)
AST: 62 U/L — ABNORMAL HIGH (ref 0–37)
Albumin: 4.3 g/dL (ref 3.5–5.2)
Alkaline Phosphatase: 85 U/L (ref 39–117)
CO2: 22 mEq/L (ref 19–32)
Chloride: 98 mEq/L (ref 96–112)
Creatinine, Ser: 1.54 mg/dL — ABNORMAL HIGH (ref 0.50–1.10)
GFR calc non Af Amer: 39 mL/min — ABNORMAL LOW (ref >90)
Potassium: 3.5 mEq/L (ref 3.5–5.1)
Sodium: 135 mEq/L (ref 135–145)
Total Bilirubin: 0.6 mg/dL (ref 0.3–1.2)

## 2012-01-13 LAB — GLUCOSE, CAPILLARY

## 2012-01-13 LAB — URINE MICROSCOPIC-ADD ON

## 2012-01-13 MED ORDER — DEXTROSE 5 % IV SOLN
1.0000 g | Freq: Once | INTRAVENOUS | Status: DC
  Filled 2012-01-13: qty 10

## 2012-01-13 MED ORDER — EMTRICITABINE-TENOFOVIR DF 200-300 MG PO TABS
1.0000 | ORAL_TABLET | Freq: Every day | ORAL | Status: DC
  Administered 2012-01-14 – 2012-01-16 (×3): 1 via ORAL
  Filled 2012-01-13 (×3): qty 1

## 2012-01-13 MED ORDER — ONDANSETRON HCL 4 MG/2ML IJ SOLN
4.0000 mg | Freq: Once | INTRAMUSCULAR | Status: AC
  Administered 2012-01-13: 4 mg via INTRAVENOUS
  Filled 2012-01-13: qty 2

## 2012-01-13 MED ORDER — SENNA 8.6 MG PO TABS
1.0000 | ORAL_TABLET | Freq: Two times a day (BID) | ORAL | Status: DC
  Administered 2012-01-13 – 2012-01-15 (×3): 8.6 mg via ORAL
  Filled 2012-01-13 (×3): qty 1

## 2012-01-13 MED ORDER — OXYCODONE-ACETAMINOPHEN 5-325 MG PO TABS
1.0000 | ORAL_TABLET | Freq: Three times a day (TID) | ORAL | Status: DC | PRN
  Administered 2012-01-14 – 2012-01-15 (×3): 1 via ORAL
  Filled 2012-01-13 (×3): qty 1

## 2012-01-13 MED ORDER — SODIUM CHLORIDE 0.9 % IV BOLUS (SEPSIS): 1000.0000 mL | Freq: Once | INTRAVENOUS | Status: DC

## 2012-01-13 MED ORDER — CYCLOBENZAPRINE HCL 10 MG PO TABS
10.0000 mg | ORAL_TABLET | Freq: Two times a day (BID) | ORAL | Status: DC | PRN
  Administered 2012-01-13: 10 mg via ORAL
  Filled 2012-01-13: qty 1

## 2012-01-13 MED ORDER — ACETAMINOPHEN 650 MG RE SUPP: 650.0000 mg | Freq: Four times a day (QID) | RECTAL | Status: DC | PRN

## 2012-01-13 MED ORDER — ENOXAPARIN SODIUM 40 MG/0.4ML ~~LOC~~ SOLN
40.0000 mg | Freq: Every day | SUBCUTANEOUS | Status: DC
  Administered 2012-01-13 – 2012-01-15 (×3): 40 mg via SUBCUTANEOUS
  Filled 2012-01-13 (×4): qty 0.4

## 2012-01-13 MED ORDER — ACETAMINOPHEN 325 MG PO TABS
650.0000 mg | ORAL_TABLET | Freq: Four times a day (QID) | ORAL | Status: DC | PRN
  Administered 2012-01-13 – 2012-01-14 (×2): 650 mg via ORAL
  Filled 2012-01-13 (×2): qty 2

## 2012-01-13 MED ORDER — ACETAMINOPHEN 325 MG PO TABS
650.0000 mg | ORAL_TABLET | Freq: Once | ORAL | Status: AC
  Administered 2012-01-13: 650 mg via ORAL
  Filled 2012-01-13: qty 2
  Filled 2012-01-13: qty 1

## 2012-01-13 MED ORDER — SODIUM CHLORIDE 0.9 % IV SOLN
INTRAVENOUS | Status: AC
  Administered 2012-01-13: 1000 mL via INTRAVENOUS
  Administered 2012-01-14: 08:00:00 via INTRAVENOUS

## 2012-01-13 MED ORDER — TRAMADOL HCL 50 MG PO TABS: 50.0000 mg | ORAL_TABLET | Freq: Four times a day (QID) | ORAL | Status: DC | PRN

## 2012-01-13 MED ORDER — POLYETHYLENE GLYCOL 3350 17 G PO PACK
17.0000 g | PACK | Freq: Two times a day (BID) | ORAL | Status: DC
  Administered 2012-01-13 – 2012-01-14 (×2): 17 g via ORAL
  Filled 2012-01-13 (×7): qty 1

## 2012-01-13 MED ORDER — NICOTINE 14 MG/24HR TD PT24
14.0000 mg | MEDICATED_PATCH | Freq: Every day | TRANSDERMAL | Status: DC
  Administered 2012-01-14 – 2012-01-16 (×4): 14 mg via TRANSDERMAL
  Filled 2012-01-13 (×4): qty 1

## 2012-01-13 MED ORDER — DOLUTEGRAVIR SODIUM 50 MG PO TABS
50.0000 mg | ORAL_TABLET | Freq: Every day | ORAL | Status: DC
  Administered 2012-01-14 – 2012-01-16 (×3): 50 mg via ORAL
  Filled 2012-01-13 (×2): qty 1

## 2012-01-13 MED ORDER — BISACODYL 10 MG RE SUPP: 10.0000 mg | Freq: Every day | RECTAL | Status: DC | PRN

## 2012-01-13 MED ORDER — CLONIDINE HCL 0.2 MG PO TABS
0.2000 mg | ORAL_TABLET | Freq: Every day | ORAL | Status: DC
  Administered 2012-01-14 – 2012-01-16 (×3): 0.2 mg via ORAL
  Filled 2012-01-13 (×3): qty 1

## 2012-01-13 MED ORDER — TRAZODONE HCL 150 MG PO TABS
300.0000 mg | ORAL_TABLET | Freq: Every day | ORAL | Status: DC
  Administered 2012-01-13 – 2012-01-15 (×3): 300 mg via ORAL
  Filled 2012-01-13 (×4): qty 2

## 2012-01-13 MED ORDER — ONDANSETRON HCL 4 MG PO TABS: 4.0000 mg | ORAL_TABLET | Freq: Four times a day (QID) | ORAL | Status: DC | PRN

## 2012-01-13 MED ORDER — ONDANSETRON HCL 4 MG/2ML IJ SOLN: 4.0000 mg | Freq: Four times a day (QID) | INTRAMUSCULAR | Status: DC | PRN

## 2012-01-13 MED ORDER — INSULIN ASPART 100 UNIT/ML ~~LOC~~ SOLN: 0.0000 [IU] | Freq: Three times a day (TID) | SUBCUTANEOUS | Status: DC

## 2012-01-13 MED ORDER — DEXTROSE 5 % IV SOLN
1.0000 g | Freq: Every day | INTRAVENOUS | Status: DC
  Administered 2012-01-13 – 2012-01-15 (×3): 1 g via INTRAVENOUS
  Filled 2012-01-13 (×3): qty 10

## 2012-01-13 MED ORDER — PROMETHAZINE HCL 25 MG PO TABS: 12.5000 mg | ORAL_TABLET | Freq: Four times a day (QID) | ORAL | Status: DC | PRN

## 2012-01-13 MED ORDER — ALBUTEROL SULFATE HFA 108 (90 BASE) MCG/ACT IN AERS
2.0000 | INHALATION_SPRAY | Freq: Four times a day (QID) | RESPIRATORY_TRACT | Status: DC | PRN
  Filled 2012-01-13: qty 6.7

## 2012-01-13 MED ORDER — DOCUSATE SODIUM 100 MG PO CAPS
100.0000 mg | ORAL_CAPSULE | Freq: Two times a day (BID) | ORAL | Status: DC
  Administered 2012-01-13 – 2012-01-15 (×3): 100 mg via ORAL
  Filled 2012-01-13 (×7): qty 1

## 2012-01-13 MED ORDER — SODIUM CHLORIDE 0.9 % IV BOLUS (SEPSIS)
1000.0000 mL | Freq: Once | INTRAVENOUS | Status: AC
  Administered 2012-01-13: 1000 mL via INTRAVENOUS

## 2012-01-13 NOTE — ED Notes (Signed)
MD at bedside. 

## 2012-01-13 NOTE — ED Provider Notes (Signed)
History     CSN: [id number]  Arrival date & time 01/13/12  1418   First MD Initiated Contact with Patient 01/13/12 1512      Chief Complaint  Patient presents with  . URI    (Consider location/radiation/quality/duration/timing/severity/associated sxs/prior treatment) Patient is a 46 y.o. female presenting with URI. The history is provided by the patient.  URI The primary symptoms include fever, fatigue, sore throat, cough, nausea and vomiting. Primary symptoms do not include headaches, abdominal pain or rash.  Symptoms associated with the illness include chills.   patient states that she has had fevers and felt bad since Wednesday. He states last night she felt worse and so she didn't get better she go to the ER. She's had some nauseousness and vomiting. She's had a cough and headache. She's had decreased appetite. She states she's been coughing up some sputum. She has known HIV. Her CD4 count was 290 a few weeks ago. No clear sick contacts. Months or throat. She states she's been urinating more frequently also had  Past Medical History  Diagnosis Date  . HIV (human immunodeficiency virus infection)   . Hepatitis C   . Diabetes mellitus   . Anxiety   . Pinched nerve   . Scoliosis   . Hernia   . Ankle fracture   . Hypertension     Past Surgical History  Procedure Date  . Cholecystectomy   . Cesarean section   . Hernia repair   . Liver biopsy     Family History  Problem Relation Age of Onset  . Diabetes Mother   . Hypertension Mother     History  Substance Use Topics  . Smoking status: Current Every Day Smoker -- 0.5 packs/day    Types: Cigarettes  . Smokeless tobacco: Never Used  . Alcohol Use: No    OB History    Grav Para Term Preterm Abortions TAB SAB Ect Mult Living   2 2        2       Review of Systems  Constitutional: Positive for fever, chills, appetite change and fatigue. Negative for activity change.  HENT: Positive for sore throat. Negative  for neck stiffness.   Eyes: Negative for pain.  Respiratory: Positive for cough. Negative for chest tightness and shortness of breath.   Cardiovascular: Negative for chest pain and leg swelling.  Gastrointestinal: Positive for nausea and vomiting. Negative for abdominal pain and diarrhea.  Genitourinary: Positive for frequency. Negative for flank pain.  Musculoskeletal: Negative for back pain.  Skin: Negative for rash.  Neurological: Positive for light-headedness. Negative for weakness, numbness and headaches.  Psychiatric/Behavioral: Negative for behavioral problems.    Allergies  Review of patient's allergies indicates no known allergies.  Home Medications   Current Outpatient Rx  Name  Route  Sig  Dispense  Refill  . ACETAMINOPHEN 500 MG PO TABS   Oral   Take 1 tablet (500 mg total) by mouth every 6 (six) hours as needed for pain.   30 tablet   0   . ALBUTEROL SULFATE HFA 108 (90 BASE) MCG/ACT IN AERS   Inhalation   Inhale 2 puffs into the lungs every 6 (six) hours as needed. Wheezing and shortness of breath         . CLONIDINE HCL 0.2 MG PO TABS   Oral   Take 0.2 mg by mouth daily.         . CYCLOBENZAPRINE HCL 10 MG PO TABS   Oral  Take 10 mg by mouth 2 (two) times daily as needed. Muscle spasms         . DOLUTEGRAVIR SODIUM 50 MG PO TABS   Oral   Take 50 mg by mouth daily.   30 tablet   5   . EMTRICITABINE-TENOFOVIR 200-300 MG PO TABS   Oral   Take 1 tablet by mouth daily.   30 tablet   5   . IBUPROFEN 200 MG PO TABS   Oral   Take 600-800 mg by mouth every 6 (six) hours as needed. For pain         . LISINOPRIL 10 MG PO TABS   Oral   Take 10 mg by mouth daily.         Marland Kitchen PROMETHAZINE HCL 12.5 MG PO TABS   Oral   Take 12.5 mg by mouth every 6 (six) hours as needed. For nausea         . TRAMADOL HCL 50 MG PO TABS   Oral   Take 50 mg by mouth every 6 (six) hours as needed. For pain         . TRAZODONE HCL 300 MG PO TABS   Oral   Take  300 mg by mouth at bedtime.         . OXYCODONE-ACETAMINOPHEN 5-325 MG PO TABS   Oral   Take 1 tablet by mouth 3 (three) times daily as needed. For pain           BP 121/73  Pulse 91  Temp 99.9 F (37.7 C) (Oral)  Resp 20  SpO2 98%  Physical Exam  Nursing note and vitals reviewed. Constitutional: She is oriented to person, place, and time. She appears well-developed and well-nourished.  HENT:  Head: Normocephalic and atraumatic.  Eyes: EOM are normal. Pupils are equal, round, and reactive to light.  Neck: Normal range of motion. Neck supple.       No meningismus  Cardiovascular: Normal rate, regular rhythm and normal heart sounds.   No murmur heard.      Tachycardic  Pulmonary/Chest: Effort normal and breath sounds normal. No respiratory distress. She has no wheezes. She has no rales.  Abdominal: Soft. Bowel sounds are normal. She exhibits no distension. There is no tenderness. There is no rebound and no guarding.  Genitourinary:       No CVA tenderness  Musculoskeletal: Normal range of motion.  Neurological: She is alert and oriented to person, place, and time. No cranial nerve deficit.  Skin: Skin is warm and dry.  Psychiatric: She has a normal mood and affect. Her speech is normal.    ED Course  Procedures (including critical care time)  Labs Reviewed  CBC WITH DIFFERENTIAL - Abnormal; Notable for the following:    Platelets 127 (*)     Neutrophils Relative 81 (*)     Lymphocytes Relative 11 (*)     All other components within normal limits  COMPREHENSIVE METABOLIC PANEL - Abnormal; Notable for the following:    Glucose, Bld 107 (*)     Creatinine, Ser 1.54 (*)     Total Protein 9.6 (*)     AST 62 (*)     ALT 37 (*)     GFR calc non Af Amer 39 (*)     GFR calc Af Amer 45 (*)     All other components within normal limits  URINALYSIS, ROUTINE W REFLEX MICROSCOPIC - Abnormal; Notable for the following:  APPearance CLOUDY (*)     Hgb urine dipstick  MODERATE (*)     Bilirubin Urine SMALL (*)     Ketones, ur TRACE (*)     Protein, ur 100 (*)     Nitrite POSITIVE (*)     Leukocytes, UA MODERATE (*)     All other components within normal limits  URINE MICROSCOPIC-ADD ON - Abnormal; Notable for the following:    Squamous Epithelial / LPF MANY (*)     Bacteria, UA MANY (*)     All other components within normal limits  LACTIC ACID, PLASMA  CULTURE, BLOOD (ROUTINE X 2)  CULTURE, BLOOD (ROUTINE X 2)  URINE CULTURE   Dg Chest 2 View  01/13/2012  *RADIOLOGY REPORT*  Clinical Data: Cough, congestion, fever  CHEST - 2 VIEW  Comparison: 03/24/2011  Findings: Lungs are essentially clear.  No focal consolidation. No pleural effusion or pneumothorax.  Cardiomediastinal silhouette is within normal limits.  Visualized osseous structures are within normal limits.  Cholecystectomy clips.  IMPRESSION: No evidence of acute cardiopulmonary disease.   Original Report Authenticated By: Charline Bills, M.D.      1. Urinary tract infection       MDM  Patient presents with fever. She's had some URI symptoms. Also has some dysuria. Patient was found to have UTI on labs. She has some renal insufficiency. She has HIV but not AIDS. She does not want to be transferred to [facility]  And will be admitted here.        Juliet Rude. Rubin Payor, MD 01/13/12 1900

## 2012-01-13 NOTE — H&P (Signed)
Patient's PCP: Dorrene German, MD Patient's ID physician: Dr. Luciana Axe  Chief Complaint: Fever, nausea and vomiting, and upper respiratory symptoms  History of Present Illness: Ashley [name] is a 46 y.o. African American female with history of HIV last CD4 count on 12/19/2011 was 219, hepatitis C, diabetes appears to be diet controlled, anxiety, and hypertension who presents with the above complaints.  Patient reported that she started taking her HIV medications on Monday, 01/08/2012.  She developed nausea and vomiting since 01/08/2012.  On 01/10/2012 she started having sinus congestion, sore throat and fever.  She said decreasing appetite over the last week as a result she presented to the meds department for further evaluation.  Also since 01/11/2012 she noticed dysuria with urination.  In the emergency department patient had a fever of 103.1.  The hospitalist service was asked to admit the patient for further care and management.  Currently denies any shortness of breath.  Does complain of chest pain with cough.  Cough is productive for yellow sputum.  She reports feeling constipated and not having a bowel movement for the last 3 days.  Denies any abdominal pain.  Denies any headaches or vision changes.  Past Medical History  Diagnosis Date  . HIV (human immunodeficiency virus infection)   . Hepatitis C   . Diabetes mellitus   . Anxiety   . Pinched nerve   . Scoliosis   . Hernia   . Ankle fracture   . Hypertension    Past Surgical History  Procedure Date  . Cholecystectomy   . Cesarean section   . Hernia repair   . Liver biopsy    Family History  Problem Relation Age of Onset  . Diabetes Mother   . Hypertension Mother    History   Social History  . Marital Status: Single    Spouse Name: N/A    Number of Children: N/A  . Years of Education: N/A   Occupational History  . Not on file.   Social History Main Topics  . Smoking status: Current Every Day Smoker -- 0.5 packs/day      Types: Cigarettes  . Smokeless tobacco: Never Used  . Alcohol Use: No  . Drug Use: No  . Sexually Active: Yes    Birth Control/ Protection: Condom   Other Topics Concern  . Not on file   Social History Narrative  . No narrative on file   Allergies: Review of patient's allergies indicates no known allergies.  Home Meds: Prior to Admission medications   Medication Sig Start Date End Date Taking? Authorizing Provider  acetaminophen (TYLENOL) 500 MG tablet Take 1 tablet (500 mg total) by mouth every 6 (six) hours as needed for pain. 12/22/11  Yes Kaitlyn Szekalski, PA-C  albuterol (PROVENTIL HFA;VENTOLIN HFA) 108 (90 BASE) MCG/ACT inhaler Inhale 2 puffs into the lungs every 6 (six) hours as needed. Wheezing and shortness of breath   Yes Historical Provider, MD  cloNIDine (CATAPRES) 0.2 MG tablet Take 0.2 mg by mouth daily.   Yes Historical Provider, MD  cyclobenzaprine (FLEXERIL) 10 MG tablet Take 10 mg by mouth 2 (two) times daily as needed. Muscle spasms 12/19/11  Yes Roxy Horseman, PA-C  Dolutegravir Sodium (TIVICAY) 50 MG TABS Take 50 mg by mouth daily. 01/02/12  Yes Gardiner Barefoot, MD  emtricitabine-tenofovir (TRUVADA) 200-300 MG per tablet Take 1 tablet by mouth daily. 01/02/12  Yes Gardiner Barefoot, MD  ibuprofen (ADVIL,MOTRIN) 200 MG tablet Take 600-800 mg by mouth every 6 (  six) hours as needed. For pain   Yes Historical Provider, MD  lisinopril (PRINIVIL,ZESTRIL) 10 MG tablet Take 10 mg by mouth daily. 12/07/11  Yes Nicole Pisciotta, PA-C  promethazine (PHENERGAN) 12.5 MG tablet Take 12.5 mg by mouth every 6 (six) hours as needed. For nausea   Yes Historical Provider, MD  traMADol (ULTRAM) 50 MG tablet Take 50 mg by mouth every 6 (six) hours as needed. For pain 12/19/11  Yes Roxy Horseman, PA-C  trazodone (DESYREL) 300 MG tablet Take 300 mg by mouth at bedtime.   Yes Historical Provider, MD  oxyCODONE-acetaminophen (PERCOCET/ROXICET) 5-325 MG per tablet Take 1 tablet by mouth 3  (three) times daily as needed. For pain    Historical Provider, MD    Review of Systems: All systems reviewed with the patient and positive as per history of present illness, otherwise all other systems are negative.  Physical Exam: Blood pressure 121/73, pulse 91, temperature 99.9 F (37.7 C), temperature source Oral, resp. rate 20, SpO2 98.00%. General: Awake, Oriented x3, No acute distress. HEENT: EOMI, slightly dry mucous membranes, some mild erythema in the nasopharynx bilaterally, no erythema in the oropharynx. Neck: Supple CV: S1 and S2 Lungs: Clear to ascultation bilaterally Abdomen: Soft, Nontender, Nondistended, +bowel sounds. Ext: Good pulses. Trace edema. No clubbing or cyanosis noted. Neuro: Cranial Nerves II-XII grossly intact. Has 5/5 motor strength in upper and lower extremities.  Lab results:  [facility] 01/13/12 1502  NA 135  K 3.5  CL 98  CO2 22  GLUCOSE 107*  BUN 17  CREATININE 1.54*  CALCIUM 10.1  MG --  PHOS --    Basename 01/13/12 1502  AST 62*  ALT 37*  ALKPHOS 85  BILITOT 0.6  PROT 9.6*  ALBUMIN 4.3   No results found for this basename: LIPASE:2,AMYLASE:2 in the last 72 hours  Basename 01/13/12 1502  WBC 7.0  NEUTROABS 5.7  HGB 14.2  HCT 40.1  MCV 87.9  PLT 127*   No results found for this basename: CKTOTAL:3,CKMB:3,CKMBINDEX:3,TROPONINI:3 in the last 72 hours No components found with this basename: POCBNP:3 No results found for this basename: DDIMER in the last 72 hours No results found for this basename: HGBA1C:2 in the last 72 hours No results found for this basename: CHOL:2,HDL:2,LDLCALC:2,TRIG:2,CHOLHDL:2,LDLDIRECT:2 in the last 72 hours No results found for this basename: TSH,T4TOTAL,FREET3,T3FREE,THYROIDAB in the last 72 hours No results found for this basename: VITAMINB12:2,FOLATE:2,FERRITIN:2,TIBC:2,IRON:2,RETICCTPCT:2 in the last 72 hours Imaging results:  Dg Chest 2 View  01/13/2012  *RADIOLOGY REPORT*  Clinical Data:  Cough, congestion, fever  CHEST - 2 VIEW  Comparison: 03/24/2011  Findings: Lungs are essentially clear.  No focal consolidation. No pleural effusion or pneumothorax.  Cardiomediastinal silhouette is within normal limits.  Visualized osseous structures are within normal limits.  Cholecystectomy clips.  IMPRESSION: No evidence of acute cardiopulmonary disease.   Original Report Authenticated By: Charline Bills, M.D.    Assessment & Plan by Problem: Urinary tract infection Patient started on ceftriaxone.  Urine culture sent.  Change antibiotics depending on urine culture results.  Possible upper respiratory viral infection Chest x-ray negative for infectious etiology.  Send for influenza PCR.  Supportive care with IV fluids.  Fever Likely due to urinary tract infection and possible upper respiratory viral infection.  Blood cultures x2 sent on 01/13/2012.  Acute renal failure Likely due to nausea and vomiting with dehydration and urinary tract infection.  Already on ceftriaxone for urinary tract infection.  Hydrate the patient on IV fluids and reassess renal  function in the morning if not improved consider further workup including renal ultrasound.  Nausea/vomiting Etiology unclear.  Given the patient initiated HIV medications on 01/08/2012 and subsequent nausea since then suspect patient may have nausea from these medications.  Start the patient on clear liquid diet and advance diet as tolerated.  Send urine drug screen given history of drug abuse.  Chronic mild elevation of liver function tests Stable.  Patient also has a history of hepatitis C.  History of schizophrenia/bipolar disorder Stable.  Hypertension Stable.  In the setting of acute renal failure, lisinopril held.  Continue to monitor.  HIV Last CD4 count was 290 on 12/19/2011.  Continue home HIV medications.  If still having persistent nausea and vomiting may consider getting an ID consultation.  Questionable diabetes Last  hemoglobin A1c was 5.2 in April of 2011, sliding scale insulin.  Resend hemoglobin A1c, to assess glycemic control.    Constipation Patient started on bowel regimen.  Tobacco abuse Counseled on cessation.  Nicotine patch ordered.  Prophylaxis Lovenox.  CODE STATUS Patient indicated that she wishes to be DNR/DNI.  This was discussed with the patient at the time of admission.  Communication No family at bedside.  Patient and friend updated at bedside.  Disposition Admit the patient as inpatient to MedSurg.  Time spent on admission, talking to the patient, and coordinating care was: 60 mins.  Marzella Miracle A, MD 01/13/2012, 7:49 PM

## 2012-01-13 NOTE — ED Notes (Signed)
Attempted reports unsuccessfu.

## 2012-01-13 NOTE — ED Notes (Signed)
Per pt states that she is having fever, chills, N/V with cough and headache. Pt does not know if she has had sick contacts. Pt states, "I feel like crap." Pt also reports that she has decreased appetite and cannot tolerate food and liquids.

## 2012-01-14 ENCOUNTER — Encounter (HOSPITAL_COMMUNITY): Payer: Self-pay

## 2012-01-14 DIAGNOSIS — R112 Nausea with vomiting, unspecified: Secondary | ICD-10-CM

## 2012-01-14 DIAGNOSIS — F314 Bipolar disorder, current episode depressed, severe, without psychotic features: Secondary | ICD-10-CM

## 2012-01-14 DIAGNOSIS — E876 Hypokalemia: Secondary | ICD-10-CM | POA: Diagnosis present

## 2012-01-14 DIAGNOSIS — B20 Human immunodeficiency virus [HIV] disease: Secondary | ICD-10-CM

## 2012-01-14 LAB — BASIC METABOLIC PANEL
GFR calc Af Amer: 60 mL/min — ABNORMAL LOW (ref >90)
GFR calc non Af Amer: 52 mL/min — ABNORMAL LOW (ref >90)
Potassium: 3 mEq/L — ABNORMAL LOW (ref 3.5–5.1)
Sodium: 138 mEq/L (ref 135–145)

## 2012-01-14 LAB — CBC
Hemoglobin: 12.2 g/dL (ref 12.0–15.0)
RBC: 3.9 MIL/uL (ref 3.87–5.11)
WBC: 6.9 10*3/uL (ref 4.0–10.5)

## 2012-01-14 LAB — HEMOGLOBIN A1C
Hgb A1c MFr Bld: 5.5 % (ref <5.7)
Mean Plasma Glucose: 111 mg/dL (ref <117)

## 2012-01-14 LAB — GLUCOSE, CAPILLARY
Glucose-Capillary: 85 mg/dL (ref 70–99)
Glucose-Capillary: 121 mg/dL — ABNORMAL HIGH (ref 70–99)

## 2012-01-14 LAB — INFLUENZA PANEL BY PCR (TYPE A & B): H1N1 flu by pcr: NOT DETECTED

## 2012-01-14 MED ORDER — LISINOPRIL 10 MG PO TABS: 10.0000 mg | ORAL_TABLET | Freq: Every day | ORAL | Status: DC

## 2012-01-14 MED ORDER — POTASSIUM CHLORIDE CRYS ER 20 MEQ PO TBCR
40.0000 meq | EXTENDED_RELEASE_TABLET | Freq: Two times a day (BID) | ORAL | Status: AC
  Administered 2012-01-14 (×2): 40 meq via ORAL
  Filled 2012-01-14 (×2): qty 2

## 2012-01-14 NOTE — Progress Notes (Addendum)
[facility] PROGRESS NOTE  Assessment/Plan: UTI (urinary tract infection) (01/13/2012) - rocephin 11.17.2013 - UC & BC: gram negative rods sensitives pending. - Mild temp, defervecing  Nausea and vomiting (01/13/2012) - resolved 2/2 to above. - zofran PRN.  Acute renal failure (01/13/2012) - agree with holding ACE. - cont NS, Cr. Trending down.  Hypokalemia: -replete 2/2 to decrease intravascular volume and vomiting.  Viral upper respiratory infection (01/13/2012) - Influenza PCR pending.  Hyperglycemia: - HBgA1c 5.5 - start SSI.  Human immunodeficiency virus (HIV) disease (03/18/2009) - continue Truvada.  SCHIZOPHRENIA (03/18/2009) -continue home meds.  DRUG ABUSE (06/30/2009) -counseling  HYPERTENSION (03/19/2009) -hold Bp meds. -came in AKI, cr. Improving.     Code Status: full Family Communication: none  Disposition Plan: home in 1-2 days.   Consultants:  none  Procedures:  none  Antibiotics:  rocephin (indicate start date, and stop date if known)  HPI/Subjective: No complains  Objective: Filed Vitals:   01/13/12 1804 01/13/12 2015 01/13/12 2205 01/14/12 0505  BP: 121/73 142/83 139/109 120/68  Pulse: 91 83 119 66  Temp: 99.9 F (37.7 C) 98.8 F (37.1 C) 99.9 F (37.7 C) 98.3 F (36.8 C)  TempSrc:  Oral Oral Oral  Resp: 20 14 18 15   Height:   4\' 10"  (1.473 m)   Weight:   57.97 kg (127 lb 12.8 oz)   SpO2:  100% 100% 100%    Intake/Output Summary (Last 24 hours) at 01/14/12 0656 Last data filed at 01/14/12 0505  Gross per 24 hour  Intake   1450 ml  Output    200 ml  Net   1250 ml   Filed Weights   01/13/12 2205  Weight: 57.97 kg (127 lb 12.8 oz)    Exam:  General: Alert, awake, oriented x2, sleepy HEENT: No bruits, no goiter.  Heart: Regular rate and rhythm, without murmurs, rubs, gallops.  Lungs: Good air movement, crackle on right.  Abdomen: Soft, nontender, nondistended, positive bowel sounds.     Data  Reviewed: Basic Metabolic Panel:  Lab 01/14/12 1610 01/13/12 1502  NA 138 135  K 3.0* 3.5  CL 105 98  CO2 22 22  GLUCOSE 138* 107*  BUN 11 17  CREATININE 1.21* 1.54*  CALCIUM 8.7 10.1  MG -- --  PHOS -- --   Liver Function Tests:  Lab 01/13/12 1502  AST 62*  ALT 37*  ALKPHOS 85  BILITOT 0.6  PROT 9.6*  ALBUMIN 4.3   No results found for this basename: LIPASE:5,AMYLASE:5 in the last 168 hours No results found for this basename: AMMONIA:5 in the last 168 hours CBC:  Lab 01/14/12 0428 01/13/12 1502  WBC 6.9 7.0  NEUTROABS -- 5.7  HGB 12.2 14.2  HCT 34.8* 40.1  MCV 89.2 87.9  PLT 105* 127*   Cardiac Enzymes: No results found for this basename: CKTOTAL:5,CKMB:5,CKMBINDEX:5,TROPONINI:5 in the last 168 hours BNP (last 3 results) No results found for this basename: PROBNP:3 in the last 8760 hours CBG:  Lab 01/13/12 2259  GLUCAP 99    No results found for this or any previous visit (from the past 240 hour(s)).   Studies: Dg Chest 2 View  01/13/2012  *RADIOLOGY REPORT*  Clinical Data: Cough, congestion, fever  CHEST - 2 VIEW  Comparison: 03/24/2011  Findings: Lungs are essentially clear.  No focal consolidation. No pleural effusion or pneumothorax.  Cardiomediastinal silhouette is within normal limits.  Visualized osseous structures are within normal limits.  Cholecystectomy clips.  IMPRESSION: No evidence of  acute cardiopulmonary disease.   Original Report Authenticated By: Charline Bills, M.D.     Scheduled Meds:    . [COMPLETED] acetaminophen  650 mg Oral Once  . cefTRIAXone (ROCEPHIN)  IV  1 g Intravenous QHS  . cloNIDine  0.2 mg Oral Daily  . docusate sodium  100 mg Oral BID  . Dolutegravir Sodium  50 mg Oral Daily  . emtricitabine-tenofovir  1 tablet Oral Daily  . enoxaparin (LOVENOX) injection  40 mg Subcutaneous QHS  . insulin aspart  0-9 Units Subcutaneous TID WC  . nicotine  14 mg Transdermal Daily  . [COMPLETED] ondansetron  4 mg Intravenous Once    . polyethylene glycol  17 g Oral BID  . senna  1 tablet Oral BID  . [COMPLETED] sodium chloride  1,000 mL Intravenous Once  . trazodone  300 mg Oral QHS  . [DISCONTINUED] cefTRIAXone (ROCEPHIN)  IV  1 g Intravenous Once  . [DISCONTINUED] sodium chloride  1,000 mL Intravenous Once   Continuous Infusions:    . sodium chloride 1,000 mL (01/13/12 2317)     Marinda Elk  [facility] Pager [phone].  If 8PM-8AM, please contact night-coverage at [email or web address], password [facility] 01/14/2012, 6:56 AM  LOS: 1 day

## 2012-01-14 NOTE — Progress Notes (Signed)
UR Completed.  Jeane Cashatt Jane [phone] 01/14/2012  

## 2012-01-15 LAB — GLUCOSE, CAPILLARY
Glucose-Capillary: 114 mg/dL — ABNORMAL HIGH (ref 70–99)
Glucose-Capillary: 115 mg/dL — ABNORMAL HIGH (ref 70–99)

## 2012-01-15 LAB — URINE CULTURE

## 2012-01-15 LAB — CLOSTRIDIUM DIFFICILE BY PCR: Toxigenic C. Difficile by PCR: NEGATIVE

## 2012-01-15 MED ORDER — BOOST PLUS PO LIQD
237.0000 mL | Freq: Three times a day (TID) | ORAL | Status: DC
  Administered 2012-01-15 – 2012-01-16 (×2): 237 mL via ORAL
  Filled 2012-01-15 (×4): qty 237

## 2012-01-15 NOTE — Progress Notes (Signed)
Clinical Social Worker received referral that pt needed assistance with obtaining medications. Inappropriate Clinical Social Worker referral. Clinical Social Worker notified RNCM of medication assistance needs. No further social work needs identified at this time. Clinical Social Worker signing off. Please re-consult if any social work needs arise.   Jacklynn Lewis, MSW, LCSWA  Clinical Social Work [phone]

## 2012-01-15 NOTE — Progress Notes (Signed)
INITIAL ADULT NUTRITION ASSESSMENT Date: 01/15/2012   Time: 11:25 AM Reason for Assessment: Nutrition risk   INTERVENTION: Boost Plus TID. Encouraged increased meal intake. Will monitor.   Pt meets criteria for severe malnutrition of acute illness AEB <50% estimated energy intake with 7.3% weight loss in the past 2 weeks.   ASSESSMENT: Female 46 y.o.  Dx: UTI (urinary tract infection)  Food/Nutrition Related Hx: Pt with HIV. Pt reports intake <50% of usual amount since Monday PTA r/t nausea/vomiting and decreased appetite. Pt states before then she was eating 3 meals/day and drinking Boost off/on. Pt reports 18 pound unintended weight loss in the past 2 weeks. Past records show pt with only 10 pound unintended weight loss in the past 2 weeks but a decrease of 35 pounds in the past 11 months. Pt did not eat any breakfast today. Pt reports improvement in nausea and denies any vomiting today.   Hx:  Past Medical History  Diagnosis Date  . HIV (human immunodeficiency virus infection)   . Hepatitis C   . Diabetes mellitus   . Anxiety   . Pinched nerve   . Scoliosis   . Hernia   . Ankle fracture   . Hypertension    Related Meds:  Scheduled Meds:   . cefTRIAXone (ROCEPHIN)  IV  1 g Intravenous QHS  . cloNIDine  0.2 mg Oral Daily  . docusate sodium  100 mg Oral BID  . Dolutegravir Sodium  50 mg Oral Daily  . emtricitabine-tenofovir  1 tablet Oral Daily  . enoxaparin (LOVENOX) injection  40 mg Subcutaneous QHS  . insulin aspart  0-9 Units Subcutaneous TID WC  . nicotine  14 mg Transdermal Daily  . polyethylene glycol  17 g Oral BID  . [COMPLETED] potassium chloride  40 mEq Oral BID  . senna  1 tablet Oral BID  . trazodone  300 mg Oral QHS   Continuous Infusions:   . sodium chloride 10 mL (01/14/12 1649)   PRN Meds:.acetaminophen, acetaminophen, albuterol, bisacodyl, cyclobenzaprine, ondansetron (ZOFRAN) IV, ondansetron, oxyCODONE-acetaminophen, promethazine, traMADol  Ht: 4'  10" (147.3 cm)  Wt: 127 lb 12.8 oz (57.97 kg)  Ideal Wt: 97 lb % Ideal Wt: 131  Usual Wt: 145 lb % Usual Wt: 87  Wt Readings from Last 5 Encounters:  01/13/12 127 lb 12.8 oz (57.97 kg)  01/02/12 137 lb (62.143 kg)  12/19/11 140 lb (63.504 kg)  03/24/11 162 lb (73.483 kg)  10/01/10 155 lb (70.308 kg)     Body mass index is 26.71 kg/(m^2).   Labs:  CMP     Component Value Date/Time   NA 138 01/14/2012 0428   K 3.0* 01/14/2012 0428   CL 105 01/14/2012 0428   CO2 22 01/14/2012 0428   GLUCOSE 138* 01/14/2012 0428   BUN 11 01/14/2012 0428   CREATININE 1.21* 01/14/2012 0428   CREATININE 0.89 12/19/2011 0910   CALCIUM 8.7 01/14/2012 0428   PROT 9.6* 01/13/2012 1502   ALBUMIN 4.3 01/13/2012 1502   AST 62* 01/13/2012 1502   ALT 37* 01/13/2012 1502   ALKPHOS 85 01/13/2012 1502   BILITOT 0.6 01/13/2012 1502   GFRNONAA 52* 01/14/2012 0428   GFRAA 60* 01/14/2012 0428    Intake/Output Summary (Last 24 hours) at 01/15/12 1131 Last data filed at 01/15/12 0530  Gross per 24 hour  Intake   1280 ml  Output    600 ml  Net    680 ml   Last BM - 11/17  Diet  Order: General   IVF:    sodium chloride Last Rate: 10 mL (01/14/12 1649)    Estimated Nutritional Needs:   Kcal:1750-2050 Protein:105-115g Fluid:1.7-2L  NUTRITION DIAGNOSIS: -Inadequate oral intake (NI-2.1).  Status: Ongoing  RELATED TO: decreased appetite  AS EVIDENCE BY: pt statement  MONITORING/EVALUATION(Goals): Pt to consume >90% of meals/supplements.   EDUCATION NEEDS: -Education needs addressed - provided education on high calorie/protein nutrition therapy and carbohydrate counting. Teach back method used. Provided pt with handouts of this information.    Dietitian #: [phone]  DOCUMENTATION CODES Per approved criteria  -Severe malnutrition in the context of acute illness or injury    Marshall Cork 01/15/2012, 11:25 AM

## 2012-01-16 DIAGNOSIS — A498 Other bacterial infections of unspecified site: Secondary | ICD-10-CM

## 2012-01-16 DIAGNOSIS — N12 Tubulo-interstitial nephritis, not specified as acute or chronic: Principal | ICD-10-CM

## 2012-01-16 DIAGNOSIS — R7881 Bacteremia: Secondary | ICD-10-CM | POA: Diagnosis present

## 2012-01-16 LAB — GLUCOSE, CAPILLARY: Glucose-Capillary: 115 mg/dL — ABNORMAL HIGH (ref 70–99)

## 2012-01-16 LAB — CULTURE, BLOOD (ROUTINE X 2)

## 2012-01-16 MED ORDER — OXYCODONE-ACETAMINOPHEN 5-325 MG PO TABS: 1.0000 | ORAL_TABLET | Freq: Three times a day (TID) | ORAL | Status: DC | PRN

## 2012-01-16 MED ORDER — CIPROFLOXACIN HCL 500 MG PO TABS
500.0000 mg | ORAL_TABLET | Freq: Two times a day (BID) | ORAL | Status: DC
  Administered 2012-01-16: 500 mg via ORAL
  Filled 2012-01-16 (×3): qty 1

## 2012-01-16 MED ORDER — CIPROFLOXACIN HCL 500 MG PO TABS: 500.0000 mg | ORAL_TABLET | Freq: Two times a day (BID) | ORAL | Status: DC

## 2012-01-16 NOTE — Discharge Summary (Addendum)
Physician Discharge Summary  Ashley [name]:[id number] DOB: 20-Aug-1965 DOA: 01/13/2012  PCP: Dorrene German, MD  Admit date: 01/13/2012 Discharge date: 01/16/2012  Time spent: 30 minutes  Recommendations for Outpatient Follow-up:  1. PCP in 2 weeks check BP  Discharge Diagnoses:  Principal Problem:  *Pyelonephritis Active Problems:  Viral upper respiratory infection  Type II or unspecified type diabetes mellitus without mention of complication, not stated as uncontrolled  Human immunodeficiency virus (HIV) disease  HEPATITIS C  SCHIZOPHRENIA  BIPOLAR AFFECTIVE DISORDER, DEPRESSED, SEVERE  DRUG ABUSE  HYPERTENSION  Fever  UTI (urinary tract infection)  Nausea and vomiting  Acute renal failure  Hypokalemia  Bacteremia due to Escherichia coli   Discharge Condition: stable  Diet recommendation: Carb modified diet  Filed Weights   01/13/12 2205 01/16/12 0517  Weight: 57.97 kg (127 lb 12.8 oz) 58.333 kg (128 lb 9.6 oz)    History of present illness:  46 y.o. African American female with history of HIV last CD4 count on 12/19/2011 was 219, hepatitis C, diabetes appears to be diet controlled, anxiety, and hypertension who presents with the above complaints. Patient reported that she started taking her HIV medications on Monday, 01/08/2012. She developed nausea and vomiting since 01/08/2012. On 01/10/2012 she started having sinus congestion, sore throat and fever. She said decreasing appetite over the last week as a result she presented to the meds department for further evaluation. Also since 01/11/2012 she noticed dysuria with urination. In the emergency department patient had a fever of 103.1. The hospitalist service was asked to admit the patient for further care and management. Currently denies any shortness of breath. Does complain of chest pain with cough. Cough is productive for yellow sputum. She reports feeling constipated and not having a bowel movement for the last 3  days. Denies any abdominal pain. Denies any headaches or vision changes.   Hospital Course:  Pyelonephritis/Bactermia Due to E. Coli: - rocephin 11.16.2013, changed to cipro 11.19.2013 will take for a total of 14 days. - UC & BC: E. Coli sensitive to cipro. - defeverse in 48 hours..  Nausea and vomiting (01/13/2012) - resolved 2/2 to above.  - treated with zofran PRN.   Acute renal failure (01/13/2012) - agree with holding ACE.  - started on NS. Resume ACE as an outpatient  Hypokalemia: -replete 2/2 to decrease intravascular volume and vomiting.   Viral upper respiratory infection (01/13/2012) - Influenza PCR negative  Hyperglycemia: - HBgA1c 5.5  - start SSI.   Human immunodeficiency virus (HIV) disease (03/18/2009) - continue Truvada.   SCHIZOPHRENIA (03/18/2009) -continue home meds.   DRUG ABUSE (06/30/2009) -counseling   HYPERTENSION (03/19/2009) -hold Bp meds. On admission - cr. Improved resume as an outpatient.   Procedures:  none (i.e. Studies not automatically included, echos, thoracentesis, etc; not x-rays)  Consultations:  none  Discharge Exam: Filed Vitals:   01/15/12 2122 01/15/12 2125 01/16/12 0517 01/16/12 0545  BP:  145/88 165/105 165/88  Pulse:  70 69   Temp: 99.7 F (37.6 C) 99.7 F (37.6 C) 98 F (36.7 C)   TempSrc: Oral Oral Oral   Resp:  20 20   Height:      Weight:   58.333 kg (128 lb 9.6 oz)   SpO2:  99% 97%     General: A&Ox3  Cardiovascular: RRR Respiratory: good air movement CTA B/L  Discharge Instructions      Discharge Orders    Future Orders Please Complete By Expires   Diet - low  sodium heart healthy      Increase activity slowly          Medication List     As of 01/16/2012  7:20 AM    TAKE these medications         acetaminophen 500 MG tablet   Commonly known as: TYLENOL   Take 1 tablet (500 mg total) by mouth every 6 (six) hours as needed for pain.      albuterol 108 (90 BASE) MCG/ACT inhaler    Commonly known as: PROVENTIL HFA;VENTOLIN HFA   Inhale 2 puffs into the lungs every 6 (six) hours as needed. Wheezing and shortness of breath      ciprofloxacin 500 MG tablet   Commonly known as: CIPRO   Take 1 tablet (500 mg total) by mouth 2 (two) times daily.      cloNIDine 0.2 MG tablet   Commonly known as: CATAPRES   Take 0.2 mg by mouth daily.      cyclobenzaprine 10 MG tablet   Commonly known as: FLEXERIL   Take 10 mg by mouth 2 (two) times daily as needed. Muscle spasms      Dolutegravir Sodium 50 MG Tabs   Take 50 mg by mouth daily.      emtricitabine-tenofovir 200-300 MG per tablet   Commonly known as: TRUVADA   Take 1 tablet by mouth daily.      ibuprofen 200 MG tablet   Commonly known as: ADVIL,MOTRIN   Take 600-800 mg by mouth every 6 (six) hours as needed. For pain      lisinopril 10 MG tablet   Commonly known as: PRINIVIL,ZESTRIL   Take 10 mg by mouth daily.      oxyCODONE-acetaminophen 5-325 MG per tablet   Commonly known as: PERCOCET/ROXICET   Take 1 tablet by mouth 3 (three) times daily as needed. For pain      promethazine 12.5 MG tablet   Commonly known as: PHENERGAN   Take 12.5 mg by mouth every 6 (six) hours as needed. For nausea      traMADol 50 MG tablet   Commonly known as: ULTRAM   Take 50 mg by mouth every 6 (six) hours as needed. For pain      trazodone 300 MG tablet   Commonly known as: DESYREL   Take 300 mg by mouth at bedtime.        Follow-up Information    Follow up with AVBUERE,EDWIN A, MD. In 2 weeks. (hospital follow up)    Contact information:   [address] [place] [address] [phone]           The results of significant diagnostics from this hospitalization (including imaging, microbiology, ancillary and laboratory) are listed below for reference.    Significant Diagnostic Studies: Dg Chest 2 View  01/13/2012  *RADIOLOGY REPORT*  Clinical Data: Cough, congestion, fever  CHEST - 2 VIEW  Comparison:  03/24/2011  Findings: Lungs are essentially clear.  No focal consolidation. No pleural effusion or pneumothorax.  Cardiomediastinal silhouette is within normal limits.  Visualized osseous structures are within normal limits.  Cholecystectomy clips.  IMPRESSION: No evidence of acute cardiopulmonary disease.   Original Report Authenticated By: Charline Bills, M.D.     Microbiology: Recent Results (from the past 240 hour(s))  CULTURE, BLOOD (ROUTINE X 2)     Status: Normal (Preliminary result)   Collection Time   01/13/12  3:02 PM      Component Value Range Status Comment  Specimen Description BLOOD LEFT [facility]   Final    Special Requests BOTTLES DRAWN AEROBIC AND ANAEROBIC 3 CC EACH   Final    Culture  Setup Time 01/13/2012 21:42   Final    Culture     Final    Value: ESCHERICHIA COLI     Note: Gram Stain Report Called to,Read Back By and Verified With: [facility] SIDDIQI 01/14/12 @ 1:05PM BY RUSCA.   Report Status PENDING   Incomplete   CULTURE, BLOOD (ROUTINE X 2)     Status: Normal (Preliminary result)   Collection Time   01/13/12  4:00 PM      Component Value Range Status Comment   Specimen Description BLOOD RIGHT AC   Final    Special Requests BOTTLES DRAWN AEROBIC AND ANAEROBIC 5 CC EACH   Final    Culture  Setup Time 01/13/2012 21:42   Final    Culture     Final    Value:        BLOOD CULTURE RECEIVED NO GROWTH TO DATE CULTURE WILL BE HELD FOR 5 DAYS BEFORE ISSUING A FINAL NEGATIVE REPORT   Report Status PENDING   Incomplete   URINE CULTURE     Status: Normal   Collection Time   01/13/12  5:08 PM      Component Value Range Status Comment   Specimen Description URINE, CLEAN CATCH   Final    Special Requests NONE   Final    Culture  Setup Time 01/13/2012 21:57   Final    Colony Count >=100,000 COLONIES/ML   Final    Culture ESCHERICHIA COLI   Final    Report Status 01/15/2012 FINAL   Final    Organism ID, Bacteria ESCHERICHIA COLI   Final   CLOSTRIDIUM DIFFICILE BY PCR      Status: Normal   Collection Time   01/15/12  1:54 AM      Component Value Range Status Comment   C difficile by pcr NEGATIVE  NEGATIVE Final      Labs: Basic Metabolic Panel:  Lab 01/14/12 1610 01/13/12 1502  NA 138 135  K 3.0* 3.5  CL 105 98  CO2 22 22  GLUCOSE 138* 107*  BUN 11 17  CREATININE 1.21* 1.54*  CALCIUM 8.7 10.1  MG -- --  PHOS -- --   Liver Function Tests:  Lab 01/13/12 1502  AST 62*  ALT 37*  ALKPHOS 85  BILITOT 0.6  PROT 9.6*  ALBUMIN 4.3   No results found for this basename: LIPASE:5,AMYLASE:5 in the last 168 hours No results found for this basename: AMMONIA:5 in the last 168 hours CBC:  Lab 01/14/12 0428 01/13/12 1502  WBC 6.9 7.0  NEUTROABS -- 5.7  HGB 12.2 14.2  HCT 34.8* 40.1  MCV 89.2 87.9  PLT 105* 127*   Cardiac Enzymes: No results found for this basename: CKTOTAL:5,CKMB:5,CKMBINDEX:5,TROPONINI:5 in the last 168 hours BNP: BNP (last 3 results) No results found for this basename: PROBNP:3 in the last 8760 hours CBG:  Lab 01/15/12 2122 01/15/12 1646 01/15/12 1148 01/15/12 0844 01/14/12 2123  GLUCAP 129* 115* 114* 88 121*       Signed:  FELIZ ORTIZ, ABRAHAM  [facility] 01/16/2012, 7:20 AM

## 2012-01-16 NOTE — Clinical Social Work Psychosocial (Signed)
     Clinical Social Work Department BRIEF PSYCHOSOCIAL ASSESSMENT 01/16/2012  Patient:  Ashley [name], Ashley [name]     Account Number:  [id number]     Admit date:  01/13/2012  Clinical Social Worker:  Jacelyn Grip  Date/Time:  01/16/2012 09:35 AM  Referred by:  RN  Date Referred:  01/16/2012 Referred for  Other - See comment   Other Referral:   other psychosocial needs   Interview type:  Patient Other interview type:    PSYCHOSOCIAL DATA Living Status:  FAMILY Admitted from facility:   Level of care:   Primary support name:  Britta Mccreedy Godsey/mother Primary support relationship to patient:  PARENT Degree of support available:   unknown at this time    CURRENT CONCERNS Current Concerns  Other - See comment   Other Concerns:   psychosocial needs    SOCIAL WORK ASSESSMENT / PLAN CSW was reconsulted by RN stating that pt requesting to speak with CSW and RNCM. CSW and RNCM met with pt at bedside. RNCM assisted with clarifying pt medications questions. CSW assessed for further needs. Pt reports that she currently lives with pt aunt and is interested in finding some place to assist her with finding her own housing. Pt is awaiting her disablity to be approved and is then hopeful to be able to move out of her aunts home. CSW discussed [employer] if pt is in emergent need of housing, but pt stated that she can and plans to return to pt aunts home at discharge. CSW discussed [employer] and provided pt contact information for [employer] and [email or web address] internet website that is a resource to explore affordable house. Pt expressed appreciation and plans to follow up with resources after discharge. Pt reports need for bus pass at discharge as pt does not have transportation. CSW provided bus pass. Per RN, pt for discharge today. No further social work needs identified. CSW signed off.   Assessment/plan status:  No Further Intervention  Required Other assessment/ plan:   Information/referral to community resources:   Information about [employer], [employer], and [email or web address]    PATIENTS/FAMILYS RESPONSE TO PLAN OF CARE: Pt alert and oriented and pleasant. Pt reports support from pt family, but is hopeful to gain more independence once she receives response about disability. Pt plans to use resources in order to search for her own housing.

## 2012-01-19 LAB — CULTURE, BLOOD (ROUTINE X 2): Culture: NO GROWTH

## 2012-03-04 ENCOUNTER — Other Ambulatory Visit: Payer: Self-pay

## 2012-03-19 ENCOUNTER — Ambulatory Visit: Payer: Self-pay | Admitting: Internal Medicine

## 2012-03-19 ENCOUNTER — Telehealth: Payer: Self-pay | Admitting: *Deleted

## 2012-03-19 NOTE — Telephone Encounter (Signed)
Called patient and left voice mail to call the clinic to reschedule her lab and MD visit, she no showed both appts. Wendall Mola CMA

## 2012-03-21 ENCOUNTER — Encounter (HOSPITAL_COMMUNITY): Payer: Self-pay | Admitting: *Deleted

## 2012-03-21 ENCOUNTER — Other Ambulatory Visit: Payer: Self-pay

## 2012-03-21 ENCOUNTER — Emergency Department (HOSPITAL_COMMUNITY)
Admission: EM | Admit: 2012-03-21 | Discharge: 2012-03-21 | Disposition: A | Discharge status: Home / Self Care | Payer: Medicaid Other | Attending: Emergency Medicine | Admitting: Emergency Medicine

## 2012-03-21 DIAGNOSIS — Z21 Asymptomatic human immunodeficiency virus [HIV] infection status: Secondary | ICD-10-CM | POA: Insufficient documentation

## 2012-03-21 DIAGNOSIS — Z8781 Personal history of (healed) traumatic fracture: Secondary | ICD-10-CM | POA: Insufficient documentation

## 2012-03-21 DIAGNOSIS — Z79899 Other long term (current) drug therapy: Secondary | ICD-10-CM | POA: Insufficient documentation

## 2012-03-21 DIAGNOSIS — Z8619 Personal history of other infectious and parasitic diseases: Secondary | ICD-10-CM | POA: Insufficient documentation

## 2012-03-21 DIAGNOSIS — E119 Type 2 diabetes mellitus without complications: Secondary | ICD-10-CM | POA: Insufficient documentation

## 2012-03-21 DIAGNOSIS — F411 Generalized anxiety disorder: Secondary | ICD-10-CM | POA: Insufficient documentation

## 2012-03-21 DIAGNOSIS — Z8719 Personal history of other diseases of the digestive system: Secondary | ICD-10-CM | POA: Insufficient documentation

## 2012-03-21 DIAGNOSIS — K089 Disorder of teeth and supporting structures, unspecified: Secondary | ICD-10-CM | POA: Insufficient documentation

## 2012-03-21 DIAGNOSIS — B20 Human immunodeficiency virus [HIV] disease: Secondary | ICD-10-CM

## 2012-03-21 DIAGNOSIS — F17200 Nicotine dependence, unspecified, uncomplicated: Secondary | ICD-10-CM | POA: Insufficient documentation

## 2012-03-21 DIAGNOSIS — K0889 Other specified disorders of teeth and supporting structures: Secondary | ICD-10-CM

## 2012-03-21 DIAGNOSIS — Z8739 Personal history of other diseases of the musculoskeletal system and connective tissue: Secondary | ICD-10-CM | POA: Insufficient documentation

## 2012-03-21 DIAGNOSIS — I10 Essential (primary) hypertension: Secondary | ICD-10-CM | POA: Insufficient documentation

## 2012-03-21 LAB — COMPLETE METABOLIC PANEL WITH GFR
ALT: 46 U/L — ABNORMAL HIGH (ref 0–35)
AST: 62 U/L — ABNORMAL HIGH (ref 0–37)
Albumin: 4.5 g/dL (ref 3.5–5.2)
Calcium: 9.6 mg/dL (ref 8.4–10.5)
Chloride: 102 mEq/L (ref 96–112)
Potassium: 4 mEq/L (ref 3.5–5.3)
Sodium: 140 mEq/L (ref 135–145)
Total Protein: 7.6 g/dL (ref 6.0–8.3)

## 2012-03-21 MED ORDER — HYDROCODONE-ACETAMINOPHEN 5-325 MG PO TABS: 1.0000 | ORAL_TABLET | Freq: Four times a day (QID) | ORAL | Status: DC | PRN

## 2012-03-21 MED ORDER — ESOMEPRAZOLE MAGNESIUM 40 MG PO CPDR: 40.0000 mg | DELAYED_RELEASE_CAPSULE | Freq: Every day | ORAL | Status: DC

## 2012-03-21 MED ORDER — PENICILLIN V POTASSIUM 500 MG PO TABS: 500.0000 mg | ORAL_TABLET | Freq: Three times a day (TID) | ORAL | Status: DC

## 2012-03-21 NOTE — ED Provider Notes (Signed)
History   This chart was scribed for non-physician practitioner working with Richardean Canal, MD by Gerlean Ren, ED Scribe. This patient was seen in room TR04C/TR04C and the patient's care was started at 4:11 PM.    CSN: [id number]  Arrival date & time 03/21/12  1236   First MD Initiated Contact with Patient 03/21/12 1543      Chief Complaint  Patient presents with  . Dental Pain     The history is provided by the patient. No language interpreter was used.   Ashley [name] is a 47 y.o. female with h/o HIV, hepatitis C, DM, and HTN who presents to the Emergency Department complaining of constant, sharp, gradually worsening left lower dental pain rated as 10/10 that that radiates into jaw worsened when eating since the tooth's filling fell out in November.  No associated fever.  No known allergies.  Pt is a current everyday smoker but denies alcohol use.  Past Medical History  Diagnosis Date  . HIV (human immunodeficiency virus infection)   . Hepatitis C   . Diabetes mellitus   . Anxiety   . Pinched nerve   . Scoliosis   . Hernia   . Ankle fracture   . Hypertension     Past Surgical History  Procedure Date  . Cholecystectomy   . Cesarean section   . Hernia repair   . Liver biopsy     Family History  Problem Relation Age of Onset  . Diabetes Mother   . Hypertension Mother     History  Substance Use Topics  . Smoking status: Current Every Day Smoker -- 0.5 packs/day    Types: Cigarettes  . Smokeless tobacco: Never Used  . Alcohol Use: No    OB History    Grav Para Term Preterm Abortions TAB SAB Ect Mult Living   2 2        2       Review of Systems  Constitutional: Negative for fever.  HENT: Positive for dental problem.     Allergies  Review of patient's allergies indicates no known allergies.  Home Medications   Current Outpatient Rx  Name  Route  Sig  Dispense  Refill  . ACETAMINOPHEN 500 MG PO TABS   Oral   Take 1 tablet (500 mg total) by mouth every  6 (six) hours as needed for pain.   30 tablet   0   . ALBUTEROL SULFATE HFA 108 (90 BASE) MCG/ACT IN AERS   Inhalation   Inhale 2 puffs into the lungs every 6 (six) hours as needed. Wheezing and shortness of breath         . AMLODIPINE BESYLATE 10 MG PO TABS   Oral   Take 10 mg by mouth daily.         Marland Kitchen CETIRIZINE HCL 10 MG PO TABS   Oral   Take 10 mg by mouth daily.         Marland Kitchen CIPROFLOXACIN HCL 500 MG PO TABS   Oral   Take 1 tablet (500 mg total) by mouth 2 (two) times daily.   22 tablet   0   . CLONIDINE HCL 0.2 MG PO TABS   Oral   Take 0.2 mg by mouth daily.         . DOLUTEGRAVIR SODIUM 50 MG PO TABS   Oral   Take 50 mg by mouth daily.   30 tablet   5   . EMTRICITABINE-TENOFOVIR 200-300 MG PO  TABS   Oral   Take 1 tablet by mouth daily.   30 tablet   5   . FLUOXETINE HCL 40 MG PO CAPS   Oral   Take 40 mg by mouth daily.         Marland Kitchen HYDROCODONE-ACETAMINOPHEN 7.5-325 MG PO TABS   Oral   Take 1 tablet by mouth every 4 (four) hours as needed. For pain         . HYDROXYZINE HCL 25 MG PO TABS   Oral   Take 25 mg by mouth 2 (two) times daily as needed. For itching         . IBUPROFEN 200 MG PO TABS   Oral   Take 600-800 mg by mouth every 6 (six) hours as needed. For pain         . LISINOPRIL 10 MG PO TABS   Oral   Take 40 mg by mouth daily.          . OXYCODONE-ACETAMINOPHEN 5-325 MG PO TABS   Oral   Take 1 tablet by mouth 3 (three) times daily as needed. For pain   10 tablet   0   . QUETIAPINE FUMARATE 300 MG PO TABS   Oral   Take 300 mg by mouth at bedtime.         . TRAMADOL HCL 50 MG PO TABS   Oral   Take 50 mg by mouth every 6 (six) hours as needed. For pain         . TRAZODONE HCL 300 MG PO TABS   Oral   Take 300 mg by mouth at bedtime.         Marland Kitchen VITAMIN C 500 MG PO TABS   Oral   Take 500 mg by mouth daily.           BP 157/88  Pulse 69  Temp 98.1 F (36.7 C) (Oral)  Resp 16  SpO2 100%  Physical Exam    Nursing note and vitals reviewed. Constitutional: She is oriented to person, place, and time. She appears well-developed and well-nourished. No distress.  HENT:  Head: Normocephalic.  Mouth/Throat:    Eyes: EOM are normal.  Neck: Normal range of motion.  Pulmonary/Chest: Effort normal. No respiratory distress.  Musculoskeletal: Normal range of motion.  Neurological: She is alert and oriented to person, place, and time.  Skin: Skin is warm.  Psychiatric: She has a normal mood and affect. Her behavior is normal.    ED Course  Procedures (including critical care time) DIAGNOSTIC STUDIES: Oxygen Saturation is 100% on room air, normal by my interpretation.    COORDINATION OF CARE: 4:13 PM- Patient informed of clinical course, understands medical decision-making process, and agrees with plan.  Labs Reviewed - No data to display No results found.   1. Pain, dental       MDM  Uncomplicated dental pain.  I personally performed the services described in this documentation, which was scribed in my presence. The recorded information has been reviewed and is accurate.         Roxy Horseman, PA-C 03/22/12 0030

## 2012-03-21 NOTE — ED Notes (Signed)
No answer x 2 for triage.

## 2012-03-21 NOTE — ED Notes (Signed)
Pt with dental pain since October when the filling in her L lower molar fell out.  She came today b/c the pain is worse.

## 2012-03-22 LAB — T-HELPER CELL (CD4) - (RCID CLINIC ONLY): CD4 % Helper T Cell: 32 % — ABNORMAL LOW (ref 33–55)

## 2012-03-22 LAB — HIV-1 RNA ULTRAQUANT REFLEX TO GENTYP+: HIV-1 RNA Quant, Log: 1.62 {Log} — ABNORMAL HIGH (ref <1.30)

## 2012-03-22 LAB — CBC WITH DIFFERENTIAL/PLATELET
Basophils Absolute: 0 10*3/uL (ref 0.0–0.1)
Lymphocytes Relative: 52 % — ABNORMAL HIGH (ref 12–46)
Neutro Abs: 0.8 10*3/uL — ABNORMAL LOW (ref 1.7–7.7)
Platelets: 160 10*3/uL (ref 150–400)
RDW: 16.2 % — ABNORMAL HIGH (ref 11.5–15.5)
WBC: 2.5 10*3/uL — ABNORMAL LOW (ref 4.0–10.5)

## 2012-03-23 NOTE — ED Provider Notes (Signed)
Medical screening examination/treatment/procedure(s) were performed by non-physician practitioner and as supervising physician I was immediately available for consultation/collaboration.   Jahron Hunsinger H Luz Mares, MD 03/23/12 0856 

## 2012-05-10 ENCOUNTER — Other Ambulatory Visit: Payer: Self-pay | Admitting: Internal Medicine

## 2012-05-10 DIAGNOSIS — Z1231 Encounter for screening mammogram for malignant neoplasm of breast: Secondary | ICD-10-CM

## 2012-06-17 ENCOUNTER — Ambulatory Visit: Payer: Medicaid Other

## 2012-07-02 ENCOUNTER — Telehealth: Payer: Self-pay | Admitting: *Deleted

## 2012-07-02 ENCOUNTER — Ambulatory Visit: Payer: Medicaid Other | Admitting: Internal Medicine

## 2012-07-02 NOTE — Telephone Encounter (Signed)
Called patient to remind her of her appointment today. Patient is currently at her PCP's office.  Will be able to come this afternoon for the walk-in clinic from 3-4 with Dr. Drue Second. Andree Coss, RN

## 2012-07-30 ENCOUNTER — Ambulatory Visit: Payer: Medicaid Other | Admitting: Internal Medicine

## 2012-07-30 ENCOUNTER — Other Ambulatory Visit: Payer: Self-pay | Admitting: Licensed Clinical Social Worker

## 2012-07-30 DIAGNOSIS — B20 Human immunodeficiency virus [HIV] disease: Secondary | ICD-10-CM

## 2012-07-30 MED ORDER — EMTRICITABINE-TENOFOVIR DF 200-300 MG PO TABS: 1.0000 | ORAL_TABLET | Freq: Every day | ORAL | Status: DC

## 2012-07-30 MED ORDER — DOLUTEGRAVIR SODIUM 50 MG PO TABS: 50.0000 mg | ORAL_TABLET | Freq: Every day | ORAL | Status: DC

## 2012-08-01 ENCOUNTER — Ambulatory Visit: Payer: Medicaid Other | Admitting: Internal Medicine

## 2012-08-01 ENCOUNTER — Telehealth: Payer: Self-pay | Admitting: *Deleted

## 2012-08-01 NOTE — Telephone Encounter (Signed)
FYI

## 2012-08-01 NOTE — Telephone Encounter (Signed)
Pt again no-showed.  Should not be scheduled for any further appointments, only have walk-in available. Andree Coss, RN

## 2012-08-21 ENCOUNTER — Other Ambulatory Visit: Payer: Medicaid Other

## 2012-08-21 ENCOUNTER — Other Ambulatory Visit: Payer: Self-pay | Admitting: *Deleted

## 2012-08-21 DIAGNOSIS — B20 Human immunodeficiency virus [HIV] disease: Secondary | ICD-10-CM

## 2012-08-26 ENCOUNTER — Telehealth: Payer: Self-pay | Admitting: *Deleted

## 2012-08-26 ENCOUNTER — Ambulatory Visit: Payer: Medicaid Other

## 2012-08-26 NOTE — Telephone Encounter (Signed)
RN spoke with the pt.  Her transportation never showed up to bring her.  Pt requested a new appt.  New appt made.  Pt requested that RN call her back and leave appt information on her phone so that she could keep track of it.  RN called back and left both pap smear and Dr. Luciana Axe appt information.

## 2012-09-09 ENCOUNTER — Ambulatory Visit: Payer: Medicaid Other | Admitting: Internal Medicine

## 2012-09-10 ENCOUNTER — Telehealth: Payer: Self-pay | Admitting: *Deleted

## 2012-09-10 NOTE — Telephone Encounter (Signed)
I'll check with Ashley [name], they have worked with her long term in the past. Ashley [name]

## 2012-09-10 NOTE — Telephone Encounter (Signed)
Called patient to tell her about our walk in clinic and her phone is not in service. Tried to call her Mom, her emergency contact, and she does not have another number for her. Wendall Mola

## 2012-09-10 NOTE — Telephone Encounter (Signed)
Should we refer to [facility]? Thanks Asher Muir

## 2012-09-23 ENCOUNTER — Ambulatory Visit: Payer: Medicaid Other

## 2012-09-23 ENCOUNTER — Telehealth: Payer: Self-pay | Admitting: *Deleted

## 2012-09-23 NOTE — Telephone Encounter (Signed)
Pt missed PAP smear appt.  Unable to leave telephone message.

## 2012-10-05 ENCOUNTER — Encounter (HOSPITAL_COMMUNITY): Payer: Self-pay | Admitting: *Deleted

## 2012-10-05 ENCOUNTER — Emergency Department (INDEPENDENT_AMBULATORY_CARE_PROVIDER_SITE_OTHER): Payer: Medicaid Other

## 2012-10-05 ENCOUNTER — Emergency Department (HOSPITAL_COMMUNITY)
Admission: EM | Admit: 2012-10-05 | Discharge: 2012-10-05 | Disposition: A | Discharge status: Home / Self Care | Payer: Medicaid Other | Source: Home / Self Care

## 2012-10-05 DIAGNOSIS — R0781 Pleurodynia: Secondary | ICD-10-CM

## 2012-10-05 DIAGNOSIS — B360 Pityriasis versicolor: Secondary | ICD-10-CM

## 2012-10-05 DIAGNOSIS — R079 Chest pain, unspecified: Secondary | ICD-10-CM

## 2012-10-05 DIAGNOSIS — R918 Other nonspecific abnormal finding of lung field: Secondary | ICD-10-CM

## 2012-10-05 DIAGNOSIS — H00019 Hordeolum externum unspecified eye, unspecified eyelid: Secondary | ICD-10-CM

## 2012-10-05 DIAGNOSIS — R9389 Abnormal findings on diagnostic imaging of other specified body structures: Secondary | ICD-10-CM

## 2012-10-05 HISTORY — DX: Depression, unspecified: F32.A

## 2012-10-05 HISTORY — DX: Major depressive disorder, single episode, unspecified: F32.9

## 2012-10-05 HISTORY — DX: Respiratory tuberculosis unspecified: A15.9

## 2012-10-05 MED ORDER — KETOCONAZOLE 2 % EX CREA: TOPICAL_CREAM | Freq: Every day | CUTANEOUS | Status: DC

## 2012-10-05 MED ORDER — ACETAMINOPHEN 500 MG PO TABS: 1000.0000 mg | ORAL_TABLET | Freq: Three times a day (TID) | ORAL | Status: DC | PRN

## 2012-10-05 NOTE — ED Provider Notes (Signed)
CSN: [id number]     Arrival date & time 10/05/12  1522 History     First MD Initiated Contact with Patient 10/05/12 1635     Chief Complaint  Patient presents with  . Back Pain   (Consider location/radiation/quality/duration/timing/severity/associated sxs/prior Treatment) HPI Comments: 47 year old female presents complaining of pain in her left ribs for about 2 weeks, gradually worsening. She states that she was "playing" and fell on the ground. Since then, she has pain on her left side, shoulder, worse with movement, and somewhat relieved by rest. She states she has taken some medicine for pain but it is not helping anymore. She denies cough, fever, diaphoresis, nausea, shortness of breath, other chest pain, or history of similar injuries.  Additionally, she mentioned she has a light colored skin rash that is itchy for the past month. She's never had this before. Her light colored patches all over her skin. She does and that the getting more sun than usual recently. She has not tried treating this with anything so far.  Finally, she has a painful spot on her eyelid and wants to know she has a stye. This is been there for a few days. She has had styes in the past that felt exactly like this  Patient is a 47 y.o. female presenting with back pain.  Back Pain Associated symptoms: chest pain (left rib pain)   Associated symptoms: no abdominal pain, no dysuria, no fever and no weakness     Past Medical History  Diagnosis Date  . HIV (human immunodeficiency virus infection)   . Hepatitis C   . Diabetes mellitus   . Anxiety   . Pinched nerve   . Scoliosis   . Hernia   . Ankle fracture   . Hypertension   . TB (tuberculosis)   . Depression    Past Surgical History  Procedure Laterality Date  . Cholecystectomy    . Cesarean section    . Hernia repair    . Liver biopsy     Family History  Problem Relation Age of Onset  . Diabetes Mother   . Hypertension Mother    History   Substance Use Topics  . Smoking status: Current Every Day Smoker -- 1.00 packs/day    Types: Cigarettes  . Smokeless tobacco: Never Used  . Alcohol Use: Yes     Comment: occasional   OB History   Grav Para Term Preterm Abortions TAB SAB Ect Mult Living   2 2        2      Review of Systems  Constitutional: Negative for fever and chills.  Eyes: Negative for visual disturbance.  Respiratory: Negative for cough and shortness of breath.   Cardiovascular: Positive for chest pain (left rib pain). Negative for palpitations and leg swelling.  Gastrointestinal: Negative for nausea, vomiting and abdominal pain.  Endocrine: Negative for polydipsia and polyuria.  Genitourinary: Negative for dysuria, urgency and frequency.  Musculoskeletal: Positive for back pain and arthralgias (Left shoulder pain). Negative for myalgias.  Skin: Negative for rash.  Neurological: Negative for dizziness, weakness and light-headedness.    Allergies  Review of patient's allergies indicates no known allergies.  Home Medications   Current Outpatient Rx  Name  Route  Sig  Dispense  Refill  . acetaminophen (TYLENOL) 500 MG tablet   Oral   Take 1 tablet (500 mg total) by mouth every 6 (six) hours as needed for pain.   30 tablet   0   . albuterol (  PROVENTIL HFA;VENTOLIN HFA) 108 (90 BASE) MCG/ACT inhaler   Inhalation   Inhale 2 puffs into the lungs every 6 (six) hours as needed. Wheezing and shortness of breath         . amLODipine (NORVASC) 10 MG tablet   Oral   Take 10 mg by mouth daily.         . cetirizine (ZYRTEC) 10 MG tablet   Oral   Take 10 mg by mouth daily.         . cloNIDine (CATAPRES) 0.2 MG tablet   Oral   Take 0.2 mg by mouth daily.         . dolutegravir (TIVICAY) 50 MG tablet   Oral   Take 1 tablet (50 mg total) by mouth daily.   30 tablet   5   . emtricitabine-tenofovir (TRUVADA) 200-300 MG per tablet   Oral   Take 1 tablet by mouth daily.   30 tablet   5   .  esomeprazole (NEXIUM) 40 MG capsule   Oral   Take 1 capsule (40 mg total) by mouth daily.   30 capsule   0   . FLUoxetine (PROZAC) 40 MG capsule   Oral   Take 40 mg by mouth daily.         Marland Kitchen HYDROcodone-acetaminophen (NORCO) 7.5-325 MG per tablet   Oral   Take 1 tablet by mouth every 4 (four) hours as needed. For pain         . hydrOXYzine (ATARAX/VISTARIL) 25 MG tablet   Oral   Take 25 mg by mouth 2 (two) times daily as needed. For itching         . ibuprofen (ADVIL,MOTRIN) 200 MG tablet   Oral   Take 600-800 mg by mouth every 6 (six) hours as needed. For pain         . lisinopril (PRINIVIL,ZESTRIL) 10 MG tablet   Oral   Take 40 mg by mouth daily.          Marland Kitchen oxyCODONE-acetaminophen (PERCOCET/ROXICET) 5-325 MG per tablet   Oral   Take 1 tablet by mouth 3 (three) times daily as needed. For pain   10 tablet   0   . QUEtiapine (SEROQUEL) 300 MG tablet   Oral   Take 300 mg by mouth at bedtime.         . traMADol (ULTRAM) 50 MG tablet   Oral   Take 50 mg by mouth every 6 (six) hours as needed. For pain         . trazodone (DESYREL) 300 MG tablet   Oral   Take 300 mg by mouth at bedtime.         . vitamin C (ASCORBIC ACID) 500 MG tablet   Oral   Take 500 mg by mouth daily.         Marland Kitchen acetaminophen (TYLENOL) 500 MG tablet   Oral   Take 2 tablets (1,000 mg total) by mouth 3 (three) times daily as needed for pain.   30 tablet   0   . ciprofloxacin (CIPRO) 500 MG tablet   Oral   Take 1 tablet (500 mg total) by mouth 2 (two) times daily.   22 tablet   0   . HYDROcodone-acetaminophen (NORCO/VICODIN) 5-325 MG per tablet   Oral   Take 1 tablet by mouth every 6 (six) hours as needed for pain.   12 tablet   0   . ketoconazole (NIZORAL) 2 % cream  Topical   Apply topically daily.   30 g   0   . penicillin v potassium (VEETID) 500 MG tablet   Oral   Take 1 tablet (500 mg total) by mouth 3 (three) times daily.   30 tablet   0    BP 137/84   Pulse 69  Temp(Src) 98.5 F (36.9 C) (Oral)  Resp 17  SpO2 100% Physical Exam  Nursing note and vitals reviewed. Constitutional: She is oriented to person, place, and time. She appears well-developed and well-nourished. No distress.  Pulmonary/Chest: She exhibits tenderness (diffusely on the left side ) and bony tenderness.  This pt endorses tenderness on the scapula, musculature, ribs, and shoulder.    Musculoskeletal:  This patient complains of severe tenderness to palpation on all of the cervical spine, thoracic spine, left pes planus musculature, left scapula out to the acromion, left clavicle, left deltoid, and left proximal humerus.  Neurological: She is alert and oriented to person, place, and time.  Skin: Skin is warm and dry. No rash noted. She is not diaphoretic.  Psychiatric: She has a normal mood and affect. Judgment normal.    ED Course   Procedures (including critical care time)  Labs Reviewed - No data to display Dg Ribs Unilateral W/chest Left  10/05/2012   *RADIOLOGY REPORT*  Clinical Data: Fall  LEFT RIBS AND CHEST - 3+ VIEW  Comparison: 01/13/2012  Findings: Upper normal heart size.  Patchy opacity at the left lung base.  Irregular radiodensity in the right side of the neck is likely and external object.  No pneumothorax.  No evidence of acute rib fracture.  IMPRESSION: Mild patchy density at the left lung base.  No evidence of rib fracture.   Original Report Authenticated By: Jolaine Click, M.D.   1. Rib pain   2. Abnormal chest x-ray   3. Tinea versicolor   4. Stye, unspecified laterality     MDM  Upon reviewing this patient's prescription drug record, she picked up a new prescription for hydrocodone every 10 days, the most recent prescription will be today. She mentioned that her primary care doctor would not see her. Also, her complaint does not coincide with her normal physical exam. Will treat this conservatively and have her followup with her primary care  physician. many different narcotics prescriptions from different people.  Will not Rx narcotics for this pt   There is a patchy density in the left lung base, however this does not correlate with the patient's clinical picture. She will followup in one week for a repeat x-ray, either here or with her primary care physician  Warm compresses for the stye. Ketoconazole cream for the tinea versicolor   Meds ordered this encounter  Medications  . ketoconazole (NIZORAL) 2 % cream    Sig: Apply topically daily.    Dispense:  30 g    Refill:  0  . acetaminophen (TYLENOL) 500 MG tablet    Sig: Take 2 tablets (1,000 mg total) by mouth 3 (three) times daily as needed for pain.    Dispense:  30 tablet    Refill:  0     Graylon Good, PA-C 10/05/12 1811

## 2012-10-05 NOTE — ED Notes (Signed)
Reports playing around with someone approx 1 wk ago when she tripped and fell onto ground.  Initially had no c/o's, but shortly after started with constant left mid-to-upper back pain wrapping around left shoulder and down into chest.  Pain is worse with coughing, palpation, and when moving LUE behind her.  Has taken Tylenol.  Left upper chest and back tender to palpation.

## 2012-10-07 NOTE — ED Provider Notes (Signed)
Medical screening examination/treatment/procedure(s) were performed by a resident physician or non-physician practitioner and as the supervising physician I was immediately available for consultation/collaboration.  Clementeen Graham, MD   Rodolph Bong, MD 10/07/12 [phone]

## 2013-02-17 ENCOUNTER — Telehealth: Payer: Self-pay | Admitting: *Deleted

## 2013-02-17 NOTE — Telephone Encounter (Signed)
Last VL 81,191, 11/2011.  No visits since that time.  Will refer to [employer].

## 2013-05-09 ENCOUNTER — Telehealth: Payer: Self-pay | Admitting: *Deleted

## 2013-05-09 NOTE — Telephone Encounter (Signed)
Patient requested refills, has not been seen in more than 1 year.  RN contacted pharmacy, got updated contact information (corrected in our database).  Spoke with patient, she wants to come back in to care.  [facility] first available appointment is in mid April, gave 1st available with clinic physician Dr. Megan Salon 3/16 1:45.  Patient agreed, stated she would get a ride. Landis Gandy, RN

## 2013-05-12 ENCOUNTER — Ambulatory Visit: Payer: Medicaid Other | Admitting: Internal Medicine

## 2013-05-12 ENCOUNTER — Telehealth: Payer: Self-pay | Admitting: *Deleted

## 2013-05-12 NOTE — Telephone Encounter (Signed)
voice mail not set up on phone

## 2013-06-18 ENCOUNTER — Encounter: Payer: Self-pay | Admitting: Internal Medicine

## 2013-06-18 ENCOUNTER — Ambulatory Visit (INDEPENDENT_AMBULATORY_CARE_PROVIDER_SITE_OTHER): Payer: Medicaid Other | Admitting: Internal Medicine

## 2013-06-18 ENCOUNTER — Other Ambulatory Visit (HOSPITAL_COMMUNITY)
Admission: RE | Admit: 2013-06-18 | Discharge: 2013-06-18 | Disposition: A | Discharge status: Home / Self Care | Payer: Medicaid Other | Source: Ambulatory Visit | Attending: Internal Medicine | Admitting: Internal Medicine

## 2013-06-18 VITALS — BP 140/99 | HR 92 | Temp 98.2°F | Ht <= 58 in | Wt 103.0 lb

## 2013-06-18 DIAGNOSIS — Z113 Encounter for screening for infections with a predominantly sexual mode of transmission: Secondary | ICD-10-CM

## 2013-06-18 DIAGNOSIS — B1920 Unspecified viral hepatitis C without hepatic coma: Secondary | ICD-10-CM

## 2013-06-18 DIAGNOSIS — F1910 Other psychoactive substance abuse, uncomplicated: Secondary | ICD-10-CM

## 2013-06-18 DIAGNOSIS — Z79899 Other long term (current) drug therapy: Secondary | ICD-10-CM

## 2013-06-18 DIAGNOSIS — E876 Hypokalemia: Secondary | ICD-10-CM

## 2013-06-18 DIAGNOSIS — B20 Human immunodeficiency virus [HIV] disease: Secondary | ICD-10-CM

## 2013-06-18 LAB — LIPID PANEL
CHOL/HDL RATIO: 3.1 ratio
CHOLESTEROL: 136 mg/dL (ref 0–200)
HDL: 44 mg/dL (ref >39)
LDL Cholesterol: 78 mg/dL (ref 0–99)
Triglycerides: 72 mg/dL (ref <150)
VLDL: 14 mg/dL (ref 0–40)

## 2013-06-18 LAB — COMPLETE METABOLIC PANEL WITH GFR
ALK PHOS: 59 U/L (ref 39–117)
ALT: 38 U/L — AB (ref 0–35)
AST: 56 U/L — ABNORMAL HIGH (ref 0–37)
Albumin: 4.3 g/dL (ref 3.5–5.2)
BILIRUBIN TOTAL: 0.6 mg/dL (ref 0.2–1.2)
BUN: 11 mg/dL (ref 6–23)
CO2: 27 mEq/L (ref 19–32)
Calcium: 9.4 mg/dL (ref 8.4–10.5)
Chloride: 107 mEq/L (ref 96–112)
Creat: 0.67 mg/dL (ref 0.50–1.10)
GFR, Est African American: >89 mL/min
GFR, Est Non African American: >89 mL/min
Glucose, Bld: 99 mg/dL (ref 70–99)
Potassium: 3.7 mEq/L (ref 3.5–5.3)
Sodium: 142 mEq/L (ref 135–145)
Total Protein: 7.4 g/dL (ref 6.0–8.3)

## 2013-06-18 LAB — POCT URINE PREGNANCY: Preg Test, Ur: NEGATIVE

## 2013-06-18 MED ORDER — MEGA MULTIVITAMIN FOR WOMEN PO TABS: 1.0000 | ORAL_TABLET | Freq: Every day | ORAL | Status: DC

## 2013-06-18 NOTE — Assessment & Plan Note (Signed)
Schedule with Ashley [name]

## 2013-06-18 NOTE — Assessment & Plan Note (Addendum)
Needs to get on meds. Emphasized compliance.    Check pregnancy - Addendum: not pregnant  RTC 2 weeks.   40 minutes spent with patient including 20 minutes of counseling on complaince.

## 2013-06-18 NOTE — Assessment & Plan Note (Signed)
Will need to get HIV under control.

## 2013-06-18 NOTE — Progress Notes (Signed)
  Subjective:    Patient ID: Ashley [name], female    DOB: [birth year], 48 y.o.   MRN: [id number]  HPI  She comes in for followup of her HIV. She has had sporadic care and more recently was at [facility]. She was seen 2 years ago to reestablish care here and baseline labs were done. She was to start on Tivicay and Truvada, not wanting to take norvir and prezista due to size.  She thinks she may be pregnant.  Also first states that she has been on meds, but not after further discussion.  Also quit drugs in December.     Review of Systems  Constitutional: Negative for fever, fatigue and unexpected weight change.  HENT: Negative for mouth sores and sore throat.   Respiratory: Negative for cough and shortness of breath.   Cardiovascular: Negative for palpitations and leg swelling.  Gastrointestinal: Negative for nausea, abdominal pain and diarrhea.  Musculoskeletal: Negative for arthralgias, joint swelling and myalgias.  Neurological: Negative for dizziness, light-headedness and headaches.       Objective:   Physical Exam  Constitutional: She appears well-developed and well-nourished. No distress.  HENT:  Mouth/Throat: No oropharyngeal exudate.  Eyes: Right eye exhibits no discharge. Left eye exhibits no discharge. No scleral icterus.  Cardiovascular: Normal rate, regular rhythm and normal heart sounds.  Exam reveals no gallop and no friction rub.   No murmur heard. Pulmonary/Chest: Effort normal and breath sounds normal. No respiratory distress. She has no wheezes.  Lymphadenopathy:    She has no cervical adenopathy.          Assessment & Plan:

## 2013-06-19 LAB — CBC WITH DIFFERENTIAL/PLATELET
Basophils Absolute: 0 10*3/uL (ref 0.0–0.1)
Basophils Relative: 0 % (ref 0–1)
EOS ABS: 0.1 10*3/uL (ref 0.0–0.7)
EOS PCT: 3 % (ref 0–5)
HCT: 35.5 % — ABNORMAL LOW (ref 36.0–46.0)
HEMOGLOBIN: 12.3 g/dL (ref 12.0–15.0)
LYMPHS ABS: 1.2 10*3/uL (ref 0.7–4.0)
LYMPHS PCT: 48 % — AB (ref 12–46)
MCH: 30.8 pg (ref 26.0–34.0)
MCHC: 34.6 g/dL (ref 30.0–36.0)
MCV: 89 fL (ref 78.0–100.0)
MONOS PCT: 13 % — AB (ref 3–12)
Monocytes Absolute: 0.3 10*3/uL (ref 0.1–1.0)
Neutro Abs: 0.9 10*3/uL — ABNORMAL LOW (ref 1.7–7.7)
Neutrophils Relative %: 36 % — ABNORMAL LOW (ref 43–77)
Platelets: 146 10*3/uL — ABNORMAL LOW (ref 150–400)
RBC: 3.99 MIL/uL (ref 3.87–5.11)
RDW: 14.2 % (ref 11.5–15.5)
WBC: 2.6 10*3/uL — AB (ref 4.0–10.5)

## 2013-06-19 LAB — T-HELPER CELL (CD4) - (RCID CLINIC ONLY)
CD4 % Helper T Cell: 29 % — ABNORMAL LOW (ref 33–55)
CD4 T CELL ABS: 360 /uL — AB (ref 400–2700)

## 2013-06-19 LAB — URINE CYTOLOGY ANCILLARY ONLY
Chlamydia: NEGATIVE
NEISSERIA GONORRHEA: NEGATIVE

## 2013-06-19 LAB — RPR

## 2013-06-19 LAB — PATHOLOGIST SMEAR REVIEW

## 2013-06-20 LAB — HIV-1 RNA ULTRAQUANT REFLEX TO GENTYP+
HIV 1 RNA Quant: 23179 copies/mL — ABNORMAL HIGH (ref <20)
HIV-1 RNA Quant, Log: 4.37 {Log} — ABNORMAL HIGH (ref <1.30)

## 2013-06-24 ENCOUNTER — Ambulatory Visit: Payer: Medicaid Other | Admitting: *Deleted

## 2013-06-26 LAB — HLA B*5701: HLA-B*5701 w/rflx HLA-B High: NEGATIVE

## 2013-07-02 LAB — HIV-1 GENOTYPR PLUS

## 2013-07-03 ENCOUNTER — Telehealth: Payer: Self-pay | Admitting: *Deleted

## 2013-07-03 NOTE — Telephone Encounter (Signed)
RN called patient to let her know she needs to make a follow up appointment.  Pt stated that she would call back for this, as she was currently at [facility] with her mother.   Landis Gandy, RN

## 2013-07-03 NOTE — Telephone Encounter (Signed)
Message copied by Landis Gandy on Thu Jul 03, 2013  4:39 PM ------      Message from: Thayer Headings      Created: Thu Jul 03, 2013  1:11 PM       She was supposed to have a follow up appt about now but looks like she never made a follow up after her last appt.  Her genotype is back and she needs to come in and get started on meds.  thanks ------

## 2013-07-11 ENCOUNTER — Emergency Department (HOSPITAL_COMMUNITY)
Admission: EM | Admit: 2013-07-11 | Discharge: 2013-07-12 | Disposition: A | Discharge status: Home / Self Care | Payer: Medicaid Other | Attending: Emergency Medicine | Admitting: Emergency Medicine

## 2013-07-11 ENCOUNTER — Encounter (HOSPITAL_COMMUNITY): Payer: Self-pay | Admitting: Emergency Medicine

## 2013-07-11 DIAGNOSIS — E119 Type 2 diabetes mellitus without complications: Secondary | ICD-10-CM | POA: Insufficient documentation

## 2013-07-11 DIAGNOSIS — F3289 Other specified depressive episodes: Secondary | ICD-10-CM | POA: Insufficient documentation

## 2013-07-11 DIAGNOSIS — S8990XA Unspecified injury of unspecified lower leg, initial encounter: Secondary | ICD-10-CM | POA: Insufficient documentation

## 2013-07-11 DIAGNOSIS — Y929 Unspecified place or not applicable: Secondary | ICD-10-CM | POA: Insufficient documentation

## 2013-07-11 DIAGNOSIS — Z8739 Personal history of other diseases of the musculoskeletal system and connective tissue: Secondary | ICD-10-CM | POA: Insufficient documentation

## 2013-07-11 DIAGNOSIS — S99919A Unspecified injury of unspecified ankle, initial encounter: Principal | ICD-10-CM

## 2013-07-11 DIAGNOSIS — Z8619 Personal history of other infectious and parasitic diseases: Secondary | ICD-10-CM | POA: Insufficient documentation

## 2013-07-11 DIAGNOSIS — M549 Dorsalgia, unspecified: Secondary | ICD-10-CM | POA: Insufficient documentation

## 2013-07-11 DIAGNOSIS — W208XXA Other cause of strike by thrown, projected or falling object, initial encounter: Secondary | ICD-10-CM | POA: Insufficient documentation

## 2013-07-11 DIAGNOSIS — F329 Major depressive disorder, single episode, unspecified: Secondary | ICD-10-CM | POA: Insufficient documentation

## 2013-07-11 DIAGNOSIS — M79673 Pain in unspecified foot: Secondary | ICD-10-CM

## 2013-07-11 DIAGNOSIS — Z21 Asymptomatic human immunodeficiency virus [HIV] infection status: Secondary | ICD-10-CM | POA: Insufficient documentation

## 2013-07-11 DIAGNOSIS — Z8611 Personal history of tuberculosis: Secondary | ICD-10-CM | POA: Insufficient documentation

## 2013-07-11 DIAGNOSIS — S99929A Unspecified injury of unspecified foot, initial encounter: Principal | ICD-10-CM

## 2013-07-11 DIAGNOSIS — Z8781 Personal history of (healed) traumatic fracture: Secondary | ICD-10-CM | POA: Insufficient documentation

## 2013-07-11 DIAGNOSIS — F411 Generalized anxiety disorder: Secondary | ICD-10-CM | POA: Insufficient documentation

## 2013-07-11 DIAGNOSIS — I10 Essential (primary) hypertension: Secondary | ICD-10-CM | POA: Insufficient documentation

## 2013-07-11 DIAGNOSIS — R51 Headache: Secondary | ICD-10-CM | POA: Insufficient documentation

## 2013-07-11 DIAGNOSIS — Z8719 Personal history of other diseases of the digestive system: Secondary | ICD-10-CM | POA: Insufficient documentation

## 2013-07-11 DIAGNOSIS — F17200 Nicotine dependence, unspecified, uncomplicated: Secondary | ICD-10-CM | POA: Insufficient documentation

## 2013-07-11 DIAGNOSIS — Z8669 Personal history of other diseases of the nervous system and sense organs: Secondary | ICD-10-CM | POA: Insufficient documentation

## 2013-07-11 DIAGNOSIS — Z3202 Encounter for pregnancy test, result negative: Secondary | ICD-10-CM | POA: Insufficient documentation

## 2013-07-11 DIAGNOSIS — Z79899 Other long term (current) drug therapy: Secondary | ICD-10-CM | POA: Insufficient documentation

## 2013-07-11 DIAGNOSIS — Y939 Activity, unspecified: Secondary | ICD-10-CM | POA: Insufficient documentation

## 2013-07-11 LAB — POC URINE PREG, ED: PREG TEST UR: NEGATIVE

## 2013-07-11 NOTE — ED Notes (Signed)
Pt. presents with multiple complaints : right foot pain injured this week - ambulatory , itchy skin rashes at both arms , low back pain and headache . Respirations unlabored . Denies fever or chills.

## 2013-07-11 NOTE — ED Provider Notes (Signed)
CSN: [id number]     Arrival date & time 07/11/13  2229 History  This chart was scribed for Montine Circle, PA, working with Richarda Blade, MD, by Elby Beck ED Scribe. This patient was seen in room TR09C/TR09C and the patient's care was started at 10:43 PM.   Chief Complaint  Patient presents with  . Foot Pain    The history is provided by the patient. No language interpreter was used.    HPI Comments: Ashley [name] is a 48 y.o. female with a history of HIV, Hep C, DM and HTN who presents to the Emergency Department with a chief complaint of intermittent, moderate dorsal right foot pain over the past week. She states that the pain began after she dropped an object on her foot. She states that she had an injury to her right foot in 2011 and that she has had flare-ups of right foot pain since 2011. Her pain is worsened with walking. She states that she has not taken any medications for her pain.  She has multiple other complaints today. She is complaining of right-sided back pain as well as a headache over the past few days. She also has a history of HTN, and believes that her blood pressure has been elevated. Her blood pressure was taken to be 175/104 in the ED. She further states that she might be pregnant and she is requesting a pregnancy test today.   Past Medical History  Diagnosis Date  . HIV (human immunodeficiency virus infection)   . Hepatitis C   . Diabetes mellitus   . Anxiety   . Pinched nerve   . Scoliosis   . Hernia   . Ankle fracture   . Hypertension   . TB (tuberculosis)   . Depression    Past Surgical History  Procedure Laterality Date  . Cholecystectomy    . Cesarean section    . Hernia repair    . Liver biopsy     Family History  Problem Relation Age of Onset  . Diabetes Mother   . Hypertension Mother    History  Substance Use Topics  . Smoking status: Current Every Day Smoker -- 1.00 packs/day    Types: Cigarettes  . Smokeless tobacco: Never Used   . Alcohol Use: Yes     Comment: occasional   OB History   Grav Para Term Preterm Abortions TAB SAB Ect Mult Living   2 2        2      Review of Systems  Musculoskeletal: Positive for back pain.       Dorsal right foot pain  Neurological: Positive for headaches.    Allergies  Review of patient's allergies indicates no known allergies.  Home Medications   Prior to Admission medications   Medication Sig Start Date End Date Taking? Authorizing Provider  acetaminophen (TYLENOL) 500 MG tablet Take 2 tablets (1,000 mg total) by mouth 3 (three) times daily as needed for pain. 10/05/12   Liam Graham, PA-C  albuterol (PROVENTIL HFA;VENTOLIN HFA) 108 (90 BASE) MCG/ACT inhaler Inhale 2 puffs into the lungs every 6 (six) hours as needed. Wheezing and shortness of breath    Historical Provider, MD  amLODipine (NORVASC) 10 MG tablet Take 10 mg by mouth daily.    Historical Provider, MD  cetirizine (ZYRTEC) 10 MG tablet Take 10 mg by mouth daily.    Historical Provider, MD  cloNIDine (CATAPRES) 0.2 MG tablet Take 0.2 mg by mouth daily.  Historical Provider, MD  dolutegravir (TIVICAY) 50 MG tablet Take 1 tablet (50 mg total) by mouth daily. 07/30/12   Thayer Headings, MD  emtricitabine-tenofovir (TRUVADA) 200-300 MG per tablet Take 1 tablet by mouth daily. 07/30/12   Thayer Headings, MD  esomeprazole (NEXIUM) 40 MG capsule Take 1 capsule (40 mg total) by mouth daily. 03/21/12   Montine Circle, PA-C  FLUoxetine (PROZAC) 40 MG capsule Take 40 mg by mouth daily.    Historical Provider, MD  hydrOXYzine (ATARAX/VISTARIL) 25 MG tablet Take 25 mg by mouth 2 (two) times daily as needed. For itching    Historical Provider, MD  lisinopril (PRINIVIL,ZESTRIL) 10 MG tablet Take 40 mg by mouth daily.  12/07/11   Nicole Pisciotta, PA-C  Multiple Vitamins-Minerals (MEGA MULTIVITAMIN FOR WOMEN) TABS Take 1 tablet by mouth daily. 06/18/13   Thayer Headings, MD  QUEtiapine (SEROQUEL) 300 MG tablet Take 300 mg by mouth at  bedtime.    Historical Provider, MD  traMADol (ULTRAM) 50 MG tablet Take 50 mg by mouth every 6 (six) hours as needed. For pain 12/19/11   Montine Circle, PA-C  trazodone (DESYREL) 300 MG tablet Take 300 mg by mouth at bedtime.    Historical Provider, MD   Triage Vitals: BP 175/104  Pulse 90  Temp(Src) 98.2 F (36.8 C) (Oral)  Resp 20  SpO2 97%  Physical Exam  Nursing note and vitals reviewed. Constitutional: She is oriented to person, place, and time. She appears well-developed and well-nourished.  HENT:  Head: Normocephalic and atraumatic.  Eyes: Conjunctivae and EOM are normal. Pupils are equal, round, and reactive to light.  Neck: Normal range of motion. Neck supple.  Cardiovascular: Normal rate and regular rhythm.  Exam reveals no gallop and no friction rub.   No murmur heard. Pulmonary/Chest: Effort normal and breath sounds normal. No respiratory distress. She has no wheezes. She has no rales. She exhibits no tenderness.  Abdominal: Soft. She exhibits no distension and no mass. There is no tenderness. There is no rebound and no guarding.  Musculoskeletal: Normal range of motion. She exhibits no edema and no tenderness.  Right foot nontender to palpation, 5/5 range of motion and strength  Neurological: She is alert and oriented to person, place, and time.  Skin: Skin is warm and dry.  Psychiatric: She has a normal mood and affect. Her behavior is normal. Judgment and thought content normal.    ED Course  Procedures (including critical care time)  DIAGNOSTIC STUDIES: Oxygen Saturation is 97% on RA, normal by my interpretation.    COORDINATION OF CARE: 10:51 PM- Discussed plan to order an X-ray of pt's right foot. Will also order a pregnancy test. Pt advised of plan for treatment and pt agrees.  Labs Review Labs Reviewed  POC URINE PREG, ED    Imaging Review No results found.   EKG Interpretation None      MDM   Final diagnoses:  Foot pain  Pregnancy test  negative    X-ray of foot is negative, but could have possible non-union of medial malleolus.  She ambulates without difficulty and pregnancy test negative. Patient insists that she is pregnant, stating that her father told her mother in a dream that she was pregnant. I told her that if she like to repeat a pregnancy test, she can do so tomorrow.  12:33 AM Patient eloped prior to return of plain films.   I personally performed the services described in this documentation, which was scribed in  my presence. The recorded information has been reviewed and is accurate.     Montine Circle, PA-C 07/12/13 [phone]

## 2013-07-12 ENCOUNTER — Emergency Department (HOSPITAL_COMMUNITY): Payer: Medicaid Other

## 2013-07-12 NOTE — ED Provider Notes (Signed)
Medical screening examination/treatment/procedure(s) were performed by non-physician practitioner and as supervising physician I was immediately available for consultation/collaboration.  Richarda Blade, MD 07/12/13 [phone]

## 2013-07-12 NOTE — Discharge Instructions (Signed)
Pregnancy Tests HOW DO PREGNANCY TESTS WORK? All pregnancy tests look for a special hormone in the urine or blood that is only present in pregnant women. This hormone, human chorionic gonadotropin (hCG), is also called the pregnancy hormone.  WHAT IS THE DIFFERENCE BETWEEN A URINE AND A BLOOD PREGNANCY TEST? IS ONE BETTER THAN THE OTHER? There are two types of pregnancy tests.  Blood tests.  Urine tests. Both tests look for the presence of hCG, the pregnancy hormone. Many women use a urine test or home pregnancy test (HPT) to find out if they are pregnant. HPTs are cheap, easy to use, can be done at home, and are private. When a woman has a positive result on an HPT, she needs to see her caregiver right away. The caregiver can confirm a positive HPT result with another urine test, a blood test, ultrasound, and a pelvic exam.  There are two types of blood tests you can get from a caregiver.   A quantitative blood test (or the beta hCG test). This test measures the exact amount of hCG in the blood. This means it can pick up very small amounts of hCG, making it a very accurate test.  A qualitative hCG blood test. This test gives a simple yes or no answer to whether you are pregnant. This test is more like a urine test in terms of its accuracy. Blood tests can pick up hCG earlier in a pregnancy than urine tests can. Blood tests can tell if you are pregnant about 6 to 8 days after you release an egg from an ovary (ovulate). Urine tests can determine pregnancy about 2 weeks after ovulation.  HOW IS A HOME PREGNANCY TEST DONE?  There are many types of home pregnancy tests or HPTs that can be bought over-the-counter at drug or discount stores.   Some involve collecting your urine in a cup and dipping a stick into the urine or putting some of the urine into a special container with an eyedropper.  Others are done by placing a stick into your urine stream.  Tests vary in how long you need to wait for  the stick or container to turn a certain color or have a symbol on it (like a plus or a minus).  All tests come with written instructions. Most tests also have toll-free phone numbers to call if you have any questions about how to do the test or read the results. HOW ACCURATE ARE HOME PREGNANCY TESTS?  HPTs are very accurate. Most brands of HPTs say they are 97% to 99% accurate when taken 1 week after missing your menstrual period, but this can vary with actual use. Each brand varies in how sensitive it is in picking up the pregnancy hormone hCG. If a test is not done correctly, it will be less accurate. Always check the package to make sure it is not past its expiration date. If it is, it will not be accurate. Most brands of HPTs tell users to do the test again in a few days, no matter what the results.  If you use an HPT too early in your pregnancy, you may not have enough of the pregnancy hormone hCG in your urine to have a positive test result. Most HPTs will be accurate if you test yourself around the time your period is due (about 2 weeks after you ovulate). You can get a negative test result if you are not pregnant or if you ovulated later than you thought you did.  You may also have problems with the pregnancy, which affects the amount of hCG you have in your urine. If your HPT is negative, test yourself again within a few days to 1 week. If you keep getting a negative result and think you are pregnant, talk with your caregiver right away about getting a blood pregnancy test.  FALSE POSITIVE PREGNANCY TEST A false positive HPT can happen if there is blood or protein present in your urine. A false positive can also happen if you were recently pregnant or if you take a pregnancy test too soon after taking fertility drug that contains hCG. Also, some prescription medicines such as water pills (diuretics), tranquilizers, seizure medicines, psychiatric medicines, and allergy and nausea medicines  (promethazine) give false positive readings. FALSE NEGATIVE PREGNANCY TEST  A false negative HPT can happen if you do the test too early. Try to wait until you are at least 1 day late for your menstrual period.  It may happen if you wait too long to test the urine (longer than 15 minutes).  It may also happen if the urine is too diluted because you drank a lot of fluids before getting the urine sample. It is best to test the first morning urine after you get out of bed. If your menstrual period did not start after a week of a negative HPT, repeat the pregnancy test. CAN ANYTHING INTERFERE WITH HOME PREGNANCY TEST RESULTS?  Most medicines, both over-the-counter and prescription drugs, including birth control pills and antibiotics, should not affect the results of a HPT. Only those drugs that have the pregnancy hormone hCG in them can give a false positive test result. Drugs that have hCG in them may be used for treating infertility (not being able to get pregnant). Alcohol and illegal drugs do not affect HPT results, but you should not be using these substances if you are trying to get pregnant. If you have a positive pregnancy test, call your caregiver to make an appointment to begin prenatal care. Document Released: 02/16/2003 Document Revised: 05/08/2011 Document Reviewed: 04/29/2010 [facility] Patient Information 2014 [place], [place]. Arthralgia Your caregiver has diagnosed you as suffering from an arthralgia. Arthralgia means there is pain in a joint. This can come from many reasons including:  Bruising the joint which causes soreness (inflammation) in the joint.  Wear and tear on the joints which occur as we grow older (osteoarthritis).  Overusing the joint.  Various forms of arthritis.  Infections of the joint. Regardless of the cause of pain in your joint, most of these different pains respond to anti-inflammatory drugs and rest. The exception to this is when a joint is infected, and  these cases are treated with antibiotics, if it is a bacterial infection. HOME CARE INSTRUCTIONS   Rest the injured area for as long as directed by your caregiver. Then slowly start using the joint as directed by your caregiver and as the pain allows. Crutches as directed may be useful if the ankles, knees or hips are involved. If the knee was splinted or casted, continue use and care as directed. If an stretchy or elastic wrapping bandage has been applied today, it should be removed and re-applied every 3 to 4 hours. It should not be applied tightly, but firmly enough to keep swelling down. Watch toes and feet for swelling, bluish discoloration, coldness, numbness or excessive pain. If any of these problems (symptoms) occur, remove the ace bandage and re-apply more loosely. If these symptoms persist, contact your caregiver or  return to this location.  For the first 24 hours, keep the injured extremity elevated on pillows while lying down.  Apply ice for 15-20 minutes to the sore joint every couple hours while awake for the first half day. Then 03-04 times per day for the first 48 hours. Put the ice in a plastic bag and place a towel between the bag of ice and your skin.  Wear any splinting, casting, elastic bandage applications, or slings as instructed.  Only take over-the-counter or prescription medicines for pain, discomfort, or fever as directed by your caregiver. Do not use aspirin immediately after the injury unless instructed by your physician. Aspirin can cause increased bleeding and bruising of the tissues.  If you were given crutches, continue to use them as instructed and do not resume weight bearing on the sore joint until instructed. Persistent pain and inability to use the sore joint as directed for more than 2 to 3 days are warning signs indicating that you should see a caregiver for a follow-up visit as soon as possible. Initially, a hairline fracture (break in bone) may not be evident  on X-rays. Persistent pain and swelling indicate that further evaluation, non-weight bearing or use of the joint (use of crutches or slings as instructed), or further X-rays are indicated. X-rays may sometimes not show a small fracture until a week or 10 days later. Make a follow-up appointment with your own caregiver or one to whom we have referred you. A radiologist (specialist in reading X-rays) may read your X-rays. Make sure you know how you are to obtain your X-ray results. Do not assume everything is normal if you do not hear from [place]. SEEK MEDICAL CARE IF: Bruising, swelling, or pain increases. SEEK IMMEDIATE MEDICAL CARE IF:   Your fingers or toes are numb or blue.  The pain is not responding to medications and continues to stay the same or get worse.  The pain in your joint becomes severe.  You develop a fever over 102 F (38.9 C).  It becomes impossible to move or use the joint. MAKE SURE YOU:   Understand these instructions.  Will watch your condition.  Will get help right away if you are not doing well or get worse. Document Released: 02/13/2005 Document Revised: 05/08/2011 Document Reviewed: 10/02/2007 [facility] Patient Information 2014 [place].

## 2013-07-12 NOTE — ED Notes (Signed)
Pt reports that pregnancy test is wrong and that she believes she is pregnant and that she has been having milk from her breast for 2 months.

## 2013-07-17 ENCOUNTER — Other Ambulatory Visit: Payer: Medicaid Other

## 2013-07-17 ENCOUNTER — Telehealth: Payer: Self-pay

## 2013-07-17 DIAGNOSIS — N926 Irregular menstruation, unspecified: Secondary | ICD-10-CM

## 2013-07-17 NOTE — Telephone Encounter (Signed)
Patient walked into clinic with c/o abdominal distention.  She states" I am having nausea in the mornings , breast tenderness and I can feel my baby moving". She gives history of negative urine test with her previous pregnancies. Hx of missed period.   She has two sons who are 79 and 73.  We will draw a blood pregnancy test. It appears patient's abdomen is distended and told her this does not confirm a pregnancy. She was advised to call on Friday afternoon for results. If pregnancy test is negative she will need evaluation by a physician.   Laverle Patter, RN

## 2013-07-18 LAB — HCG, SERUM, QUALITATIVE: Preg, Serum: NEGATIVE

## 2013-07-21 ENCOUNTER — Emergency Department (HOSPITAL_COMMUNITY)
Admission: EM | Admit: 2013-07-21 | Discharge: 2013-07-21 | Disposition: A | Discharge status: Home / Self Care | Payer: Medicaid Other | Source: Home / Self Care | Attending: Emergency Medicine | Admitting: Emergency Medicine

## 2013-07-21 ENCOUNTER — Encounter (HOSPITAL_COMMUNITY): Payer: Self-pay | Admitting: Emergency Medicine

## 2013-07-21 ENCOUNTER — Emergency Department (HOSPITAL_COMMUNITY)
Admission: EM | Admit: 2013-07-21 | Discharge: 2013-07-21 | Discharge status: Against Medical Advice | Payer: Medicaid Other | Attending: Emergency Medicine | Admitting: Emergency Medicine

## 2013-07-21 DIAGNOSIS — I10 Essential (primary) hypertension: Secondary | ICD-10-CM | POA: Insufficient documentation

## 2013-07-21 DIAGNOSIS — R109 Unspecified abdominal pain: Secondary | ICD-10-CM

## 2013-07-21 DIAGNOSIS — Z8611 Personal history of tuberculosis: Secondary | ICD-10-CM | POA: Insufficient documentation

## 2013-07-21 DIAGNOSIS — F3289 Other specified depressive episodes: Secondary | ICD-10-CM | POA: Insufficient documentation

## 2013-07-21 DIAGNOSIS — F411 Generalized anxiety disorder: Secondary | ICD-10-CM | POA: Insufficient documentation

## 2013-07-21 DIAGNOSIS — K429 Umbilical hernia without obstruction or gangrene: Secondary | ICD-10-CM | POA: Insufficient documentation

## 2013-07-21 DIAGNOSIS — Z87828 Personal history of other (healed) physical injury and trauma: Secondary | ICD-10-CM | POA: Insufficient documentation

## 2013-07-21 DIAGNOSIS — Z8781 Personal history of (healed) traumatic fracture: Secondary | ICD-10-CM | POA: Insufficient documentation

## 2013-07-21 DIAGNOSIS — E119 Type 2 diabetes mellitus without complications: Secondary | ICD-10-CM | POA: Insufficient documentation

## 2013-07-21 DIAGNOSIS — Z3200 Encounter for pregnancy test, result unknown: Secondary | ICD-10-CM | POA: Insufficient documentation

## 2013-07-21 DIAGNOSIS — Z8669 Personal history of other diseases of the nervous system and sense organs: Secondary | ICD-10-CM | POA: Insufficient documentation

## 2013-07-21 DIAGNOSIS — Z8719 Personal history of other diseases of the digestive system: Secondary | ICD-10-CM | POA: Insufficient documentation

## 2013-07-21 DIAGNOSIS — F329 Major depressive disorder, single episode, unspecified: Secondary | ICD-10-CM | POA: Insufficient documentation

## 2013-07-21 DIAGNOSIS — F17200 Nicotine dependence, unspecified, uncomplicated: Secondary | ICD-10-CM | POA: Insufficient documentation

## 2013-07-21 DIAGNOSIS — Z9089 Acquired absence of other organs: Secondary | ICD-10-CM | POA: Insufficient documentation

## 2013-07-21 DIAGNOSIS — Z21 Asymptomatic human immunodeficiency virus [HIV] infection status: Secondary | ICD-10-CM | POA: Insufficient documentation

## 2013-07-21 DIAGNOSIS — R141 Gas pain: Secondary | ICD-10-CM | POA: Insufficient documentation

## 2013-07-21 DIAGNOSIS — R112 Nausea with vomiting, unspecified: Secondary | ICD-10-CM | POA: Insufficient documentation

## 2013-07-21 DIAGNOSIS — Z8619 Personal history of other infectious and parasitic diseases: Secondary | ICD-10-CM | POA: Insufficient documentation

## 2013-07-21 DIAGNOSIS — M4120 Other idiopathic scoliosis, site unspecified: Secondary | ICD-10-CM | POA: Insufficient documentation

## 2013-07-21 DIAGNOSIS — R142 Eructation: Secondary | ICD-10-CM

## 2013-07-21 DIAGNOSIS — Z9889 Other specified postprocedural states: Secondary | ICD-10-CM | POA: Insufficient documentation

## 2013-07-21 DIAGNOSIS — Z79899 Other long term (current) drug therapy: Secondary | ICD-10-CM | POA: Insufficient documentation

## 2013-07-21 DIAGNOSIS — R143 Flatulence: Secondary | ICD-10-CM

## 2013-07-21 DIAGNOSIS — Z8739 Personal history of other diseases of the musculoskeletal system and connective tissue: Secondary | ICD-10-CM | POA: Insufficient documentation

## 2013-07-21 LAB — COMPREHENSIVE METABOLIC PANEL
ALT: 62 U/L — ABNORMAL HIGH (ref 0–35)
AST: 80 U/L — ABNORMAL HIGH (ref 0–37)
Albumin: 4 g/dL (ref 3.5–5.2)
Alkaline Phosphatase: 69 U/L (ref 39–117)
BUN: 13 mg/dL (ref 6–23)
CALCIUM: 9.7 mg/dL (ref 8.4–10.5)
CO2: 26 meq/L (ref 19–32)
Chloride: 107 mEq/L (ref 96–112)
Creatinine, Ser: 0.72 mg/dL (ref 0.50–1.10)
GLUCOSE: 80 mg/dL (ref 70–99)
Potassium: 4.4 mEq/L (ref 3.7–5.3)
Sodium: 145 mEq/L (ref 137–147)
Total Bilirubin: 0.4 mg/dL (ref 0.3–1.2)
Total Protein: 7.7 g/dL (ref 6.0–8.3)

## 2013-07-21 LAB — CBC
HCT: 33.1 % — ABNORMAL LOW (ref 36.0–46.0)
HEMOGLOBIN: 11.6 g/dL — AB (ref 12.0–15.0)
MCH: 31.9 pg (ref 26.0–34.0)
MCHC: 35 g/dL (ref 30.0–36.0)
MCV: 90.9 fL (ref 78.0–100.0)
Platelets: 148 10*3/uL — ABNORMAL LOW (ref 150–400)
RBC: 3.64 MIL/uL — AB (ref 3.87–5.11)
RDW: 14.7 % (ref 11.5–15.5)
WBC: 2.7 10*3/uL — ABNORMAL LOW (ref 4.0–10.5)

## 2013-07-21 LAB — LIPASE, BLOOD: LIPASE: 53 U/L (ref 11–59)

## 2013-07-21 NOTE — ED Notes (Signed)
Pt seen earlier today for abdominal pain and checked out AMA.  Pt presents now requesting a pregnancy test.  Pt denies pain at this time.

## 2013-07-21 NOTE — ED Provider Notes (Signed)
CSN: [id number]     Arrival date & time 07/21/13  6967 History   First MD Initiated Contact with Patient 07/21/13 0701     No chief complaint on file.    (Consider location/radiation/quality/duration/timing/severity/associated sxs/prior Treatment) HPI Pt presents with concern for being pregnant.  She states she feels nausea in the morning and that her abdomen is more distended than usual.  She was seen at ID clinic and had pregnancy test last week- she states she was not given the results so she assumes she is pregnant.  LMP 17 years ago.  She denies abdominal pain, no vaginal bleeding.  Per chart review serum pregnancy test is negative.  She has hx of umbilical hernia from prior pregnancy and feels this area is getting larger.  No fever/chills.  There are no other associated systemic symptoms, there are no other alleviating or modifying factors.   Past Medical History  Diagnosis Date  . HIV (human immunodeficiency virus infection)   . Hepatitis C   . Diabetes mellitus   . Anxiety   . Pinched nerve   . Scoliosis   . Hernia   . Ankle fracture   . Hypertension   . TB (tuberculosis)   . Depression    Past Surgical History  Procedure Laterality Date  . Cholecystectomy    . Cesarean section    . Hernia repair    . Liver biopsy     Family History  Problem Relation Age of Onset  . Diabetes Mother   . Hypertension Mother    History  Substance Use Topics  . Smoking status: Current Every Day Smoker -- 1.00 packs/day    Types: Cigarettes  . Smokeless tobacco: Never Used  . Alcohol Use: Yes     Comment: occasional   OB History   Grav Para Term Preterm Abortions TAB SAB Ect Mult Living   2 2        2      Review of Systems ROS reviewed and all otherwise negative except for mentioned in HPI    Allergies  Review of patient's allergies indicates no known allergies.  Home Medications   Prior to Admission medications   Medication Sig Start Date End Date Taking? Authorizing  Provider  acetaminophen (TYLENOL) 500 MG tablet Take 2 tablets (1,000 mg total) by mouth 3 (three) times daily as needed for pain. 10/05/12   Liam Graham, PA-C  albuterol (PROVENTIL HFA;VENTOLIN HFA) 108 (90 BASE) MCG/ACT inhaler Inhale 2 puffs into the lungs every 6 (six) hours as needed. Wheezing and shortness of breath    Historical Provider, MD  amLODipine (NORVASC) 10 MG tablet Take 10 mg by mouth daily.    Historical Provider, MD  cetirizine (ZYRTEC) 10 MG tablet Take 10 mg by mouth daily.    Historical Provider, MD  cloNIDine (CATAPRES) 0.2 MG tablet Take 0.2 mg by mouth daily.    Historical Provider, MD  dolutegravir (TIVICAY) 50 MG tablet Take 1 tablet (50 mg total) by mouth daily. 07/30/12   Thayer Headings, MD  emtricitabine-tenofovir (TRUVADA) 200-300 MG per tablet Take 1 tablet by mouth daily. 07/30/12   Thayer Headings, MD  esomeprazole (NEXIUM) 40 MG capsule Take 1 capsule (40 mg total) by mouth daily. 03/21/12   Montine Circle, PA-C  FLUoxetine (PROZAC) 40 MG capsule Take 40 mg by mouth daily.    Historical Provider, MD  hydrOXYzine (ATARAX/VISTARIL) 25 MG tablet Take 25 mg by mouth 2 (two) times daily as needed. For itching  Historical Provider, MD  lisinopril (PRINIVIL,ZESTRIL) 10 MG tablet Take 40 mg by mouth daily.  12/07/11   Nicole Pisciotta, PA-C  Multiple Vitamins-Minerals (MEGA MULTIVITAMIN FOR WOMEN) TABS Take 1 tablet by mouth daily. 06/18/13   Thayer Headings, MD  QUEtiapine (SEROQUEL) 300 MG tablet Take 300 mg by mouth at bedtime.    Historical Provider, MD  traMADol (ULTRAM) 50 MG tablet Take 50 mg by mouth every 6 (six) hours as needed. For pain 12/19/11   Montine Circle, PA-C  trazodone (DESYREL) 300 MG tablet Take 300 mg by mouth at bedtime.    Historical Provider, MD   BP 151/123  Pulse 74  Temp(Src) 98.9 F (37.2 C) (Oral)  Resp 18  Ht 4\' 11"  (1.499 m)  Wt 105 lb (47.628 kg)  BMI 21.20 kg/m2  SpO2 99% Vitals reviewed Physical Exam Physical Examination:  General appearance - alert, well appearing, and in no distress Mental status - alert, oriented to person, place, and time Eyes - no conjunctival injection, no scleral icterus Mouth - mucous membranes moist, pharynx normal without lesions Chest - clear to auscultation, no wheezes, rales or rhonchi, symmetric air entry Heart - normal rate, regular rhythm, normal S1, S2, no murmurs, rubs, clicks or gallops Abdomen - soft, nontender, nondistended, no masses or organomegaly, mild abdominal distension, palpable umbilical hernia easily reducible, ? Mass above umbilicus- firm- nontender   Extremities - peripheral pulses normal, no pedal edema, no clubbing or cyanosis Skin - normal coloration and turgor, no rashes  ED Course  Procedures (including critical care time)  11:01 AM late entry- pt left the ED prior to having all tests completed.  She states she needs to go to see her mother.  I have advised her that she is not pregnant, her abdominal distension may be due to her umbilical hernia or possibly other mass.  CT scan had been ordered but patient did not wish to stay for this to be done. She did not wait for me to discuss this further with her.  Labs Review Labs Reviewed  CBC - Abnormal; Notable for the following:    WBC 2.7 (*)    RBC 3.64 (*)    Hemoglobin 11.6 (*)    HCT 33.1 (*)    Platelets 148 (*)    All other components within normal limits  COMPREHENSIVE METABOLIC PANEL - Abnormal; Notable for the following:    AST 80 (*)    ALT 62 (*)    All other components within normal limits  LIPASE, BLOOD  URINALYSIS, ROUTINE W REFLEX MICROSCOPIC  PREGNANCY, URINE    Imaging Review No results found.   EKG Interpretation None      MDM   Final diagnoses:  Abdominal pain  Umbilical hernia    Pt presenting with c/o concern for pregnancy.  She is not pregnant based on serum test by ID last week.  She does have a possible mass versus scar tissue above site of umbilical hernia which  she states has been there chronically.  She continues to have a fixed belief that she is pregnant.  Workup ordered, however pt left the ED wtihout treatment complete.  She states she needed to go see a family member.  I attempted to talk with her further but she left the ED.      Threasa Beards, MD 07/21/13 [phone]

## 2013-07-21 NOTE — ED Notes (Signed)
Pt is tearful and states "I just want yall to find out what's wrong with me."

## 2013-07-21 NOTE — ED Notes (Signed)
Pt states that [facility] and had a preg test done. States that they did not call her back and so she is pregnant. States that she has not have a menstraul cycle in 17 years and so she is a high risk pregnancy. States that she is here for an [place] to see how far along she is. Pt is tearful at bedside.

## 2013-07-21 NOTE — ED Provider Notes (Signed)
CSN: [id number]     Arrival date & time 07/21/13  1256 History   First MD Initiated Contact with Patient 07/21/13 1317    This chart was scribed for Margarita Sermons, a non-physician practitioner working with Threasa Beards, MD by Denice Bors, ED Scribe. This patient was seen in room TR11C/TR11C and the patient's care was started at 2:54 PM      Chief Complaint  Patient presents with  . Possible Pregnancy     (Consider location/radiation/quality/duration/timing/severity/associated sxs/prior Treatment) The history is provided by the patient and medical records. No language interpreter was used.   HPI Comments: Ashley [name] is a 48 y.o. female who presents to the Emergency Department complaining of possible pregnancy. Reports associated nausea, and emesis for past 6 months. However, she reports that she has not vomite today.   Pt presents with concern for being pregnant. She states she feels nausea in the morning and that her abdomen is more distended than usual. She was seen at ID clinic and had pregnancy test last week- she states she was not given the results so she assumes she is pregnant. LMP 17 years ago. Per chart review serum pregnancy test is negative. There are no other associated systemic symptoms, and there are no other alleviating or modifying factors.    Pt was evaluated for the same earlier this morning, but left AMA.   Past Medical History  Diagnosis Date  . HIV (human immunodeficiency virus infection)   . Hepatitis C   . Diabetes mellitus   . Anxiety   . Pinched nerve   . Scoliosis   . Hernia   . Ankle fracture   . Hypertension   . TB (tuberculosis)   . Depression    Past Surgical History  Procedure Laterality Date  . Cholecystectomy    . Cesarean section    . Hernia repair    . Liver biopsy     Family History  Problem Relation Age of Onset  . Diabetes Mother   . Hypertension Mother    History  Substance Use Topics  . Smoking status:  Current Every Day Smoker -- 1.00 packs/day    Types: Cigarettes  . Smokeless tobacco: Never Used  . Alcohol Use: Yes     Comment: occasional   OB History   Grav Para Term Preterm Abortions TAB SAB Ect Mult Living   2 2        2      Review of Systems  Constitutional: Negative for fever.  Gastrointestinal: Positive for nausea and vomiting. Negative for abdominal pain.  Genitourinary: Negative for vaginal bleeding.      Allergies  Review of patient's allergies indicates no known allergies.  Home Medications   Prior to Admission medications   Medication Sig Start Date End Date Taking? Authorizing Provider  amLODipine (NORVASC) 10 MG tablet Take 10 mg by mouth daily.   Yes Historical Provider, MD  cetirizine (ZYRTEC) 10 MG tablet Take 10 mg by mouth daily.   Yes Historical Provider, MD  cloNIDine (CATAPRES) 0.2 MG tablet Take 0.2 mg by mouth daily.   Yes Historical Provider, MD  ibuprofen (ADVIL,MOTRIN) 200 MG tablet Take 800 mg by mouth every 6 (six) hours as needed.   Yes Historical Provider, MD  LISINOPRIL PO Take 1 tablet by mouth daily.   Yes Historical Provider, MD  trazodone (DESYREL) 300 MG tablet Take 300 mg by mouth at bedtime.   Yes Historical Provider, MD  albuterol (PROVENTIL HFA;VENTOLIN HFA)  108 (90 BASE) MCG/ACT inhaler Inhale 2 puffs into the lungs every 6 (six) hours as needed. Wheezing and shortness of breath    Historical Provider, MD   BP 136/88  Pulse 69  Temp(Src) 98.5 F (36.9 C) (Oral)  Resp 15  Ht 4\' 11"  (1.499 m)  Wt 105 lb (47.628 kg)  BMI 21.20 kg/m2  SpO2 97% Physical Exam  Nursing note and vitals reviewed. Constitutional: She is oriented to person, place, and time. She appears well-developed and well-nourished. No distress.  HENT:  Head: Normocephalic and atraumatic.  Eyes: Conjunctivae and EOM are normal.  Neck: Neck supple. No tracheal deviation present.  Cardiovascular: Normal rate and regular rhythm.   No murmur heard. Pulmonary/Chest:  Effort normal and breath sounds normal. No respiratory distress. She has no wheezes. She has no rales.  Abdominal: Soft. Bowel sounds are normal. She exhibits no distension. There is no tenderness. There is no rebound and no guarding.  Small reducible umbilical hernia   Musculoskeletal: Normal range of motion.  Neurological: She is alert and oriented to person, place, and time.  Skin: Skin is warm and dry.  Psychiatric: She has a normal mood and affect. Her behavior is normal.    ED Course  Procedures (including critical care time) COORDINATION OF CARE:  Nursing notes reviewed. Vital signs reviewed. Initial pt interview and examination performed.   Filed Vitals:   07/21/13 1333  BP: 136/88  Pulse: 69  Temp: 98.5 F (36.9 C)  TempSrc: Oral  Resp: 15  Height: 4\' 11"  (1.499 m)  Weight: 105 lb (47.628 kg)  SpO2: 97%    3:04 PM-Discussed work up plan with pt at bedside, which includes No orders of the defined types were placed in this encounter.  . Pt agrees with plan.   Treatment plan initiated:Medications - No data to display   Initial diagnostic testing ordered.       Labs Review Labs Reviewed - No data to display  Imaging Review No results found.   EKG Interpretation None      MDM   Final diagnoses:  None   Patient is a 48 year old female presenting today to request a pregnancy test.  Her LMP was 17 years ago.  Patient has been evaluated in the ED for this several times recently, including earlier this morning.  Patient had negative Serum Pregnancy test four days ago.  She does not have any abdominal pain at this time.  Therefore, do not feel that any imaging is indicated.  Patient is afebrile.  Feel that the patient is stable for discharge.  Return precautions given.   Hyman Bible, PA-C 07/23/13 Joseph, PA-C 07/23/13 2303

## 2013-07-21 NOTE — ED Notes (Signed)
MD at bedside. 

## 2013-07-21 NOTE — ED Notes (Signed)
CT called by Secretary. Pt is finished with contrast.

## 2013-07-21 NOTE — ED Notes (Signed)
Pt. Walked out of Room A2 dressed. Stating to Dr. Canary Brim that she does not need anything and left the room with out further treatment.

## 2013-07-24 NOTE — ED Provider Notes (Signed)
Medical screening examination/treatment/procedure(s) were performed by non-physician practitioner and as supervising physician I was immediately available for consultation/collaboration.   EKG Interpretation None       Threasa Beards, MD 07/24/13 [phone]

## 2013-07-29 ENCOUNTER — Ambulatory Visit: Payer: Medicaid Other | Admitting: Internal Medicine

## 2013-08-19 ENCOUNTER — Encounter (HOSPITAL_COMMUNITY): Payer: Self-pay | Admitting: Emergency Medicine

## 2013-08-19 ENCOUNTER — Emergency Department (HOSPITAL_COMMUNITY)
Admission: EM | Admit: 2013-08-19 | Discharge: 2013-08-20 | Disposition: A | Discharge status: Home / Self Care | Payer: Medicaid Other | Attending: Emergency Medicine | Admitting: Emergency Medicine

## 2013-08-19 DIAGNOSIS — F329 Major depressive disorder, single episode, unspecified: Secondary | ICD-10-CM | POA: Insufficient documentation

## 2013-08-19 DIAGNOSIS — Z8619 Personal history of other infectious and parasitic diseases: Secondary | ICD-10-CM | POA: Insufficient documentation

## 2013-08-19 DIAGNOSIS — I10 Essential (primary) hypertension: Secondary | ICD-10-CM | POA: Insufficient documentation

## 2013-08-19 DIAGNOSIS — Z3202 Encounter for pregnancy test, result negative: Secondary | ICD-10-CM | POA: Insufficient documentation

## 2013-08-19 DIAGNOSIS — W57XXXA Bitten or stung by nonvenomous insect and other nonvenomous arthropods, initial encounter: Secondary | ICD-10-CM

## 2013-08-19 DIAGNOSIS — Z8669 Personal history of other diseases of the nervous system and sense organs: Secondary | ICD-10-CM | POA: Insufficient documentation

## 2013-08-19 DIAGNOSIS — N909 Noninflammatory disorder of vulva and perineum, unspecified: Secondary | ICD-10-CM

## 2013-08-19 DIAGNOSIS — Z9889 Other specified postprocedural states: Secondary | ICD-10-CM | POA: Insufficient documentation

## 2013-08-19 DIAGNOSIS — Z8781 Personal history of (healed) traumatic fracture: Secondary | ICD-10-CM | POA: Insufficient documentation

## 2013-08-19 DIAGNOSIS — E119 Type 2 diabetes mellitus without complications: Secondary | ICD-10-CM | POA: Insufficient documentation

## 2013-08-19 DIAGNOSIS — Z8719 Personal history of other diseases of the digestive system: Secondary | ICD-10-CM | POA: Insufficient documentation

## 2013-08-19 DIAGNOSIS — Y929 Unspecified place or not applicable: Secondary | ICD-10-CM | POA: Insufficient documentation

## 2013-08-19 DIAGNOSIS — F411 Generalized anxiety disorder: Secondary | ICD-10-CM | POA: Insufficient documentation

## 2013-08-19 DIAGNOSIS — S90569A Insect bite (nonvenomous), unspecified ankle, initial encounter: Secondary | ICD-10-CM | POA: Insufficient documentation

## 2013-08-19 DIAGNOSIS — F17200 Nicotine dependence, unspecified, uncomplicated: Secondary | ICD-10-CM | POA: Insufficient documentation

## 2013-08-19 DIAGNOSIS — Z79899 Other long term (current) drug therapy: Secondary | ICD-10-CM | POA: Insufficient documentation

## 2013-08-19 DIAGNOSIS — Z8611 Personal history of tuberculosis: Secondary | ICD-10-CM | POA: Insufficient documentation

## 2013-08-19 DIAGNOSIS — Z8739 Personal history of other diseases of the musculoskeletal system and connective tissue: Secondary | ICD-10-CM | POA: Insufficient documentation

## 2013-08-19 DIAGNOSIS — F3289 Other specified depressive episodes: Secondary | ICD-10-CM | POA: Insufficient documentation

## 2013-08-19 DIAGNOSIS — N9089 Other specified noninflammatory disorders of vulva and perineum: Secondary | ICD-10-CM | POA: Insufficient documentation

## 2013-08-19 DIAGNOSIS — Y939 Activity, unspecified: Secondary | ICD-10-CM | POA: Insufficient documentation

## 2013-08-19 DIAGNOSIS — Z21 Asymptomatic human immunodeficiency virus [HIV] infection status: Secondary | ICD-10-CM | POA: Insufficient documentation

## 2013-08-19 LAB — COMPREHENSIVE METABOLIC PANEL
ALBUMIN: 4 g/dL (ref 3.5–5.2)
ALK PHOS: 61 U/L (ref 39–117)
ALT: 44 U/L — ABNORMAL HIGH (ref 0–35)
AST: 54 U/L — AB (ref 0–37)
BUN: 13 mg/dL (ref 6–23)
CALCIUM: 9.6 mg/dL (ref 8.4–10.5)
CO2: 24 mEq/L (ref 19–32)
Chloride: 108 mEq/L (ref 96–112)
Creatinine, Ser: 1.12 mg/dL — ABNORMAL HIGH (ref 0.50–1.10)
GFR calc Af Amer: 66 mL/min — ABNORMAL LOW (ref >90)
GFR calc non Af Amer: 57 mL/min — ABNORMAL LOW (ref >90)
GLUCOSE: 94 mg/dL (ref 70–99)
POTASSIUM: 3.7 meq/L (ref 3.7–5.3)
SODIUM: 145 meq/L (ref 137–147)
TOTAL PROTEIN: 7.5 g/dL (ref 6.0–8.3)
Total Bilirubin: 0.3 mg/dL (ref 0.3–1.2)

## 2013-08-19 LAB — CBC WITH DIFFERENTIAL/PLATELET
BASOS PCT: 0 % (ref 0–1)
Basophils Absolute: 0 10*3/uL (ref 0.0–0.1)
Eosinophils Absolute: 0.1 10*3/uL (ref 0.0–0.7)
Eosinophils Relative: 2 % (ref 0–5)
HCT: 32.4 % — ABNORMAL LOW (ref 36.0–46.0)
HEMOGLOBIN: 11.4 g/dL — AB (ref 12.0–15.0)
LYMPHS PCT: 52 % — AB (ref 12–46)
Lymphs Abs: 1.3 10*3/uL (ref 0.7–4.0)
MCH: 31.1 pg (ref 26.0–34.0)
MCHC: 35.2 g/dL (ref 30.0–36.0)
MCV: 88.3 fL (ref 78.0–100.0)
Monocytes Absolute: 0.4 10*3/uL (ref 0.1–1.0)
Monocytes Relative: 15 % — ABNORMAL HIGH (ref 3–12)
Neutro Abs: 0.8 10*3/uL — ABNORMAL LOW (ref 1.7–7.7)
Neutrophils Relative %: 31 % — ABNORMAL LOW (ref 43–77)
PLATELETS: 117 10*3/uL — AB (ref 150–400)
RBC: 3.67 MIL/uL — AB (ref 3.87–5.11)
RDW: 13.5 % (ref 11.5–15.5)
WBC: 2.6 10*3/uL — ABNORMAL LOW (ref 4.0–10.5)

## 2013-08-19 LAB — POC URINE PREG, ED: Preg Test, Ur: NEGATIVE

## 2013-08-19 LAB — LIPASE, BLOOD: Lipase: 62 U/L — ABNORMAL HIGH (ref 11–59)

## 2013-08-19 NOTE — ED Notes (Signed)
Pt states she has been having right upper flank pain for three weeks, has some vaginal itching she thinks is related to a different type of soap she was using and has multiple bug bites.

## 2013-08-20 LAB — URINE MICROSCOPIC-ADD ON

## 2013-08-20 LAB — URINALYSIS, ROUTINE W REFLEX MICROSCOPIC
Bilirubin Urine: NEGATIVE
GLUCOSE, UA: NEGATIVE mg/dL
HGB URINE DIPSTICK: NEGATIVE
Ketones, ur: NEGATIVE mg/dL
LEUKOCYTES UA: NEGATIVE
Nitrite: NEGATIVE
PH: 6.5 (ref 5.0–8.0)
PROTEIN: 30 mg/dL — AB
SPECIFIC GRAVITY, URINE: 1.027 (ref 1.005–1.030)
Urobilinogen, UA: 1 mg/dL (ref 0.0–1.0)

## 2013-08-20 MED ORDER — DIPHENHYDRAMINE HCL 25 MG PO CAPS
25.0000 mg | ORAL_CAPSULE | Freq: Once | ORAL | Status: AC
  Administered 2013-08-20: 25 mg via ORAL
  Filled 2013-08-20: qty 1

## 2013-08-20 MED ORDER — HYDROCORTISONE 1 % EX OINT
TOPICAL_OINTMENT | Freq: Two times a day (BID) | CUTANEOUS | Status: DC
Start: 2013-08-20 — End: 2013-08-20
  Administered 2013-08-20: 1 via TOPICAL
  Filled 2013-08-20: qty 28.35

## 2013-08-20 MED ORDER — DIPHENHYDRAMINE HCL 25 MG PO CAPS: 25.0000 mg | ORAL_CAPSULE | Freq: Four times a day (QID) | ORAL | Status: DC | PRN

## 2013-08-20 NOTE — ED Notes (Signed)
Pt ambulating independently w/ steady gait on d/c in no acute distress, A&Ox4. D/c instructions reviewed w/ pt and family - pt and family deny any further questions or concerns at present. Rx given x1  

## 2013-08-20 NOTE — Discharge Instructions (Signed)
Stop using the soap  Please use the ointment supplied tonight 2 times a day for the next 3 days than resume normal washing with a mild soap like ivory

## 2013-08-20 NOTE — ED Provider Notes (Signed)
Medical screening examination/treatment/procedure(s) were performed by non-physician practitioner and as supervising physician I was immediately available for consultation/collaboration.   EKG Interpretation None        Osvaldo Shipper, MD 08/20/13 [phone]

## 2013-08-20 NOTE — ED Provider Notes (Signed)
CSN: [id number]     Arrival date & time 08/19/13  2200 History   First MD Initiated Contact with Patient 08/20/13 0028     Chief Complaint  Patient presents with  . Flank Pain  . Vaginal Itching     (Consider location/radiation/quality/duration/timing/severity/associated sxs/prior Treatment) HPI Comments: Patient has had bug bite on R scapula area for the past 3 weeks that is gradually getting better but remains slightly tender. She also has several bug bites on legs that are itchy and has been using "the motel soap" and is irritated in her perineum for the past several days   Patient is a 48 y.o. female presenting with flank pain and vaginal itching. The history is provided by the patient.  Flank Pain This is a new problem. The current episode started 1 to 4 weeks ago. The problem occurs constantly. The problem has been gradually improving. Associated symptoms include a rash. Pertinent negatives include no abdominal pain, coughing, fatigue, fever, headaches or vomiting. Nothing aggravates the symptoms. She has tried nothing for the symptoms. The treatment provided no relief.  Vaginal Itching Associated symptoms include a rash. Pertinent negatives include no abdominal pain, coughing, fatigue, fever, headaches or vomiting.    Past Medical History  Diagnosis Date  . HIV (human immunodeficiency virus infection)   . Hepatitis C   . Diabetes mellitus   . Anxiety   . Pinched nerve   . Scoliosis   . Hernia   . Ankle fracture   . Hypertension   . TB (tuberculosis)   . Depression    Past Surgical History  Procedure Laterality Date  . Cholecystectomy    . Cesarean section    . Hernia repair    . Liver biopsy     Family History  Problem Relation Age of Onset  . Diabetes Mother   . Hypertension Mother    History  Substance Use Topics  . Smoking status: Current Every Day Smoker -- 1.00 packs/day    Types: Cigarettes  . Smokeless tobacco: Never Used  . Alcohol Use: No   OB  History   Grav Para Term Preterm Abortions TAB SAB Ect Mult Living   2 2        2      Review of Systems  Constitutional: Negative for fever and fatigue.  Respiratory: Negative for cough.   Gastrointestinal: Negative for vomiting and abdominal pain.  Genitourinary: Negative for dysuria, flank pain, decreased urine volume, vaginal bleeding, vaginal discharge and vaginal pain.       Perineal irritation   Skin: Positive for rash.  Neurological: Negative for dizziness and headaches.  All other systems reviewed and are negative.     Allergies  Review of patient's allergies indicates no known allergies.  Home Medications   Prior to Admission medications   Medication Sig Start Date End Date Taking? Authorizing Provider  amLODipine (NORVASC) 10 MG tablet Take 10 mg by mouth daily.   Yes Historical Provider, MD  cetirizine (ZYRTEC) 10 MG tablet Take 10 mg by mouth daily.   Yes Historical Provider, MD  cloNIDine (CATAPRES) 0.2 MG tablet Take 0.2 mg by mouth 3 (three) times daily.    Yes Historical Provider, MD  emtricitabine-tenofovir (TRUVADA) 200-300 MG per tablet Take 1 tablet by mouth daily.   Yes Historical Provider, MD  FLUoxetine (PROZAC) 40 MG capsule Take 40 mg by mouth daily.   Yes Historical Provider, MD  hydrochlorothiazide (HYDRODIURIL) 25 MG tablet Take 25 mg by mouth daily.  Yes Historical Provider, MD  HYDROcodone-acetaminophen (NORCO) 7.5-325 MG per tablet Take 1 tablet by mouth every 6 (six) hours as needed for moderate pain.   Yes Historical Provider, MD  raltegravir (ISENTRESS) 400 MG tablet Take 400 mg by mouth daily.   Yes Historical Provider, MD  trazodone (DESYREL) 300 MG tablet Take 300 mg by mouth at bedtime.   Yes Historical Provider, MD  diphenhydrAMINE (BENADRYL) 25 mg capsule Take 1 capsule (25 mg total) by mouth every 6 (six) hours as needed. 08/20/13   Garald Balding, NP   BP 151/87  Pulse 99  Temp(Src) 99.5 F (37.5 C) (Oral)  Resp 18  SpO2 97% Physical  Exam  Nursing note and vitals reviewed. Constitutional: She appears well-developed and well-nourished.  HENT:  Head: Normocephalic.  Eyes: Pupils are equal, round, and reactive to light.  Neck: Normal range of motion.  Cardiovascular: Normal rate and regular rhythm.   Pulmonary/Chest: Effort normal.  Abdominal: Soft.  Genitourinary: No vaginal discharge found.  Slight redness of labia   Skin:  Several bug bites to legs with out cellulitis  Bite on back healing no erythremia, flatulence, discharge     ED Course  Procedures (including critical care time) Labs Review Labs Reviewed  CBC WITH DIFFERENTIAL - Abnormal; Notable for the following:    WBC 2.6 (*)    RBC 3.67 (*)    Hemoglobin 11.4 (*)    HCT 32.4 (*)    Platelets 117 (*)    Neutrophils Relative % 31 (*)    Lymphocytes Relative 52 (*)    Monocytes Relative 15 (*)    Neutro Abs 0.8 (*)    All other components within normal limits  COMPREHENSIVE METABOLIC PANEL - Abnormal; Notable for the following:    Creatinine, Ser 1.12 (*)    AST 54 (*)    ALT 44 (*)    GFR calc non Af Amer 57 (*)    GFR calc Af Amer 66 (*)    All other components within normal limits  LIPASE, BLOOD - Abnormal; Notable for the following:    Lipase 62 (*)    All other components within normal limits  URINALYSIS, ROUTINE W REFLEX MICROSCOPIC - Abnormal; Notable for the following:    APPearance CLOUDY (*)    Protein, ur 30 (*)    All other components within normal limits  URINE MICROSCOPIC-ADD ON - Abnormal; Notable for the following:    Crystals CA OXALATE CRYSTALS (*)    All other components within normal limits  PREGNANCY, URINE  POC URINE PREG, ED    Imaging Review No results found.   EKG Interpretation None      MDM  Patient in no distress  Will give patient the steroid ointment and Rx for Benadryl Final diagnoses:  Bug bites  Irritation of external female genitalia         Garald Balding, NP 08/20/13 0106

## 2013-09-18 ENCOUNTER — Emergency Department (HOSPITAL_COMMUNITY)
Admission: EM | Admit: 2013-09-18 | Discharge: 2013-09-18 | Disposition: A | Discharge status: Home / Self Care | Payer: Medicaid Other | Attending: Emergency Medicine | Admitting: Emergency Medicine

## 2013-09-18 ENCOUNTER — Encounter (HOSPITAL_COMMUNITY): Payer: Self-pay | Admitting: Emergency Medicine

## 2013-09-18 ENCOUNTER — Emergency Department (HOSPITAL_COMMUNITY): Payer: Medicaid Other

## 2013-09-18 DIAGNOSIS — Z79899 Other long term (current) drug therapy: Secondary | ICD-10-CM | POA: Diagnosis not present

## 2013-09-18 DIAGNOSIS — Z8719 Personal history of other diseases of the digestive system: Secondary | ICD-10-CM | POA: Diagnosis not present

## 2013-09-18 DIAGNOSIS — Z8781 Personal history of (healed) traumatic fracture: Secondary | ICD-10-CM | POA: Diagnosis not present

## 2013-09-18 DIAGNOSIS — S46909A Unspecified injury of unspecified muscle, fascia and tendon at shoulder and upper arm level, unspecified arm, initial encounter: Secondary | ICD-10-CM | POA: Insufficient documentation

## 2013-09-18 DIAGNOSIS — S59909A Unspecified injury of unspecified elbow, initial encounter: Secondary | ICD-10-CM | POA: Insufficient documentation

## 2013-09-18 DIAGNOSIS — S59919A Unspecified injury of unspecified forearm, initial encounter: Secondary | ICD-10-CM

## 2013-09-18 DIAGNOSIS — Z8611 Personal history of tuberculosis: Secondary | ICD-10-CM | POA: Insufficient documentation

## 2013-09-18 DIAGNOSIS — E119 Type 2 diabetes mellitus without complications: Secondary | ICD-10-CM | POA: Diagnosis not present

## 2013-09-18 DIAGNOSIS — Z8619 Personal history of other infectious and parasitic diseases: Secondary | ICD-10-CM | POA: Diagnosis not present

## 2013-09-18 DIAGNOSIS — F411 Generalized anxiety disorder: Secondary | ICD-10-CM | POA: Insufficient documentation

## 2013-09-18 DIAGNOSIS — Z8739 Personal history of other diseases of the musculoskeletal system and connective tissue: Secondary | ICD-10-CM | POA: Insufficient documentation

## 2013-09-18 DIAGNOSIS — S4980XA Other specified injuries of shoulder and upper arm, unspecified arm, initial encounter: Secondary | ICD-10-CM | POA: Insufficient documentation

## 2013-09-18 DIAGNOSIS — S298XXA Other specified injuries of thorax, initial encounter: Secondary | ICD-10-CM | POA: Diagnosis not present

## 2013-09-18 DIAGNOSIS — F17200 Nicotine dependence, unspecified, uncomplicated: Secondary | ICD-10-CM | POA: Insufficient documentation

## 2013-09-18 DIAGNOSIS — Z21 Asymptomatic human immunodeficiency virus [HIV] infection status: Secondary | ICD-10-CM | POA: Diagnosis not present

## 2013-09-18 DIAGNOSIS — I10 Essential (primary) hypertension: Secondary | ICD-10-CM | POA: Diagnosis not present

## 2013-09-18 DIAGNOSIS — S6990XA Unspecified injury of unspecified wrist, hand and finger(s), initial encounter: Secondary | ICD-10-CM

## 2013-09-18 MED ORDER — IBUPROFEN 800 MG PO TABS
800.0000 mg | ORAL_TABLET | Freq: Once | ORAL | Status: AC
  Administered 2013-09-18: 800 mg via ORAL
  Filled 2013-09-18: qty 1
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
## 2013-09-18 NOTE — ED Provider Notes (Signed)
Medical screening examination/treatment/procedure(s) were performed by non-physician practitioner and as supervising physician I was immediately available for consultation/collaboration.   EKG Interpretation None       Kalman Drape, MD 09/18/13 [phone]

## 2013-10-14 ENCOUNTER — Inpatient Hospital Stay: Payer: Self-pay | Admitting: Internal Medicine

## 2013-10-14 LAB — COMPREHENSIVE METABOLIC PANEL
Albumin: 3.6 g/dL (ref 3.4–5.0)
Alkaline Phosphatase: 88 U/L
Anion Gap: 11 (ref 7–16)
BILIRUBIN TOTAL: 0.9 mg/dL (ref 0.2–1.0)
BUN: 20 mg/dL — ABNORMAL HIGH (ref 7–18)
CREATININE: 1.65 mg/dL — AB (ref 0.60–1.30)
Calcium, Total: 8.6 mg/dL (ref 8.5–10.1)
Chloride: 100 mmol/L (ref 98–107)
Co2: 26 mmol/L (ref 21–32)
EGFR (African American): 42 — ABNORMAL LOW
EGFR (Non-African Amer.): 36 — ABNORMAL LOW
GLUCOSE: 108 mg/dL — AB (ref 65–99)
Osmolality: 277 (ref 275–301)
Potassium: 3.8 mmol/L (ref 3.5–5.1)
SGOT(AST): 74 U/L — ABNORMAL HIGH (ref 15–37)
SGPT (ALT): 78 U/L — ABNORMAL HIGH
Sodium: 137 mmol/L (ref 136–145)
TOTAL PROTEIN: 8.6 g/dL — AB (ref 6.4–8.2)

## 2013-10-14 LAB — CBC WITH DIFFERENTIAL/PLATELET
BASOS ABS: 0 10*3/uL (ref 0.0–0.1)
Basophil %: 0.3 %
EOS ABS: 0 10*3/uL (ref 0.0–0.7)
EOS PCT: 0.2 %
HCT: 40.5 % (ref 35.0–47.0)
HGB: 13.5 g/dL (ref 12.0–16.0)
Lymphocyte #: 1.1 10*3/uL (ref 1.0–3.6)
Lymphocyte %: 6.5 %
MCH: 31.4 pg (ref 26.0–34.0)
MCHC: 33.3 g/dL (ref 32.0–36.0)
MCV: 94 fL (ref 80–100)
MONO ABS: 1.1 x10 3/mm — AB (ref 0.2–0.9)
Monocyte %: 6.1 %
Neutrophil #: 15.1 10*3/uL — ABNORMAL HIGH (ref 1.4–6.5)
Neutrophil %: 86.9 %
Platelet: 119 10*3/uL — ABNORMAL LOW (ref 150–440)
RBC: 4.29 10*6/uL (ref 3.80–5.20)
RDW: 14.2 % (ref 11.5–14.5)
WBC: 17.4 10*3/uL — AB (ref 3.6–11.0)

## 2013-10-14 LAB — URINALYSIS, COMPLETE
BILIRUBIN, UR: NEGATIVE
Bacteria: NONE SEEN
Glucose,UR: NEGATIVE mg/dL (ref 0–75)
Ketone: NEGATIVE
NITRITE: NEGATIVE
PH: 6 (ref 4.5–8.0)
RBC,UR: 5 /HPF (ref 0–5)
SPECIFIC GRAVITY: 1.013 (ref 1.003–1.030)
Squamous Epithelial: 9

## 2013-10-14 LAB — TROPONIN I: Troponin-I: <0.02 ng/mL

## 2013-10-14 LAB — LIPASE, BLOOD: Lipase: 168 U/L (ref 73–393)

## 2013-10-15 LAB — CBC WITH DIFFERENTIAL/PLATELET
Basophil #: 0 10*3/uL (ref 0.0–0.1)
Basophil %: 0.1 %
EOS ABS: 0 10*3/uL (ref 0.0–0.7)
Eosinophil %: 0.1 %
HCT: 34.3 % — ABNORMAL LOW (ref 35.0–47.0)
HGB: 11.2 g/dL — ABNORMAL LOW (ref 12.0–16.0)
LYMPHS ABS: 1 10*3/uL (ref 1.0–3.6)
Lymphocyte %: 8.6 %
MCH: 31 pg (ref 26.0–34.0)
MCHC: 32.5 g/dL (ref 32.0–36.0)
MCV: 95 fL (ref 80–100)
Monocyte #: 1 x10 3/mm — ABNORMAL HIGH (ref 0.2–0.9)
Monocyte %: 8 %
NEUTROS ABS: 9.9 10*3/uL — AB (ref 1.4–6.5)
Neutrophil %: 83.2 %
Platelet: 87 10*3/uL — ABNORMAL LOW (ref 150–440)
RBC: 3.6 10*6/uL — AB (ref 3.80–5.20)
RDW: 14.3 % (ref 11.5–14.5)
WBC: 11.9 10*3/uL — AB (ref 3.6–11.0)

## 2013-10-15 LAB — BASIC METABOLIC PANEL
ANION GAP: 8 (ref 7–16)
BUN: 19 mg/dL — ABNORMAL HIGH (ref 7–18)
CALCIUM: 7.5 mg/dL — AB (ref 8.5–10.1)
CHLORIDE: 107 mmol/L (ref 98–107)
CO2: 25 mmol/L (ref 21–32)
CREATININE: 1.32 mg/dL — AB (ref 0.60–1.30)
EGFR (African American): 54 — ABNORMAL LOW
GFR CALC NON AF AMER: 47 — AB
Glucose: 98 mg/dL (ref 65–99)
OSMOLALITY: 282 (ref 275–301)
POTASSIUM: 3.5 mmol/L (ref 3.5–5.1)
Sodium: 140 mmol/L (ref 136–145)

## 2013-10-16 LAB — CBC WITH DIFFERENTIAL/PLATELET
BASOS ABS: 0 10*3/uL (ref 0.0–0.1)
BASOS PCT: 0.3 %
EOS ABS: 0 10*3/uL (ref 0.0–0.7)
EOS PCT: 0.6 %
HCT: 31.9 % — ABNORMAL LOW (ref 35.0–47.0)
HGB: 10.9 g/dL — AB (ref 12.0–16.0)
LYMPHS ABS: 0.9 10*3/uL — AB (ref 1.0–3.6)
LYMPHS PCT: 11.9 %
MCH: 32.2 pg (ref 26.0–34.0)
MCHC: 34.1 g/dL (ref 32.0–36.0)
MCV: 94 fL (ref 80–100)
MONO ABS: 0.8 x10 3/mm (ref 0.2–0.9)
Monocyte %: 10.8 %
Neutrophil #: 5.8 10*3/uL (ref 1.4–6.5)
Neutrophil %: 76.4 %
Platelet: 78 10*3/uL — ABNORMAL LOW (ref 150–440)
RBC: 3.38 10*6/uL — AB (ref 3.80–5.20)
RDW: 14.4 % (ref 11.5–14.5)
WBC: 7.7 10*3/uL (ref 3.6–11.0)

## 2013-10-16 LAB — BASIC METABOLIC PANEL
ANION GAP: 10 (ref 7–16)
BUN: 8 mg/dL (ref 7–18)
CALCIUM: 7.9 mg/dL — AB (ref 8.5–10.1)
CHLORIDE: 109 mmol/L — AB (ref 98–107)
CO2: 21 mmol/L (ref 21–32)
Creatinine: 0.89 mg/dL (ref 0.60–1.30)
EGFR (African American): >60
EGFR (Non-African Amer.): >60
Glucose: 90 mg/dL (ref 65–99)
Osmolality: 277 (ref 275–301)
Potassium: 3.7 mmol/L (ref 3.5–5.1)
Sodium: 140 mmol/L (ref 136–145)

## 2013-10-16 LAB — URINE CULTURE

## 2013-10-19 LAB — CULTURE, BLOOD (SINGLE)

## 2013-11-17 IMAGING — CR DG ANKLE PORT 2V*R*
2 series · 2 of 2 positions shown · non-contrast
Comparison: 03/24/2011

CLINICAL DATA: Closed reduction

PORTABLE RIGHT ANKLE - 2 VIEW

[view not recorded (1 of 2)]
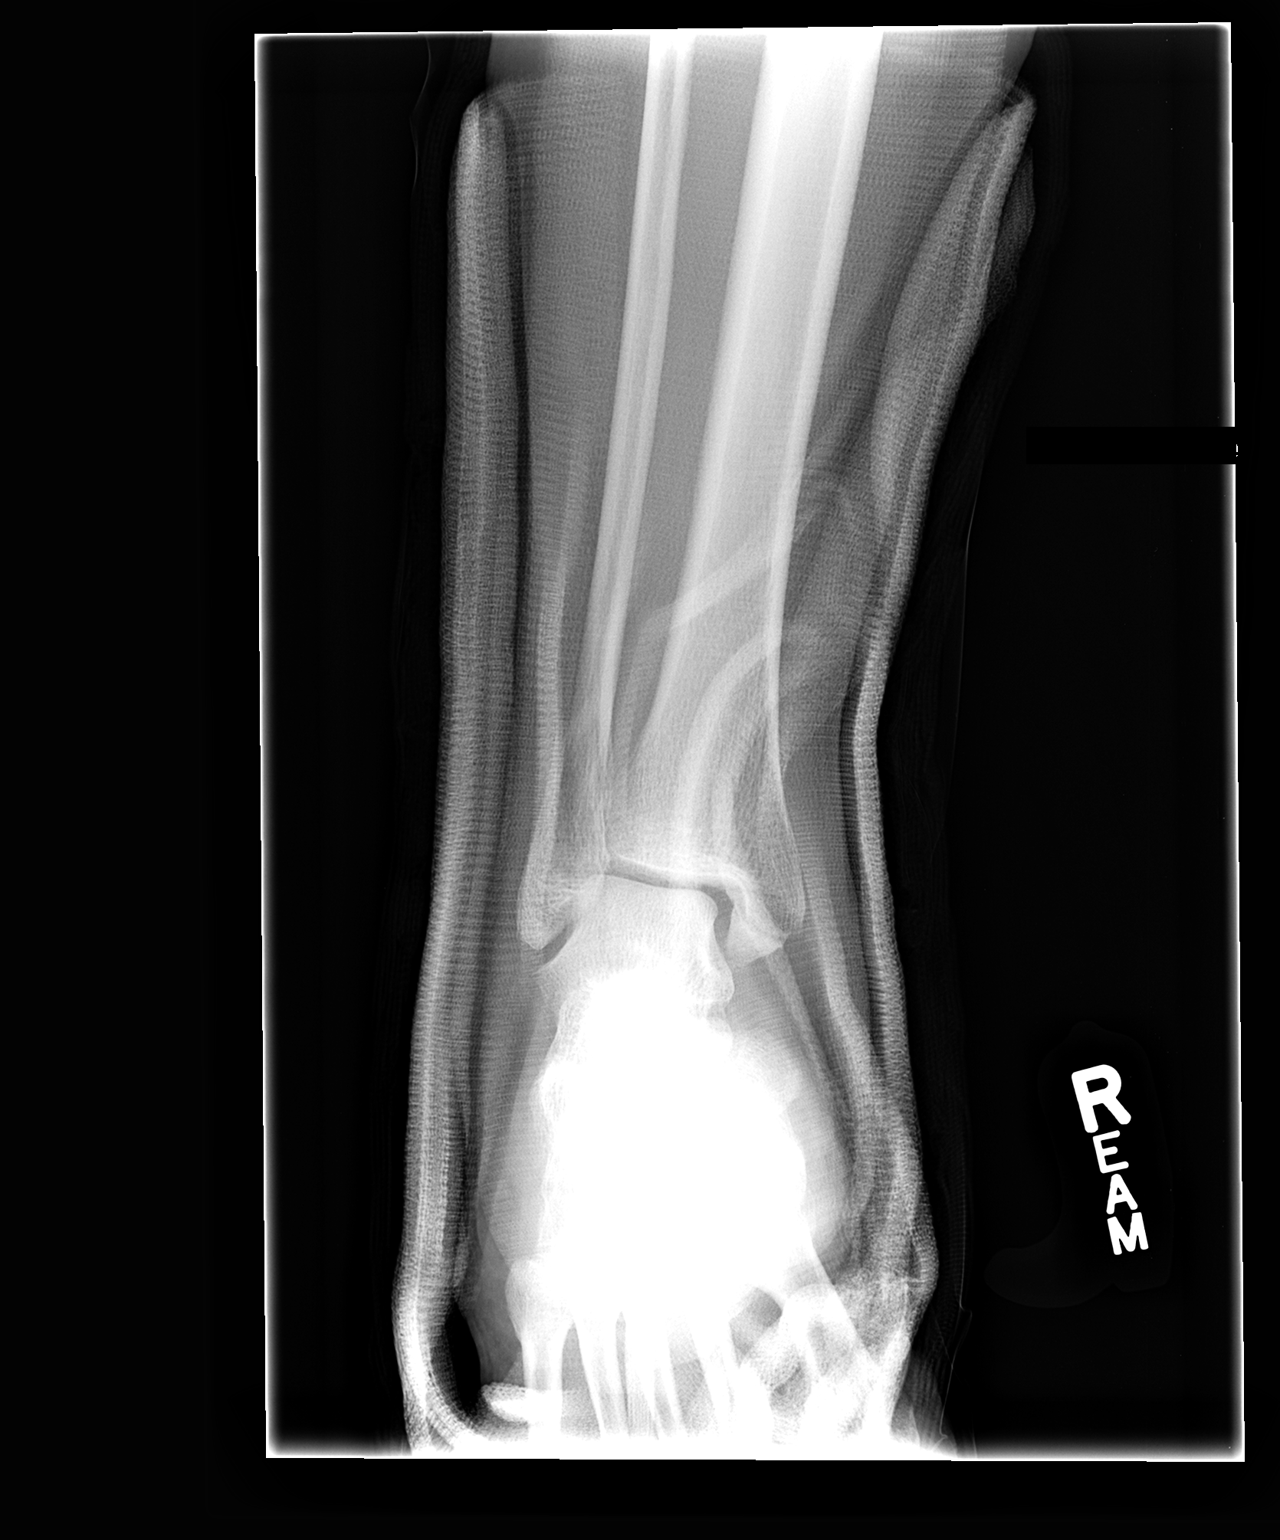

[view not recorded (2 of 2)]
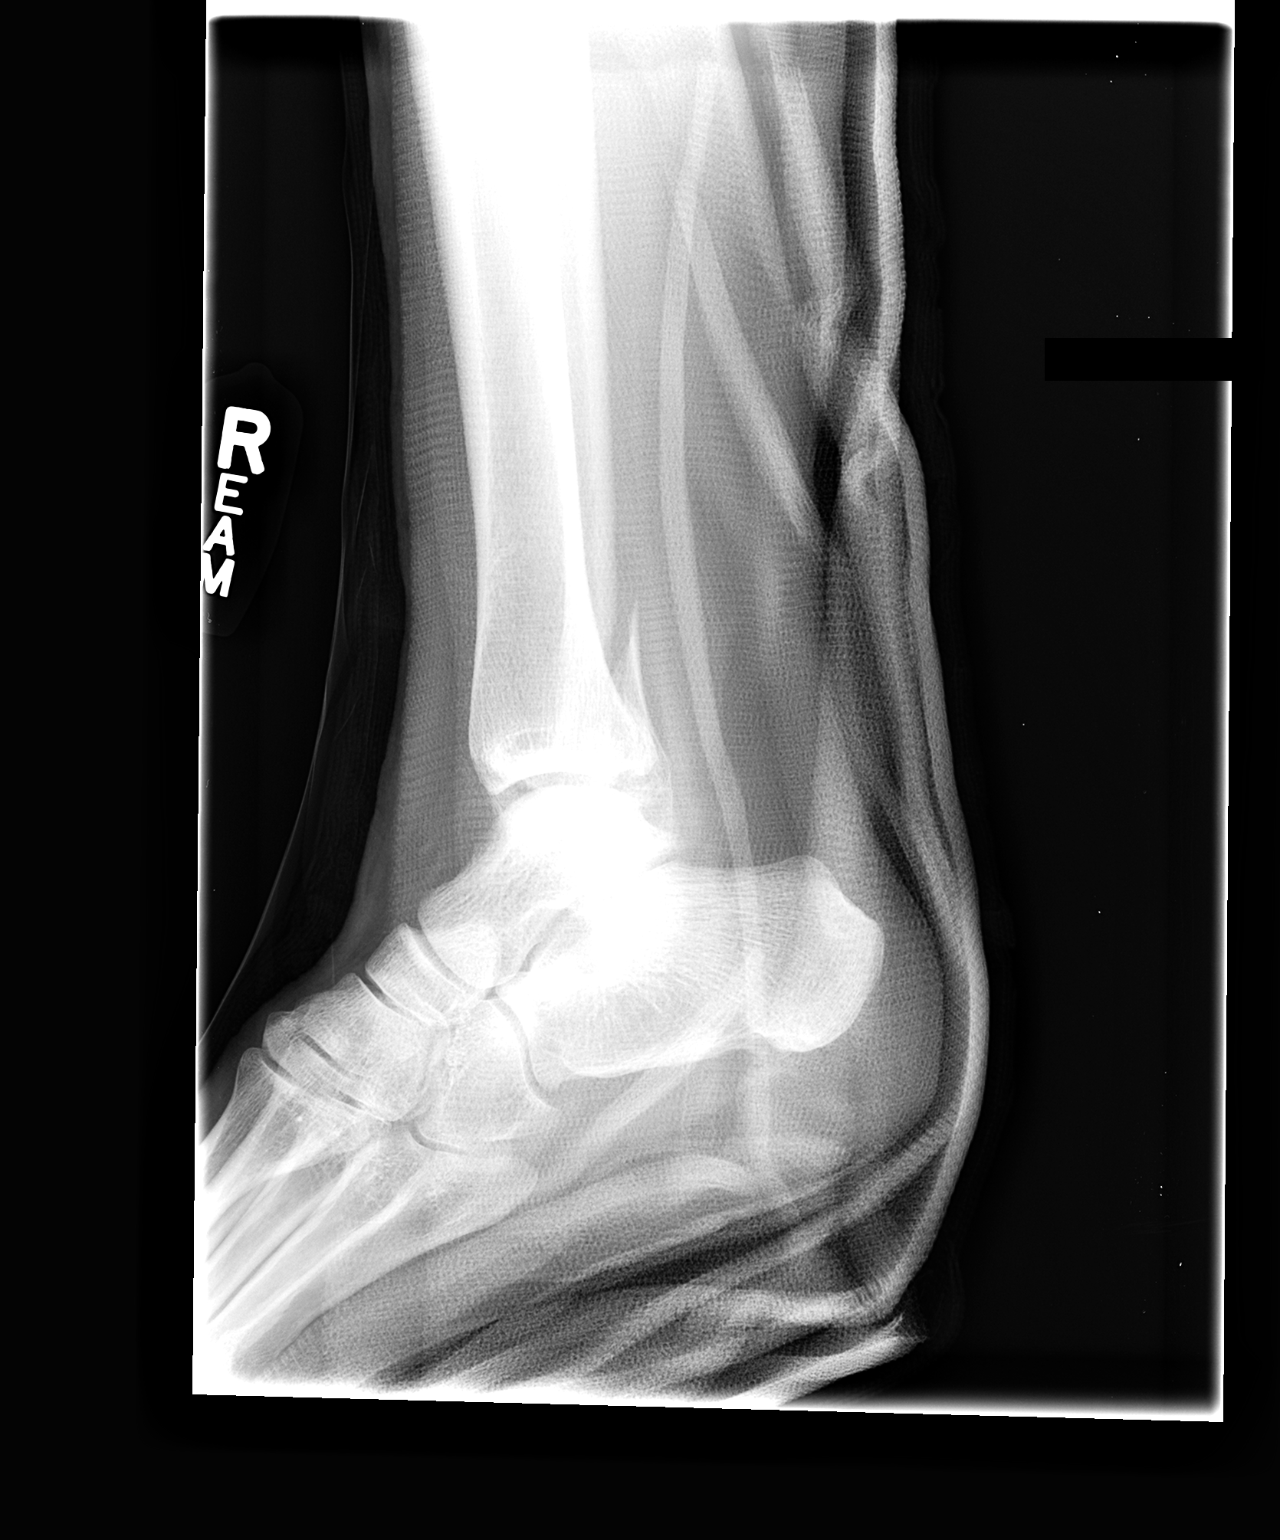

[2 of 2 positions shown; findings below may reference images not displayed]

FINDINGS: Interval closed reduction of the right ankle. Overlying
bandage material obscures fine bone detail.  Again noted is a
fracture involving the medial malleolus and distal fibula.  There
has been interval reduction of the tibiotalar joint.
IMPRESSION: 1.  Interval reduction of tibiotalar joint dislocation.
2.  Bilateral malleolar fractures.

## 2013-11-17 IMAGING — CR DG CHEST 1V
1 series · 1 of 1 positions shown · non-contrast
Comparison: 11/11/2009 and 03/01/2009.

CLINICAL DATA: Ankle fracture.

CHEST - 1 VIEW

[view not recorded]
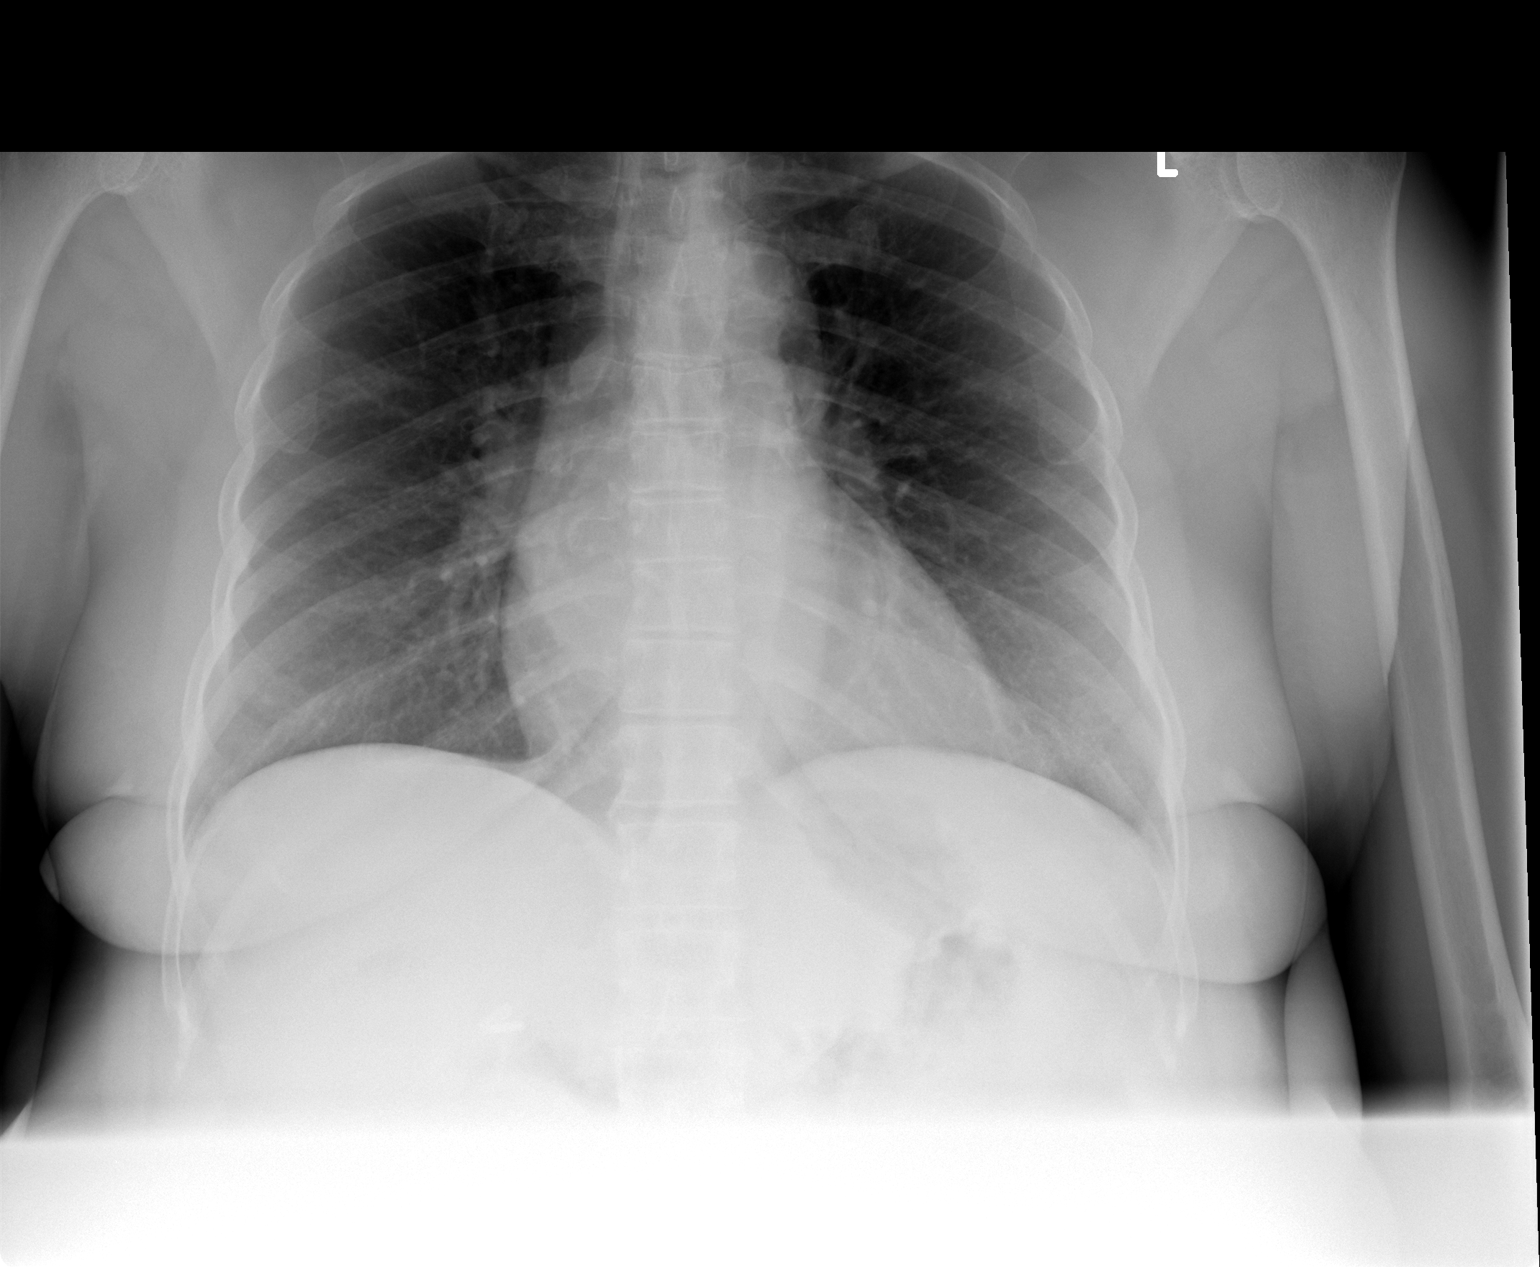

[1 of 1 positions shown; findings below may reference images not displayed]

FINDINGS: Nodularity anterior aspect of the left first rib felt to
be related to overlapping structures and noted previously.No
infiltrate, congestive heart failure or pneumothorax..

Mildly tortuous aorta.  Heart size top normal.  Mild central
pulmonary vascular prominence.
IMPRESSION: No acute abnormality.  Please see above.

## 2013-11-26 ENCOUNTER — Encounter: Payer: Self-pay | Admitting: *Deleted

## 2013-11-26 ENCOUNTER — Telehealth: Payer: Self-pay | Admitting: *Deleted

## 2013-11-26 NOTE — Telephone Encounter (Signed)
Pt needs MD/Lab/PAP smear follow-up, detectable VL.  Will send letter.

## 2013-12-29 ENCOUNTER — Encounter (HOSPITAL_COMMUNITY): Payer: Self-pay | Admitting: Emergency Medicine

## 2014-01-20 ENCOUNTER — Other Ambulatory Visit: Payer: Medicaid Other

## 2014-01-20 DIAGNOSIS — B20 Human immunodeficiency virus [HIV] disease: Secondary | ICD-10-CM

## 2014-01-20 LAB — COMPREHENSIVE METABOLIC PANEL
ALBUMIN: 4.1 g/dL (ref 3.5–5.2)
ALK PHOS: 71 U/L (ref 39–117)
ALT: 42 U/L — ABNORMAL HIGH (ref 0–35)
AST: 49 U/L — ABNORMAL HIGH (ref 0–37)
BILIRUBIN TOTAL: 0.4 mg/dL (ref 0.2–1.2)
BUN: 14 mg/dL (ref 6–23)
CO2: 28 mEq/L (ref 19–32)
CREATININE: 0.89 mg/dL (ref 0.50–1.10)
Calcium: 9.1 mg/dL (ref 8.4–10.5)
Chloride: 106 mEq/L (ref 96–112)
Glucose, Bld: 82 mg/dL (ref 70–99)
POTASSIUM: 4.8 meq/L (ref 3.5–5.3)
Sodium: 139 mEq/L (ref 135–145)
Total Protein: 7.3 g/dL (ref 6.0–8.3)

## 2014-01-21 LAB — CBC WITH DIFFERENTIAL/PLATELET
BASOS ABS: 0.1 10*3/uL (ref 0.0–0.1)
BASOS PCT: 2 % — AB (ref 0–1)
EOS PCT: 3 % (ref 0–5)
Eosinophils Absolute: 0.1 10*3/uL (ref 0.0–0.7)
HEMATOCRIT: 35.2 % — AB (ref 36.0–46.0)
Hemoglobin: 11.7 g/dL — ABNORMAL LOW (ref 12.0–15.0)
Lymphocytes Relative: 37 % (ref 12–46)
Lymphs Abs: 1 10*3/uL (ref 0.7–4.0)
MCH: 30.9 pg (ref 26.0–34.0)
MCHC: 33.2 g/dL (ref 30.0–36.0)
MCV: 92.9 fL (ref 78.0–100.0)
MPV: 10.3 fL (ref 9.4–12.4)
Monocytes Absolute: 0.2 10*3/uL (ref 0.1–1.0)
Monocytes Relative: 7 % (ref 3–12)
Neutro Abs: 1.3 10*3/uL — ABNORMAL LOW (ref 1.7–7.7)
Neutrophils Relative %: 51 % (ref 43–77)
PLATELETS: 150 10*3/uL (ref 150–400)
RBC: 3.79 MIL/uL — ABNORMAL LOW (ref 3.87–5.11)
RDW: 14.4 % (ref 11.5–15.5)
WBC: 2.6 10*3/uL — ABNORMAL LOW (ref 4.0–10.5)

## 2014-01-21 LAB — T-HELPER CELL (CD4) - (RCID CLINIC ONLY)
CD4 T CELL HELPER: 26 % — AB (ref 33–55)
CD4 T Cell Abs: 340 /uL — ABNORMAL LOW (ref 400–2700)

## 2014-01-21 LAB — HIV-1 RNA QUANT-NO REFLEX-BLD
HIV 1 RNA Quant: 28069 copies/mL — ABNORMAL HIGH (ref <20)
HIV-1 RNA Quant, Log: 4.45 {Log} — ABNORMAL HIGH (ref <1.30)

## 2014-02-03 ENCOUNTER — Ambulatory Visit: Payer: Medicaid Other | Admitting: Internal Medicine

## 2014-02-03 ENCOUNTER — Telehealth: Payer: Self-pay | Admitting: *Deleted

## 2014-02-03 NOTE — Telephone Encounter (Signed)
Called patient at her listed home phone number. This is actually a mental health provider. THey are familiar with the patient, but did not know she was listing their phone number as her home.  They state she comes by almost daily for community and for food. He will leave her a message, but encourages [facility] to call back tomorrow to reach her. Landis Gandy, RN

## 2014-02-04 ENCOUNTER — Telehealth: Payer: Self-pay | Admitting: *Deleted

## 2014-02-04 NOTE — Telephone Encounter (Signed)
Form for personal care services faxed back to [facility] stating per Dr. Linus Salmons to send to her primary care MD, Dr. Nolene Ebbs. Ashley [name]

## 2014-03-24 ENCOUNTER — Ambulatory Visit: Payer: Medicaid Other | Admitting: Internal Medicine

## 2014-03-30 ENCOUNTER — Other Ambulatory Visit: Payer: Self-pay | Admitting: Family Medicine

## 2014-03-30 DIAGNOSIS — Z1231 Encounter for screening mammogram for malignant neoplasm of breast: Secondary | ICD-10-CM

## 2014-04-07 ENCOUNTER — Ambulatory Visit: Payer: Medicaid Other

## 2014-04-19 ENCOUNTER — Emergency Department (HOSPITAL_COMMUNITY)
Admission: EM | Admit: 2014-04-19 | Discharge: 2014-04-19 | Disposition: A | Discharge status: Home / Self Care | Payer: Medicaid Other | Attending: Emergency Medicine | Admitting: Emergency Medicine

## 2014-04-19 ENCOUNTER — Encounter (HOSPITAL_COMMUNITY): Payer: Self-pay | Admitting: *Deleted

## 2014-04-19 DIAGNOSIS — Z8719 Personal history of other diseases of the digestive system: Secondary | ICD-10-CM | POA: Insufficient documentation

## 2014-04-19 DIAGNOSIS — Z720 Tobacco use: Secondary | ICD-10-CM | POA: Insufficient documentation

## 2014-04-19 DIAGNOSIS — W540XXA Bitten by dog, initial encounter: Secondary | ICD-10-CM | POA: Diagnosis not present

## 2014-04-19 DIAGNOSIS — Z8739 Personal history of other diseases of the musculoskeletal system and connective tissue: Secondary | ICD-10-CM | POA: Diagnosis not present

## 2014-04-19 DIAGNOSIS — F329 Major depressive disorder, single episode, unspecified: Secondary | ICD-10-CM | POA: Insufficient documentation

## 2014-04-19 DIAGNOSIS — S81852A Open bite, left lower leg, initial encounter: Secondary | ICD-10-CM | POA: Diagnosis not present

## 2014-04-19 DIAGNOSIS — E119 Type 2 diabetes mellitus without complications: Secondary | ICD-10-CM | POA: Insufficient documentation

## 2014-04-19 DIAGNOSIS — Y998 Other external cause status: Secondary | ICD-10-CM | POA: Diagnosis not present

## 2014-04-19 DIAGNOSIS — Z8619 Personal history of other infectious and parasitic diseases: Secondary | ICD-10-CM | POA: Insufficient documentation

## 2014-04-19 DIAGNOSIS — Z8781 Personal history of (healed) traumatic fracture: Secondary | ICD-10-CM | POA: Insufficient documentation

## 2014-04-19 DIAGNOSIS — I10 Essential (primary) hypertension: Secondary | ICD-10-CM | POA: Insufficient documentation

## 2014-04-19 DIAGNOSIS — Y92410 Unspecified street and highway as the place of occurrence of the external cause: Secondary | ICD-10-CM | POA: Insufficient documentation

## 2014-04-19 DIAGNOSIS — Y9301 Activity, walking, marching and hiking: Secondary | ICD-10-CM | POA: Diagnosis not present

## 2014-04-19 DIAGNOSIS — F419 Anxiety disorder, unspecified: Secondary | ICD-10-CM | POA: Diagnosis not present

## 2014-04-19 DIAGNOSIS — Z79899 Other long term (current) drug therapy: Secondary | ICD-10-CM | POA: Diagnosis not present

## 2014-04-19 DIAGNOSIS — Z21 Asymptomatic human immunodeficiency virus [HIV] infection status: Secondary | ICD-10-CM | POA: Diagnosis not present

## 2014-04-19 DIAGNOSIS — S8992XA Unspecified injury of left lower leg, initial encounter: Secondary | ICD-10-CM | POA: Diagnosis present

## 2014-04-19 MED ORDER — RABIES IMMUNE GLOBULIN 150 UNIT/ML IM INJ
20.0000 [IU]/kg | INJECTION | Freq: Once | INTRAMUSCULAR | Status: AC
  Administered 2014-04-19: 1050 [IU] via INTRAMUSCULAR
  Filled 2014-04-19: qty 8

## 2014-04-19 MED ORDER — AMOXICILLIN-POT CLAVULANATE 875-125 MG PO TABS
1.0000 | ORAL_TABLET | Freq: Two times a day (BID) | ORAL | Status: DC
Start: 2014-04-19 — End: 2014-04-29

## 2014-04-19 MED ORDER — RABIES VACCINE, PCEC IM SUSR
1.0000 mL | Freq: Once | INTRAMUSCULAR | Status: AC
  Administered 2014-04-19: 1 mL via INTRAMUSCULAR
  Filled 2014-04-19: qty 1

## 2014-04-19 MED ORDER — AMOXICILLIN-POT CLAVULANATE 875-125 MG PO TABS
1.0000 | ORAL_TABLET | Freq: Once | ORAL | Status: AC
  Administered 2014-04-19: 1 via ORAL
  Filled 2014-04-19: qty 1

## 2014-04-19 NOTE — Discharge Instructions (Signed)
Take augmentin as directed until gone. Follow your rabies vaccine schedule attached. Refer to attached documents for more information.

## 2014-04-19 NOTE — ED Notes (Signed)
Dog bite to her lt leg just below her lt knee 1400 today.  Puncture wounds.  Poodle bite
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
## 2014-04-19 NOTE — ED Provider Notes (Signed)
CSN: [id number]     Arrival date & time 04/19/14  1857 History  This chart was scribed for non-physician practitioner, Alvina Chou, PA-C working with Debby Freiberg, MD by Frederich Balding, ED scribe. This patient was seen in room TR06C/TR06C and the patient's care was started at 8:26 PM.   Chief Complaint  Patient presents with  . Animal Bite   The history is provided by the patient. No language interpreter was used.    HPI Comments: Ashley [name] is a 49 y.o. female with history of HIV, diabetes, and hep C who presents to the Emergency Department complaining of a dog bite to her left lower leg that occurred at 2 PM today. Pt states someone's dog bit her while walking down the street so she is unsure of its rabies vaccination status. There are no other bites. Pt denies fever.   Past Medical History  Diagnosis Date  . HIV (human immunodeficiency virus infection)   . Hepatitis C   . Diabetes mellitus   . Anxiety   . Pinched nerve   . Scoliosis   . Hernia   . Ankle fracture   . Hypertension   . TB (tuberculosis)   . Depression    Past Surgical History  Procedure Laterality Date  . Cholecystectomy    . Cesarean section    . Hernia repair    . Liver biopsy     Family History  Problem Relation Age of Onset  . Diabetes Mother   . Hypertension Mother    History  Substance Use Topics  . Smoking status: Current Every Day Smoker -- 1.00 packs/day    Types: Cigarettes  . Smokeless tobacco: Never Used  . Alcohol Use: No   OB History    Gravida Para Term Preterm AB TAB SAB Ectopic Multiple Living   2 2        2      Review of Systems  Constitutional: Negative for fever.  Skin: Positive for wound.  All other systems reviewed and are negative.  Allergies  Review of patient's allergies indicates no known allergies.  Home Medications   Prior to Admission medications   Medication Sig Start Date End Date Taking? Authorizing Provider  amLODipine (NORVASC) 10 MG tablet  Take 10 mg by mouth daily.    Historical Provider, MD  cetirizine (ZYRTEC) 10 MG tablet Take 10 mg by mouth daily.    Historical Provider, MD  cloNIDine (CATAPRES) 0.2 MG tablet Take 0.2 mg by mouth 3 (three) times daily.     Historical Provider, MD  diphenhydrAMINE (BENADRYL) 25 mg capsule Take 1 capsule (25 mg total) by mouth every 6 (six) hours as needed. 08/20/13   Garald Balding, NP  emtricitabine-tenofovir (TRUVADA) 200-300 MG per tablet Take 1 tablet by mouth daily.    Historical Provider, MD  FLUoxetine (PROZAC) 40 MG capsule Take 40 mg by mouth daily.    Historical Provider, MD  hydrochlorothiazide (HYDRODIURIL) 25 MG tablet Take 25 mg by mouth daily.    Historical Provider, MD  HYDROcodone-acetaminophen (NORCO) 7.5-325 MG per tablet Take 1 tablet by mouth every 6 (six) hours as needed for moderate pain.    Historical Provider, MD  raltegravir (ISENTRESS) 400 MG tablet Take 400 mg by mouth daily.    Historical Provider, MD  trazodone (DESYREL) 300 MG tablet Take 300 mg by mouth at bedtime.    Historical Provider, MD   BP 143/86 mmHg  Pulse 74  Temp(Src) 98.4 F (36.9 C)  Resp 16  Ht 4\' 7"  (1.397 m)  Wt 115 lb (52.164 kg)  BMI 26.73 kg/m2  SpO2 100%   Physical Exam  Constitutional: She is oriented to person, place, and time. She appears well-developed and well-nourished. No distress.  HENT:  Head: Normocephalic and atraumatic.  Eyes: Conjunctivae and EOM are normal.  Neck: Neck supple. No tracheal deviation present.  Cardiovascular: Normal rate.   Pulmonary/Chest: Effort normal. No respiratory distress.  Abdominal: Soft. There is no tenderness.  Musculoskeletal: Normal range of motion.  Neurological: She is alert and oriented to person, place, and time.  Skin: Skin is warm and dry.  2 puncture wounds of lateral left lower leg. No bleeding at this time.   Psychiatric: She has a normal mood and affect. Her behavior is normal.  Nursing note and vitals reviewed.   ED Course   Procedures (including critical care time)  DIAGNOSTIC STUDIES: Oxygen Saturation is 100% on RA, normal by my interpretation.    COORDINATION OF CARE: 8:27 PM-Discussed treatment plan which includes rabies vaccinations, wound care and an antibiotic with pt at bedside and pt agreed to plan. Return precautions given.   Labs Review Labs Reviewed - No data to display  Imaging Review No results found.   EKG Interpretation None      MDM   Final diagnoses:  Dog bite of lower leg, left, initial encounter   Patient will have rabies vaccine and Augmentin. Vitals stable and patient afebrile.   I personally performed the services described in this documentation, which was scribed in my presence. The recorded information has been reviewed and is accurate.   [address] [place], PA-C 04/20/14 0136  Debby Freiberg, MD 04/22/14 [phone]

## 2014-04-22 ENCOUNTER — Encounter (HOSPITAL_COMMUNITY): Payer: Self-pay | Admitting: Family Medicine

## 2014-04-22 ENCOUNTER — Emergency Department (HOSPITAL_COMMUNITY)
Admission: EM | Admit: 2014-04-22 | Discharge: 2014-04-22 | Disposition: A | Discharge status: Home / Self Care | Payer: Medicaid Other | Attending: Emergency Medicine | Admitting: Emergency Medicine

## 2014-04-22 DIAGNOSIS — Z8719 Personal history of other diseases of the digestive system: Secondary | ICD-10-CM | POA: Insufficient documentation

## 2014-04-22 DIAGNOSIS — Z4800 Encounter for change or removal of nonsurgical wound dressing: Secondary | ICD-10-CM | POA: Insufficient documentation

## 2014-04-22 DIAGNOSIS — Z792 Long term (current) use of antibiotics: Secondary | ICD-10-CM | POA: Insufficient documentation

## 2014-04-22 DIAGNOSIS — Z5189 Encounter for other specified aftercare: Secondary | ICD-10-CM

## 2014-04-22 DIAGNOSIS — Z8669 Personal history of other diseases of the nervous system and sense organs: Secondary | ICD-10-CM | POA: Insufficient documentation

## 2014-04-22 DIAGNOSIS — I10 Essential (primary) hypertension: Secondary | ICD-10-CM | POA: Insufficient documentation

## 2014-04-22 DIAGNOSIS — Z21 Asymptomatic human immunodeficiency virus [HIV] infection status: Secondary | ICD-10-CM | POA: Diagnosis not present

## 2014-04-22 DIAGNOSIS — E119 Type 2 diabetes mellitus without complications: Secondary | ICD-10-CM | POA: Insufficient documentation

## 2014-04-22 DIAGNOSIS — Z8619 Personal history of other infectious and parasitic diseases: Secondary | ICD-10-CM | POA: Insufficient documentation

## 2014-04-22 DIAGNOSIS — Z8781 Personal history of (healed) traumatic fracture: Secondary | ICD-10-CM | POA: Insufficient documentation

## 2014-04-22 DIAGNOSIS — Z8739 Personal history of other diseases of the musculoskeletal system and connective tissue: Secondary | ICD-10-CM | POA: Diagnosis not present

## 2014-04-22 DIAGNOSIS — Z23 Encounter for immunization: Secondary | ICD-10-CM

## 2014-04-22 DIAGNOSIS — Z79899 Other long term (current) drug therapy: Secondary | ICD-10-CM | POA: Insufficient documentation

## 2014-04-22 DIAGNOSIS — Z720 Tobacco use: Secondary | ICD-10-CM | POA: Insufficient documentation

## 2014-04-22 MED ORDER — RABIES VACCINE, PCEC IM SUSR
1.0000 mL | Freq: Once | INTRAMUSCULAR | Status: AC
  Administered 2014-04-22: 1 mL via INTRAMUSCULAR
  Filled 2014-04-22 (×2): qty 1

## 2014-04-22 NOTE — Discharge Instructions (Signed)
Read the information below.  You may return to the Emergency Department at any time for worsening condition or any new symptoms that concern you.If you develop redness, swelling, pus draining from the wound, or fevers greater than 100.4, return to the ER immediately for a recheck.     Animal Bite Animal bite wounds can get infected. It is important to get proper medical treatment. Ask your doctor if you need a rabies shot. HOME CARE   Follow your doctor's instructions for taking care of your wound.  Only take medicine as told by your doctor.  Take your medicine (antibiotics) as told. Finish them even if you start to feel better.  Keep all doctor visits as told. You may need a tetanus shot if:   You cannot remember when you had your last tetanus shot.  You have never had a tetanus shot.  The injury broke your skin. If you need a tetanus shot and you choose not to have one, you may get tetanus. Sickness from tetanus can be serious. GET HELP RIGHT AWAY IF:   Your wound is warm, red, sore, or puffy (swollen).  You notice yellowish-white fluid (pus) or a bad smell coming from the wound.  You see a red line on the skin coming from the wound.  You have a fever, chills, or you feel sick.  You feel sick to your stomach (nauseous), or you throw up (vomit).  Your pain does not go away, or it gets worse.  You have trouble moving the injured part.  You have questions or concerns. MAKE SURE YOU:   Understand these instructions.  Will watch your condition.  Will get help right away if you are not doing well or get worse. Document Released: 02/13/2005 Document Revised: 05/08/2011 Document Reviewed: 10/05/2010 [facility] Patient Information 2015 [place], [place]. This information is not intended to replace advice given to you by your health care provider. Make sure you discuss any questions you have with your health care provider.

## 2014-04-22 NOTE — ED Notes (Signed)
Pt here for rabies shot. sts she had her initial ones here.

## 2014-04-22 NOTE — ED Notes (Signed)
Pt was seen 2/21 after dog bite. Here today for Rabies Vaccine.

## 2014-04-22 NOTE — ED Provider Notes (Signed)
CSN: [id number]     Arrival date & time 04/22/14  1354 History  This chart was scribed for Clayton Bibles, PA-C, working with Alfonzo Beers, MD by Girtha Hake, ED Scribe. The patient was seen in TR10C/TR10C. The patient's care was started at 3:44 PM.   Chief Complaint  Patient presents with  . Rabies Injection   HPI HPI Comments:  Ashley [name] is a 49 y.o. female who presents to the Emergency Department to receive a rabies shot. Patient was seen two days ago after a dog bite on her left lower leg. She reports that it is healing well and denies any drainage.  Denies fevers or uncontrolled pain.    PCP is Dr. Jeanie Cooks.  Past Medical History  Diagnosis Date  . HIV (human immunodeficiency virus infection)   . Hepatitis C   . Diabetes mellitus   . Anxiety   . Pinched nerve   . Scoliosis   . Hernia   . Ankle fracture   . Hypertension   . TB (tuberculosis)   . Depression    Past Surgical History  Procedure Laterality Date  . Cholecystectomy    . Cesarean section    . Hernia repair    . Liver biopsy     Family History  Problem Relation Age of Onset  . Diabetes Mother   . Hypertension Mother    History  Substance Use Topics  . Smoking status: Current Every Day Smoker -- 1.00 packs/day    Types: Cigarettes  . Smokeless tobacco: Never Used  . Alcohol Use: No   OB History    Gravida Para Term Preterm AB TAB SAB Ectopic Multiple Living   2 2        2      Review of Systems  Skin: Positive for wound.      Allergies  Review of patient's allergies indicates no known allergies.  Home Medications   Prior to Admission medications   Medication Sig Start Date End Date Taking? Authorizing Provider  amLODipine (NORVASC) 10 MG tablet Take 10 mg by mouth daily.    Historical Provider, MD  amoxicillin-clavulanate (AUGMENTIN) 875-125 MG per tablet Take 1 tablet by mouth every 12 (twelve) hours. 04/19/14   Kaitlyn Szekalski, PA-C  cetirizine (ZYRTEC) 10 MG tablet Take 10 mg by  mouth daily.    Historical Provider, MD  cloNIDine (CATAPRES) 0.2 MG tablet Take 0.2 mg by mouth 3 (three) times daily.     Historical Provider, MD  diphenhydrAMINE (BENADRYL) 25 mg capsule Take 1 capsule (25 mg total) by mouth every 6 (six) hours as needed. 08/20/13   Garald Balding, NP  emtricitabine-tenofovir (TRUVADA) 200-300 MG per tablet Take 1 tablet by mouth daily.    Historical Provider, MD  FLUoxetine (PROZAC) 40 MG capsule Take 40 mg by mouth daily.    Historical Provider, MD  hydrochlorothiazide (HYDRODIURIL) 25 MG tablet Take 25 mg by mouth daily.    Historical Provider, MD  HYDROcodone-acetaminophen (NORCO) 7.5-325 MG per tablet Take 1 tablet by mouth every 6 (six) hours as needed for moderate pain.    Historical Provider, MD  raltegravir (ISENTRESS) 400 MG tablet Take 400 mg by mouth daily.    Historical Provider, MD  trazodone (DESYREL) 300 MG tablet Take 300 mg by mouth at bedtime.    Historical Provider, MD   BP 94/64 mmHg  Pulse 79  Temp(Src) 98.7 F (37.1 C) (Oral)  Resp 18  Ht 4\' 7"  (1.397 m)  Wt 115  lb (52.164 kg)  BMI 26.73 kg/m2  SpO2 99% Physical Exam  Constitutional: She appears well-developed and well-nourished. No distress.  HENT:  Head: Normocephalic and atraumatic.  Neck: Neck supple.  Pulmonary/Chest: Effort normal.  Musculoskeletal:  Left lower leg with a healing wound. No erythema, edema, warmth, discharge, or tenderness. Small intact scab. No induration or fluctuance.   Neurological: She is alert.  Skin: She is not diaphoretic.  Nursing note and vitals reviewed.   ED Course  Procedures (including critical care time) COORDINATION OF CARE:    Labs Review Labs Reviewed - No data to display  Imaging Review No results found.   EKG Interpretation None      MDM   Final diagnoses:  Need for rabies vaccination  Visit for wound check    Afebrile, nontoxic patient with return to ED for rabies injection.  Pt informed she may follow up with  urgent care for remainder of the series.  Wound does not appear infected and pt denies any concerns at this time.   D/C home with [facility] urgent care center follow up.  Discussed result, findings, treatment, and follow up  with patient.  Pt given return precautions.  Pt verbalizes understanding and agrees with plan.       I personally performed the services described in this documentation, which was scribed in my presence. The recorded information has been reviewed and is accurate.    Clayton Bibles, PA-C 04/30/14 [place], MD 05/01/14 1028

## 2014-04-22 NOTE — ED Notes (Signed)
Pt made aware to return if symptoms worsen or if any life threatening symptoms occur.   

## 2014-04-29 ENCOUNTER — Encounter (HOSPITAL_COMMUNITY): Payer: Self-pay

## 2014-04-29 ENCOUNTER — Emergency Department (HOSPITAL_COMMUNITY)
Admission: EM | Admit: 2014-04-29 | Discharge: 2014-04-30 | Disposition: A | Discharge status: Other Acute Inpatient Hospital | Payer: Medicaid Other | Source: Home / Self Care | Attending: Emergency Medicine | Admitting: Emergency Medicine

## 2014-04-29 DIAGNOSIS — F1210 Cannabis abuse, uncomplicated: Secondary | ICD-10-CM | POA: Insufficient documentation

## 2014-04-29 DIAGNOSIS — I10 Essential (primary) hypertension: Secondary | ICD-10-CM | POA: Insufficient documentation

## 2014-04-29 DIAGNOSIS — Z8669 Personal history of other diseases of the nervous system and sense organs: Secondary | ICD-10-CM | POA: Insufficient documentation

## 2014-04-29 DIAGNOSIS — F419 Anxiety disorder, unspecified: Secondary | ICD-10-CM | POA: Insufficient documentation

## 2014-04-29 DIAGNOSIS — Z720 Tobacco use: Secondary | ICD-10-CM | POA: Insufficient documentation

## 2014-04-29 DIAGNOSIS — F329 Major depressive disorder, single episode, unspecified: Secondary | ICD-10-CM | POA: Insufficient documentation

## 2014-04-29 DIAGNOSIS — F29 Unspecified psychosis not due to a substance or known physiological condition: Secondary | ICD-10-CM | POA: Insufficient documentation

## 2014-04-29 DIAGNOSIS — Z590 Homelessness: Secondary | ICD-10-CM | POA: Insufficient documentation

## 2014-04-29 DIAGNOSIS — E119 Type 2 diabetes mellitus without complications: Secondary | ICD-10-CM | POA: Insufficient documentation

## 2014-04-29 DIAGNOSIS — M419 Scoliosis, unspecified: Secondary | ICD-10-CM | POA: Insufficient documentation

## 2014-04-29 DIAGNOSIS — R45851 Suicidal ideations: Secondary | ICD-10-CM

## 2014-04-29 DIAGNOSIS — Z79899 Other long term (current) drug therapy: Secondary | ICD-10-CM | POA: Insufficient documentation

## 2014-04-29 DIAGNOSIS — Z8611 Personal history of tuberculosis: Secondary | ICD-10-CM | POA: Insufficient documentation

## 2014-04-29 DIAGNOSIS — Z21 Asymptomatic human immunodeficiency virus [HIV] infection status: Secondary | ICD-10-CM | POA: Insufficient documentation

## 2014-04-29 DIAGNOSIS — Z8619 Personal history of other infectious and parasitic diseases: Secondary | ICD-10-CM | POA: Insufficient documentation

## 2014-04-29 LAB — RAPID URINE DRUG SCREEN, HOSP PERFORMED
AMPHETAMINES: NOT DETECTED
BENZODIAZEPINES: NOT DETECTED
Barbiturates: NOT DETECTED
Cocaine: NOT DETECTED
Opiates: NOT DETECTED
TETRAHYDROCANNABINOL: POSITIVE — AB

## 2014-04-29 LAB — COMPREHENSIVE METABOLIC PANEL
ALT: 65 U/L — AB (ref 0–35)
AST: 59 U/L — AB (ref 0–37)
Albumin: 4.3 g/dL (ref 3.5–5.2)
Alkaline Phosphatase: 68 U/L (ref 39–117)
Anion gap: 4 — ABNORMAL LOW (ref 5–15)
BUN: 11 mg/dL (ref 6–23)
CALCIUM: 8.9 mg/dL (ref 8.4–10.5)
CO2: 26 mmol/L (ref 19–32)
CREATININE: 0.83 mg/dL (ref 0.50–1.10)
Chloride: 109 mmol/L (ref 96–112)
GFR, EST NON AFRICAN AMERICAN: 81 mL/min — AB (ref >90)
GLUCOSE: 93 mg/dL (ref 70–99)
Potassium: 3.6 mmol/L (ref 3.5–5.1)
Sodium: 139 mmol/L (ref 135–145)
Total Bilirubin: 0.5 mg/dL (ref 0.3–1.2)
Total Protein: 8 g/dL (ref 6.0–8.3)

## 2014-04-29 LAB — CBC
HEMATOCRIT: 36.9 % (ref 36.0–46.0)
Hemoglobin: 12.7 g/dL (ref 12.0–15.0)
MCH: 31.6 pg (ref 26.0–34.0)
MCHC: 34.4 g/dL (ref 30.0–36.0)
MCV: 91.8 fL (ref 78.0–100.0)
PLATELETS: 131 10*3/uL — AB (ref 150–400)
RBC: 4.02 MIL/uL (ref 3.87–5.11)
RDW: 13.6 % (ref 11.5–15.5)
WBC: 4.8 10*3/uL (ref 4.0–10.5)

## 2014-04-29 LAB — ETHANOL: Alcohol, Ethyl (B): <5 mg/dL (ref 0–9)

## 2014-04-29 MED ORDER — IBUPROFEN 200 MG PO TABS
600.0000 mg | ORAL_TABLET | Freq: Three times a day (TID) | ORAL | Status: DC | PRN
  Administered 2014-04-29: 600 mg via ORAL
  Filled 2014-04-29: qty 3

## 2014-04-29 MED ORDER — ACETAMINOPHEN 325 MG PO TABS: 650.0000 mg | ORAL_TABLET | ORAL | Status: DC | PRN

## 2014-04-29 MED ORDER — TRAZODONE HCL 100 MG PO TABS
300.0000 mg | ORAL_TABLET | Freq: Every day | ORAL | Status: DC
  Administered 2014-04-29: 300 mg via ORAL
  Filled 2014-04-29: qty 3

## 2014-04-29 MED ORDER — ZOLPIDEM TARTRATE 5 MG PO TABS: 5.0000 mg | ORAL_TABLET | Freq: Every evening | ORAL | Status: DC | PRN

## 2014-04-29 MED ORDER — AMLODIPINE BESYLATE 10 MG PO TABS
10.0000 mg | ORAL_TABLET | Freq: Every day | ORAL | Status: DC
Start: 2014-04-29 — End: 2014-04-30
  Administered 2014-04-29: 10 mg via ORAL
  Filled 2014-04-29 (×2): qty 1

## 2014-04-29 MED ORDER — ONDANSETRON HCL 4 MG PO TABS: 4.0000 mg | ORAL_TABLET | Freq: Three times a day (TID) | ORAL | Status: DC | PRN

## 2014-04-29 MED ORDER — NICOTINE 21 MG/24HR TD PT24: 21.0000 mg | MEDICATED_PATCH | Freq: Every day | TRANSDERMAL | Status: DC | PRN

## 2014-04-29 MED ORDER — ALUM & MAG HYDROXIDE-SIMETH 200-200-20 MG/5ML PO SUSP: 30.0000 mL | ORAL | Status: DC | PRN

## 2014-04-29 NOTE — ED Notes (Signed)
Security called to wand pt and belongings.
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
## 2014-04-29 NOTE — ED Notes (Signed)
Pt Social Worker Contact: Jennelle Human  [phone] cell, [phone]

## 2014-04-29 NOTE — ED Notes (Signed)
Patient's reported symptoms are consistent with previous triage assessment. Patient reports being a patient of Dr. Criss Rosales and has been off scheduled psych medications.  POC discussed.

## 2014-04-29 NOTE — ED Provider Notes (Signed)
CSN: [id number]     Arrival date & time 04/29/14  1512 History   First MD Initiated Contact with Patient 04/29/14 1554     Chief Complaint  Patient presents with  . Hallucinations  . Suicidal     (Consider location/radiation/quality/duration/timing/severity/associated sxs/prior Treatment) HPI   Ashley [name] is a 49 y.o. female who presents for evaluation of audio and visual hallucinations. She is homeless. She states that she is seeing things that are not there, which she identifies as "shapes". She also hears voices that she does not recognize, that tell her that she would be better off dead. She has had this previously, but not recently. She has been prescribed medicines for psychiatric purposes, but is not taking them. She is seeing a therapist who brought her here today because of her reported hallucinations. She denies recent fever, chills, nausea, vomiting, chest pain, cough or shortness of breath. There are no other known modifying factors.   Past Medical History  Diagnosis Date  . HIV (human immunodeficiency virus infection)   . Hepatitis C   . Diabetes mellitus   . Anxiety   . Pinched nerve   . Scoliosis   . Hernia   . Ankle fracture   . Hypertension   . TB (tuberculosis)   . Depression    Past Surgical History  Procedure Laterality Date  . Cholecystectomy    . Cesarean section    . Hernia repair    . Liver biopsy     Family History  Problem Relation Age of Onset  . Diabetes Mother   . Hypertension Mother    History  Substance Use Topics  . Smoking status: Current Every Day Smoker -- 1.00 packs/day    Types: Cigarettes  . Smokeless tobacco: Never Used  . Alcohol Use: No   OB History    Gravida Para Term Preterm AB TAB SAB Ectopic Multiple Living   2 2        2      Review of Systems  All other systems reviewed and are negative.     Allergies  Review of patient's allergies indicates no known allergies.  Home Medications   Prior to Admission  medications   Medication Sig Start Date End Date Taking? Authorizing Provider  amLODipine (NORVASC) 10 MG tablet Take 10 mg by mouth at bedtime.    Yes Historical Provider, MD  cetirizine (ZYRTEC) 10 MG tablet Take 10 mg by mouth at bedtime.    Yes Historical Provider, MD  cloNIDine (CATAPRES) 0.2 MG tablet Take 0.2 mg by mouth 3 (three) times daily.    Yes Historical Provider, MD  HYDROcodone-acetaminophen (NORCO) 7.5-325 MG per tablet Take 1 tablet by mouth every 6 (six) hours as needed for moderate pain (pain).    Yes Historical Provider, MD  trazodone (DESYREL) 300 MG tablet Take 300 mg by mouth at bedtime.   Yes Historical Provider, MD   BP 148/78 mmHg  Pulse 72  Temp(Src) 98.1 F (36.7 C) (Oral)  Resp 18  SpO2 100% Physical Exam  Constitutional: She is oriented to person, place, and time. She appears well-developed and well-nourished.  HENT:  Head: Normocephalic and atraumatic.  Right Ear: External ear normal.  Left Ear: External ear normal.  Eyes: Conjunctivae and EOM are normal. Pupils are equal, round, and reactive to light.  Neck: Normal range of motion and phonation normal. Neck supple.  Cardiovascular: Normal rate, regular rhythm and normal heart sounds.   Pulmonary/Chest: Effort normal and breath sounds  normal. She exhibits no bony tenderness.  Abdominal: Soft. There is no tenderness.  Musculoskeletal: Normal range of motion.  Neurological: She is alert and oriented to person, place, and time. No cranial nerve deficit or sensory deficit. She exhibits normal muscle tone. Coordination normal.  Skin: Skin is warm, dry and intact.  Psychiatric: Her behavior is normal. Judgment and thought content normal.  She is tearful at times, when talking about her social situation.  Nursing note and vitals reviewed.   ED Course  Procedures (including critical care time)  Medications  acetaminophen (TYLENOL) tablet 650 mg (not administered)  ibuprofen (ADVIL,MOTRIN) tablet 600 mg  (600 mg Oral Given 04/29/14 2132)  zolpidem (AMBIEN) tablet 5 mg (not administered)  nicotine (NICODERM CQ - dosed in mg/24 hours) patch 21 mg (not administered)  ondansetron (ZOFRAN) tablet 4 mg (not administered)  alum & mag hydroxide-simeth (MAALOX/MYLANTA) 200-200-20 MG/5ML suspension 30 mL (not administered)  amLODipine (NORVASC) tablet 10 mg (10 mg Oral Given 04/29/14 2128)  traZODone (DESYREL) tablet 300 mg (300 mg Oral Given 04/29/14 2128)    Patient Vitals for the past 24 hrs:  BP Temp Temp src Pulse Resp SpO2  04/29/14 1532 148/78 mmHg 98.1 F (36.7 C) Oral 72 18 100 %       Labs Review Labs Reviewed  CBC - Abnormal; Notable for the following:    Platelets 131 (*)    All other components within normal limits  COMPREHENSIVE METABOLIC PANEL - Abnormal; Notable for the following:    AST 59 (*)    ALT 65 (*)    GFR calc non Af Amer 81 (*)    Anion gap 4 (*)    All other components within normal limits  URINE RAPID DRUG SCREEN (HOSP PERFORMED) - Abnormal; Notable for the following:    Tetrahydrocannabinol POSITIVE (*)    All other components within normal limits  ETHANOL    Imaging Review No results found.   EKG Interpretation None      MDM   Final diagnoses:  Psychosis, unspecified psychosis type  Suicidal ideation    Situational depression, and psychotic hallucinations. Audio hallucinations that tell her to commit suicide.  No active suicidal plan. No medical illness.  Nursing Notes Reviewed/ Care Coordinated, and agree without changes. Applicable Imaging Reviewed.  Interpretation of Laboratory Data incorporated into ED treatment  Plan: as per oncoming provider team, in conjunction with TTS  Richarda Blade, MD 04/30/14 [phone]

## 2014-04-29 NOTE — ED Notes (Signed)
Per pt, she has been hearing voices x 2 weeks.  Takes medications.  Previous history of same. Pt telling her she is "worthless and to try to kill self".  Pt has been staying with a friend.  Social worker with patient.  Pt has been depressed.  Thoughts of hurting self current.  Plan: walk in front of car.  HI: Ex friends

## 2014-04-29 NOTE — BH Assessment (Addendum)
Pt has been accepted to [facility] bed 500-2 under the care of Dr. Shea Evans, and can arrive after midnight Per Lyda Jester.   Supoprt paperwork faxed. Dr. To be informed closer to time of d/c per request of Dr. Eulis Foster.   Lear Ng, [facility] Triage Specialist 04/29/2014 7:33 PM

## 2014-04-29 NOTE — ED Notes (Signed)
Patient states "I feel like hurting myself"

## 2014-04-29 NOTE — ED Notes (Signed)
Patient presents calm and cooperative; endorses SI with no concrete plan; endorses AH of "mumbles"; denies HI and VH. NAD

## 2014-04-29 NOTE — ED Notes (Signed)
Delay on labs,  edp currently in with pt

## 2014-04-30 ENCOUNTER — Inpatient Hospital Stay (HOSPITAL_COMMUNITY)
Admission: AD | Admit: 2014-04-30 | Discharge: 2014-05-04 | DRG: 885 | Disposition: A | Discharge status: Home / Self Care | Payer: Medicaid Other | Source: Intra-hospital | Attending: Psychiatry | Admitting: Psychiatry

## 2014-04-30 ENCOUNTER — Encounter (HOSPITAL_COMMUNITY): Payer: Self-pay

## 2014-04-30 DIAGNOSIS — Z833 Family history of diabetes mellitus: Secondary | ICD-10-CM

## 2014-04-30 DIAGNOSIS — Z8249 Family history of ischemic heart disease and other diseases of the circulatory system: Secondary | ICD-10-CM | POA: Diagnosis not present

## 2014-04-30 DIAGNOSIS — M419 Scoliosis, unspecified: Secondary | ICD-10-CM | POA: Diagnosis present

## 2014-04-30 DIAGNOSIS — B1920 Unspecified viral hepatitis C without hepatic coma: Secondary | ICD-10-CM | POA: Diagnosis present

## 2014-04-30 DIAGNOSIS — B20 Human immunodeficiency virus [HIV] disease: Secondary | ICD-10-CM | POA: Diagnosis present

## 2014-04-30 DIAGNOSIS — G8929 Other chronic pain: Secondary | ICD-10-CM | POA: Diagnosis present

## 2014-04-30 DIAGNOSIS — F17210 Nicotine dependence, cigarettes, uncomplicated: Secondary | ICD-10-CM | POA: Diagnosis present

## 2014-04-30 DIAGNOSIS — G4700 Insomnia, unspecified: Secondary | ICD-10-CM | POA: Diagnosis present

## 2014-04-30 DIAGNOSIS — R45851 Suicidal ideations: Secondary | ICD-10-CM | POA: Diagnosis present

## 2014-04-30 DIAGNOSIS — Z801 Family history of malignant neoplasm of trachea, bronchus and lung: Secondary | ICD-10-CM | POA: Diagnosis not present

## 2014-04-30 DIAGNOSIS — E119 Type 2 diabetes mellitus without complications: Secondary | ICD-10-CM | POA: Diagnosis present

## 2014-04-30 DIAGNOSIS — Z9114 Patient's other noncompliance with medication regimen: Secondary | ICD-10-CM | POA: Diagnosis present

## 2014-04-30 DIAGNOSIS — F333 Major depressive disorder, recurrent, severe with psychotic symptoms: Principal | ICD-10-CM | POA: Diagnosis present

## 2014-04-30 DIAGNOSIS — Z823 Family history of stroke: Secondary | ICD-10-CM | POA: Diagnosis not present

## 2014-04-30 DIAGNOSIS — I10 Essential (primary) hypertension: Secondary | ICD-10-CM | POA: Diagnosis present

## 2014-04-30 LAB — GLUCOSE, CAPILLARY: Glucose-Capillary: 104 mg/dL — ABNORMAL HIGH (ref 70–99)

## 2014-04-30 MED ORDER — ACETAMINOPHEN 325 MG PO TABS
650.0000 mg | ORAL_TABLET | Freq: Four times a day (QID) | ORAL | Status: DC | PRN
  Administered 2014-05-03 (×2): 650 mg via ORAL
  Filled 2014-04-30 (×2): qty 2

## 2014-04-30 MED ORDER — LORAZEPAM 1 MG PO TABS: 1.0000 mg | ORAL_TABLET | ORAL | Status: DC | PRN

## 2014-04-30 MED ORDER — ZIPRASIDONE MESYLATE 20 MG IM SOLR: 20.0000 mg | INTRAMUSCULAR | Status: DC | PRN

## 2014-04-30 MED ORDER — AMLODIPINE BESYLATE 10 MG PO TABS
10.0000 mg | ORAL_TABLET | Freq: Every day | ORAL | Status: DC
  Administered 2014-04-30 – 2014-05-03 (×3): 10 mg via ORAL
  Filled 2014-04-30 (×5): qty 1

## 2014-04-30 MED ORDER — HYDROXYZINE HCL 25 MG PO TABS: 25.0000 mg | ORAL_TABLET | Freq: Four times a day (QID) | ORAL | Status: DC | PRN

## 2014-04-30 MED ORDER — MAGNESIUM HYDROXIDE 400 MG/5ML PO SUSP: 30.0000 mL | Freq: Every day | ORAL | Status: DC | PRN

## 2014-04-30 MED ORDER — QUETIAPINE FUMARATE 25 MG PO TABS
25.0000 mg | ORAL_TABLET | Freq: Two times a day (BID) | ORAL | Status: DC
  Administered 2014-04-30 – 2014-05-04 (×8): 25 mg via ORAL
  Filled 2014-04-30 (×12): qty 1

## 2014-04-30 MED ORDER — LORATADINE 10 MG PO TABS
10.0000 mg | ORAL_TABLET | Freq: Every day | ORAL | Status: DC
  Administered 2014-04-30 – 2014-05-04 (×5): 10 mg via ORAL
  Filled 2014-04-30 (×6): qty 1

## 2014-04-30 MED ORDER — CLONIDINE HCL 0.2 MG PO TABS
0.2000 mg | ORAL_TABLET | Freq: Three times a day (TID) | ORAL | Status: DC
Start: 2014-04-30 — End: 2014-05-04
  Administered 2014-04-30 – 2014-05-04 (×9): 0.2 mg via ORAL
  Filled 2014-04-30 (×16): qty 1

## 2014-04-30 MED ORDER — ALUM & MAG HYDROXIDE-SIMETH 200-200-20 MG/5ML PO SUSP
30.0000 mL | ORAL | Status: DC | PRN
  Administered 2014-05-04: 30 mL via ORAL
  Filled 2014-04-30: qty 30

## 2014-04-30 MED ORDER — FLUOXETINE HCL 20 MG PO CAPS
20.0000 mg | ORAL_CAPSULE | Freq: Every day | ORAL | Status: DC
  Administered 2014-04-30 – 2014-05-04 (×5): 20 mg via ORAL
  Filled 2014-04-30 (×7): qty 1

## 2014-04-30 MED ORDER — TRAZODONE HCL 100 MG PO TABS
100.0000 mg | ORAL_TABLET | Freq: Every day | ORAL | Status: DC
  Administered 2014-04-30 – 2014-05-03 (×3): 100 mg via ORAL
  Filled 2014-04-30 (×5): qty 1

## 2014-04-30 MED ORDER — ENSURE COMPLETE PO LIQD
237.0000 mL | Freq: Two times a day (BID) | ORAL | Status: DC
  Administered 2014-04-30 – 2014-05-04 (×9): 237 mL via ORAL

## 2014-04-30 MED ORDER — RISPERIDONE 2 MG PO TBDP
2.0000 mg | ORAL_TABLET | Freq: Three times a day (TID) | ORAL | Status: DC | PRN
Start: 2014-04-30 — End: 2014-05-04

## 2014-04-30 MED ORDER — TRAZODONE HCL 150 MG PO TABS
300.0000 mg | ORAL_TABLET | Freq: Every day | ORAL | Status: DC
  Filled 2014-04-30: qty 2
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
## 2014-04-30 NOTE — Progress Notes (Signed)
D: Patient presents with depressed affect and mood. She did not complete the self inventory sheet. Patient has been sleeping in bed all day. No interaction with peers on the hall. Complies to the current medication regimen.  A: Support and encouragement provided to patient. Scheduled medications given per MD orders. Maintain Q15 minute checks for safety.  R: Patient receptive. Endorses passive SI, but contracts for safety. Denies HI and AVH. Patient remains safe on the hall.

## 2014-04-30 NOTE — Progress Notes (Signed)
Adult Psychoeducational Group Note  Date:  04/30/2014 Time:  9:00 AM  Group Topic/Focus:  Morning Wellness Group  Participation Level:  Did Not Attend  Additional Comments: Patient was resting in bed during group.  Eduard Roux E 04/30/2014, 10:36 AM

## 2014-04-30 NOTE — BHH Suicide Risk Assessment (Signed)
[facility] Admission Suicide Risk Assessment   Nursing information obtained from:    Demographic factors:   49 year old single woman, unemployed, lives with a friend Current Mental Status:   see below Loss Factors:   unemployment, poor support network, chronic medical illnesses, death of mother last year Historical Factors:   history of depression and psychotic symptoms when depressed- medication non compliant for several months Risk Reduction Factors:   resilience  Total Time spent with patient: 45 minutes Principal Problem: MDD (major depressive disorder), recurrent, severe, with psychosis Diagnosis:   Patient Active Problem List   Diagnosis Date Noted  . MDD (major depressive disorder), recurrent, severe, with psychosis [F33.3] 04/30/2014  . Screening examination for venereal disease [Z11.3] 06/18/2013  . Encounter for long-term (current) use of other medications [Z79.899] 06/18/2013  . Elevated LFTs [R79.89] 01/13/2012  . Tuberculosis [A15.0] 12/19/2011  . Cigarette smoker [Z72.0] 12/19/2011  . DRUG ABUSE [IMO0002] 06/30/2009  . PERIPHERAL NEUROPATHY [G60.9] 06/16/2009  . HYPERTENSION [I10] 03/19/2009  . ABDOMINAL WALL HERNIA [K43.9] 03/19/2009  . BACK PAIN, CHRONIC [M54.9] 03/19/2009  . Human immunodeficiency virus (HIV) disease [B20] 03/18/2009  . Hepatitis C virus infection without hepatic coma [B19.20] 03/18/2009  . Type II or unspecified type diabetes mellitus without mention of complication, not stated as uncontrolled [E11.9] 03/18/2009  . SCHIZOPHRENIA [F20.9] 03/18/2009  . BIPOLAR AFFECTIVE DISORDER, DEPRESSED, SEVERE [F31.4] 03/18/2009     Continued Clinical Symptoms:  Alcohol Use Disorder Identification Test Final Score (AUDIT): 3 The "Alcohol Use Disorders Identification Test", Guidelines for Use in Primary Care, Second Edition.  World Pharmacologist [facility]). Score between 0-7:  no or low risk or alcohol related problems. Score between 8-15:  moderate risk of alcohol  related problems. Score between 16-19:  high risk of alcohol related problems. Score 20 or above:  warrants further diagnostic evaluation for alcohol dependence and treatment.   CLINICAL FACTORS:  49 year old female admitted due to worsening depression, suicidal ideations, auditory and visual hallucinations. Medication non compliant x several months .   Musculoskeletal: Strength & Muscle Tone: within normal limits Gait & Station: normal Patient leans: N/A  Psychiatric Specialty Exam: Physical Exam  ROS  Blood pressure 119/76, pulse 67, temperature 98.6 F (37 C), temperature source Oral, resp. rate 16, height 4\' 7"  (1.397 m), weight 110 lb (49.896 kg).Body mass index is 25.57 kg/(m^2).  SEE ADMIT NOTE MSE                                                        COGNITIVE FEATURES THAT CONTRIBUTE TO RISK:  Closed-mindedness and Loss of executive function    SUICIDE RISK:   Moderate:  Frequent suicidal ideation with limited intensity, and duration, some specificity in terms of plans, no associated intent, good self-control, limited dysphoria/symptomatology, some risk factors present, and identifiable protective factors, including available and accessible social support.  PLAN OF CARE: Patient will be admitted to inpatient psychiatric unit for stabilization and safety. Will provide and encourage milieu participation. Provide medication management and maked adjustments as needed.  Will follow daily.    Medical Decision Making:  Review of Psycho-Social Stressors (1), Review or order clinical lab tests (1), Established Problem, Worsening (2), Review of Medication Regimen & Side Effects (2) and Review of New Medication or Change in Dosage (2)  I  certify that inpatient services furnished can reasonably be expected to improve the patient's condition.   COBOS, [place] 04/30/2014, 1:27 PM

## 2014-04-30 NOTE — BHH Suicide Risk Assessment (Signed)
[facility] INPATIENT:  Family/Significant Other Suicide Prevention Education  Suicide Prevention Education:  Patient Refusal for Family/Significant Other Suicide Prevention Education: The patient DORY DEMONT has refused to provide written consent for family/significant other to be provided Family/Significant Other Suicide Prevention Education during admission and/or prior to discharge.  Physician notified. SPE reviewed with patient and brochure provided. Patient encouraged to return to hospital if having suicidal thoughts, patient verbalized his/her understanding and has no further questions at this time.   Deantae Shackleton, Casimiro Needle 04/30/2014, 12:25 PM

## 2014-04-30 NOTE — Tx Team (Signed)
Initial Interdisciplinary Treatment Plan   PATIENT STRESSORS: Financial difficulties Health problems Medication change or noncompliance Substance abuse   PATIENT STRENGTHS: Ability for insight General fund of knowledge Motivation for treatment/growth Supportive family/friends   PROBLEM LIST: Problem List/Patient Goals Date to be addressed Date deferred Reason deferred Estimated date of resolution  Risk for suicide      Psychosis      "want help with voices"                                           DISCHARGE CRITERIA:  Ability to meet basic life and health needs Improved stabilization in mood, thinking, and/or behavior Safe-care adequate arrangements made  PRELIMINARY DISCHARGE PLAN: Attend aftercare/continuing care group Attend PHP/IOP Outpatient therapy  PATIENT/FAMIILY INVOLVEMENT: This treatment plan has been presented to and reviewed with the patient, Ashley [name].  The patient and family have been given the opportunity to ask questions and make suggestions.  Ashley [name] A 04/30/2014, 3:26 AM

## 2014-04-30 NOTE — Progress Notes (Signed)
NUTRITION ASSESSMENT  Pt identified as at risk on the Malnutrition Screen Tool  INTERVENTION: 1. Educated patient on the importance of nutrition and encouraged intake of food and beverages. 2. Discussed weight goals. 3. Supplements: Ensure Complete po BID, each supplement provides 350 kcal and 13 grams of protein  NUTRITION DIAGNOSIS: Unintentional weight loss related to sub-optimal intake as evidenced by pt report.   Goal: Pt to meet >/= 90% of their estimated nutrition needs.  Monitor:  PO intake  Assessment:  Pt admitted with depression. Pt last used cocaine 3 months ago. Pt has been drinking 2-3 beers a day.  Pt reports fluctuating appetite for about 6 months. Pt reports 20 lb weight loss. UBW is 130 lb.  Per weight history, pt's weight has overall increased the past 6 months. Pt has been ordered Ensure BID, pt states she is drinking them.  Height: Ht Readings from Last 1 Encounters:  04/30/14 4\' 7"  (1.397 m)    Weight: Wt Readings from Last 1 Encounters:  04/30/14 110 lb (49.896 kg)    Weight Hx: Wt Readings from Last 10 Encounters:  04/30/14 110 lb (49.896 kg)  04/22/14 115 lb (52.164 kg)  04/19/14 115 lb (52.164 kg)  09/18/13 105 lb (47.628 kg)  07/21/13 105 lb (47.628 kg)  07/21/13 105 lb (47.628 kg)  06/18/13 103 lb (46.72 kg)  01/16/12 128 lb 9.6 oz (58.333 kg)  01/02/12 137 lb (62.143 kg)  12/19/11 140 lb (63.504 kg)    BMI:  Body mass index is 25.57 kg/(m^2). Pt meets criteria for overweight based on current BMI.  Estimated Nutritional Needs: Kcal: 25-30 kcal/kg Protein: > 1 gram protein/kg Fluid: 1 ml/kcal  Diet Order: Diet regular Pt is also offered choice of unit snacks mid-morning and mid-afternoon.  Pt is eating as desired.   Lab results and medications reviewed.   Clayton Bibles, MS, RD, LDN Pager: [phone] After Hours Pager: [phone]

## 2014-04-30 NOTE — BH Assessment (Signed)
Informed Dr. Eulis Foster of acceptance. He will complete transfer order.   Lear Ng, [facility] Triage Specialist 04/30/2014 12:04 AM

## 2014-04-30 NOTE — BHH Group Notes (Signed)
[facility] Group Notes:  (Counselor/Nursing/MHT/Case Management/Adjunct)  04/30/2014 1:15PM  Type of Therapy:  Group Therapy  Participation Level:  Active  Participation Quality:  Appropriate  Affect:  Flat  Cognitive:  Oriented  Insight:  Improving  Engagement in Group:  Limited  Engagement in Therapy:  Limited  Modes of Intervention:  Discussion, Exploration and Socialization  Summary of Progress/Problems: The topic for group was balance in life.  Pt participated in the discussion about when their life was in balance and out of balance and how this feels.  Pt discussed ways to get back in balance and short term goals they can work on to get where they want to be. Shared the loss of her mother, and became tearful.  Was feeling hopeless and described sometimes not wanting to go on.  The rest of the group did a nice job of allowing her to stay with her grief and loss and not divert.  She admitted that she tries to avoid these feelings, and knows that is not good for her.  "Instead of bottling it up, I need to let it out, or it becomes overwhelming."   Roque Lias B 04/30/2014 11:22 AM

## 2014-04-30 NOTE — Progress Notes (Signed)
Admission Note:  D:48 yr female who presents [facility] in no acute distress for the treatment of SI, AVH and Depression. Pt appears flat and depressed. Pt was calm and cooperative with admission process. Pt presents with passive SI and contracts for safety upon admission. Pt endorses AVH- command . Pt stated she just couldn't take the AVH for the past 2 weeks, so she decided to come in to get help. Pt parents dying few years ago continues to be a stressor, pt recently bit by dog 2 weeks ago and has 2 series of rabies shots to go.   A: Skin was assessed and found to be clear of any abnormal marks apart from . POC and unit policies explained and understanding verbalized. Consents obtained. Food and fluids offered, and fluids accepted.   R:Pt had no additional questions or concerns.

## 2014-05-01 ENCOUNTER — Encounter (HOSPITAL_COMMUNITY): Payer: Self-pay | Admitting: Infectious Diseases

## 2014-05-01 DIAGNOSIS — B182 Chronic viral hepatitis C: Secondary | ICD-10-CM

## 2014-05-01 DIAGNOSIS — E119 Type 2 diabetes mellitus without complications: Secondary | ICD-10-CM

## 2014-05-01 DIAGNOSIS — B20 Human immunodeficiency virus [HIV] disease: Secondary | ICD-10-CM

## 2014-05-01 LAB — PROTIME-INR
INR: 1 (ref 0.00–1.49)
Prothrombin Time: 13.3 seconds (ref 11.6–15.2)

## 2014-05-01 MED ORDER — DOLUTEGRAVIR SODIUM 50 MG PO TABS
50.0000 mg | ORAL_TABLET | Freq: Every day | ORAL | Status: DC
  Administered 2014-05-01 – 2014-05-04 (×4): 50 mg via ORAL
  Filled 2014-05-01 (×5): qty 1

## 2014-05-01 MED ORDER — EMTRICITABINE-TENOFOVIR DF 200-300 MG PO TABS
1.0000 | ORAL_TABLET | Freq: Every day | ORAL | Status: DC
  Administered 2014-05-01 – 2014-05-04 (×4): 1 via ORAL
  Filled 2014-05-01 (×5): qty 1

## 2014-05-01 MED ORDER — PNEUMOCOCCAL VAC POLYVALENT 25 MCG/0.5ML IJ INJ: 0.5000 mL | INJECTION | INTRAMUSCULAR | Status: DC

## 2014-05-01 MED ORDER — INFLUENZA VAC SPLIT QUAD 0.5 ML IM SUSY
0.5000 mL | PREFILLED_SYRINGE | INTRAMUSCULAR | Status: DC
  Filled 2014-05-01: qty 0.5

## 2014-05-01 NOTE — Progress Notes (Signed)
[facility] MD Progress Note  05/01/2014 3:09 PM KHADIJA THIER  MRN:  [id number] Subjective:  Patient reports she still feels depressed, sad, but acknowledges some improvement compared to admission. Auditory hallucinations have abated and she states she has not heard anything since this AM.  She reports low energy, some anhedonia. Poor self esteem. She denies medication side effects at present.  Objective: I have discussed case with treatment team and have met with patient. Patient remains depressed, sad, constricted in affect, but states she is feeling slightly better compared to how she was feeling prior to admission and she states she is not longer hearing voices at this time. She does not appear internally preoccupied. She has been less isolative and has been going to some groups, although has tended to be isolative at times.  No disruptive behaviors on unit. Appreciate ID consult to address HIV diseases and antiretroviral mediation non compliance x several months.  She states she has been feeling tired and fatigued, related to her depression.  Principal Problem: MDD (major depressive disorder), recurrent, severe, with psychosis Diagnosis:   Patient Active Problem List   Diagnosis Date Noted  . MDD (major depressive disorder), recurrent, severe, with psychosis [F33.3] 04/30/2014  . Screening examination for venereal disease [Z11.3] 06/18/2013  . Encounter for long-term (current) use of other medications [Z79.899] 06/18/2013  . Elevated LFTs [R79.89] 01/13/2012  . Tuberculosis [A15.0] 12/19/2011  . Cigarette smoker [Z72.0] 12/19/2011  . DRUG ABUSE [IMO0002] 06/30/2009  . PERIPHERAL NEUROPATHY [G60.9] 06/16/2009  . HYPERTENSION [I10] 03/19/2009  . ABDOMINAL WALL HERNIA [K43.9] 03/19/2009  . BACK PAIN, CHRONIC [M54.9] 03/19/2009  . Human immunodeficiency virus (HIV) disease [B20] 03/18/2009  . Hepatitis C virus infection without hepatic coma [B19.20] 03/18/2009  . Type II or unspecified type  diabetes mellitus without mention of complication, not stated as uncontrolled [E11.9] 03/18/2009  . SCHIZOPHRENIA [F20.9] 03/18/2009  . BIPOLAR AFFECTIVE DISORDER, DEPRESSED, SEVERE [F31.4] 03/18/2009   Total Time spent with patient: 20 minutes   Past Medical History:  Past Medical History  Diagnosis Date  . HIV (human immunodeficiency virus infection)   . Hepatitis C   . Diabetes mellitus   . Anxiety   . Pinched nerve   . Scoliosis   . Hernia   . Ankle fracture   . Hypertension   . TB (tuberculosis)   . Depression     Past Surgical History  Procedure Laterality Date  . Cholecystectomy    . Cesarean section    . Hernia repair    . Liver biopsy     Family History:  Family History  Problem Relation Age of Onset  . Diabetes Mother   . Hypertension Mother   . Stroke Mother     died 09-07-2013  Social History:  History  Alcohol Use No     History  Drug Use  . Yes  . Special: Marijuana, Cocaine    History   Social History  . Marital Status: Single    Spouse Name: N/A  . Number of Children: N/A  . Years of Education: N/A   Social History Main Topics  . Smoking status: Current Every Day Smoker -- 1.00 packs/day for 35 years    Types: Cigarettes  . Smokeless tobacco: Never Used  . Alcohol Use: No  . Drug Use: Yes    Special: Marijuana, Cocaine  . Sexual Activity: Yes    Birth Control/ Protection: Condom     Comment: given condoms   Other Topics Concern  .  None   Social History Narrative   Additional History:    Sleep: Fair  Appetite:  Fair   Assessment:   Musculoskeletal: Strength & Muscle Tone: within normal limits Gait & Station: normal Patient leans: Backward and N/A   Psychiatric Specialty Exam: Physical Exam  Review of Systems  Constitutional: Positive for fever and malaise/fatigue. Negative for chills.  Respiratory: Negative for cough and shortness of breath.   Cardiovascular: Negative for chest pain.  Gastrointestinal: Negative  for nausea and vomiting.  Genitourinary: Negative for dysuria, urgency and frequency.  Skin: Negative for rash.  Neurological: Negative for headaches.  Psychiatric/Behavioral: Positive for depression, hallucinations and substance abuse.    Blood pressure 105/59, pulse 58, temperature 98.7 F (37.1 C), temperature source Oral, resp. rate 16, height _0  (1.397 m), weight 110 lb (49.896 kg).Body mass index is 25.57 kg/(m^2).  General Appearance: Fairly Groomed  Engineer, water::  Fair  Speech:  Slow  Volume:  Decreased  Mood:  Depressed, although states she is feeling partially better today  Affect:  Constricted  Thought Process:  Goal Directed and Linear  Orientation:  Other:  fully alert and attentive  Thought Content:  audlotyr hallucinations have abated, not internally preoccupied at this time. No delusions expressed   Suicidal Thoughts:  Yes.  without intent/plan- at this time denies any thoughts of hurting self and  contracts for safety on unit   Homicidal Thoughts:  No  Memory:  recent and remote fair   Judgement:  Fair  Insight:  Fair  Psychomotor Activity:  Decreased  Concentration:  Fair  Recall:  Good  Fund of Knowledge:Good  Language: Good  Akathisia:  Negative  Handed:  Right  AIMS (if indicated):     Assets:  Desire for Improvement Resilience  ADL's:  Impaired  Cognition: fully alert and attentive  Sleep:  Number of Hours: 4.75     Current Medications: Current Facility-Administered Medications  Medication Dose Route Frequency Provider Last Rate Last Dose  . acetaminophen (TYLENOL) tablet 650 mg  650 mg Oral Q6H PRN Laverle Hobby, PA-C      . alum & mag hydroxide-simeth (MAALOX/MYLANTA) 200-200-20 MG/5ML suspension 30 mL  30 mL Oral Q4H PRN Laverle Hobby, PA-C      . amLODipine (NORVASC) tablet 10 mg  10 mg Oral QHS Laverle Hobby, PA-C   10 mg at 04/30/14 2208  . cloNIDine (CATAPRES) tablet 0.2 mg  0.2 mg Oral TID Laverle Hobby, PA-C   0.2 mg at 05/01/14  3818  . dolutegravir (TIVICAY) tablet 50 mg  50 mg Oral Daily Campbell Riches, MD   50 mg at 05/01/14 1211  . emtricitabine-tenofovir (TRUVADA) 200-300 MG per tablet 1 tablet  1 tablet Oral Daily Campbell Riches, MD   1 tablet at 05/01/14 1211  . feeding supplement (ENSURE COMPLETE) (ENSURE COMPLETE) liquid 237 mL  237 mL Oral BID BM Clayton Bibles, RD   237 mL at 05/01/14 1437  . FLUoxetine (PROZAC) capsule 20 mg  20 mg Oral Daily Jenne Campus, MD   20 mg at 05/01/14 2993  . hydrOXYzine (ATARAX/VISTARIL) tablet 25 mg  25 mg Oral Q6H PRN Laverle Hobby, PA-C      . [START ON 05/02/2014] Influenza vac split quadrivalent PF (FLUARIX) injection 0.5 mL  0.5 mL Intramuscular Tomorrow-1000 Campbell Riches, MD      . loratadine (CLARITIN) tablet 10 mg  10 mg Oral Daily Laverle Hobby, PA-C   10  mg at 05/01/14 0829  . risperiDONE (RISPERDAL M-TABS) disintegrating tablet 2 mg  2 mg Oral Q8H PRN Laverle Hobby, PA-C       And  . LORazepam (ATIVAN) tablet 1 mg  1 mg Oral PRN Laverle Hobby, PA-C       And  . ziprasidone (GEODON) injection 20 mg  20 mg Intramuscular PRN Laverle Hobby, PA-C      . magnesium hydroxide (MILK OF MAGNESIA) suspension 30 mL  30 mL Oral Daily PRN Laverle Hobby, PA-C      . [START ON 05/02/2014] pneumococcal 23 valent vaccine (PNU-IMMUNE) injection 0.5 mL  0.5 mL Intramuscular Tomorrow-1000 Campbell Riches, MD      . QUEtiapine (SEROQUEL) tablet 25 mg  25 mg Oral BID Jenne Campus, MD   25 mg at 05/01/14 0829  . traZODone (DESYREL) tablet 100 mg  100 mg Oral QHS Jenne Campus, MD   100 mg at 04/30/14 2208    Lab Results:  Results for orders placed or performed during the hospital encounter of 04/30/14 (from the past 48 hour(s))  Glucose, capillary     Status: Abnormal   Collection Time: 04/30/14 11:38 AM  Result Value Ref Range   Glucose-Capillary 104 (H) 70 - 99 mg/dL    Physical Findings: AIMS: Facial and Oral Movements Muscles of Facial Expression:  None, normal Lips and Perioral Area: None, normal Jaw: None, normal Tongue: None, normal,Extremity Movements Upper (arms, wrists, hands, fingers): None, normal Lower (legs, knees, ankles, toes): None, normal, Trunk Movements Neck, shoulders, hips: None, normal, Overall Severity Severity of abnormal movements (highest score from questions above): None, normal Incapacitation due to abnormal movements: None, normal Patient's awareness of abnormal movements (rate only patient's report): No Awareness, Dental Status Current problems with teeth and/or dentures?: Yes (missing) Does patient usually wear dentures?: No  CIWA:  CIWA-Ar Total: 0 COWS:  COWS Total Score: 0   Assessment- at this time patient is partially improved compared to admission status. She is still depressed, constricted in affect, ruminative, but hallucinations have abated, and is not currently suicidal . She is tolerating medications well, and ID consultant has evaluated and restarted her on antiretroviral medications  Treatment Plan Summary: Daily contact with patient to assess and evaluate symptoms and progress in treatment, Medication management, Plan continue inpatient treatment , and continue mediations as below  Trazodone 100 mgrs QHS Prozac 20 mgrs QDAY Seroquel 25 mgrs  BID Will follow   Medical Decision Making:  Established Problem, Stable/Improving (1), Review of Psycho-Social Stressors (1), Review or order clinical lab tests (1) and Review of Medication Regimen & Side Effects (2)     Shauntavia Brackin 05/01/2014, 3:09 PM

## 2014-05-01 NOTE — Clinical Social Work Note (Signed)
CSW spoke with social worker Jennelle Human [phone] regarding patient's progress and discharge plans. Social worker Olivia Mackie is working on trying to secure alternative housing for patient. CSW will continue to follow.  Tilden Fossa, MSW, [place] Worker [facility] [phone]

## 2014-05-01 NOTE — BHH Group Notes (Signed)
[facility] LCSW Aftercare Discharge Planning Group Note   05/01/2014 12:14 PM  Participation Quality:  Minimal  Mood/Affect:  Depressed  Depression Rating:  6  Anxiety Rating:  8  Thoughts of Suicide:  No Will you contract for safety?   NA  Current AVH:  No  Plan for Discharge/Comments:  Cybele looks and sounds sad.  She said she prefers to isolate when feeling like this, and was given positive reinforcement for coming to group.   Transportation Means: friend  Supports: friend  [place], [place] B

## 2014-05-01 NOTE — Progress Notes (Signed)
D: Pt presents with flat affect and depressed mood. Pt cautious during shift assessment and appeared to be minimizing symptoms. Pt forwarded little informations and has minimal interaction. Pt verbalized withdrawal s/s of tactile hallucinations, sweats and tremors. Pt reports minimal depression. Pt denies suicidal thoughts. Pt c/o pain in her right groin. May, NP assessed pt. Pt hypotensive at noon time and clonidine held. Pt given fluids at that time. Writer will continue to encouraged pt to drink fluids and reassess b/p. A: Medications administered as ordered per MD. Verbal support given. Pt encouraged to attend groups. 15 minute checks performed for safety.  R: Pt safety maintained.

## 2014-05-01 NOTE — Progress Notes (Signed)
D: Patient stated she is doing well. Pt reports decreased anxiety and depressive symptoms. Pt thought process is organized and behavior is appropriate. Pt interaction is minimal and forward little. Pt denies SI/HI/AVH and pain. Cooperative with assessment.   A: Met with pt 1:1. Medications administered as prescribed. Writer encouraged pt to discuss feelings. Pt encourage to increase fluid intake. Pitcher filled with water given to pt. Pt educated to rise slowly from sitting/lying position. Pt encouraged to come to staff with any questions or concerns.   R: Patient is safe on the unit. She is complaint with medications and denies any adverse reaction.

## 2014-05-01 NOTE — Progress Notes (Signed)
Called Infectious Disease and discussed patient's noncompliance with HIV meds for a year.  Pending visit in am.

## 2014-05-01 NOTE — Plan of Care (Signed)
Problem: Diagnosis: Increased Risk For Suicide Attempt Goal: STG-Patient Will Attend All Groups On The Unit Outcome: Not Progressing Pt did not attend evening karaoke this shift.

## 2014-05-01 NOTE — Plan of Care (Signed)
Problem: Diagnosis: Increased Risk For Suicide Attempt Goal: STG-Patient Will Comply With Medication Regime Outcome: Progressing Pt is compliant with scheduled medication regime.

## 2014-05-01 NOTE — Clinical Social Work Note (Signed)
CSW attempted to contact patient's social worker Linus Orn [phone] at [facility] at patient's request. No answer.  Tilden Fossa, MSW, [place] Worker [facility] [phone]

## 2014-05-01 NOTE — Tx Team (Signed)
Interdisciplinary Treatment Plan Update (Adult) Date: 05/01/2014   Time Reviewed: 9:30 AM  Progress in Treatment: Attending groups: Yes Participating in groups: Yes Taking medication as prescribed: Yes Tolerating medication: Yes Family/Significant other contact made: No, patient has declined for CSW to speak with family or friends Patient understands diagnosis: Yes Discussing patient identified problems/goals with staff: Yes Medical problems stabilized or resolved: Yes Denies suicidal/homicidal ideation: Yes Issues/concerns per patient self-inventory: Yes Other:  New problem(s) identified: N/A  Discharge Plan or Barriers:   3/4: Goal met: Patient plans to return to previous living situation with friends and will continue to follow up with [facility] at discharge.  Reason for Continuation of Hospitalization:  Depression Anxiety Medication Stabilization   Comments: N/A  Estimated length of stay: 3-5 days  For review of initial/current patient goals, please see plan of care.  Patient is a 49 year old African American admitted for SI, increasing depression and AH/VH. Patient reports that her symptoms have increased since her mother died in 14-Oct-2013. She endorses feelings of hopelessness. Patient is homeless in [place] and has been living with friends for 2 months. Patient receives services from [facility] in [place]. She reports limited family and social support. Patient will benefit from crisis stabilization, medication evaluation, group therapy, and psycho education in addition to case management for discharge planning. Patient and CSW reviewed pt's identified goals and treatment plan. Pt verbalized understanding and agreed to treatment plan.   Attendees: Patient:    Family:    Physician: Dr. Parke Poisson; Dr. Sabra Heck 05/01/2014 9:30 AM  Nursing: Grayland Ormond, Gaylan Gerold RN 05/01/2014 9:30 AM  Clinical Social Worker: Erasmo Downer Sourish Allender,  [place] 05/01/2014  9:30 AM  Other: Joette Catching, LCSW 05/01/2014 9:30 AM  Other: Lucinda Dell, Beverly Sessions Liaison 05/01/2014 9:30 AM  Other: Lars Pinks, Case Manager 05/01/2014 9:30 AM  Other: Earleen Newport, May Augustin, NP 05/01/2014 9:30 AM  Other: Edwyna Shell, LCSW 05/01/2014 9:30 AM  Other:    Other:       Scribe for Treatment Team:  Tilden Fossa, MSW, [employer] [phone]

## 2014-05-01 NOTE — Progress Notes (Signed)
Patient c/o tenderness/tenderness on right pubis area on palpation.  Stated that she used to lift heavy boxes when she was working.  Consider follow up Gyn as an outpatient.

## 2014-05-01 NOTE — Consult Note (Signed)
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  Greatly appreciate Dr Fransico Setters help with managing her DM.  Will also need ophtho, A1C  Dr Linus Salmons is available if questions over the w/e, otherwise I will return on Monday.   Thank you so much for this interesting consult,   Bobby Rumpf (pager) [phone] [email or web address]  AMAYRANI BENNICK is an 49 y.o. female.  HPI: 49 yo F with hx of HIV+, previously followed at [facility], transferred care to [address]. She was changed to DTGV/TRV at that time. She has 1 f/u since then, was not taking ART as written.  She is admitted to [facility] now on 3-3 with worsening depression, suicidal ideation, visual hallucinations, worsening since the death of her mother 08-Oct-2013.   Past Medical History  Diagnosis Date  . HIV (human immunodeficiency virus infection)   . Hepatitis C   . Diabetes mellitus   . Anxiety   . Pinched nerve   . Scoliosis   . Hernia   . Ankle fracture   . Hypertension   . TB (tuberculosis)   . Depression     Past Surgical History  Procedure Laterality Date  . Cholecystectomy    . Cesarean section    . Hernia repair    . Liver biopsy       No Known Allergies  Medications:  Scheduled: . amLODipine  10 mg Oral QHS  . cloNIDine  0.2 mg Oral TID  . feeding supplement (ENSURE COMPLETE)  237 mL Oral BID BM  . FLUoxetine   20 mg Oral Daily  . loratadine  10 mg Oral Daily  . QUEtiapine  25 mg Oral BID  . trazodone  100 mg Oral QHS    Abtx:  Anti-infectives    None      Total days of antibiotics 0          Social History:  reports that she has been smoking Cigarettes.  She has a 35 pack-year smoking history. She has never used smokeless tobacco. She reports that she uses illicit drugs (Marijuana). She reports that she does not drink alcohol.  Family History  Problem Relation Age of Onset  . Diabetes Mother   . Hypertension Mother     General ROS: no oral ulcers. previous dysphagia now resolved. occas cough with sputum production. + diarrhea. anal rash. no dysuria. see HPI.   Blood pressure 99/69, pulse 63, temperature 98.7 F (37.1 C), temperature source Oral, resp. rate 16, height 4\' 7"  (1.397 m), weight 49.896 kg (110 lb). General appearance: alert, cooperative and no distress Eyes: negative findings: conjunctivae and sclerae normal and pupils equal, round, reactive to light and accomodation Throat: lips, mucosa, and tongue normal; teeth and gums normal Neck: no adenopathy and supple, symmetrical, trachea midline Lungs: clear to auscultation bilaterally Heart: regular rate and rhythm Abdomen: normal findings: bowel sounds normal, soft, non-tender and hemmerhoids Extremities: edema none   Results for orders  placed or performed during the hospital encounter of 04/30/14 (from the past 48 hour(s))  Glucose, capillary     Status: Abnormal   Collection Time: 04/30/14 11:38 AM  Result Value Ref Range   Glucose-Capillary 104 (H) 70 - 99 mg/dL      Component Value Date/Time   SDES URINE, CLEAN CATCH 01/13/2012 1708   SPECREQUEST NONE 01/13/2012 1708   CULT ESCHERICHIA COLI 01/13/2012 1708   REPTSTATUS 01/15/2012 FINAL 01/13/2012 1708   No results found. No results found for this or any previous visit (from the past 240 hour(s)).    05/01/2014, 9:19 AM     LOS: 1 day

## 2014-05-01 NOTE — Progress Notes (Signed)
Patient ID: Ashley [name], female   DOB: [birth year], 49 y.o.   MRN: [id number] D: Client in bed earlier this shift, reports "been sleeping" with medications. Client does get up and go watch TV in dayroom, interacts appropriately with peers. A: Writer introduced self to client, reviewed medications, administers as ordered. Staff will monitor q67min for safety. R: Client is safe on the unit, attends group.

## 2014-05-02 LAB — HEPATITIS PANEL, ACUTE
HCV AB: REACTIVE — AB
HEP B C IGM: NONREACTIVE
Hep A IgM: NONREACTIVE
Hepatitis B Surface Ag: NEGATIVE

## 2014-05-02 LAB — IRON AND TIBC
IRON: 63 ug/dL (ref 42–145)
SATURATION RATIOS: 23 % (ref 20–55)
TIBC: 279 ug/dL (ref 250–470)
UIBC: 216 ug/dL (ref 125–400)

## 2014-05-02 NOTE — Progress Notes (Signed)
Writer has observed patient up in the dayroom watching tv with little to no interaction with peers. Patient reports having had a good day. Writer informed her of medications scheduled as ha and she is agreeable to taking them. Patient denies si/hi/a/v hallucinations. Support and encouragement given, safety maintained on unit with 15 min checks.

## 2014-05-02 NOTE — Progress Notes (Signed)
Writer spoke with NP Angelina Pih concerning patient's bp of 99/58. Patient scheduled to receive Norvasc 10 mg and Trazadone 100 mg. Due to patient's low blood pressure these medications were held. Patient given pitcher of water and encouraged to drink plenty of fluids if not tonight especially during the day on tomorrow.

## 2014-05-02 NOTE — Plan of Care (Signed)
Problem: Ineffective individual coping Goal: STG: Patient will remain free from self harm Outcome: Progressing Patient has remained free from self harm and denies suicidal ideations.

## 2014-05-02 NOTE — Progress Notes (Addendum)
Ashley [name] is very quiet, keeps to herself and does not speak unless she is spoken to.    A After much much encouragement, she is convinced to answer today's questions on her self inventory and it is at this time that she denies active SI within the past 24 hrs.SHe does  , however, rate her  depression, hopelessness and anxiety AS " 5/5/5/". Respectively .She takes all meds as ordered.    R Safety is in place.

## 2014-05-02 NOTE — Progress Notes (Signed)
[facility] MD Progress Note  05/02/2014 12:48 PM NNENNA MEADOR  MRN:  [id number] Subjective:  Pt reports feeling lightheaded/nausea since this AM. BP/P were low, so clonidine was held at noon. Pt reports drinking enough fluids. Patient reports she still feels depressed, sad, but acknowledges some improvement compared to admission. Auditory hallucinations have abated and she states she has not heard anything since this AM.  She reports low energy, some anhedonia. Poor self esteem. She denies medication side effects at present.  Objective: I have discussed case with treatment team and have met with patient. Patient remains depressed, sad, constricted in affect, but states she is feeling slightly better compared to how she was feeling prior to admission and she states she is not longer hearing voices at this time. She does not appear internally preoccupied. She has been less isolative and has been going to some groups, although has tended to be isolative at times.  No disruptive behaviors on unit. Appreciate ID consult to address HIV diseases and antiretroviral mediation non compliance x several months.  She states she has been feeling tired and fatigued, related to her depression.  Principal Problem: MDD (major depressive disorder), recurrent, severe, with psychosis Diagnosis:   Patient Active Problem List   Diagnosis Date Noted  . MDD (major depressive disorder), recurrent, severe, with psychosis [F33.3] 04/30/2014  . Screening examination for venereal disease [Z11.3] 06/18/2013  . Encounter for long-term (current) use of other medications [Z79.899] 06/18/2013  . Elevated LFTs [R79.89] 01/13/2012  . Tuberculosis [A15.0] 12/19/2011  . Cigarette smoker [Z72.0] 12/19/2011  . DRUG ABUSE [IMO0002] 06/30/2009  . PERIPHERAL NEUROPATHY [G60.9] 06/16/2009  . HYPERTENSION [I10] 03/19/2009  . ABDOMINAL WALL HERNIA [K43.9] 03/19/2009  . BACK PAIN, CHRONIC [M54.9] 03/19/2009  . Human immunodeficiency virus  (HIV) disease [B20] 03/18/2009  . Hepatitis C virus infection without hepatic coma [B19.20] 03/18/2009  . Type II or unspecified type diabetes mellitus without mention of complication, not stated as uncontrolled [E11.9] 03/18/2009  . SCHIZOPHRENIA [F20.9] 03/18/2009  . BIPOLAR AFFECTIVE DISORDER, DEPRESSED, SEVERE [F31.4] 03/18/2009   Total Time spent with patient: 20 minutes   Past Medical History:  Past Medical History  Diagnosis Date  . HIV (human immunodeficiency virus infection)   . Hepatitis C   . Diabetes mellitus   . Anxiety   . Pinched nerve   . Scoliosis   . Hernia   . Ankle fracture   . Hypertension   . TB (tuberculosis)   . Depression     Past Surgical History  Procedure Laterality Date  . Cholecystectomy    . Cesarean section    . Hernia repair    . Liver biopsy     Family History:  Family History  Problem Relation Age of Onset  . Diabetes Mother   . Hypertension Mother   . Stroke Mother     died 09/28/2013  Social History:  History  Alcohol Use No     History  Drug Use  . Yes  . Special: Marijuana, Cocaine    History   Social History  . Marital Status: Single    Spouse Name: N/A  . Number of Children: N/A  . Years of Education: N/A   Social History Main Topics  . Smoking status: Current Every Day Smoker -- 1.00 packs/day for 35 years    Types: Cigarettes  . Smokeless tobacco: Never Used  . Alcohol Use: No  . Drug Use: Yes    Special: Marijuana, Cocaine  . Sexual  Activity: Yes    Birth Control/ Protection: Condom     Comment: given condoms   Other Topics Concern  . None   Social History Narrative   Additional History:    Sleep: Fair  Appetite:  Fair   Assessment:   Musculoskeletal: Strength & Muscle Tone: within normal limits Gait & Station: normal Patient leans: Backward and N/A   Psychiatric Specialty Exam: Physical Exam  ROS  Blood pressure 112/73, pulse 61, temperature 98.6 F (37 C), temperature source Oral,  resp. rate 18, height 4' 7"  (1.397 m), weight 49.896 kg (110 lb).Body mass index is 25.57 kg/(m^2).  General Appearance: Fairly Groomed  Engineer, water::  Fair  Speech:  Slow  Volume:  Decreased  Mood:  Depressed, although states she is feeling partially better today  Affect:  Constricted  Thought Process:  Goal Directed and Linear  Orientation:  Other:  fully alert and attentive  Thought Content:  audlotyr hallucinations have abated, not internally preoccupied at this time. No delusions expressed   Suicidal Thoughts:  No- at this time denies any thoughts of hurting self and  contracts for safety on unit   Homicidal Thoughts:  No  Memory:  recent and remote fair   Judgement:  Fair  Insight:  Fair  Psychomotor Activity:  Decreased  Concentration:  Fair  Recall:  Good  Fund of Knowledge:Good  Language: Good  Akathisia:  Negative  Handed:  Right  AIMS (if indicated):     Assets:  Desire for Improvement Resilience  ADL's:  Impaired  Cognition: fully alert and attentive  Sleep:  Number of Hours: 6.75     Current Medications: Current Facility-Administered Medications  Medication Dose Route Frequency Provider Last Rate Last Dose  . acetaminophen (TYLENOL) tablet 650 mg  650 mg Oral Q6H PRN Laverle Hobby, PA-C      . alum & mag hydroxide-simeth (MAALOX/MYLANTA) 200-200-20 MG/5ML suspension 30 mL  30 mL Oral Q4H PRN Laverle Hobby, PA-C      . amLODipine (NORVASC) tablet 10 mg  10 mg Oral QHS Laverle Hobby, PA-C   10 mg at 05/01/14 2127  . cloNIDine (CATAPRES) tablet 0.2 mg  0.2 mg Oral TID Laverle Hobby, PA-C   0.2 mg at 05/02/14 0825  . dolutegravir (TIVICAY) tablet 50 mg  50 mg Oral Daily Campbell Riches, MD   50 mg at 05/02/14 9390  . emtricitabine-tenofovir (TRUVADA) 200-300 MG per tablet 1 tablet  1 tablet Oral Daily Campbell Riches, MD   1 tablet at 05/02/14 [phone]  . feeding supplement (ENSURE COMPLETE) (ENSURE COMPLETE) liquid 237 mL  237 mL Oral BID BM Clayton Bibles, RD    237 mL at 05/01/14 1437  . FLUoxetine (PROZAC) capsule 20 mg  20 mg Oral Daily Jenne Campus, MD   20 mg at 05/02/14 2330  . hydrOXYzine (ATARAX/VISTARIL) tablet 25 mg  25 mg Oral Q6H PRN Laverle Hobby, PA-C      . Influenza vac split quadrivalent PF (FLUARIX) injection 0.5 mL  0.5 mL Intramuscular Tomorrow-1000 Campbell Riches, MD      . loratadine (CLARITIN) tablet 10 mg  10 mg Oral Daily Laverle Hobby, PA-C   10 mg at 05/02/14 0762  . risperiDONE (RISPERDAL M-TABS) disintegrating tablet 2 mg  2 mg Oral Q8H PRN Laverle Hobby, PA-C       And  . LORazepam (ATIVAN) tablet 1 mg  1 mg Oral PRN Laverle Hobby, PA-C  And  . ziprasidone (GEODON) injection 20 mg  20 mg Intramuscular PRN Laverle Hobby, PA-C      . magnesium hydroxide (MILK OF MAGNESIA) suspension 30 mL  30 mL Oral Daily PRN Laverle Hobby, PA-C      . pneumococcal 23 valent vaccine (PNU-IMMUNE) injection 0.5 mL  0.5 mL Intramuscular Tomorrow-1000 Campbell Riches, MD      . QUEtiapine (SEROQUEL) tablet 25 mg  25 mg Oral BID Jenne Campus, MD   25 mg at 05/02/14 7591  . traZODone (DESYREL) tablet 100 mg  100 mg Oral QHS Jenne Campus, MD   100 mg at 05/01/14 2127    Lab Results:  Results for orders placed or performed during the hospital encounter of 04/30/14 (from the past 48 hour(s))  Hepatitis panel, acute     Status: Abnormal   Collection Time: 05/01/14  7:45 PM  Result Value Ref Range   Hepatitis B Surface Ag NEGATIVE NEGATIVE   HCV Ab Reactive (A) NEGATIVE   Hep A IgM NON REACTIVE NON REACTIVE    Comment: (NOTE) Effective January 12, 2014, Hepatitis Acute Panel (test code [phone]) will be revised to automatically reflex to the Hepatitis C Viral RNA, Quantitative, Real-Time PCR assay if the Hepatitis C antibody screening result is Reactive. This action is being taken to ensure that the CDC/USPSTF recommended HCV diagnostic algorithm with the appropriate test reflex needed for accurate  interpretation is followed.    Hep B C IgM NON REACTIVE NON REACTIVE    Comment: (NOTE) High levels of Hepatitis B Core IgM antibody are detectable during the acute stage of Hepatitis B. This antibody is used to differentiate current from past HBV infection. Performed at [place] and [facility]     Status: None   Collection Time: 05/01/14  7:45 PM  Result Value Ref Range   Iron 63 42 - 145 ug/dL   TIBC 279 250 - 470 ug/dL   Saturation Ratios 23 20 - 55 %   UIBC 216 125 - 400 ug/dL    Comment: Performed at [place]     Status: None   Collection Time: 05/01/14  7:45 PM  Result Value Ref Range   Prothrombin Time 13.3 11.6 - 15.2 seconds   INR 1.00 0.00 - 1.49    Comment: Performed at [facility]    Physical Findings: AIMS: Facial and Oral Movements Muscles of Facial Expression: None, normal Lips and Perioral Area: None, normal Jaw: None, normal Tongue: None, normal,Extremity Movements Upper (arms, wrists, hands, fingers): None, normal Lower (legs, knees, ankles, toes): None, normal, Trunk Movements Neck, shoulders, hips: None, normal, Overall Severity Severity of abnormal movements (highest score from questions above): None, normal Incapacitation due to abnormal movements: None, normal Patient's awareness of abnormal movements (rate only patient's report): No Awareness, Dental Status Current problems with teeth and/or dentures?: Yes (missing) Does patient usually wear dentures?: No  CIWA:  CIWA-Ar Total: 0 COWS:  COWS Total Score: 0   Assessment- at this time patient is partially improved compared to admission status. She is still depressed, constricted in affect, ruminative, but hallucinations have abated, and is not currently suicidal . She is tolerating medications well, and ID consultant has evaluated and restarted her on antiretroviral medications  Treatment Plan Summary: Daily contact with patient to assess and  evaluate symptoms and progress in treatment, Medication management, Plan continue inpatient treatment , and continue mediations as below  Trazodone 100 mgrs QHS Prozac 20 mgrs QDAY Seroquel 25 mgrs  BID Will follow   Medical Decision Making:  Established Problem, Stable/Improving (1), Review of Psycho-Social Stressors (1), Review or order clinical lab tests (1) and Review of Medication Regimen & Side Effects (2)     Alyissa Whidbee 05/02/2014, 12:48 PM

## 2014-05-02 NOTE — Plan of Care (Signed)
Problem: Diagnosis: Increased Risk For Suicide Attempt Goal: STG-Patient Will Attend All Groups On The Unit Outcome: Progressing Patient attended group this evening and participated.

## 2014-05-02 NOTE — Progress Notes (Signed)
Adult Psychoeducational Group Note  Date:  05/02/2014 Time:  9:02 PM  Group Topic/Focus:  Wrap-Up Group:   The focus of this group is to help patients review their daily goal of treatment and discuss progress on daily workbooks.  Participation Level:  Active  Participation Quality:  Appropriate  Affect:  Appropriate  Cognitive:  Appropriate  Insight: Appropriate  Engagement in Group:  Engaged  Modes of Intervention:  Discussion  Additional Comments:  The patient expressed that she attended the earlier group.The also said that in group  they talked about  how weather makes you feel better.  Nash Shearer 05/02/2014, 9:02 PM

## 2014-05-02 NOTE — [facility] Group Notes (Signed)
[place] Group Notes:  (Clinical Social Work)  05/02/2014  11:15-12:00PM  Summary of Progress/Problems:   The main focus of today's process group was to discuss patients' feelings about hospitalization, the stigma attached to mental health, and sources of motivation to stay well.  We then worked to identify a specific plan to avoid future hospitalizations when discharged from the hospital for this admission.  The patient expressed little during group, kept her eyes closed much of the time.  She did state at the end of group that if she wants to stay out of the hospital, she needs to call people for help when she needs it.  She stated she has a list of people to call for such help.  She related to another, younger patient who was talking about issues with mother, and offered her support, encouragement and someone to talk to.  Type of Therapy:  Group Therapy - Process  Participation Level:  Active  Participation Quality:  Drowsy and Supportive  Affect:  Blunted  Cognitive:  Appropriate  Insight:  Developing/Improving  Engagement in Therapy:  Developing/Improving  Modes of Intervention:  Exploration, Discussion  Selmer Dominion, LCSW 05/02/2014, 12:47 PM

## 2014-05-03 LAB — RPR: RPR Ser Ql: NONREACTIVE

## 2014-05-03 NOTE — Progress Notes (Signed)
Ashley [name] remains quiet, sad and flat.    A She does take her meds as ordered and she completes her daily assessment, answering the questions when staff asks them. She says she has not experienced any SI within the apst 24 hrs and she rates her depression, hopelessness and anxiety " 0/0/0", respectively. She attends her groups, says the ENsure is helping ehr feel stronger and she is engageing in her poc.   R Safety in place.

## 2014-05-03 NOTE — Progress Notes (Signed)
[facility] MD Progress Note  05/03/2014 5:31 PM Ashley [name]  MRN:  [id number] Subjective:  Pt reports feeling better today. No current dizziness/lightheadedness. Auditory hallucinations have abated and she states she has not heard anything since this AM.  She reports low energy, some anhedonia. Poor self esteem. She denies medication side effects at present.  Objective: I have discussed case with treatment team and have met with patient. Patient remains depressed, sad, constricted in affect, but states she is feeling slightly better compared to how she was feeling prior to admission and she states she is not longer hearing voices at this time. She does not appear internally preoccupied. She has been less isolative and has been going to some groups, although has tended to be isolative at times.  No disruptive behaviors on unit. Appreciate ID consult to address HIV diseases and antiretroviral mediation non compliance x several months.  She states she has been feeling tired and fatigued, related to her depression.  Principal Problem: MDD (major depressive disorder), recurrent, severe, with psychosis Diagnosis:   Patient Active Problem List   Diagnosis Date Noted  . MDD (major depressive disorder), recurrent, severe, with psychosis [F33.3] 04/30/2014  . Screening examination for venereal disease [Z11.3] 06/18/2013  . Encounter for long-term (current) use of other medications [Z79.899] 06/18/2013  . Elevated LFTs [R79.89] 01/13/2012  . Tuberculosis [A15.0] 12/19/2011  . Cigarette smoker [Z72.0] 12/19/2011  . DRUG ABUSE [IMO0002] 06/30/2009  . PERIPHERAL NEUROPATHY [G60.9] 06/16/2009  . HYPERTENSION [I10] 03/19/2009  . ABDOMINAL WALL HERNIA [K43.9] 03/19/2009  . BACK PAIN, CHRONIC [M54.9] 03/19/2009  . Human immunodeficiency virus (HIV) disease [B20] 03/18/2009  . Hepatitis C virus infection without hepatic coma [B19.20] 03/18/2009  . Type II or unspecified type diabetes mellitus without mention  of complication, not stated as uncontrolled [E11.9] 03/18/2009  . SCHIZOPHRENIA [F20.9] 03/18/2009  . BIPOLAR AFFECTIVE DISORDER, DEPRESSED, SEVERE [F31.4] 03/18/2009   Total Time spent with patient: 20 minutes   Past Medical History:  Past Medical History  Diagnosis Date  . HIV (human immunodeficiency virus infection)   . Hepatitis C   . Diabetes mellitus   . Anxiety   . Pinched nerve   . Scoliosis   . Hernia   . Ankle fracture   . Hypertension   . TB (tuberculosis)   . Depression     Past Surgical History  Procedure Laterality Date  . Cholecystectomy    . Cesarean section    . Hernia repair    . Liver biopsy     Family History:  Family History  Problem Relation Age of Onset  . Diabetes Mother   . Hypertension Mother   . Stroke Mother     died july 2015   Social History:  History  Alcohol Use No     History  Drug Use  . Yes  . Special: Marijuana, Cocaine    History   Social History  . Marital Status: Single    Spouse Name: N/A  . Number of Children: N/A  . Years of Education: N/A   Social History Main Topics  . Smoking status: Current Every Day Smoker -- 1.00 packs/day for 35 years    Types: Cigarettes  . Smokeless tobacco: Never Used  . Alcohol Use: No  . Drug Use: Yes    Special: Marijuana, Cocaine  . Sexual Activity: Yes    Birth Control/ Protection: Condom     Comment: given condoms   Other Topics Concern  . None   Social   History Narrative   Additional History:    Sleep: Fair  Appetite:  Fair   Assessment:   Musculoskeletal: Strength & Muscle Tone: within normal limits Gait & Station: normal Patient leans: Backward and N/A   Psychiatric Specialty Exam: Physical Exam  ROS  Blood pressure 125/88, pulse 72, temperature 97.6 F (36.4 C), temperature source Oral, resp. rate 20, height 4' 7" (1.397 m), weight 49.896 kg (110 lb).Body mass index is 25.57 kg/(m^2).  General Appearance: Fairly Groomed  Engineer, water::  Fair  Speech:   Slow  Volume:  Decreased  Mood:  Depressed, although states she is feeling partially better today  Affect:  Constricted  Thought Process:  Goal Directed and Linear  Orientation:  Other:  fully alert and attentive  Thought Content:  audlotyr hallucinations have abated, not internally preoccupied at this time. No delusions expressed   Suicidal Thoughts:  No- at this time denies any thoughts of hurting self and  contracts for safety on unit   Homicidal Thoughts:  No  Memory:  recent and remote fair   Judgement:  Fair  Insight:  Fair  Psychomotor Activity:  Decreased  Concentration:  Fair  Recall:  Good  Fund of Knowledge:Good  Language: Good  Akathisia:  Negative  Handed:  Right  AIMS (if indicated):     Assets:  Desire for Improvement Resilience  ADL's:  Impaired  Cognition: fully alert and attentive  Sleep:  Number of Hours: 6.75     Current Medications: Current Facility-Administered Medications  Medication Dose Route Frequency Provider Last Rate Last Dose  . acetaminophen (TYLENOL) tablet 650 mg  650 mg Oral Q6H PRN Laverle Hobby, PA-C   650 mg at 05/03/14 1704  . alum & mag hydroxide-simeth (MAALOX/MYLANTA) 200-200-20 MG/5ML suspension 30 mL  30 mL Oral Q4H PRN Laverle Hobby, PA-C      . amLODipine (NORVASC) tablet 10 mg  10 mg Oral QHS Laverle Hobby, PA-C   10 mg at 05/01/14 2127  . cloNIDine (CATAPRES) tablet 0.2 mg  0.2 mg Oral TID Skip Estimable, MD   0.2 mg at 05/02/14 1700  . dolutegravir (TIVICAY) tablet 50 mg  50 mg Oral Daily Campbell Riches, MD   50 mg at 05/03/14 0930  . emtricitabine-tenofovir (TRUVADA) 200-300 MG per tablet 1 tablet  1 tablet Oral Daily Campbell Riches, MD   1 tablet at 05/03/14 0930  . feeding supplement (ENSURE COMPLETE) (ENSURE COMPLETE) liquid 237 mL  237 mL Oral BID BM Clayton Bibles, RD   237 mL at 05/03/14 1400  . FLUoxetine (PROZAC) capsule 20 mg  20 mg Oral Daily Jenne Campus, MD   20 mg at 05/03/14 7106  . hydrOXYzine  (ATARAX/VISTARIL) tablet 25 mg  25 mg Oral Q6H PRN Laverle Hobby, PA-C      . Influenza vac split quadrivalent PF (FLUARIX) injection 0.5 mL  0.5 mL Intramuscular Tomorrow-1000 Campbell Riches, MD      . loratadine (CLARITIN) tablet 10 mg  10 mg Oral Daily Laverle Hobby, PA-C   10 mg at 05/03/14 0930  . risperiDONE (RISPERDAL M-TABS) disintegrating tablet 2 mg  2 mg Oral Q8H PRN Laverle Hobby, PA-C       And  . LORazepam (ATIVAN) tablet 1 mg  1 mg Oral PRN Laverle Hobby, PA-C       And  . ziprasidone (GEODON) injection 20 mg  20 mg Intramuscular PRN Laverle Hobby, PA-C      .  magnesium hydroxide (MILK OF MAGNESIA) suspension 30 mL  30 mL Oral Daily PRN Laverle Hobby, PA-C      . pneumococcal 23 valent vaccine (PNU-IMMUNE) injection 0.5 mL  0.5 mL Intramuscular Tomorrow-1000 Campbell Riches, MD      . QUEtiapine (SEROQUEL) tablet 25 mg  25 mg Oral BID Jenne Campus, MD   25 mg at 05/03/14 1704  . traZODone (DESYREL) tablet 100 mg  100 mg Oral QHS Jenne Campus, MD   100 mg at 05/01/14 2127    Lab Results:  Results for orders placed or performed during the hospital encounter of 04/30/14 (from the past 48 hour(s))  RPR     Status: None   Collection Time: 05/01/14  7:45 PM  Result Value Ref Range   RPR Ser Ql Non Reactive Non Reactive    Comment: (NOTE) Performed At: [facility] [place], [place] [id number] Lindon Romp MD NU:[id number] Performed at [facility]   Hepatitis panel, acute     Status: Abnormal   Collection Time: 05/01/14  7:45 PM  Result Value Ref Range   Hepatitis B Surface Ag NEGATIVE NEGATIVE   HCV Ab Reactive (A) NEGATIVE   Hep A IgM NON REACTIVE NON REACTIVE    Comment: (NOTE) Effective January 12, 2014, Hepatitis Acute Panel (test code 22940) will be revised to automatically reflex to the Hepatitis C Viral RNA, Quantitative, Real-Time PCR assay if the Hepatitis C antibody screening result is  Reactive. This action is being taken to ensure that the CDC/USPSTF recommended HCV diagnostic algorithm with the appropriate test reflex needed for accurate interpretation is followed.    Hep B C IgM NON REACTIVE NON REACTIVE    Comment: (NOTE) High levels of Hepatitis B Core IgM antibody are detectable during the acute stage of Hepatitis B. This antibody is used to differentiate current from past HBV infection. Performed at [place] and [facility]     Status: None   Collection Time: 05/01/14  7:45 PM  Result Value Ref Range   Iron 63 42 - 145 ug/dL   TIBC 279 250 - 470 ug/dL   Saturation Ratios 23 20 - 55 %   UIBC 216 125 - 400 ug/dL    Comment: Performed at [facility]     Status: None   Collection Time: 05/01/14  7:45 PM  Result Value Ref Range   Prothrombin Time 13.3 11.6 - 15.2 seconds   INR 1.00 0.00 - 1.49    Comment: Performed at [facility]    Physical Findings: AIMS: Facial and Oral Movements Muscles of Facial Expression: None, normal Lips and Perioral Area: None, normal Jaw: None, normal Tongue: None, normal,Extremity Movements Upper (arms, wrists, hands, fingers): None, normal Lower (legs, knees, ankles, toes): None, normal, Trunk Movements Neck, shoulders, hips: None, normal, Overall Severity Severity of abnormal movements (highest score from questions above): None, normal Incapacitation due to abnormal movements: None, normal Patient's awareness of abnormal movements (rate only patient's report): No Awareness, Dental Status Current problems with teeth and/or dentures?: Yes (missing) Does patient usually wear dentures?: No  CIWA:  CIWA-Ar Total: 0 COWS:  COWS Total Score: 0   Assessment- at this time patient is partially improved compared to admission status. She is still depressed, constricted in affect, ruminative, but hallucinations have abated, and is not currently suicidal . She is tolerating  medications well, and ID consultant has evaluated and  restarted her on antiretroviral medications  Treatment Plan Summary: Daily contact with patient to assess and evaluate symptoms and progress in treatment, Medication management, Plan continue inpatient treatment , and continue mediations as below  Trazodone 100 mgrs QHS Prozac 20 mgrs QDAY Seroquel 25 mgrs  BID Will follow   Medical Decision Making:  Established Problem, Stable/Improving (1), Review of Psycho-Social Stressors (1), Review or order clinical lab tests (1) and Review of Medication Regimen & Side Effects (2)     ,  05/03/2014, 5:31 PM  

## 2014-05-03 NOTE — [facility] Group Notes (Signed)
[facility] Group Notes:  (Clinical Social Work)  05/03/2014   11:15am-12:00pm  Summary of Progress/Problems:  The main focus of today's process group was to listen to a variety of genres of music and to identify that different types of music provoke different responses.  The patient then was able to identify personally what was soothing for them, as well as energizing.  Handouts were used to record feelings evoked, as well as how patient can personally use this knowledge in sleep habits, with depression, and with other symptoms.  The patient expressed understanding of concepts, as well as knowledge of how each type of music affected him/her and how this can be used at home as a wellness/recovery tool.  Some music made her sad, tearful and was turned off by CSW as a result.  Other music was enjoyable and relaxing for her.  Type of Therapy:  Music Therapy   Participation Level:  Active  Participation Quality:  Attentive and Sharing  Affect:  Blunted  Cognitive:  Oriented  Insight:  Engaged  Engagement in Therapy:  Engaged  Modes of Intervention:   Activity, Exploration  Selmer Dominion, LCSW 05/03/2014, 12:30pm

## 2014-05-03 NOTE — Progress Notes (Signed)
Adult Psychoeducational Group Note  Date:  05/03/2014 Time:  8:53 PM  Group Topic/Focus:  Wrap-Up Group:   The focus of this group is to help patients review their daily goal of treatment and discuss progress on daily workbooks.  Participation Level:  Active  Participation Quality:  Appropriate  Affect:  Appropriate  Cognitive:  Appropriate  Insight: Appropriate  Engagement in Group:  Engaged  Modes of Intervention:  Discussion  Additional Comments: The patient expressed that her healthy support system is her Education officer, museum.The patient also said that her day went well.  Nash Shearer 05/03/2014, 8:53 PM

## 2014-05-04 LAB — HCV RNA QUANT RFLX ULTRA OR GENOTYP
HCV Quantitative Log: 6.02 {Log} — ABNORMAL HIGH (ref <1.18)
HCV Quantitative: 1040881 IU/mL — ABNORMAL HIGH (ref <15)

## 2014-05-04 LAB — GC/CHLAMYDIA PROBE AMP (~~LOC~~) NOT AT ARMC
CHLAMYDIA, DNA PROBE: NEGATIVE
NEISSERIA GONORRHEA: NEGATIVE

## 2014-05-04 LAB — ANTINUCLEAR ANTIBODIES, IFA: ANA Ab, IFA: NEGATIVE

## 2014-05-04 LAB — HEMOGLOBIN A1C
HEMOGLOBIN A1C: 5.6 % (ref 4.8–5.6)
MEAN PLASMA GLUCOSE: 114 mg/dL

## 2014-05-04 LAB — HIV-1 RNA ULTRAQUANT REFLEX TO GENTYP+: HIV-1 RNA QUANT, LOG: UNDETERMINED {Log_copies}/mL

## 2014-05-04 MED ORDER — QUETIAPINE FUMARATE 25 MG PO TABS: 25.0000 mg | ORAL_TABLET | Freq: Two times a day (BID) | ORAL | Status: DC

## 2014-05-04 MED ORDER — BARRIER CREAM NON-SPECIFIED
1.0000 "application " | TOPICAL_CREAM | Freq: Two times a day (BID) | TOPICAL | Status: DC | PRN
  Filled 2014-05-04: qty 1

## 2014-05-04 MED ORDER — DOLUTEGRAVIR SODIUM 50 MG PO TABS: 50.0000 mg | ORAL_TABLET | Freq: Every day | ORAL | Status: DC

## 2014-05-04 MED ORDER — TRAZODONE HCL 100 MG PO TABS: 100.0000 mg | ORAL_TABLET | Freq: Every day | ORAL | Status: DC

## 2014-05-04 MED ORDER — NYSTATIN 100000 UNIT/GM EX POWD
Freq: Two times a day (BID) | CUTANEOUS | Status: DC
  Administered 2014-05-04: 09:00:00 via TOPICAL
  Filled 2014-05-04: qty 15

## 2014-05-04 MED ORDER — FLUOXETINE HCL 20 MG PO CAPS: 20.0000 mg | ORAL_CAPSULE | Freq: Every day | ORAL | Status: DC

## 2014-05-04 MED ORDER — LORATADINE 10 MG PO TABS: 10.0000 mg | ORAL_TABLET | Freq: Every day | ORAL | Status: DC

## 2014-05-04 MED ORDER — AMLODIPINE BESYLATE 10 MG PO TABS: 10.0000 mg | ORAL_TABLET | Freq: Every day | ORAL | Status: DC

## 2014-05-04 MED ORDER — EMTRICITABINE-TENOFOVIR DF 200-300 MG PO TABS: 1.0000 | ORAL_TABLET | Freq: Every day | ORAL | Status: DC

## 2014-05-04 MED ORDER — BARRIER CREAM NON-SPECIFIED: 1.0000 "application " | TOPICAL_CREAM | Freq: Two times a day (BID) | TOPICAL | Status: DC | PRN

## 2014-05-04 MED ORDER — NYSTATIN 100000 UNIT/GM EX POWD: CUTANEOUS | Status: DC

## 2014-05-04 NOTE — Plan of Care (Signed)
Problem: Ineffective individual coping Goal: STG: Patient will remain free from self harm Outcome: Progressing Patient has remained free from self harm.

## 2014-05-04 NOTE — Progress Notes (Signed)
  [facility] Adult Case Management Discharge Plan :  Will you be returning to the same living situation after discharge:  Yes,  patient plans to return to a friend's home At discharge, do you have transportation home?: Yes,  patient will be provided with a bus pass Do you have the ability to pay for your medications: Yes,  patient will be provided with prescriptions at discharge  Release of information consent forms completed and in the chart;  Patient's signature needed at discharge.  Patient to Follow up at: Follow-up Information    Follow up with [facility].   Specialty:  Behavioral Health   Why:  Please present to [facility] walk-in clinic for assessment for therapy and medication management services. Please bring your insurance card (if you have one) and ID.    Contact information:   [place] [address] [phone]       Follow up with [facility].   Why:  Please present as a walk-in to resume services.    Contact information:   [address]  [place], [place] [address]  [phone] fax [phone]      Patient denies SI/HI: Yes,  denies    Safety Planning and Suicide Prevention discussed: Yes,  with patient  Have you used any form of tobacco in the last 30 days? (Cigarettes, Smokeless Tobacco, Cigars, and/or Pipes): Yes  Has patient been referred to the Quitline?: Yes, faxed on 05/04/14  Omri Bertran, Casimiro Needle 05/04/2014, 11:56 AM

## 2014-05-04 NOTE — Discharge Summary (Signed)
Physician Discharge Summary Note  Patient:  Ashley [name] is an 49 y.o., female MRN:  [id number] DOB:  1966-02-12 Patient phone:  [phone] (home)  Patient address:   [place] [address],  Total Time spent with patient: 30 minutes  Date of Admission:  04/30/2014 Date of Discharge: 05/04/2014  Reason for Admission:  Depression  Principal Problem: MDD (major depressive disorder), recurrent, severe, with psychosis Discharge Diagnoses: Patient Active Problem List   Diagnosis Date Noted  . MDD (major depressive disorder), recurrent, severe, with psychosis [F33.3] 04/30/2014  . Screening examination for venereal disease [Z11.3] 06/18/2013  . Encounter for long-term (current) use of other medications [Z79.899] 06/18/2013  . Elevated LFTs [R79.89] 01/13/2012  . Tuberculosis [A15.0] 12/19/2011  . Cigarette smoker [Z72.0] 12/19/2011  . DRUG ABUSE [IMO0002] 06/30/2009  . PERIPHERAL NEUROPATHY [G60.9] 06/16/2009  . HYPERTENSION [I10] 03/19/2009  . ABDOMINAL WALL HERNIA [K43.9] 03/19/2009  . BACK PAIN, CHRONIC [M54.9] 03/19/2009  . Human immunodeficiency virus (HIV) disease [B20] 03/18/2009  . Hepatitis C virus infection without hepatic coma [B19.20] 03/18/2009  . Type II or unspecified type diabetes mellitus without mention of complication, not stated as uncontrolled [E11.9] 03/18/2009  . SCHIZOPHRENIA [F20.9] 03/18/2009  . BIPOLAR AFFECTIVE DISORDER, DEPRESSED, SEVERE [F31.4] 03/18/2009    Musculoskeletal: Strength & Muscle Tone: within normal limits Gait & Station: normal Patient leans: N/A  Psychiatric Specialty Exam:  SEE SRA Physical Exam  Vitals reviewed. Psychiatric: She has a normal mood and affect. Her behavior is normal.    Review of Systems  All other systems reviewed and are negative.   Blood pressure 133/78, pulse 75, temperature 98.8 F (37.1 C), temperature source Oral, resp. rate 20, height 4\' 7"  (1.397 m), weight 49.896 kg (110 lb).Body  mass index is 25.57 kg/(m^2).   Past Medical History:  Past Medical History  Diagnosis Date  . HIV (human immunodeficiency virus infection)   . Hepatitis C   . Diabetes mellitus   . Anxiety   . Pinched nerve   . Scoliosis   . Hernia   . Ankle fracture   . Hypertension   . TB (tuberculosis)   . Depression     Past Surgical History  Procedure Laterality Date  . Cholecystectomy    . Cesarean section    . Hernia repair    . Liver biopsy     Family History:  Family History  Problem Relation Age of Onset  . Diabetes Mother   . Hypertension Mother   . Stroke Mother     died 2013/09/28  Social History:  History  Alcohol Use No     History  Drug Use  . Yes  . Special: Marijuana, Cocaine    History   Social History  . Marital Status: Single    Spouse Name: N/A  . Number of Children: N/A  . Years of Education: N/A   Social History Main Topics  . Smoking status: Current Every Day Smoker -- 1.00 packs/day for 35 years    Types: Cigarettes  . Smokeless tobacco: Never Used  . Alcohol Use: No  . Drug Use: Yes    Special: Marijuana, Cocaine  . Sexual Activity: Yes    Birth Control/ Protection: Condom     Comment: given condoms   Other Topics Concern  . None   Social History Narrative   Risk to Self: Is patient at risk for suicide?: Yes What has been your use of drugs/alcohol within the  last 12 months?: uses marijuana daily, 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, daily alcohol treatment prior to Saturday 2/27 Risk to Others:   Prior Inpatient Therapy:   Prior Outpatient Therapy:    Level of Care:  OP  Hospital Course:   Ashley [name], 49 year old female states she has been feeling depressed for several months, particularly after her mother passed away last 2022/10/22. States she has started hallucinating, and describes auditory hallucinations telling her to hurt herself and that she is worthless. States she has also had occasional visual hallucinations, "  like seeing a bug or something and then I look again and it is not there ". She has been experiencing increasingly frequent suicidal ideations, which at this time she describes as passive thoughts of rather being dead. At this time denies any plan or intention of hurting self. Describes severe depression/sadness, and describes positive neuro-vegetative symptoms of depression as below. She has been ruminative about not making peace with her mother before she passed away last year and not having been able to see her before she died.  Of note, patient states she has not been taking any of her medications ( psychiatric or otherwise) in several months. She used to be on Prozac and Seroquel and she states they were helping. Patient reports a history of substance dependence. She is smoking cannabis daily, and a history of cocaine abuse, although last used cocaine about 3 months ago. She has been drinking 2-3 beers per day up to last Saturday. At this time does not present with symptoms of alcohol withdrawal and does not appear to be in any acute withdrawal or distress.  Ashley [name] was admitted for MDD (major depressive disorder), recurrent, severe, with psychosis and crisis management.  She was treated discharged with the medications listed below under Medication List.  Medical problems were identified and treated as needed.  Home medications were restarted as appropriate.  Improvement was monitored by observation and Ashley [name] daily report of symptom reduction.  Emotional and mental status was monitored by daily self-inventory reports completed by Ashley [name] and clinical staff.         Ashley [name] was evaluated by the treatment team for stability and plans for continued recovery upon discharge.  Ashley [name] motivation was an integral factor for scheduling further treatment.  Employment, transportation, bed availability, health status, family support, and any pending legal issues were also  considered during her hospital stay.  She was offered further treatment options upon discharge including but not limited to Residential, Intensive Outpatient, and Outpatient treatment.  Ashley [name] will follow up with the services as listed below under Follow Up Information.     Upon completion of this admission the patient was both mentally and medically stable for discharge denying suicidal/homicidal ideation, auditory/visual/tactile hallucinations, delusional thoughts and paranoia.  Patient was advised to follow up with I&D doctor as she was restarted on her HIV meds.     Consults:  psychiatry  Significant Diagnostic Studies:  labs: per ED  Discharge Vitals:   Blood pressure 133/78, pulse 75, temperature 98.8 F (37.1 C), temperature source Oral, resp. rate 20, height 4\' 7"  (1.397 m), weight 49.896 kg (110 lb). Body mass index is 25.57 kg/(m^2). Lab Results:   Results for orders placed or performed during the hospital encounter of 04/30/14 (from the past 72 hour(s))  RPR     Status: None   Collection Time: 05/01/14  7:45 PM  Result Value  Ref Range   RPR Ser Ql Non Reactive Non Reactive    Comment: (NOTE) Performed At: [facility] [place], [place] [id number] Lindon Romp MD [id number] Performed at [facility]   Hepatitis panel, acute     Status: Abnormal   Collection Time: 05/01/14  7:45 PM  Result Value Ref Range   Hepatitis B Surface Ag NEGATIVE NEGATIVE   HCV Ab Reactive (A) NEGATIVE   Hep A IgM NON REACTIVE NON REACTIVE    Comment: (NOTE) Effective January 12, 2014, Hepatitis Acute Panel (test code 22940) will be revised to automatically reflex to the Hepatitis C Viral RNA, Quantitative, Real-Time PCR assay if the Hepatitis C antibody screening result is Reactive. This action is being taken to ensure that the CDC/USPSTF recommended HCV diagnostic algorithm with the appropriate test reflex needed for accurate  interpretation is followed.    Hep B C IgM NON REACTIVE NON REACTIVE    Comment: (NOTE) High levels of Hepatitis B Core IgM antibody are detectable during the acute stage of Hepatitis B. This antibody is used to differentiate current from past HBV infection. Performed at [facility]   HIV-1 RNA ultraquant reflex to gentyp+     Status: None   Collection Time: 05/01/14  7:45 PM  Result Value Ref Range   HIV1 RNA Quant, Log UNABLE TO CALCULATE log10copy/mL    Comment: (NOTE) Unable to calculate result since non-numeric result obtained for component test.    HIV-1 RNA BY PCR NFPR copies/mL    Comment: (NOTE) No frozen plasma received. Notified Lorenda Ishihara 05/04/2014 The reportable range for this assay is 20 to 10,000,000 copies HIV-1 RNA/mL. Performed at [facility]   Antinuclear Antibodies, IFA     Status: None   Collection Time: 05/01/14  7:45 PM  Result Value Ref Range   ANA Ab, IFA Negative     Comment: (NOTE)                                     Negative   <1:80                                     Borderline  1:80                                     Positive   >1:80 Performed At: [facility] [place], [place] [id number] Lindon Romp MD [id number] Performed at [facility]   Iron and TIBC     Status: None   Collection Time: 05/01/14  7:45 PM  Result Value Ref Range   Iron 63 42 - 145 ug/dL   TIBC 279 250 - 470 ug/dL   Saturation Ratios 23 20 - 55 %   UIBC 216 125 - 400 ug/dL    Comment: Performed at [place]     Status: None   Collection Time: 05/01/14  7:45 PM  Result Value Ref Range   Prothrombin Time 13.3 11.6 - 15.2 seconds   INR 1.00 0.00 - 1.49    Comment: Performed at [facility]  Hemoglobin A1c     Status: None   Collection Time: 05/01/14  7:45 PM  Result Value Ref Range   Hgb A1c MFr Bld 5.6 4.8 - 5.6 %    Comment: (NOTE)          Pre-diabetes: 5.7 - 6.4         Diabetes: >6.4         Glycemic control for adults with diabetes: <7.0    Mean Plasma Glucose 114 mg/dL    Comment: (NOTE) Performed At: [facility] [place], [place] [id number] Lindon Romp MD [id number] Performed at [facility]     Physical Findings: AIMS: Facial and Oral Movements Muscles of Facial Expression: None, normal Lips and Perioral Area: None, normal Jaw: None, normal Tongue: None, normal,Extremity Movements Upper (arms, wrists, hands, fingers): None, normal Lower (legs, knees, ankles, toes): None, normal, Trunk Movements Neck, shoulders, hips: None, normal, Overall Severity Severity of abnormal movements (highest score from questions above): None, normal Incapacitation due to abnormal movements: None, normal Patient's awareness of abnormal movements (rate only patient's report): No Awareness, Dental Status Current problems with teeth and/or dentures?: Yes (missing) Does patient usually wear dentures?: No  CIWA:  CIWA-Ar Total: 0 COWS:  COWS Total Score: 0   See Psychiatric Specialty Exam and Suicide Risk Assessment completed by Attending Physician prior to discharge.  Discharge destination:  Home  Is patient on multiple antipsychotic therapies at discharge:  No   Has Patient had three or more failed trials of antipsychotic monotherapy by history:  No  Recommended Plan for Multiple Antipsychotic Therapies: NA     Medication List    STOP taking these medications        cetirizine 10 MG tablet  Commonly known as:  ZYRTEC  Replaced by:  loratadine 10 MG tablet     cloNIDine 0.2 MG tablet  Commonly known as:  CATAPRES     HYDROcodone-acetaminophen 7.5-325 MG per tablet  Commonly known as:  NORCO      TAKE these medications      Indication   amLODipine 10 MG tablet  Commonly known as:  NORVASC  Take 1 tablet (10 mg total) by mouth at bedtime.   Indication:  High Blood  Pressure     barrier cream Crea  Commonly known as:  non-specified  Apply 1 application topically 2 (two) times daily as needed.   Indication:  skin irritation (perineal)     dolutegravir 50 MG tablet  Commonly known as:  TIVICAY  Take 1 tablet (50 mg total) by mouth daily.   Indication:  HIV Disease     emtricitabine-tenofovir 200-300 MG per tablet  Commonly known as:  TRUVADA  Take 1 tablet by mouth daily.   Indication:  HIV Disease     FLUoxetine 20 MG capsule  Commonly known as:  PROZAC  Take 1 capsule (20 mg total) by mouth daily.   Indication:  Major Depressive Disorder     loratadine 10 MG tablet  Commonly known as:  CLARITIN  Take 1 tablet (10 mg total) by mouth daily.   Indication:  Hayfever     nystatin 100000 UNIT/GM Powd  Apply to groin BID   Indication:  Skin Infection due to Candida Yeast     QUEtiapine 25 MG tablet  Commonly known as:  SEROQUEL  Take 1 tablet (25 mg total) by mouth 2 (two) times daily.   Indication:  Mood Stabilization     traZODone 100 MG tablet  Commonly known as:  DESYREL  Take 1 tablet (100  mg total) by mouth at bedtime.   Indication:  Trouble Sleeping           Follow-up Information    Follow up with [facility].   Specialty:  Behavioral Health   Why:  Please present to [facility] walk-in clinic for assessment for therapy and medication management services. Please bring your insurance card (if you have one) and ID.    Contact information:   [place] [address] [phone]       Follow up with [facility].   Why:  Please present as a walk-in to resume services.    Contact information:   [address]  [place], [place] [address]  [phone] fax [phone]      Follow up with [facility]    .   Why:  follow up care for Infectious Disease.  Re establish care.  May go as walk in.  Go there before 9 am so you can get in early.   Contact information:   [address] [place] [address] [phone]      Follow-up recommendations:  Activity:  as tol, diet as tol  Comments:  1.  Take all your medications as prescribed.              2.  Report any adverse side effects to outpatient provider.                       3.  Patient instructed to not use alcohol or illegal drugs while on prescription medicines.            4.  In the event of worsening symptoms, instructed patient to call 911, the crisis hotline or go to nearest emergency room for evaluation of symptoms.  Total Discharge Time:  30 min  Signed: Kerrie Buffalo MAY, AGNP-BC 05/04/2014, 6:05 PM   Patient seen, Suicide Assessment Completed.  Disposition Plan Reviewed

## 2014-05-04 NOTE — Clinical Social Work Note (Signed)
CSW spoke with [facility] social worker Ashley [name] [phone] to inform her of patient's discharge.  Ashley [name] reports that she is continuing to work with patient on housing arrangements.   Tilden Fossa, MSW, [place] Worker [facility] [phone]

## 2014-05-04 NOTE — Progress Notes (Signed)
D: Pt denies SI/HI/AV. Pt is pleasant and cooperative. Pt rates depression at a 5, anxiety at a 5, and Helplessness/hopelessness at a 2.  A: Pt was offered support and encouragement. Pt was given scheduled medications. Pt was encourage to attend groups. Q 15 minute checks were done for safety. Pt having some acid reflux so was given a PRN. Pt also emesis and felt it was due to her heartburn.  R:Pt attends groups and interacts well with peers and staff. Pt taking medication. Pt has no complaints.Pt receptive to treatment and safety maintained on unit.

## 2014-05-04 NOTE — Progress Notes (Addendum)
Pt is D/C home. Pt denies SI/HI/AV. Pt follow-up and medications were reviewed and pt verbalized understanding. Pt pleasant and cooperative. Pt belongings were returned.

## 2014-05-04 NOTE — BHH Suicide Risk Assessment (Signed)
[facility] Discharge Suicide Risk Assessment   Demographic Factors:  49 year old single female, lives with a friend, has two adult children  Total Time spent with patient: 30 minutes  Musculoskeletal: Strength & Muscle Tone: within normal limits Gait & Station: normal Patient leans: N/A  Psychiatric Specialty Exam: Physical Exam  ROS  Blood pressure 133/78, pulse 75, temperature 98.8 F (37.1 C), temperature source Oral, resp. rate 20, height 4\' 7"  (1.397 m), weight 110 lb (49.896 kg).Body mass index is 25.57 kg/(m^2).  General Appearance: improved grooming  Eye Contact::  Good  Speech:  improved   Volume:  Normal  Mood:  mood is improved, and reports her depression is " a lot better"  Affect:  still constricted but reactive   Thought Process:  Goal Directed and Linear  Orientation:  Full (Time, Place, and Person)  Thought Content:  no hallucinations,no delusions   Suicidal Thoughts:  No denies any suicidal or homicidal ideations and  Contracts for safety on the unit   Homicidal Thoughts:  No  Memory:  recent and remote grossly intact   Judgement:  Other:  improved  Insight:  Present  Psychomotor Activity:  Normal  Concentration:  Good  Recall:  Good  Fund of Knowledge:Good  Language: Good  Akathisia:  Negative  Handed:  Left  AIMS (if indicated):     Assets:  Desire for Improvement Resilience  Sleep:  Number of Hours: 6.75  Cognition: WNL  ADL's:  Improved    Have you used any form of tobacco in the last 30 days? (Cigarettes, Smokeless Tobacco, Cigars, and/or Pipes): Yes  Has this patient used any form of tobacco in the last 30 days? (Cigarettes, Smokeless Tobacco, Cigars, and/or Pipes) patient advised regarding benefits of smoking cessation and will provide script for nicotine replacement patches on discharge, at her request .  Mental Status Per Nursing Assessment::   On Admission:     Current Mental Status by Physician: At this time patient is improved compared to  admission, with an improved mood, improved range of affect, no suicidal ideations,no homicidal ideations, and no psychotic symptoms. She is tolerating medications well at this time.  Loss Factors: No source of income, chronic medical illnesses, limited support system  Historical Factors: Prior psychiatric admissions for depression, history of cocaine dependence ( now in early remission)   Risk Reduction Factors:   Sense of responsibility to family and Positive coping skills or problem solving skills  Continued Clinical Symptoms:  As above, mood partially but significantly improved compared to admission. No SI or HI, no psychotic symptoms. Behavior calm and in good control.  Cognitive Features That Contribute To Risk:  Fully alert and oriented x 3 at time of discharge   Suicide Risk:  Mild:  Suicidal ideation of limited frequency, intensity, duration, and specificity.  There are no identifiable plans, no associated intent, mild dysphoria and related symptoms, good self-control (both objective and subjective assessment), few other risk factors, and identifiable protective factors, including available and accessible social support.  Principal Problem: MDD (major depressive disorder), recurrent, severe, with psychosis Discharge Diagnoses:  Patient Active Problem List   Diagnosis Date Noted  . MDD (major depressive disorder), recurrent, severe, with psychosis [F33.3] 04/30/2014  . Screening examination for venereal disease [Z11.3] 06/18/2013  . Encounter for long-term (current) use of other medications [Z79.899] 06/18/2013  . Elevated LFTs [R79.89] 01/13/2012  . Tuberculosis [A15.0] 12/19/2011  . Cigarette smoker [Z72.0] 12/19/2011  . DRUG ABUSE [IMO0002] 06/30/2009  . PERIPHERAL NEUROPATHY [  G60.9] 06/16/2009  . HYPERTENSION [I10] 03/19/2009  . ABDOMINAL WALL HERNIA [K43.9] 03/19/2009  . BACK PAIN, CHRONIC [M54.9] 03/19/2009  . Human immunodeficiency virus (HIV) disease [B20] 03/18/2009   . Hepatitis C virus infection without hepatic coma [B19.20] 03/18/2009  . Type II or unspecified type diabetes mellitus without mention of complication, not stated as uncontrolled [E11.9] 03/18/2009  . SCHIZOPHRENIA [F20.9] 03/18/2009  . BIPOLAR AFFECTIVE DISORDER, DEPRESSED, SEVERE [F31.4] 03/18/2009    Follow-up Information    Follow up with [facility].   Specialty:  Behavioral Health   Why:  Please present to [facility] walk-in clinic for assessment for therapy and medication management services. Please bring your insurance card (if you have one) and ID.    Contact information:   [place] [address] [phone]       Follow up with [facility].   Why:  Please present as a walk-in to resume services.    Contact information:   [address]  [place], [place] [address]  [phone] fax [phone]      Plan Of Care/Follow-up recommendations:  Activity:  As tolerated Diet:  Low sodium, heart healthy, diabetic diet  Tests:  NA Other:  See below  Is patient on multiple antipsychotic therapies at discharge:  No   Has Patient had three or more failed trials of antipsychotic monotherapy by history:  No  Recommended Plan for Multiple Antipsychotic Therapies: NA   Patient plans to return to live with her friend, where she was living prior to admission. Follow up as above. In regards to HIV management, will follow up at [facility]. Also plans to follow up at Urgent Care for ongoing management.     COBOS, FERNANDO 05/04/2014, 11:34 AM

## 2014-05-04 NOTE — Progress Notes (Signed)
Writer has observed patient up in the dayroom watching tv and minimal interaction with peers. She c/o having pain in her right groin area and requested medication for pain which she received. Patient reports that she had spoke to her doctor about this and an appointment is supposed to be scheduled. Writer encouraged fluids. She is compliant with scheduled medications and attended group this evening. She denies si/hi/ and reports auditory and visual hallucinations. Support and encouragement given, safety maintained on unit with 15 min checks.

## 2014-05-05 LAB — T-HELPER CELLS (CD4) COUNT (NOT AT ARMC)
CD4 % Helper T Cell: 29 % — ABNORMAL LOW (ref 33–55)
CD4 T Cell Abs: 270 /uL — ABNORMAL LOW (ref 400–2700)

## 2014-05-06 LAB — HEPATITIS C VRS RNA DETECT BY PCR-QUAL: Hepatitis C Vrs RNA by PCR-Qual: POSITIVE — AB

## 2014-05-06 NOTE — Progress Notes (Signed)
Patient Discharge Instructions:  After Visit Summary (AVS):   Faxed to:  05/06/14 Discharge Summary Note:   Faxed to:  05/06/14 Psychiatric Admission Assessment Note:   Faxed to:  05/06/14 Suicide Risk Assessment - Discharge Assessment:   Faxed to:  05/06/14 Faxed/Sent to the Next [facility] Care provider:  05/06/14 Faxed to [employer] @ [phone] Faxed to [facility] @ [phone] No documentation was faxed to [place] for [facility]IPS.  No ROI is available.  [name], 05/06/2014, 3:48 PM

## 2014-05-07 LAB — HIV-1 INTEGRASE GENOTYPE
Date Viral Load Collected: NO GROWTH
VALUE LAST VIRAL LOAD: NO GROWTH

## 2014-05-07 LAB — HEPATITIS C GENOTYPE

## 2014-05-09 ENCOUNTER — Emergency Department (INDEPENDENT_AMBULATORY_CARE_PROVIDER_SITE_OTHER)
Admission: EM | Admit: 2014-05-09 | Discharge: 2014-05-09 | Disposition: A | Discharge status: Home / Self Care | Payer: Medicaid Other | Source: Home / Self Care

## 2014-05-09 ENCOUNTER — Encounter (HOSPITAL_COMMUNITY): Payer: Self-pay | Admitting: Emergency Medicine

## 2014-05-09 ENCOUNTER — Emergency Department (HOSPITAL_COMMUNITY)
Admission: EM | Admit: 2014-05-09 | Discharge: 2014-05-09 | Disposition: A | Discharge status: Home / Self Care | Payer: Medicaid Other | Attending: Emergency Medicine | Admitting: Emergency Medicine

## 2014-05-09 ENCOUNTER — Emergency Department (HOSPITAL_COMMUNITY): Payer: Medicaid Other

## 2014-05-09 DIAGNOSIS — Z720 Tobacco use: Secondary | ICD-10-CM | POA: Insufficient documentation

## 2014-05-09 DIAGNOSIS — R1011 Right upper quadrant pain: Secondary | ICD-10-CM | POA: Insufficient documentation

## 2014-05-09 DIAGNOSIS — Z203 Contact with and (suspected) exposure to rabies: Secondary | ICD-10-CM

## 2014-05-09 DIAGNOSIS — Z8781 Personal history of (healed) traumatic fracture: Secondary | ICD-10-CM | POA: Insufficient documentation

## 2014-05-09 DIAGNOSIS — Z8669 Personal history of other diseases of the nervous system and sense organs: Secondary | ICD-10-CM | POA: Insufficient documentation

## 2014-05-09 DIAGNOSIS — M25571 Pain in right ankle and joints of right foot: Secondary | ICD-10-CM | POA: Diagnosis not present

## 2014-05-09 DIAGNOSIS — J45909 Unspecified asthma, uncomplicated: Secondary | ICD-10-CM | POA: Insufficient documentation

## 2014-05-09 DIAGNOSIS — M79604 Pain in right leg: Secondary | ICD-10-CM | POA: Insufficient documentation

## 2014-05-09 DIAGNOSIS — I10 Essential (primary) hypertension: Secondary | ICD-10-CM | POA: Diagnosis not present

## 2014-05-09 DIAGNOSIS — Z21 Asymptomatic human immunodeficiency virus [HIV] infection status: Secondary | ICD-10-CM | POA: Insufficient documentation

## 2014-05-09 DIAGNOSIS — F419 Anxiety disorder, unspecified: Secondary | ICD-10-CM | POA: Diagnosis not present

## 2014-05-09 DIAGNOSIS — Z79899 Other long term (current) drug therapy: Secondary | ICD-10-CM | POA: Diagnosis not present

## 2014-05-09 DIAGNOSIS — Z8611 Personal history of tuberculosis: Secondary | ICD-10-CM | POA: Insufficient documentation

## 2014-05-09 DIAGNOSIS — Z9089 Acquired absence of other organs: Secondary | ICD-10-CM | POA: Diagnosis not present

## 2014-05-09 DIAGNOSIS — Z9049 Acquired absence of other specified parts of digestive tract: Secondary | ICD-10-CM | POA: Insufficient documentation

## 2014-05-09 DIAGNOSIS — R1031 Right lower quadrant pain: Secondary | ICD-10-CM | POA: Insufficient documentation

## 2014-05-09 DIAGNOSIS — R1032 Left lower quadrant pain: Secondary | ICD-10-CM | POA: Insufficient documentation

## 2014-05-09 DIAGNOSIS — E119 Type 2 diabetes mellitus without complications: Secondary | ICD-10-CM | POA: Diagnosis not present

## 2014-05-09 DIAGNOSIS — F329 Major depressive disorder, single episode, unspecified: Secondary | ICD-10-CM | POA: Diagnosis not present

## 2014-05-09 DIAGNOSIS — R109 Unspecified abdominal pain: Secondary | ICD-10-CM

## 2014-05-09 DIAGNOSIS — M419 Scoliosis, unspecified: Secondary | ICD-10-CM | POA: Insufficient documentation

## 2014-05-09 HISTORY — DX: Systemic involvement of connective tissue, unspecified: M35.9

## 2014-05-09 HISTORY — DX: Unspecified asthma, uncomplicated: J45.909

## 2014-05-09 LAB — CBC WITH DIFFERENTIAL/PLATELET
BASOS ABS: 0 10*3/uL (ref 0.0–0.1)
BASOS PCT: 0 % (ref 0–1)
EOS PCT: 2 % (ref 0–5)
Eosinophils Absolute: 0.1 10*3/uL (ref 0.0–0.7)
HCT: 35.9 % — ABNORMAL LOW (ref 36.0–46.0)
Hemoglobin: 12.8 g/dL (ref 12.0–15.0)
LYMPHS PCT: 49 % — AB (ref 12–46)
Lymphs Abs: 1.9 10*3/uL (ref 0.7–4.0)
MCH: 32.2 pg (ref 26.0–34.0)
MCHC: 35.7 g/dL (ref 30.0–36.0)
MCV: 90.4 fL (ref 78.0–100.0)
MONO ABS: 0.5 10*3/uL (ref 0.1–1.0)
Monocytes Relative: 12 % (ref 3–12)
NEUTROS PCT: 37 % — AB (ref 43–77)
Neutro Abs: 1.4 10*3/uL — ABNORMAL LOW (ref 1.7–7.7)
Platelets: 148 10*3/uL — ABNORMAL LOW (ref 150–400)
RBC: 3.97 MIL/uL (ref 3.87–5.11)
RDW: 13.6 % (ref 11.5–15.5)
WBC: 3.9 10*3/uL — AB (ref 4.0–10.5)

## 2014-05-09 LAB — I-STAT CHEM 8, ED
BUN: 13 mg/dL (ref 6–23)
Calcium, Ion: 1.22 mmol/L (ref 1.12–1.23)
Chloride: 102 mmol/L (ref 96–112)
Creatinine, Ser: 1.3 mg/dL — ABNORMAL HIGH (ref 0.50–1.10)
GLUCOSE: 88 mg/dL (ref 70–99)
HCT: 39 % (ref 36.0–46.0)
HEMOGLOBIN: 13.3 g/dL (ref 12.0–15.0)
Potassium: 4.3 mmol/L (ref 3.5–5.1)
Sodium: 140 mmol/L (ref 135–145)
TCO2: 24 mmol/L (ref 0–100)

## 2014-05-09 LAB — COMPREHENSIVE METABOLIC PANEL
ALBUMIN: 4.1 g/dL (ref 3.5–5.2)
ALT: 67 U/L — AB (ref 0–35)
ANION GAP: 7 (ref 5–15)
AST: 62 U/L — ABNORMAL HIGH (ref 0–37)
Alkaline Phosphatase: 71 U/L (ref 39–117)
BUN: 10 mg/dL (ref 6–23)
CHLORIDE: 103 mmol/L (ref 96–112)
CO2: 29 mmol/L (ref 19–32)
CREATININE: 1.35 mg/dL — AB (ref 0.50–1.10)
Calcium: 9.6 mg/dL (ref 8.4–10.5)
GFR calc Af Amer: 52 mL/min — ABNORMAL LOW (ref >90)
GFR, EST NON AFRICAN AMERICAN: 45 mL/min — AB (ref >90)
Glucose, Bld: 87 mg/dL (ref 70–99)
Potassium: 4.2 mmol/L (ref 3.5–5.1)
SODIUM: 139 mmol/L (ref 135–145)
Total Bilirubin: 0.6 mg/dL (ref 0.3–1.2)
Total Protein: 7.8 g/dL (ref 6.0–8.3)

## 2014-05-09 LAB — URINALYSIS, ROUTINE W REFLEX MICROSCOPIC
Bilirubin Urine: NEGATIVE
Glucose, UA: NEGATIVE mg/dL
Hgb urine dipstick: NEGATIVE
Ketones, ur: 15 mg/dL — AB
Nitrite: NEGATIVE
Protein, ur: NEGATIVE mg/dL
Specific Gravity, Urine: 1.026 (ref 1.005–1.030)
Urobilinogen, UA: 1 mg/dL (ref 0.0–1.0)
pH: 7 (ref 5.0–8.0)

## 2014-05-09 LAB — URINE MICROSCOPIC-ADD ON

## 2014-05-09 LAB — LIPASE, BLOOD: Lipase: 45 U/L (ref 11–59)

## 2014-05-09 MED ORDER — IOHEXOL 300 MG/ML  SOLN
80.0000 mL | Freq: Once | INTRAMUSCULAR | Status: AC | PRN
  Administered 2014-05-09: 80 mL via INTRAVENOUS

## 2014-05-09 MED ORDER — ONDANSETRON 4 MG PO TBDP
4.0000 mg | ORAL_TABLET | Freq: Once | ORAL | Status: AC
  Administered 2014-05-09: 4 mg via ORAL

## 2014-05-09 MED ORDER — IOHEXOL 300 MG/ML  SOLN
25.0000 mL | INTRAMUSCULAR | Status: AC
  Administered 2014-05-09: 25 mL via ORAL

## 2014-05-09 MED ORDER — RABIES VACCINE, PCEC IM SUSR
1.0000 mL | Freq: Once | INTRAMUSCULAR | Status: AC
  Administered 2014-05-09: 1 mL via INTRAMUSCULAR

## 2014-05-09 MED ORDER — DICYCLOMINE HCL 20 MG PO TABS: 20.0000 mg | ORAL_TABLET | Freq: Two times a day (BID) | ORAL | Status: DC | PRN

## 2014-05-09 MED ORDER — POLYETHYLENE GLYCOL 3350 17 G PO PACK: 17.0000 g | PACK | Freq: Every day | ORAL | Status: DC

## 2014-05-09 MED ORDER — RABIES VACCINE, PCEC IM SUSR
INTRAMUSCULAR | Status: AC
  Filled 2014-05-09: qty 1

## 2014-05-09 MED ORDER — SODIUM CHLORIDE 0.9 % IV BOLUS (SEPSIS)
1000.0000 mL | Freq: Once | INTRAVENOUS | Status: AC
  Administered 2014-05-09: 1000 mL via INTRAVENOUS

## 2014-05-09 MED ORDER — MORPHINE SULFATE 4 MG/ML IJ SOLN
4.0000 mg | Freq: Once | INTRAMUSCULAR | Status: AC
  Administered 2014-05-09: 4 mg via INTRAVENOUS
  Filled 2014-05-09: qty 1

## 2014-05-09 NOTE — ED Notes (Signed)
Pt is here for 3rd series of rabies vaccination Was released from [facility] ER today around 0700 she states Alert, no signs of acuate distress.

## 2014-05-09 NOTE — Discharge Instructions (Signed)
Please comeback on 3/19 for 4th rabies vaccination

## 2014-05-09 NOTE — ED Notes (Signed)
Per dr. Georgina Snell, ok to give 3rd shot; should come back in 7 days for last series.

## 2014-05-09 NOTE — Discharge Instructions (Signed)
Abdominal Pain, Women °Abdominal (stomach, pelvic, or belly) pain can be caused by many things. It is important to tell your doctor: °· The location of the pain. °· Does it come and go or is it present all the time? °· Are there things that start the pain (eating certain foods, exercise)? °· Are there other symptoms associated with the pain (fever, nausea, vomiting, diarrhea)? °All of this is helpful to know when trying to find the cause of the pain. °CAUSES  °· Stomach: virus or bacteria infection, or ulcer. °· Intestine: appendicitis (inflamed appendix), regional ileitis (Crohn's disease), ulcerative colitis (inflamed colon), irritable bowel syndrome, diverticulitis (inflamed diverticulum of the colon), or cancer of the stomach or intestine. °· Gallbladder disease or stones in the gallbladder. °· Kidney disease, kidney stones, or infection. °· Pancreas infection or cancer. °· Fibromyalgia (pain disorder). °· Diseases of the female organs: °¨ Uterus: fibroid (non-cancerous) tumors or infection. °¨ Fallopian tubes: infection or tubal pregnancy. °¨ Ovary: cysts or tumors. °¨ Pelvic adhesions (scar tissue). °¨ Endometriosis (uterus lining tissue growing in the pelvis and on the pelvic organs). °¨ Pelvic congestion syndrome (female organs filling up with blood just before the menstrual period). °¨ Pain with the menstrual period. °¨ Pain with ovulation (producing an egg). °¨ Pain with an IUD (intrauterine device, birth control) in the uterus. °¨ Cancer of the female organs. °· Functional pain (pain not caused by a disease, may improve without treatment). °· Psychological pain. °· Depression. °DIAGNOSIS  °Your doctor will decide the seriousness of your pain by doing an examination. °· Blood tests. °· X-rays. °· Ultrasound. °· CT scan (computed tomography, special type of X-ray). °· MRI (magnetic resonance imaging). °· Cultures, for infection. °· Barium enema (dye inserted in the large intestine, to better view it with  X-rays). °· Colonoscopy (looking in intestine with a lighted tube). °· Laparoscopy (minor surgery, looking in abdomen with a lighted tube). °· Major abdominal exploratory surgery (looking in abdomen with a large incision). °TREATMENT  °The treatment will depend on the cause of the pain.  °· Many cases can be observed and treated at home. °· Over-the-counter medicines recommended by your caregiver. °· Prescription medicine. °· Antibiotics, for infection. °· Birth control pills, for painful periods or for ovulation pain. °· Hormone treatment, for endometriosis. °· Nerve blocking injections. °· Physical therapy. °· Antidepressants. °· Counseling with a psychologist or psychiatrist. °· Minor or major surgery. °HOME CARE INSTRUCTIONS  °· Do not take laxatives, unless directed by your caregiver. °· Take over-the-counter pain medicine only if ordered by your caregiver. Do not take aspirin because it can cause an upset stomach or bleeding. °· Try a clear liquid diet (broth or water) as ordered by your caregiver. Slowly move to a bland diet, as tolerated, if the pain is related to the stomach or intestine. °· Have a thermometer and take your temperature several times a day, and record it. °· Bed rest and sleep, if it helps the pain. °· Avoid sexual intercourse, if it causes pain. °· Avoid stressful situations. °· Keep your follow-up appointments and tests, as your caregiver orders. °· If the pain does not go away with medicine or surgery, you may try: °¨ Acupuncture. °¨ Relaxation exercises (yoga, meditation). °¨ Group therapy. °¨ Counseling. °SEEK MEDICAL CARE IF:  °· You notice certain foods cause stomach pain. °· Your home care treatment is not helping your pain. °· You need stronger pain medicine. °· You want your IUD removed. °· You feel faint or   lightheaded. °· You develop nausea and vomiting. °· You develop a rash. °· You are having side effects or an allergy to your medicine. °SEEK IMMEDIATE MEDICAL CARE IF:  °· Your  pain does not go away or gets worse. °· You have a fever. °· Your pain is felt only in portions of the abdomen. The right side could possibly be appendicitis. The left lower portion of the abdomen could be colitis or diverticulitis. °· You are passing blood in your stools (bright red or black tarry stools, with or without vomiting). °· You have blood in your urine. °· You develop chills, with or without a fever. °· You pass out. °MAKE SURE YOU:  °· Understand these instructions. °· Will watch your condition. °· Will get help right away if you are not doing well or get worse. °Document Released: 12/11/2006 Document Revised: 06/30/2013 Document Reviewed: 12/31/2008 °ExitCare® Patient Information ©2015 [employer]. This information is not intended to replace advice given to you by your health care provider. Make sure you discuss any questions you have with your health care provider. ° °

## 2014-05-09 NOTE — ED Provider Notes (Signed)
CSN: [id number]     Arrival date & time 05/09/14  0059 History  This chart was scribed for Julianne Rice, MD by Delphia Grates, ED Scribe. This patient was seen in room B18C/B18C and the patient's care was started at 3:06 AM.   Chief Complaint  Patient presents with  . Abdominal Pain    The history is provided by the patient. No language interpreter was used.    HPI Comments: Ashley [name] is a 49 y.o. female, with history of DM, Hepatitis C, and HIV, who presents to the Emergency Department complaining of intermittent, nonradiating, 10/10, RLQ abdominal pain for the past 4 months that has gotten worse in the last 24 hours. Patient states she is still having pain at this time and reports this current episode of pain started 6-7 hours ago. Patient last ate 9 hours ago, and reports her last normal BM was last night. She reports past surgical history of cholecystectomy and hernia repair. However, patient was informed she has another hernia. She denies history of appendectomy. She is also complaining of RLE pain that has been ongoing for the past 3 months. She denies nausea, vomiting, diarrhea, fever, chills.   PCP: Criss Rosales  Past Medical History  Diagnosis Date  . HIV (human immunodeficiency virus infection)   . Hepatitis C   . Diabetes mellitus   . Anxiety   . Pinched nerve   . Scoliosis   . Hernia   . Ankle fracture   . Hypertension   . TB (tuberculosis)   . Depression   . Asthma   . Collagen vascular disease    Past Surgical History  Procedure Laterality Date  . Cholecystectomy    . Cesarean section    . Hernia repair    . Liver biopsy     Family History  Problem Relation Age of Onset  . Diabetes Mother   . Hypertension Mother   . Stroke Mother     died 10-06-13  History  Substance Use Topics  . Smoking status: Current Every Day Smoker -- 1.00 packs/day for 35 years    Types: Cigarettes  . Smokeless tobacco: Never Used  . Alcohol Use: No   OB History     Gravida Para Term Preterm AB TAB SAB Ectopic Multiple Living   2 2        2      Review of Systems  Constitutional: Negative for fever and chills.  Respiratory: Negative for cough and shortness of breath.   Cardiovascular: Negative for chest pain.  Gastrointestinal: Positive for abdominal pain. Negative for nausea, vomiting, diarrhea, constipation and blood in stool.  Genitourinary: Negative for dysuria, frequency, hematuria, flank pain and pelvic pain.  Musculoskeletal: Positive for myalgias. Negative for back pain, neck pain and neck stiffness.  Skin: Negative for rash and wound.  Neurological: Negative for dizziness, weakness, light-headedness, numbness and headaches.  All other systems reviewed and are negative.     Allergies  Review of patient's allergies indicates no known allergies.  Home Medications   Prior to Admission medications   Medication Sig Start Date End Date Taking? Authorizing Provider  acetaminophen (TYLENOL) 325 MG tablet Take 650 mg by mouth every 6 (six) hours as needed for mild pain.   Yes Historical Provider, MD  amLODipine (NORVASC) 10 MG tablet Take 1 tablet (10 mg total) by mouth at bedtime. 05/04/14  Yes Kerrie Buffalo, NP  barrier cream (NON-SPECIFIED) CREA Apply 1 application topically 2 (two) times daily as needed.  05/04/14  Yes Kerrie Buffalo, NP  dolutegravir (TIVICAY) 50 MG tablet Take 1 tablet (50 mg total) by mouth daily. 05/04/14  Yes Kerrie Buffalo, NP  emtricitabine-tenofovir (TRUVADA) 200-300 MG per tablet Take 1 tablet by mouth daily. 05/04/14  Yes Kerrie Buffalo, NP  FLUoxetine (PROZAC) 20 MG capsule Take 1 capsule (20 mg total) by mouth daily. 05/04/14  Yes Kerrie Buffalo, NP  loratadine (CLARITIN) 10 MG tablet Take 1 tablet (10 mg total) by mouth daily. 05/04/14  Yes Kerrie Buffalo, NP  nystatin (MYCOSTATIN/NYSTOP) 100000 UNIT/GM POWD Apply to groin BID 05/04/14  Yes Kerrie Buffalo, NP  QUEtiapine (SEROQUEL) 25 MG tablet Take 1 tablet (25 mg total) by  mouth 2 (two) times daily. 05/04/14  Yes Kerrie Buffalo, NP  traMADol (ULTRAM) 50 MG tablet Take 50 mg by mouth every 6 (six) hours as needed for moderate pain.   Yes Historical Provider, MD  traZODone (DESYREL) 100 MG tablet Take 1 tablet (100 mg total) by mouth at bedtime. 05/04/14  Yes Kerrie Buffalo, NP  dicyclomine (BENTYL) 20 MG tablet Take 1 tablet (20 mg total) by mouth 2 (two) times daily as needed for spasms. 05/09/14   Julianne Rice, MD  polyethylene glycol [facility] / Floria Raveling) packet Take 17 g by mouth daily. 05/09/14   Julianne Rice, MD   Triage Vitals: BP 122/83 mmHg  Pulse 81  Temp(Src) 99 F (37.2 C) (Oral)  Resp 16  Ht 4\' 8"  (1.422 m)  Wt 115 lb (52.164 kg)  BMI 25.80 kg/m2  SpO2 99%  Physical Exam  Constitutional: She is oriented to person, place, and time. She appears well-developed and well-nourished. No distress.  HENT:  Head: Normocephalic and atraumatic.  Mouth/Throat: Oropharynx is clear and moist.  Eyes: Conjunctivae and EOM are normal. Pupils are equal, round, and reactive to light.  Neck: Normal range of motion. Neck supple. No tracheal deviation present.  Cardiovascular: Normal rate and regular rhythm.   Pulmonary/Chest: Effort normal and breath sounds normal. No respiratory distress. She has no wheezes. She has no rales.  Abdominal: Soft. Bowel sounds are normal. She exhibits no distension and no mass. There is tenderness (tendernessto palpation in theright upper quadrant and the left lower quadrant.there is no rebound or guarding.). There is no rebound and no guarding.  Musculoskeletal: Normal range of motion. She exhibits no edema or tenderness.  Mild bilateral flank tenderness with percussion. Next  Patient also has right ankle pain. There is no obvious swelling or deformity. No calf tenderness.  Neurological: She is alert and oriented to person, place, and time.  5/5 motor in all extremities. Sensation is fully intact.  Skin: Skin is warm and dry. No  rash noted. No erythema.  Psychiatric: She has a normal mood and affect. Her behavior is normal.  Nursing note and vitals reviewed.   ED Course  Procedures (including critical care time)  DIAGNOSTIC STUDIES: Oxygen Saturation is 99% on room air, normal by my interpretation.    COORDINATION OF CARE: At 0310 Discussed treatment plan with patient which includes imaging. Patient agrees.   Labs Review Labs Reviewed  CBC WITH DIFFERENTIAL/PLATELET - Abnormal; Notable for the following:    WBC 3.9 (*)    HCT 35.9 (*)    Platelets 148 (*)    Neutrophils Relative % 37 (*)    Neutro Abs 1.4 (*)    Lymphocytes Relative 49 (*)    All other components within normal limits  URINALYSIS, ROUTINE W REFLEX MICROSCOPIC - Abnormal; Notable for the  following:    Ketones, ur 15 (*)    Leukocytes, UA SMALL (*)    All other components within normal limits  COMPREHENSIVE METABOLIC PANEL - Abnormal; Notable for the following:    Creatinine, Ser 1.35 (*)    AST 62 (*)    ALT 67 (*)    GFR calc non Af Amer 45 (*)    GFR calc Af Amer 52 (*)    All other components within normal limits  URINE MICROSCOPIC-ADD ON - Abnormal; Notable for the following:    Squamous Epithelial / LPF FEW (*)    Casts HYALINE CASTS (*)    All other components within normal limits  I-STAT CHEM 8, ED - Abnormal; Notable for the following:    Creatinine, Ser 1.30 (*)    All other components within normal limits  LIPASE, BLOOD    Imaging Review Ct Abdomen Pelvis W Contrast  05/09/2014   CLINICAL DATA:  Right upper quadrant pain. Intermittent non radiating right lower quadrant pain for 4 months, increasing over 24 hours.  EXAM: CT ABDOMEN AND PELVIS WITH CONTRAST  TECHNIQUE: Multidetector CT imaging of the abdomen and pelvis was performed using the standard protocol following bolus administration of intravenous contrast.  CONTRAST:  59mL OMNIPAQUE IOHEXOL 300 MG/ML  SOLN  COMPARISON:  CT 01/18/2010  FINDINGS: The included lung  bases are clear.  Clips in the gallbladder fossa from cholecystectomy. Common bile duct is normal in caliber measuring 6 mm, there is mild central intrahepatic biliary dilatation. No focal hepatic lesion.  Mild prominence of the main pancreatic duct measuring 3.5 mm. No peripancreatic inflammatory change or focal lesion.  The spleen and adrenal glands are normal. The kidneys demonstrate symmetric enhancement and excretion without hydronephrosis or localizing renal abnormality.  Stomach is physiologically distended with contrast. There are no dilated or thickened bowel loops. No obstruction, there is oral contrast to the mid transverse colon. Moderate volume of colonic stool. No bowel inflammation. No free air, free fluid, or intra-abdominal fluid collection.  The appendix is air-filled and normal.  The abdominal aorta is normal in caliber with mild atherosclerosis. No retroperitoneal adenopathy. No mesenteric adenopathy. There is laxity of the anterior abdominal wall with unchanged bulging at the umbilicus.  The bladder is physiologically distended. The uterus is retroverted. The ovaries are not discretely identified, no adnexal mass. No pelvic free fluid.  There is degenerative change at L5-S1 with primarily facet arthropathy. No acute or suspicious osseous abnormality.  IMPRESSION: 1. Minimal intrahepatic biliary ductal dilatation, with normal caliber of the common bile duct. This may be normal for this patient. Patient is postcholecystectomy. MRCP could be considered if patient is able to tolerate breath hold technique for further evaluation of the biliary tree based on clinical concern. 2. No acute abnormality in the abdomen/pelvis. 3. Moderate volume of colonic stool.   Electronically Signed   By: Jeb Levering M.D.   On: 05/09/2014 06:25     EKG Interpretation None      MDM   Final diagnoses:  Abdominal pain    I personally performed the services described in this documentation, which was  scribed in my presence. The recorded information has been reviewed and is accurate.  Discussed CT results with the patient. Recommended follow-up with gastroenterology. Advised to start on MiraLAX. Return precautions given. Repeat abdominal exam, abdomen soft and nontender.   Julianne Rice, MD 05/10/14 0730

## 2014-05-09 NOTE — ED Notes (Signed)
Patient "spitting out" mucous  Dr Lita Mains ordered Zofran to give prior to leaving

## 2014-05-09 NOTE — ED Notes (Signed)
Pt c/o RUQ pain for a few days.  Pain in right groin x's 1 week and pain in mid back onset earlier today.  Pt denies nausea or vomiting

## 2014-06-20 NOTE — Discharge Summary (Signed)
PATIENT NAME:  Ashley [name], Ashley [name] MR#:  [id number] DATE OF BIRTH:  1965/07/22  DATE OF ADMISSION:  10/14/2013 DATE OF DISCHARGE:  10/17/2013  DISCHARGE DIAGNOSES:  1.  Escherichia coli urinary tract infection.  2.  Pyelonephritis due to Escherichia coli urinary tract infection.  3.  Human immunodeficiency virus.  4.  Hypertension.  5.  Type 2 diabetes mellitus.  6.  Hepatitis C.   DISCHARGE MEDICATIONS: 1.  Truvada 200/300 mg p.o. daily.  2.  Flonase 50 mcg 1 spray daily.  3.  Isentress 400 mg p.o. daily.  4.  Hydrochlorothiazide 25 mg p.o. daily. 5.  Clonidine 0.2 mg p.o. t.i.d.  6.  Amlodipine 10 mg p.o. daily.  7.  Prozac 40 mg p.o. daily.  8.  Cipro 500 mg every 12 hours. The patient was given Cipro 500 p.o. b.i.d. for 10 days.   DIET: Low-sodium diet.   CONSULTATIONS: None.   HOSPITAL COURSE: The patient is a 49 year old pleasant African-American female with HIV, comes in because of abdominal pain and burning urination. Look at the history and physical for full details. The patient had left-sided abdominal pain, burning urination going on for a few days. The patient's temperature was 102.8 in the Emergency Room. Her UA showed 1+ leukocytes, WBC 52, abdominal CAT scan showed mild hydro on the left side and pyelonephritis changes present. The patient's white count was 17.4 on admission and creatinine was 1.65. She was admitted for UTI with pyelonephritis, started on IV fluids, along with Rocephin. Urine cultures showed Escherichia coli sensitive to all antibiotics, and blood cultures have been negative. The patient improved nicely with fluids and antibiotics. Her pains resolved and the patient was afebrile. We discharged her home with Cipro for 10 days.   DISCHARGE VITAL SIGNS: Temperature 99, heart rate 69, and blood pressure 135/72, and saturation 96% on room air.   The patient was advised to continue all her HIV medications and see her ID doctor at [facility]. White count has  decreased to 7.7 on August 20, and her kidney function also improved, and creatinine improved to 0.89 yesterday. The patient's abdominal pain resolved and she went home in stable condition.   TIME SPENT ON DISCHARGE PREPARATION: More than 30 minutes.    ____________________________ Epifanio Lesches, MD sk:at D: 10/17/2013 18:15:28 ET T: 10/17/2013 19:19:20 ET JOB#: [id number]  cc: Epifanio Lesches, MD, <Dictator> Epifanio Lesches MD ELECTRONICALLY SIGNED 10/30/2013 21:56

## 2014-06-20 NOTE — H&P (Signed)
PATIENT NAME:  Ashley [name], Ashley [name] MR#:  [id number] DATE OF BIRTH:  [birth year]  DATE OF ADMISSION:  10/14/2013  PRIMARY CARE PROVIDER: In [place].   EMERGENCY DEPARTMENT REFERRING PHYSICIAN: Dr. Karma Greaser.   CHIEF COMPLAINT: Abdominal pain, burning with urination.   HISTORY OF PRESENT ILLNESS: The patient is a 49 year old African American female with history of hepatitis C, HIV, diabetes, hypertension, who presents to the ED with complaint of having abdominal pain, and burning on the left side of her abdomen. She reports that this has been going on for the past few days. She also reports burning with urination. She describes the pain as a sharp pain. Also has associated nausea, but no diarrhea. She has had fevers and chills. She otherwise denies any chest pain, shortness of breath, denies any diarrhea.   PAST MEDICAL HISTORY: Significant for hepatitis C, history of HIV, diabetes type 2, hypertension.   PAST SURGICAL HISTORY: Status post hernia repair, status post liver biopsy, status post cholecystectomy, status post C-section.   ALLERGIES: None.   CURRENT MEDICATIONS: She is on Truvada 200/300 mg 1 tab p.o. daily, Prozac 40 daily, Isentress 400 daily, hydrochlorothiazide 25 p.o. daily, Flonase 50 mcg daily, clonidine 0.2 mg 1 tab p.o. t.i.d., amlodipine 10 daily.   SOCIAL HISTORY: Smokes 1 pack per day. No alcohol or drug use.   FAMILY HISTORY: Positive for diabetes and hypertension.   REVIEW OF SYSTEMS: CONSTITUTIONAL: Complains of fever, fatigue, weakness,  abdominal pain. No weight loss. No weight gain.  EYES: No blurred or double vision. No redness. No inflammation. No glaucoma.  ENT: No tinnitus. No ear pain. No hearing loss. No seasonal or year-round allergies.   RESPIRATORY: Denies any cough, wheezing, hemoptysis. No dyspnea. No asthma.  CARDIOVASCULAR: Denies any chest pain, orthopnea, edema, or arrhythmia.  GASTROINTESTINAL: Complains of nausea and vomiting and abdominal pain on  the left side of her abdomen. No hematemesis. No melena. No guarding. No IBS. No jaundice.  GENITOURINARY: Complains of burning with urination. No hematuria.  ENDOCRINE: Denies any polyuria, nocturia, or thyroid problems.  HEMATOLOGY AND LYMPHATICS: Denies any anemia, easy bruisability, or bleeding.  SKIN: No acne. No rash.  MUSCULOSKELETAL: Denies any pain in the neck, back or shoulder.  NEUROLOGIC: No numbness, CVA, TIA, or seizures.  PSYCHIATRIC: No anxiety, insomnia, or ADD.   PHYSICAL EXAMINATION: VITAL SIGNS: Temperature 102.8, pulse 90, respirations 20, blood pressure 147/87, O2 of 99%.  GENERAL: The patient is a well-developed, well-nourished female in no acute distress.  HEENT: Head atraumatic, normocephalic. Pupils equally round, reactive to light and accommodation. There is no conjunctival pallor. No scleral icterus. Nasal exam shows no drainage or ulceration. Ears, no drainage or external lesions. Mouth, no exudates.  NECK: Supple and symmetric. No masses. Thyroid midline. No JVD.  LUNGS: Clear to auscultation bilaterally without any rales, rhonchi, wheezing.  CARDIOVASCULAR: Regular rate and rhythm. No murmurs, rubs, clicks, or gallops. PMI is not displaced.  ABDOMEN: Soft, nontender, nondistended. Positive bowel sounds x 4.  GENITOURINARY: Deferred.  MUSCULOSKELETAL: There is no erythema or swelling.  SKIN: No rash.  LYMPHATICS: No lymph nodes palpable.  MUSCULOSKELETAL: There is no erythema or swelling.  VASCULAR: Good DP, PT pulses.  NEUROLOGIC: Cranial nerves II through XII grossly intact. No focal deficits.  PSYCHIATRIC: Not anxious or depressed.   LABORATORY DATA: Glucose 108, BUN 20, creatinine 1.65, sodium 137, potassium 3.8, chloride 100, CO2 of 26, lipase 168. LFTs: Total protein 8.6, albumin 3.6, bilirubin total 0.9, AST 74, ALT 78. Troponin  less than 0.02. WBC 17.4, hemoglobin 13.5, platelet count 119. Urinalysis: Leukocytes 1+, WBCs 52. CT of the abdomen and pelvis  without contrast showed there is no calcified stone. There is mild hydronephrosis on the left. Pyelonephritis could be present.   ASSESSMENT AND PLAN: The patient is a 49 year old African American female with history of hepatitis C, HIV, diabetes, hypertension, presents with burning with urination, left flank pain. Noted to have urinary tract infection with pyelonephritis.  1.  Urinary tract infection with pyelonephritis. At this time we will treat her with IV Rocephin. Follow blood cultures, urine cultures. In terms of the hydronephrosis, the patient will need outpatient urology followup.  2.  Human immunodeficiency virus. Will continue her home regimen. If she continues to have fever, will have ID evaluate the patient.  3.  Diabetes. She will be on sliding scale insulin on Actos.  4.  In terms of her hypertension, I will hold hydrochlorothiazide. Continue clonidine and amlodipine.  5.  Miscellaneous. The patient will be on Lovenox for deep vein thrombosis prophylaxis.   TIME SPENT ON THIS PATIENT: 45 minutes.    ____________________________ Lafonda Mosses Posey Pronto, MD shp:at D: 10/14/2013 17:38:25 ET T: 10/14/2013 17:49:33 ET JOB#: [id number]  cc: Nasire Reali H. Posey Pronto, MD, <Dictator> Alric Seton MD ELECTRONICALLY SIGNED 10/19/2013 13:59

## 2014-08-06 ENCOUNTER — Telehealth: Payer: Self-pay | Admitting: *Deleted

## 2014-08-06 NOTE — Telephone Encounter (Signed)
Received faxed form from [facility] regarding personal care services for patient. This form was sent back, blank, with a note to send to patient's PCP, Dr. Criss Rosales. She has not been seen by Dr. Linus Salmons in over a year. Myrtis Hopping

## 2014-12-21 ENCOUNTER — Other Ambulatory Visit: Payer: Self-pay

## 2014-12-24 ENCOUNTER — Other Ambulatory Visit: Payer: Medicaid Other

## 2014-12-24 DIAGNOSIS — B20 Human immunodeficiency virus [HIV] disease: Secondary | ICD-10-CM

## 2014-12-24 DIAGNOSIS — Z79899 Other long term (current) drug therapy: Secondary | ICD-10-CM

## 2014-12-24 LAB — LIPID PANEL
Cholesterol: 170 mg/dL (ref 125–200)
HDL: 81 mg/dL (ref >=46)
LDL CALC: 56 mg/dL (ref <130)
Total CHOL/HDL Ratio: 2.1 Ratio (ref <=5.0)
Triglycerides: 163 mg/dL — ABNORMAL HIGH (ref <150)
VLDL: 33 mg/dL — ABNORMAL HIGH (ref <30)

## 2014-12-24 LAB — COMPLETE METABOLIC PANEL WITH GFR
ALBUMIN: 4 g/dL (ref 3.6–5.1)
ALT: 50 U/L — ABNORMAL HIGH (ref 6–29)
AST: 65 U/L — AB (ref 10–35)
Alkaline Phosphatase: 62 U/L (ref 33–130)
BUN: 8 mg/dL (ref 7–25)
CHLORIDE: 105 mmol/L (ref 98–110)
CO2: 25 mmol/L (ref 20–31)
Calcium: 9 mg/dL (ref 8.6–10.4)
Creat: 0.83 mg/dL (ref 0.50–1.05)
GFR, Est African American: >89 mL/min (ref >=60)
GFR, Est Non African American: 82 mL/min (ref >=60)
GLUCOSE: 137 mg/dL — AB (ref 65–99)
POTASSIUM: 4.1 mmol/L (ref 3.5–5.3)
SODIUM: 138 mmol/L (ref 135–146)
Total Bilirubin: 0.4 mg/dL (ref 0.2–1.2)
Total Protein: 7.5 g/dL (ref 6.1–8.1)

## 2014-12-24 LAB — CBC WITH DIFFERENTIAL/PLATELET
BASOS PCT: 0 % (ref 0–1)
Basophils Absolute: 0 10*3/uL (ref 0.0–0.1)
Eosinophils Absolute: 0.1 10*3/uL (ref 0.0–0.7)
Eosinophils Relative: 3 % (ref 0–5)
HCT: 36.9 % (ref 36.0–46.0)
HEMOGLOBIN: 12.3 g/dL (ref 12.0–15.0)
Lymphocytes Relative: 36 % (ref 12–46)
Lymphs Abs: 1.1 10*3/uL (ref 0.7–4.0)
MCH: 30.5 pg (ref 26.0–34.0)
MCHC: 33.3 g/dL (ref 30.0–36.0)
MCV: 91.6 fL (ref 78.0–100.0)
MONO ABS: 0.3 10*3/uL (ref 0.1–1.0)
MPV: 10.4 fL (ref 8.6–12.4)
Monocytes Relative: 11 % (ref 3–12)
NEUTROS ABS: 1.6 10*3/uL — AB (ref 1.7–7.7)
NEUTROS PCT: 50 % (ref 43–77)
PLATELETS: 121 10*3/uL — AB (ref 150–400)
RBC: 4.03 MIL/uL (ref 3.87–5.11)
RDW: 14.3 % (ref 11.5–15.5)
WBC: 3.1 10*3/uL — AB (ref 4.0–10.5)

## 2014-12-25 LAB — T-HELPER CELL (CD4) - (RCID CLINIC ONLY)
CD4 % Helper T Cell: 24 % — ABNORMAL LOW (ref 33–55)
CD4 T Cell Abs: 270 /uL — ABNORMAL LOW (ref 400–2700)

## 2014-12-25 LAB — HIV-1 RNA QUANT-NO REFLEX-BLD
HIV 1 RNA QUANT: 29469 {copies}/mL — AB (ref <20)
HIV-1 RNA Quant, Log: 4.47 Log copies/mL — ABNORMAL HIGH (ref <1.30)

## 2015-02-02 ENCOUNTER — Encounter: Payer: Self-pay | Admitting: Internal Medicine

## 2015-02-02 ENCOUNTER — Ambulatory Visit (INDEPENDENT_AMBULATORY_CARE_PROVIDER_SITE_OTHER): Payer: Medicaid Other | Admitting: Internal Medicine

## 2015-02-02 VITALS — Ht <= 58 in | Wt 107.0 lb

## 2015-02-02 DIAGNOSIS — B182 Chronic viral hepatitis C: Secondary | ICD-10-CM

## 2015-02-02 DIAGNOSIS — B20 Human immunodeficiency virus [HIV] disease: Secondary | ICD-10-CM | POA: Diagnosis present

## 2015-02-02 DIAGNOSIS — G609 Hereditary and idiopathic neuropathy, unspecified: Secondary | ICD-10-CM

## 2015-02-02 DIAGNOSIS — Z23 Encounter for immunization: Secondary | ICD-10-CM

## 2015-02-02 MED ORDER — DOLUTEGRAVIR SODIUM 50 MG PO TABS: 50.0000 mg | ORAL_TABLET | Freq: Every day | ORAL | Status: DC

## 2015-02-02 MED ORDER — EMTRICITABINE-TENOFOVIR AF 200-25 MG PO TABS: 1.0000 | ORAL_TABLET | Freq: Every day | ORAL | Status: DC

## 2015-02-02 MED ORDER — FLUCONAZOLE 200 MG PO TABS: 200.0000 mg | ORAL_TABLET | Freq: Every day | ORAL | Status: DC

## 2015-02-02 MED ORDER — ONE-DAILY MULTI VITAMINS PO TABS: 1.0000 | ORAL_TABLET | Freq: Every day | ORAL | Status: DC

## 2015-02-02 NOTE — Assessment & Plan Note (Addendum)
She thinks she was referred to [place]'s hepatology so will defer to them for treatment if that is true.  If not, I will get her on treatment here once her HIV is under control.  I will update her labs since medicaid requires them to be within 6 months.   Will need elastography.

## 2015-02-02 NOTE — Assessment & Plan Note (Signed)
I will restart her regimen with tivicay and Descovy and repeat her labs in about 4 weeks.  I also will have her seen by our RN case manager to help keep her on track this time.  RTC 5 weeks.

## 2015-02-02 NOTE — Assessment & Plan Note (Signed)
She is going to discuss this with her PCP.

## 2015-02-02 NOTE — Progress Notes (Signed)
   Subjective:    Patient ID: Ashley [name], female    DOB: September 15, 1965, 49 y.o.   MRN: [id number]  HPI Here to reestablish care for HIV.   She has a history of poor compliance and initially came in 2013 after transfer from [facility] and started on Tivicay and Truvada but did not come back until 2015 and back on same regimen but again did not return.  Was seen by [place] in [facility] during an admission for MDD.  Had genotype and not done and integrase genotype not predicted to have resistance.  Came back for labs in October and CD4 of 270 and viral load 29,000.  See Dr. Criss Rosales for her PCP.  Has symptoms of peripheral neuropathy.  Asking for Ensure due to poor weight gain, asking for multivitamin prescription.  She also tells me she was referred to [facility] hepatology clinic for her hepatitis C and waiting for an appointment there.  She has genotype 1a, viral load of 1 million.  Has transaminitis.   Review of Systems  Constitutional: Negative for fatigue.  HENT: Positive for sore throat and trouble swallowing.   Gastrointestinal: Negative for nausea and diarrhea.  Skin: Negative for rash.  Neurological: Negative for dizziness and light-headedness.  Psychiatric/Behavioral: Negative for suicidal ideas and sleep disturbance. The patient is not nervous/anxious.        Objective:   Physical Exam  Constitutional: She appears well-developed and well-nourished. No distress.  HENT:  Mouth/Throat: No oropharyngeal exudate.  Eyes: No scleral icterus.  Cardiovascular: Normal rate, regular rhythm and normal heart sounds.   No murmur heard. Pulmonary/Chest: Effort normal and breath sounds normal. No respiratory distress.  Lymphadenopathy:    She has no cervical adenopathy.  Skin: No rash noted.   Social History   Social History  . Marital Status: Single    Spouse Name: N/A  . Number of Children: N/A  . Years of Education: N/A   Occupational History  . Not on file.   Social History Main Topics  .  Smoking status: Current Every Day Smoker -- 1.00 packs/day for 35 years    Types: Cigarettes  . Smokeless tobacco: Never Used  . Alcohol Use: No  . Drug Use: Yes    Special: Marijuana, Cocaine  . Sexual Activity: Yes    Birth Control/ Protection: Condom     Comment: given condoms   Other Topics Concern  . Not on file   Social History Narrative         Assessment & Plan:

## 2015-02-04 ENCOUNTER — Ambulatory Visit: Payer: Self-pay | Admitting: *Deleted

## 2015-02-04 ENCOUNTER — Telehealth: Payer: Self-pay | Admitting: *Deleted

## 2015-02-04 DIAGNOSIS — B20 Human immunodeficiency virus [HIV] disease: Secondary | ICD-10-CM

## 2015-02-04 NOTE — Telephone Encounter (Signed)
RN received a referral from Dr Linus Salmons on 02/02/15 for [facility] Nurse. After reviewing the patient's chart, contact was made and Initial Home visit scheduled for this afternoon.

## 2015-02-09 ENCOUNTER — Other Ambulatory Visit: Payer: Self-pay | Admitting: *Deleted

## 2015-02-10 ENCOUNTER — Other Ambulatory Visit: Payer: Self-pay | Admitting: *Deleted

## 2015-02-10 NOTE — Progress Notes (Signed)
Patient ID: Ashley [name], female   DOB: 03/22/1965, 49 y.o.   MRN: [id number] RN traveled to the patient's home to offer the services of [facility]. Rn introduced herself and explained the reason for my visit. Patient began to update the RN on her life and current struggles. Patient stated she takes her medications each day but money is a problem for her. Patient states her family is not supporting her and she does not have resources to provide for her needs. After actively listening to the patient she showed me that she has a home full of food, money for laundry, lights and heat. RN advised the patient that I would like to return and for [place] to sit and talk about the best way I can be of service to her. Time restraints would not allow [place] to do it at this time.

## 2015-02-15 ENCOUNTER — Other Ambulatory Visit (HOSPITAL_COMMUNITY)
Admission: RE | Admit: 2015-02-15 | Discharge: 2015-02-15 | Disposition: A | Discharge status: Home / Self Care | Payer: Medicaid Other | Source: Ambulatory Visit | Attending: Family Medicine | Admitting: Family Medicine

## 2015-02-15 ENCOUNTER — Other Ambulatory Visit: Payer: Medicaid Other | Admitting: Family Medicine

## 2015-02-15 ENCOUNTER — Other Ambulatory Visit: Payer: Self-pay | Admitting: Family Medicine

## 2015-02-15 DIAGNOSIS — N760 Acute vaginitis: Secondary | ICD-10-CM | POA: Diagnosis present

## 2015-02-15 DIAGNOSIS — R87810 Cervical high risk human papillomavirus (HPV) DNA test positive: Secondary | ICD-10-CM | POA: Diagnosis present

## 2015-02-15 DIAGNOSIS — Z1151 Encounter for screening for human papillomavirus (HPV): Secondary | ICD-10-CM | POA: Diagnosis present

## 2015-02-15 DIAGNOSIS — Z01411 Encounter for gynecological examination (general) (routine) with abnormal findings: Secondary | ICD-10-CM | POA: Diagnosis present

## 2015-02-15 DIAGNOSIS — Z113 Encounter for screening for infections with a predominantly sexual mode of transmission: Secondary | ICD-10-CM | POA: Insufficient documentation

## 2015-02-17 ENCOUNTER — Telehealth: Payer: Self-pay | Admitting: *Deleted

## 2015-02-17 NOTE — Telephone Encounter (Signed)
RN contacted the patient to arrange visit time. Did not receive a answer and the name on the voicemail stated "Sherie Don". RN did not leave a message at this time since that name is not listed on the patient's emergency contact information

## 2015-02-18 LAB — URINE CYTOLOGY ANCILLARY ONLY
CANDIDA VAGINITIS: NEGATIVE
CHLAMYDIA, DNA PROBE: NEGATIVE
Neisseria Gonorrhea: NEGATIVE
TRICH (WINDOWPATH): NEGATIVE

## 2015-02-18 LAB — CYTOLOGY - PAP

## 2015-02-23 ENCOUNTER — Other Ambulatory Visit: Payer: Self-pay

## 2015-02-26 ENCOUNTER — Ambulatory Visit: Payer: Self-pay

## 2015-03-08 ENCOUNTER — Ambulatory Visit: Payer: Self-pay | Admitting: Internal Medicine

## 2015-03-12 ENCOUNTER — Telehealth: Payer: Self-pay | Admitting: *Deleted

## 2015-03-12 ENCOUNTER — Ambulatory Visit: Payer: Self-pay

## 2015-03-12 NOTE — Telephone Encounter (Deleted)
RN received a message form Dr Linus Salmons stating the patient missed her last appt due to inclement weather. RN contacted the patient to arrange another appt along with a home visit with myself. RN continues to get a message on the patient's listed voicemail stating "this is Ashley [name]". RN did not leave a message but did contact her emergency contact [name]) and received a message stating "the code you have dial is not in service". When I have a planned visit in [place] I will also do a drive by to this patient's home.

## 2015-03-12 NOTE — Telephone Encounter (Signed)
RN received a message form Dr Linus Salmons stating the patient missed her last appt due to inclement weather. RN contacted the patient to arrange another appt along with a home visit with myself. RN continues to get a message on the patient's listed voicemail stating "this is Ashley [name]". RN did not leave a message but did contact her emergency contact [name]) and received a message stating "the code you have dial is not in service". When I have a planned visit in [place] I will also do a drive by to this patient's home.

## 2015-03-26 ENCOUNTER — Ambulatory Visit (INDEPENDENT_AMBULATORY_CARE_PROVIDER_SITE_OTHER): Payer: Medicaid Other | Admitting: *Deleted

## 2015-03-26 ENCOUNTER — Ambulatory Visit: Payer: Self-pay

## 2015-03-26 ENCOUNTER — Other Ambulatory Visit (HOSPITAL_COMMUNITY)
Admission: RE | Admit: 2015-03-26 | Discharge: 2015-03-26 | Disposition: A | Discharge status: Home / Self Care | Payer: Medicaid Other | Source: Ambulatory Visit | Attending: Internal Medicine | Admitting: Internal Medicine

## 2015-03-26 DIAGNOSIS — Z124 Encounter for screening for malignant neoplasm of cervix: Secondary | ICD-10-CM | POA: Diagnosis not present

## 2015-03-26 DIAGNOSIS — N9089 Other specified noninflammatory disorders of vulva and perineum: Secondary | ICD-10-CM | POA: Diagnosis not present

## 2015-03-26 DIAGNOSIS — Z01411 Encounter for gynecological examination (general) (routine) with abnormal findings: Secondary | ICD-10-CM | POA: Insufficient documentation

## 2015-03-26 DIAGNOSIS — R630 Anorexia: Secondary | ICD-10-CM | POA: Diagnosis not present

## 2015-03-26 DIAGNOSIS — Z1151 Encounter for screening for human papillomavirus (HPV): Secondary | ICD-10-CM | POA: Diagnosis not present

## 2015-03-26 DIAGNOSIS — Z1231 Encounter for screening mammogram for malignant neoplasm of breast: Secondary | ICD-10-CM

## 2015-03-26 MED ORDER — ENSURE PO LIQD: 237.0000 mL | Freq: Two times a day (BID) | ORAL | Status: DC

## 2015-03-26 NOTE — Progress Notes (Signed)
  Subjective:     Ashley [name] is a 50 y.o. woman who comes in today for a  pap smear only.. Previous abnormal Pap smear:  no. Contraception: condoms.  Objective:    There were no vitals taken for this visit. Pelvic Exam:  Pap smear obtained.   Assessment:    Screening pap smear.   Plan:    Follow up in one year, or as indicated by Pap results.  Subjective:     Ashley [name] is a 50 y.o. woman who comes in today for a  pap smear only.  Previous abnormal Pap smears: no. Contraception: condoms.  Pt c/o blister-like lesion on labia major near [place].  0.5 cm lesion observed.  Attempted to obatin viral sample.  Will send for identification.  Pt c/o of lesion being severely painful.  This same type of lesion happened last March, 2016.  Pt did not have it evaluated at the time.    Objective:    There were no vitals taken for this visit. Pelvic Exam:  Pap smear obtained.   Assessment:    Screening pap smear.   Plan:    Follow up in one year, or as indicated by Pap results.  Pt given educational materials re: HIV and women, self-esteem, BSE, nutrition and diet management, PAP smears and partner safety. Pt given condoms.

## 2015-03-26 NOTE — Patient Instructions (Signed)
Your results will be ready in about a week.  I will send them to you in MyChart.  Thank you for coming to the Center for your care.  Denise, RN 

## 2015-03-26 NOTE — Addendum Note (Signed)
Addended by: Lorne Skeens D on: 03/26/2015 11:59 AM   Modules accepted: Orders

## 2015-03-29 LAB — HERPES SIMPLEX VIRUS CULTURE: ORGANISM ID, BACTERIA: NOT DETECTED

## 2015-03-30 LAB — CYTOLOGY - PAP

## 2015-04-01 ENCOUNTER — Other Ambulatory Visit: Payer: Self-pay

## 2015-04-01 DIAGNOSIS — Z1231 Encounter for screening mammogram for malignant neoplasm of breast: Secondary | ICD-10-CM

## 2015-04-02 ENCOUNTER — Ambulatory Visit: Payer: Self-pay

## 2015-04-02 ENCOUNTER — Encounter: Payer: Self-pay | Admitting: *Deleted

## 2015-04-02 DIAGNOSIS — R87610 Atypical squamous cells of undetermined significance on cytologic smear of cervix (ASC-US): Secondary | ICD-10-CM

## 2015-04-05 ENCOUNTER — Telehealth: Payer: Self-pay | Admitting: *Deleted

## 2015-04-05 DIAGNOSIS — R87811 Vaginal high risk human papillomavirus (HPV) DNA test positive: Principal | ICD-10-CM

## 2015-04-05 DIAGNOSIS — R87620 Atypical squamous cells of undetermined significance on cytologic smear of vagina (ASC-US): Secondary | ICD-10-CM

## 2015-04-05 NOTE — Telephone Encounter (Signed)
Abnormal PAP smear - High risk ASCUS.  Left message for pt to call about GYN MD office.  Pt returned call.  She does not have a GYN office.  She does have Medicaid.  Will refer to [place].

## 2015-04-05 NOTE — Telephone Encounter (Signed)
RN received a call from Denise/[facility] with a update on the patient. Langley Gauss stated that patient information has been update with another phone number so I may be able to reach the patient now. RN contacted the patient today at the new number. RN left a message stating my name and asking Ms. Fettig to please return my call.

## 2015-04-06 ENCOUNTER — Ambulatory Visit: Payer: Medicaid Other | Admitting: *Deleted

## 2015-04-06 VITALS — BP 136/80 | HR 88 | Temp 98.3°F | Resp 19

## 2015-04-06 DIAGNOSIS — B20 Human immunodeficiency virus [HIV] disease: Secondary | ICD-10-CM

## 2015-04-06 DIAGNOSIS — R87620 Atypical squamous cells of undetermined significance on cytologic smear of vagina (ASC-US): Secondary | ICD-10-CM

## 2015-04-06 DIAGNOSIS — R87811 Vaginal high risk human papillomavirus (HPV) DNA test positive: Secondary | ICD-10-CM

## 2015-04-07 NOTE — Telephone Encounter (Signed)
[place] will not accept the pt's Medicaid.  Sent referral to [facility].

## 2015-04-07 NOTE — Addendum Note (Signed)
Addended by: Lorne Skeens D on: 04/07/2015 04:16 PM   Modules accepted: Orders

## 2015-04-08 ENCOUNTER — Telehealth: Payer: Self-pay | Admitting: *Deleted

## 2015-04-08 NOTE — Telephone Encounter (Signed)
RN received a call from the patient wanting a update on her Gynecologist appt. RN informed the patient that Langley Gauss has sent a referral over to [facility] for her Gynecologist appt to be scheduled. RN informed the patient to please give me a call if she has not received a appt by the first of the week

## 2015-04-12 NOTE — Patient Instructions (Signed)
RN met with client and or caregiver for assessment. RN reviewed Agency Services, [place] Notice of Privacy Policy, Home Safety Management Information Booklet. Home Fire Safety Assessment, Fall Risk Assessment and Suicide Risk Assessment was performed. RN also discussed information on a Living Will, Advanced Directives, and Health Care Power of Attorney. RN and Client/Designated Party educated/reviewed/signed Client Agreement and Consent for Service form along with Patient Rights and Responsibilities statement. RN developed patient specific and centered care plan. RN provided contact information and reviewed how to receive emergency help after hours for schedule changes, billing questions, reporting of safety issues, falls, concerns or any needs/questions. Standard Precaution and Infection control along with interventions to correct or prevent high risk behaviors instructed to the patient. Client/Caregiver reports understanding and agreement with the above 

## 2015-04-12 NOTE — Progress Notes (Addendum)
Patient ID: Ashley [name], female   DOB: 1966/02/21, 50 y.o.   MRN: [id number] RN meet with the patient in her home. RN reminded the patient of my services and that I would like to continue to see her to work on medication adherence. Patient was in agreement to have the services and wanted a better understanding of what a abnormal PAP smear means. RN explained the many explanations for a abnormal PAP smear. Patient immediately became upset. RN explained to the patient that a abnormal PAP smear does not mean she has cervical cancer it just means further investigation is needed. RN contacted [facility] for follow up to Abnormal PAP Smear. While attempting to schedule the appt the receptionist stated they clinic will only accept cash payment they will not be able to bill her insurance. Patient cannot afford this option. RN then contacted [facility] who stated they can accept the patient along with bill her medicaid if we can send over a referral. RN then sent a message to Tawni Millers RN asking for a referral to be sent to [facility] for Ashley [name]. Plan at this time is to develop the patient centered plan of care  Order received 02/02/2015 by Dr. Linus Salmons to evaluate patient for [facility]). 1st Contact was made on 02/04/2015 Patient was evaluated with initiation of services on 04/08/2015 for [facility]. Patient was consented to care at this time.   Frequency / Duration of [facility] visits: Effective 04/08/2015: 17mo1, 52mo1, 7mo1, 3PRN's for complications with disease process or RN identifies barriers that will interfere with medication adherence/compliance  [facility] will assess for learning needs related to diagnosis and treatment regimen, provide education as needed, fill pill box with each visit, fill syringes if liquid medication is required, communicate with care team including physician and case manager.  Individualized Plan Of Care,  Certification Period 04/08/2015- 07/07/2015 a. Type of service(s) and care to be delivered: RN, [place] Nurse b. Frequency and duration of service:Effective 04/08/2015: 32mo1, 77mo1, 53mo1, 3PRN's for complications with disease process or RN identifies barriers that will interfere with medication adherence/compliance c. Activity restrictions: None Noted d. Safety Measures:Standard precautions/Infection Control e. Service Objectives and Goals:Service Objectives are to assist the pt with HIV medication regimen adherence and staying in care with the Infectious Disease Clinic by identifying barriers to care. RN will address the barriers that are identified by the patient. Patient stated goals is to begin to take her medications and no longer do dope.. RN will assist the patient by offering medication management which includes weekly to biweekly pillbox fills of her oral medications and resources for Substance Abuse [place]/rehabilitation.  RN will coordinate care with [place] to ensure medications are being requested and shipped to the patient. Focus of care will be medication adherence and compliance f. Equipment required: No additional equipment needs noted g. Functional Limitations: No functional limitations noted or expressed h. Rehabilitation potential: Guarded i. Diet and Nutritional Needs:Regular diet j. Medications and treatments: Medications have been reviewed and reconciled. Medications list is a part of this, EPIC, Electronic Charting System k. Specific therapies if needed: None l. Pertinent diagnoses:Hx of medication noncompliance, HIV disease, Depression m. Expected outcome: Guarded

## 2015-04-15 ENCOUNTER — Ambulatory Visit: Payer: Self-pay | Admitting: Obstetrics

## 2015-04-16 ENCOUNTER — Ambulatory Visit: Payer: Self-pay | Admitting: *Deleted

## 2015-04-16 VITALS — BP 96/62 | HR 72 | Temp 95.5°F | Resp 18

## 2015-04-16 DIAGNOSIS — B20 Human immunodeficiency virus [HIV] disease: Secondary | ICD-10-CM

## 2015-04-16 NOTE — Patient Instructions (Signed)
Please refer to progress note for details of today's visit 

## 2015-04-20 NOTE — Progress Notes (Signed)
RN meet with the patient in her home. Patient stated she did received a call from the [facility] and also received a appt with [facility] but missed the appt. Rn rescheduled a appt for the patient at [place] for a date that she states she can keep. RN wrote the patient's upcoming appt down for her. During today's visit we reviewed all of her medications and her  System for taking her medications. Patient states she likes to take her medications out of the bottle and declined a pillbox at this time. Patient stated she has been taking her medications each day and is doing well with not using drugs. Patient went on to discuss how she has been distancing herself from people that use drugs and use her. RN encouraged the patient to continue on this path since using can decrease her ability to reason and make good choices. Focus of today's visit was to establish a trusting relationship with the patient with a plan on assisting the patient with medication adherence

## 2015-04-21 ENCOUNTER — Ambulatory Visit: Payer: Self-pay

## 2015-04-26 ENCOUNTER — Ambulatory Visit: Payer: Self-pay | Admitting: *Deleted

## 2015-04-26 ENCOUNTER — Ambulatory Visit: Payer: Self-pay | Admitting: Obstetrics

## 2015-04-26 VITALS — BP 98/62 | HR 72 | Temp 97.6°F | Resp 17

## 2015-04-26 DIAGNOSIS — B20 Human immunodeficiency virus [HIV] disease: Secondary | ICD-10-CM

## 2015-04-30 ENCOUNTER — Ambulatory Visit
Admission: RE | Admit: 2015-04-30 | Discharge: 2015-04-30 | Disposition: A | Discharge status: Home / Self Care | Payer: Medicaid Other | Source: Ambulatory Visit

## 2015-04-30 DIAGNOSIS — Z1231 Encounter for screening mammogram for malignant neoplasm of breast: Secondary | ICD-10-CM

## 2015-05-04 ENCOUNTER — Telehealth: Payer: Self-pay | Admitting: *Deleted

## 2015-05-04 NOTE — Patient Instructions (Signed)
Please refer to progess note for details of today's visit

## 2015-05-04 NOTE — Progress Notes (Signed)
Patient ID: Ashley [name], female   DOB: 1965-06-03, 50 y.o.   MRN: [id number] RN meet with the patient in her home. Patient stated she had to reschedule her appt due to no transportation. Patient retrieved her list of appt.  During today's visit we reviewed all of her medications and her  System for taking her medications. Patient stated she has been taking her medications each day and is doing well with not using drugs. RN encouraged the patient to continue on this path since using can decrease her ability to reason and make good choices. Focus of today's visit was to establish a trusting relationship with the patient with a plan on assisting the patient with medication adherence

## 2015-05-04 NOTE — Telephone Encounter (Signed)
RN received a call from the patient stating she wanted to know when her appt was. RN checked the computer and informed the patient that she has a appt with Dr Linus Salmons on 04/27 at 12. Ms. Dabish said she wanted to know about her other appts. RN informed the patient that it appears she cancelled her appt with Dr Jodi Mourning for 03/27 and I do not see that it has been rescheduled. Patient stated she did cancel it but also requested a reschedule. RN informed the patient that she would need to call Dr Jacelyn Grip office to see when her appt is scheduled for. RN gave the patient Dr Jacelyn Grip number for her to contact their office

## 2015-05-10 ENCOUNTER — Telehealth: Payer: Self-pay | Admitting: *Deleted

## 2015-05-10 NOTE — Telephone Encounter (Signed)
RN received a call from the patient wanting to arrange our visit. RN planned a visit for tomorrow with the patient. Ms. Lashomb then asked if I would let her know when her next  appt was. Patient has asked for this information several times and I am concerned that she is not remembering or writing down the information. RN reminded the patient of her appt and asked that she be sure to write the information down so she will not miss this appt again. RN will also f/u with the patient tomorrow on her appt date

## 2015-05-12 ENCOUNTER — Ambulatory Visit: Payer: Self-pay | Admitting: *Deleted

## 2015-05-12 VITALS — BP 110/80 | HR 84 | Temp 97.2°F | Resp 17

## 2015-05-12 DIAGNOSIS — B20 Human immunodeficiency virus [HIV] disease: Secondary | ICD-10-CM

## 2015-05-18 NOTE — Progress Notes (Signed)
Patient ID: Ashley [name], female   DOB: [birth year], 50 y.o.   MRN: [id number] RN meet with the patient in her home, 05/12/2015. Together we went over her upcoming appointments.  Patient retrieved her list of appt.  During today's visit we reviewed all of her medications and her system for taking her medications. Patient stated she has been taking her medications each day and is doing well with not using drugs. Patient talked in detail about her struggles with additional and states it has been a week since she last used dope (crack cocaine). Patient states her current drug of choice is marijuana and she is happy using that alone. RN encouraged the patient to try and decrease all drug use since using can decrease her ability to reason and make good choices. Focus of today's visit was to establish a trusting relationship with the patient with a plan on assisting the patient with medication adherence

## 2015-05-24 ENCOUNTER — Ambulatory Visit: Payer: Self-pay | Admitting: Obstetrics

## 2015-05-28 ENCOUNTER — Other Ambulatory Visit (HOSPITAL_COMMUNITY)
Admission: RE | Admit: 2015-05-28 | Discharge: 2015-05-28 | Disposition: A | Discharge status: Home / Self Care | Payer: Medicaid Other | Source: Ambulatory Visit | Attending: Family Medicine | Admitting: Family Medicine

## 2015-05-28 ENCOUNTER — Other Ambulatory Visit: Payer: Self-pay | Admitting: Family Medicine

## 2015-05-28 DIAGNOSIS — Z113 Encounter for screening for infections with a predominantly sexual mode of transmission: Secondary | ICD-10-CM | POA: Diagnosis not present

## 2015-05-28 DIAGNOSIS — N760 Acute vaginitis: Secondary | ICD-10-CM | POA: Insufficient documentation

## 2015-05-31 ENCOUNTER — Other Ambulatory Visit (HOSPITAL_COMMUNITY): Payer: Self-pay | Admitting: Nurse Practitioner

## 2015-05-31 DIAGNOSIS — B182 Chronic viral hepatitis C: Secondary | ICD-10-CM

## 2015-06-02 LAB — URINE CYTOLOGY ANCILLARY ONLY
BACTERIAL VAGINITIS: POSITIVE — AB
CANDIDA VAGINITIS: NEGATIVE
CHLAMYDIA, DNA PROBE: NEGATIVE
NEISSERIA GONORRHEA: NEGATIVE
TRICH (WINDOWPATH): NEGATIVE

## 2015-06-09 ENCOUNTER — Ambulatory Visit (HOSPITAL_COMMUNITY): Payer: Medicaid Other

## 2015-06-16 ENCOUNTER — Encounter: Payer: Self-pay | Admitting: Obstetrics

## 2015-06-16 ENCOUNTER — Ambulatory Visit (INDEPENDENT_AMBULATORY_CARE_PROVIDER_SITE_OTHER): Payer: Medicaid Other | Admitting: Certified Nurse Midwife

## 2015-06-16 ENCOUNTER — Encounter: Payer: Self-pay | Admitting: *Deleted

## 2015-06-16 VITALS — BP 101/68 | HR 69 | Wt 102.0 lb

## 2015-06-16 DIAGNOSIS — Z8742 Personal history of other diseases of the female genital tract: Secondary | ICD-10-CM | POA: Diagnosis not present

## 2015-06-16 NOTE — Progress Notes (Signed)
Patient ID: Ashley [name], female   DOB: 1965-09-19, 50 y.o.   MRN: [id number]  Chief Complaint  Patient presents with  . New Patient (Initial Visit)    new pt referral, abnormal pap, last pap 01-2015    HPI Ashley [name] is a 50 y.o. female.  Here for f/u on abnormal pap smear and to establish care.  Patient had abnormal pap smear on 03/26/2015 of ASCUS and +HPV.  Has a hx of HIV/Hep.C.  Patient is worried.  Discussed results of last pap smear.  Encouraged patient to f/u in 4 months for another pap smear, due to timing of last pap smear.  Patient agreed to plan.  Is sexually active, uses condoms.  Denies any vaginal bleeding.  Post menopausal status.    HPI  Past Medical History  Diagnosis Date  . HIV (human immunodeficiency virus infection) ([place])   . Hepatitis C   . Diabetes mellitus   . Anxiety   . Pinched nerve   . Scoliosis   . Hernia   . Ankle fracture   . Hypertension   . TB (tuberculosis)   . Depression   . Asthma   . Collagen vascular disease ([place])   . HIV (human immunodeficiency virus infection) [facility])     Past Surgical History  Procedure Laterality Date  . Cholecystectomy    . Cesarean section    . Hernia repair    . Liver biopsy      Family History  Problem Relation Age of Onset  . Diabetes Mother   . Hypertension Mother   . Stroke Mother     died 09-09-13   Social History Social History  Substance Use Topics  . Smoking status: Current Every Day Smoker -- 1.00 packs/day for 35 years    Types: Cigarettes  . Smokeless tobacco: Never Used  . Alcohol Use: No    No Known Allergies  Current Outpatient Prescriptions  Medication Sig Dispense Refill  . amLODipine (NORVASC) 10 MG tablet Take 1 tablet (10 mg total) by mouth at bedtime. 30 tablet 0  . cloNIDine (CATAPRES) 0.2 MG tablet Take 0.2 mg by mouth 3 (three) times daily.    . dolutegravir (TIVICAY) 50 MG tablet Take 1 tablet (50 mg total) by mouth daily. 30 tablet 5  . emtricitabine-tenofovir AF  (DESCOVY) 200-25 MG tablet Take 1 tablet by mouth daily. 30 tablet 5  . fexofenadine (ALLEGRA) 180 MG tablet Take 180 mg by mouth daily.    Marland Kitchen lisinopril (PRINIVIL,ZESTRIL) 40 MG tablet Take 40 mg by mouth daily.    . vitamin E 400 UNIT capsule Take 400 Units by mouth daily.    Marland Kitchen acetaminophen (TYLENOL) 325 MG tablet Take 650 mg by mouth every 6 (six) hours as needed for mild pain. Reported on 06/16/2015    . barrier cream (NON-SPECIFIED) CREA Apply 1 application topically 2 (two) times daily as needed. (Patient not taking: Reported on 04/16/2015)    . dicyclomine (BENTYL) 20 MG tablet Take 1 tablet (20 mg total) by mouth 2 (two) times daily as needed for spasms. (Patient not taking: Reported on 04/16/2015) 20 tablet 0  . emtricitabine-tenofovir (TRUVADA) 200-300 MG per tablet Take 1 tablet by mouth daily. (Patient not taking: Reported on 06/16/2015) 30 tablet 0  . ENSURE (ENSURE) Take 237 mLs by mouth 2 (two) times daily between meals. (Patient not taking: Reported on 04/16/2015) 237 mL 11  . FLUoxetine (PROZAC) 20 MG capsule Take 1 capsule (20 mg total)  by mouth daily. (Patient not taking: Reported on 04/16/2015) 30 capsule 0  . loratadine (CLARITIN) 10 MG tablet Take 1 tablet (10 mg total) by mouth daily. (Patient not taking: Reported on 04/16/2015) 30 tablet 0  . Multiple Vitamin (MULTIVITAMIN) tablet Take 1 tablet by mouth daily. (Patient not taking: Reported on 06/16/2015) 30 tablet 5  . nystatin (MYCOSTATIN/NYSTOP) 100000 UNIT/GM POWD Apply to groin BID (Patient not taking: Reported on 02/02/2015)  0  . polyethylene glycol (MIRALAX / GLYCOLAX) packet Take 17 g by mouth daily. (Patient not taking: Reported on 02/02/2015) 14 each 0  . QUEtiapine (SEROQUEL) 25 MG tablet Take 1 tablet (25 mg total) by mouth 2 (two) times daily. (Patient not taking: Reported on 04/16/2015) 60 tablet 0  . traMADol (ULTRAM) 50 MG tablet Take 50 mg by mouth every 6 (six) hours as needed for moderate pain. Reported on 06/16/2015     . traZODone (DESYREL) 100 MG tablet Take 1 tablet (100 mg total) by mouth at bedtime. (Patient not taking: Reported on 04/16/2015) 30 tablet 0   No current facility-administered medications for this visit.    Review of Systems Review of Systems Constitutional: negative for fatigue and weight loss Respiratory: negative for cough and wheezing Cardiovascular: negative for chest pain, fatigue and palpitations Gastrointestinal: negative for abdominal pain and change in bowel habits Genitourinary:negative Integument/breast: negative for nipple discharge Musculoskeletal:negative for myalgias Neurological: negative for gait problems and tremors Behavioral/Psych: negative for abusive relationship, depression Endocrine: negative for temperature intolerance     Blood pressure 101/68, pulse 69, weight 102 lb (46.267 kg).  Physical Exam Physical Exam General:   alert  Skin:   no rash or abnormalities  Lungs:   clear to auscultation bilaterally  Heart:   regular rate and rhythm, S1, S2 normal, no murmur, click, rub or gallop  Breasts:   deferred  Abdomen:  normal findings: no organomegaly, soft, non-tender and no hernia  Pelvis:  deferred    100% of 15 min visit spent on counseling and coordination of care.   Data Reviewed Previous medical hx, meds, labs  Assessment     High risk patient with hx of abnormal pap smear     Plan    No orders of the defined types were placed in this encounter.   Meds ordered this encounter  Medications  . fexofenadine (ALLEGRA) 180 MG tablet    Sig: Take 180 mg by mouth daily.      Follow up after July 28th for repeat pap smear/exam.

## 2015-06-16 NOTE — Patient Instructions (Signed)
Pap Test WHY AM I HAVING THIS TEST? A pap test is sometimes called a pap smear. It is a screening test that is used to check for signs of cancer of the vagina, cervix, and uterus. The test can also identify the presence of infection or precancerous changes. Your health care provider will likely recommend you have this test done on a regular basis. This test may be done:  Every 3 years, starting at age 53.  Every 5 years, in combination with testing for the presence of human papillomavirus (HPV).  More or less often depending on other medical conditions.  WHAT KIND OF SAMPLE IS TAKEN? Using a small cotton swab, plastic spatula, or brush, your health care provider will collect a sample of cells from the surface of your cervix. Your cervix is the opening to your uterus, also called a womb. Secretions from the cervix and vagina may also be collected. HOW DO I PREPARE FOR THE TEST?  Be aware of where you are in your menstrual cycle. You may be asked to reschedule the test if you are menstruating on the day of the test.  You may need to reschedule if you have a known vaginal infection on the day of the test.  You may be asked to avoid douching or taking a bath the day before or the day of the test.  Some medicines can cause abnormal test results, such as digitalis and tetracycline. Talk with your health care provider before your test if you take one of these medicines. WHAT DO THE RESULTS MEAN? Abnormal test results may indicate a number of health conditions. These may include:  Cancer. Although pap test results cannot be used to diagnose cancer of the cervix, vagina, or uterus, they may suggest the possibility of cancer. Further tests would be required to determine if cancer is present.  Sexually transmitted disease.  Fungal infection.  Parasite infection.  Herpes infection.  A condition causing or contributing to infertility. It is your responsibility to obtain your test results. Ask  the lab or department performing the test when and how you will get your results. Contact your health care provider to discuss any questions you have about your results.   This information is not intended to replace advice given to you by your health care provider. Make sure you discuss any questions you have with your health care provider.   Document Released: 05/06/2002 Document Revised: 03/06/2014 Document Reviewed: 07/07/2013 Elsevier Interactive Patient Education 2016 Elsevier Inc. Human Papillomavirus Human papillomavirus (HPV) is the most common sexually transmitted infection (STI). It is easy to pass it from person to person (contagious). HPV can cause cervical cancer, anal cancer, and genital warts. The genital warts can be seen and felt. Also, there may be wartlike regions in the throat. HPV may not have any symptoms. It is possible to have HPV for a long time and not know it. You may pass HPV on to others without knowing it.  HOME CARE   Take medicines as told by your doctor.  Use over-the-counter creams for itching as told by your doctor.  Keep all follow-up visits. Make sure to get Pap tests as told by your doctor.  Do not touch or scratch the warts.  Do not treat genital warts with medicines used for treating hand warts.  Do not have sex while you are getting treatment.  Do not douche or use tampons during treatment of HPV.  Tell your sex partner about your infection because he or she  may also need treatment.  If you get pregnant, tell your doctor that you had HPV. Your doctor will watch your pregnancy closely. This is important to keep your baby safe.  After treatment, use condoms during sex to prevent future infections.  Have only one sex partner.  Have a sex partner who does not have other sex partners. GET HELP IF:   The treated skin is red, swollen, or painful.  You have a fever.  You feel ill.  You feel lumps or pimple-like areas in and around your  genital area.  You have bleeding of the vagina or the area that was treated.  You have pain during sex. MAKE SURE YOU:  Understand these instructions.  Will watch your condition.  Will get help if you are not doing well or get worse.   This information is not intended to replace advice given to you by your health care provider. Make sure you discuss any questions you have with your health care provider.   Document Released: 01/27/2008 Document Revised: 03/06/2014 Document Reviewed: 05/21/2013 Elsevier Interactive Patient Education [employer].

## 2015-06-17 ENCOUNTER — Ambulatory Visit (HOSPITAL_COMMUNITY): Admission: RE | Admit: 2015-06-17 | Payer: Medicaid Other | Source: Ambulatory Visit

## 2015-06-24 ENCOUNTER — Encounter: Payer: Self-pay | Admitting: Internal Medicine

## 2015-06-24 ENCOUNTER — Ambulatory Visit (INDEPENDENT_AMBULATORY_CARE_PROVIDER_SITE_OTHER): Payer: Medicaid Other | Admitting: Internal Medicine

## 2015-06-24 ENCOUNTER — Other Ambulatory Visit (HOSPITAL_COMMUNITY)
Admission: RE | Admit: 2015-06-24 | Discharge: 2015-06-24 | Disposition: A | Discharge status: Home / Self Care | Payer: Medicaid Other | Source: Ambulatory Visit | Attending: Internal Medicine | Admitting: Internal Medicine

## 2015-06-24 VITALS — BP 112/73 | HR 63 | Temp 97.8°F | Wt 112.0 lb

## 2015-06-24 DIAGNOSIS — Z113 Encounter for screening for infections with a predominantly sexual mode of transmission: Secondary | ICD-10-CM | POA: Insufficient documentation

## 2015-06-24 DIAGNOSIS — B182 Chronic viral hepatitis C: Secondary | ICD-10-CM | POA: Diagnosis present

## 2015-06-24 DIAGNOSIS — B20 Human immunodeficiency virus [HIV] disease: Secondary | ICD-10-CM | POA: Diagnosis not present

## 2015-06-24 DIAGNOSIS — F1910 Other psychoactive substance abuse, uncomplicated: Secondary | ICD-10-CM | POA: Diagnosis not present

## 2015-06-24 DIAGNOSIS — Z79899 Other long term (current) drug therapy: Secondary | ICD-10-CM | POA: Diagnosis not present

## 2015-06-24 LAB — CBC WITH DIFFERENTIAL/PLATELET
BASOS PCT: 0 %
Basophils Absolute: 0 cells/uL (ref 0–200)
EOS ABS: 81 {cells}/uL (ref 15–500)
Eosinophils Relative: 3 %
HEMATOCRIT: 33.4 % — AB (ref 35.0–45.0)
Hemoglobin: 11.1 g/dL — ABNORMAL LOW (ref 11.7–15.5)
Lymphocytes Relative: 47 %
Lymphs Abs: 1269 cells/uL (ref 850–3900)
MCH: 32.6 pg (ref 27.0–33.0)
MCHC: 33.2 g/dL (ref 32.0–36.0)
MCV: 97.9 fL (ref 80.0–100.0)
MONO ABS: 297 {cells}/uL (ref 200–950)
MPV: 10.2 fL (ref 7.5–12.5)
Monocytes Relative: 11 %
NEUTROS ABS: 1053 {cells}/uL — AB (ref 1500–7800)
Neutrophils Relative %: 39 %
Platelets: 130 10*3/uL — ABNORMAL LOW (ref 140–400)
RBC: 3.41 MIL/uL — ABNORMAL LOW (ref 3.80–5.10)
RDW: 14.1 % (ref 11.0–15.0)
WBC: 2.7 10*3/uL — ABNORMAL LOW (ref 3.8–10.8)

## 2015-06-24 LAB — COMPLETE METABOLIC PANEL WITH GFR
ALT: 50 U/L — AB (ref 6–29)
AST: 49 U/L — ABNORMAL HIGH (ref 10–35)
Albumin: 3.9 g/dL (ref 3.6–5.1)
Alkaline Phosphatase: 54 U/L (ref 33–130)
BUN: 9 mg/dL (ref 7–25)
CHLORIDE: 108 mmol/L (ref 98–110)
CO2: 24 mmol/L (ref 20–31)
CREATININE: 1.09 mg/dL — AB (ref 0.50–1.05)
Calcium: 9.1 mg/dL (ref 8.6–10.4)
GFR, EST AFRICAN AMERICAN: 68 mL/min (ref >=60)
GFR, EST NON AFRICAN AMERICAN: 59 mL/min — AB (ref >=60)
Glucose, Bld: 76 mg/dL (ref 65–99)
Potassium: 4.6 mmol/L (ref 3.5–5.3)
Sodium: 140 mmol/L (ref 135–146)
Total Bilirubin: 0.3 mg/dL (ref 0.2–1.2)
Total Protein: 6.6 g/dL (ref 6.1–8.1)

## 2015-06-24 LAB — LIPID PANEL
CHOL/HDL RATIO: 2.1 ratio (ref <=5.0)
Cholesterol: 156 mg/dL (ref 125–200)
HDL: 73 mg/dL (ref >=46)
LDL CALC: 67 mg/dL (ref <130)
Triglycerides: 80 mg/dL (ref <150)
VLDL: 16 mg/dL (ref <30)

## 2015-06-24 MED ORDER — DOLUTEGRAVIR SODIUM 50 MG PO TABS: 50.0000 mg | ORAL_TABLET | Freq: Every day | ORAL | Status: DC

## 2015-06-24 MED ORDER — EMTRICITABINE-TENOFOVIR AF 200-25 MG PO TABS: 1.0000 | ORAL_TABLET | Freq: Every day | ORAL | Status: DC

## 2015-06-24 NOTE — Assessment & Plan Note (Addendum)
Remains drug free but does report it is a struggle.  Encouraged her to continue off of drugs and offered continued support

## 2015-06-24 NOTE — Assessment & Plan Note (Signed)
She says she is back on ARVs, labs today and if ok, will rtc 2 months

## 2015-06-24 NOTE — Progress Notes (Signed)
CC: Follow up for HIV  Interval history: Currently is on Tivicay and Descovy.  She has a long history of poor compliance, poor control and substance abuse.  She also has hepatitis C and has not yet been treated.  Since last visit she has been monitored by our home RN to help her with her medications, substance abuse.  She is also seeing a PCP, Dr. Criss Rosales who sent her recently to [facility] for her hepatitis C.  Has no associated weight loss, though does report a poor appetite.  Her weight though is 5 lbs higher than last visit.  denies missed doses recently since starting the medication recenlty. Last CD4 of 270 and viral load of 29,469, was off of treatment at the time.  Is intrested in hep C treatment but understands she needs to remain drug free and be controlled on her HIV medication.   Prior to Admission medications   Medication Sig Start Date End Date Taking? Authorizing Provider  acetaminophen (TYLENOL) 325 MG tablet Take 650 mg by mouth every 6 (six) hours as needed for mild pain. Reported on 06/16/2015   Yes Historical Provider, MD  amLODipine (NORVASC) 10 MG tablet Take 1 tablet (10 mg total) by mouth at bedtime. 05/04/14  Yes Kerrie Buffalo, NP  barrier cream (NON-SPECIFIED) CREA Apply 1 application topically 2 (two) times daily as needed. 05/04/14  Yes Kerrie Buffalo, NP  cloNIDine (CATAPRES) 0.2 MG tablet Take 0.2 mg by mouth 3 (three) times daily. 02/17/15  Yes Historical Provider, MD  dicyclomine (BENTYL) 20 MG tablet Take 1 tablet (20 mg total) by mouth 2 (two) times daily as needed for spasms. 05/09/14  Yes Julianne Rice, MD  dolutegravir (TIVICAY) 50 MG tablet Take 1 tablet (50 mg total) by mouth daily. 06/24/15  Yes Thayer Headings, MD  emtricitabine-tenofovir AF (DESCOVY) 200-25 MG tablet Take 1 tablet by mouth daily. 06/24/15  Yes Thayer Headings, MD  fexofenadine (ALLEGRA) 180 MG tablet Take 180 mg by mouth daily.   Yes Historical Provider, MD  FLUoxetine (PROZAC) 20 MG capsule Take 1  capsule (20 mg total) by mouth daily. 05/04/14  Yes Kerrie Buffalo, NP  lisinopril (PRINIVIL,ZESTRIL) 40 MG tablet Take 40 mg by mouth daily. 03/19/15  Yes Historical Provider, MD  loratadine (CLARITIN) 10 MG tablet Take 1 tablet (10 mg total) by mouth daily. 05/04/14  Yes Kerrie Buffalo, NP  Multiple Vitamin (MULTIVITAMIN) tablet Take 1 tablet by mouth daily. 02/02/15  Yes Thayer Headings, MD  nystatin (MYCOSTATIN/NYSTOP) 100000 UNIT/GM POWD Apply to groin BID 05/04/14  Yes Kerrie Buffalo, NP  polyethylene glycol (MIRALAX / GLYCOLAX) packet Take 17 g by mouth daily. 05/09/14  Yes Julianne Rice, MD  QUEtiapine (SEROQUEL) 25 MG tablet Take 1 tablet (25 mg total) by mouth 2 (two) times daily. 05/04/14  Yes Kerrie Buffalo, NP  traMADol (ULTRAM) 50 MG tablet Take 50 mg by mouth every 6 (six) hours as needed for moderate pain. Reported on 06/16/2015   Yes Historical Provider, MD  traZODone (DESYREL) 100 MG tablet Take 1 tablet (100 mg total) by mouth at bedtime. 05/04/14  Yes Kerrie Buffalo, NP  vitamin E 400 UNIT capsule Take 400 Units by mouth daily. 04/13/15  Yes Historical Provider, MD    Review of Systems Constitutional: negative for fevers and chills Gastrointestinal: negative for diarrhea Musculoskeletal: negative for myalgias and arthralgias All other systems reviewed and are negative   Physical Exam: CONSTITUTIONAL:in no apparent distress and alert  Filed Vitals:  06/24/15 0938  BP: 112/73  Pulse: 63  Temp: 97.8 F (36.6 C)   Eyes: anicteric HENT: no thrush, no cervical lymphadenopathy Respiratory: Normal respiratory effort; CTA B GI: soft, nt  Lab Results  Component Value Date   HIV1RNAQUANT [id number]* 12/24/2014   HIV1RNAQUANT [id number]* 01/20/2014   HIV1RNAQUANT [id number]* 06/18/2013   No components found for: HIV1GENOTYPRPLUS No components found for: THELPERCELL

## 2015-06-24 NOTE — Assessment & Plan Note (Signed)
Will check her elastography and have her rtc 2 months. If her HIV is controlled, can consider treatement then and can sign readiness form if she is drug free.  Will update her labs for hep C as well at the next visit.

## 2015-06-25 LAB — T-HELPER CELL (CD4) - (RCID CLINIC ONLY)
CD4 % Helper T Cell: 27 % — ABNORMAL LOW (ref 33–55)
CD4 T Cell Abs: 340 /uL — ABNORMAL LOW (ref 400–2700)

## 2015-06-25 LAB — HIV-1 RNA QUANT-NO REFLEX-BLD
HIV 1 RNA QUANT: 45 {copies}/mL — AB (ref <20)
HIV-1 RNA QUANT, LOG: 1.65 {Log_copies}/mL — AB (ref <1.30)

## 2015-06-25 LAB — URINE CYTOLOGY ANCILLARY ONLY
Chlamydia: NEGATIVE
Neisseria Gonorrhea: NEGATIVE

## 2015-06-25 LAB — RPR

## 2015-06-29 ENCOUNTER — Ambulatory Visit (HOSPITAL_COMMUNITY)
Admission: RE | Admit: 2015-06-29 | Discharge: 2015-06-29 | Disposition: A | Discharge status: Home / Self Care | Payer: Medicaid Other | Source: Ambulatory Visit | Attending: Nurse Practitioner | Admitting: Nurse Practitioner

## 2015-06-29 DIAGNOSIS — B182 Chronic viral hepatitis C: Secondary | ICD-10-CM | POA: Insufficient documentation

## 2015-07-01 ENCOUNTER — Telehealth: Payer: Self-pay | Admitting: *Deleted

## 2015-07-01 ENCOUNTER — Other Ambulatory Visit: Payer: Self-pay | Admitting: Internal Medicine

## 2015-07-01 MED ORDER — MEGESTROL ACETATE 625 MG/5ML PO SUSP: 625.0000 mg | Freq: Every day | ORAL | Status: DC

## 2015-07-01 NOTE — Telephone Encounter (Signed)
Yes,I did say that but have to use megace since she has medicaid.  I sent the prescription.  thanks

## 2015-07-01 NOTE — Telephone Encounter (Signed)
Patient called triage, asking for the prescription marinol she states Dr. Linus Salmons offered at the last office visit.  No active rx, no mention of this script.  Patient with decreased appetite, but increased weight. Please advise. Landis Gandy, RN

## 2015-07-02 ENCOUNTER — Telehealth: Payer: Self-pay | Admitting: *Deleted

## 2015-07-02 NOTE — Telephone Encounter (Signed)
Patient called to enquire about her personal care services form. Per Rodman Key, LPN, Dr. Linus Salmons did have the form and declined signing for this. She did not understand why and stated he signed it previously. Advised her to try her PCP. She asked that we send it to [facility]. Advised her to call the PCS and request they fax to her PCP.

## 2015-07-02 NOTE — Telephone Encounter (Signed)
Patient notified

## 2015-07-15 NOTE — Telephone Encounter (Signed)
Patient ID: Ashley [name], female   DOB: 1966/02/21, 50 y.o.   MRN: [id number] RN meet with the patient in her home. RN reminded the patient of my services and that I would like to continue to see her to work on medication adherence. Patient was in agreement to have the services and wanted a better understanding of what a abnormal PAP smear means. RN explained the many explanations for a abnormal PAP smear. Patient immediately became upset. RN explained to the patient that a abnormal PAP smear does not mean she has cervical cancer it just means further investigation is needed. RN contacted [facility] for follow up to Abnormal PAP Smear. While attempting to schedule the appt the receptionist stated they clinic will only accept cash payment they will not be able to bill her insurance. Patient cannot afford this option. RN then contacted [facility] who stated they can accept the patient along with bill her medicaid if we can send over a referral. RN then sent a message to Ashley Millers RN asking for a referral to be sent to [facility] for Ms. Ashley [name]. Plan at this time is to develop the patient centered plan of care  Order received 02/02/2015 by Dr. Linus Salmons to evaluate patient for [facility]). 1st Contact was made on 02/04/2015 Patient was evaluated with initiation of services on 04/08/2015 for [facility]. Patient was consented to care at this time.   Frequency / Duration of [facility] visits: Effective 04/08/2015: 17mo1, 52mo1, 7mo1, 3PRN's for complications with disease process or RN identifies barriers that will interfere with medication adherence/compliance  [facility] will assess for learning needs related to diagnosis and treatment regimen, provide education as needed, fill pill box with each visit, fill syringes if liquid medication is required, communicate with care team including physician and case manager.  Individualized Plan Of Care,  Certification Period 04/08/2015- 07/07/2015 a. Type of service(s) and care to be delivered: RN, [place] Nurse b. Frequency and duration of service:Effective 04/08/2015: 32mo1, 77mo1, 53mo1, 3PRN's for complications with disease process or RN identifies barriers that will interfere with medication adherence/compliance c. Activity restrictions: None Noted d. Safety Measures:Standard precautions/Infection Control e. Service Objectives and Goals:Service Objectives are to assist the pt with HIV medication regimen adherence and staying in care with the Infectious Disease Clinic by identifying barriers to care. RN will address the barriers that are identified by the patient. Patient stated goals is to begin to take her medications and no longer do dope.. RN will assist the patient by offering medication management which includes weekly to biweekly pillbox fills of her oral medications and resources for Substance Abuse Conseling/rehabilitation.  RN will coordinate care with [place] to ensure medications are being requested and shipped to the patient. Focus of care will be medication adherence and compliance f. Equipment required: No additional equipment needs noted g. Functional Limitations: No functional limitations noted or expressed h. Rehabilitation potential: Guarded i. Diet and Nutritional Needs:Regular diet j. Medications and treatments: Medications have been reviewed and reconciled. Medications list is a part of this, EPIC, Electronic Charting System k. Specific therapies if needed: None l. Pertinent diagnoses:Hx of medication noncompliance, HIV disease, Depression m. Expected outcome: Guarded

## 2015-07-15 NOTE — Telephone Encounter (Signed)
I approve of the POC as outlined 

## 2015-07-19 ENCOUNTER — Ambulatory Visit: Payer: Self-pay | Admitting: *Deleted

## 2015-07-19 VITALS — BP 108/72 | HR 96 | Temp 97.4°F | Resp 17

## 2015-07-19 DIAGNOSIS — B20 Human immunodeficiency virus [HIV] disease: Secondary | ICD-10-CM

## 2015-07-30 ENCOUNTER — Telehealth: Payer: Self-pay | Admitting: *Deleted

## 2015-07-30 NOTE — Telephone Encounter (Signed)
PA received for MEGACE ES - not covered by Medicaid.  Medicaid covers 20 MG and 40 MG tablets.  Dr. Linus Salmons please advise.

## 2015-07-30 NOTE — Telephone Encounter (Signed)
Ok, 40 mg, twice a day, #60 3 refills. thanks

## 2015-08-02 MED ORDER — MEGESTROL ACETATE 40 MG PO TABS: 40.0000 mg | ORAL_TABLET | Freq: Two times a day (BID) | ORAL | Status: DC

## 2015-08-02 NOTE — Addendum Note (Signed)
Addended by: Lorne Skeens D on: 08/02/2015 04:59 PM   Modules accepted: Orders, Medications

## 2015-08-11 ENCOUNTER — Telehealth: Payer: Self-pay | Admitting: *Deleted

## 2015-08-11 NOTE — Telephone Encounter (Signed)
Patient ID: Ashley [name], female   DOB: 1966/02/24, 50 y.o.   MRN: [id number]  BVerbal order received from Dr Linus Salmons approving a late recertification.  Frequency / Duration of [facility] visits: Effective 07/19/2015: 24mo1, 25mo2, 92mo1, 3PRN's for complications with disease process or RN identifies barriers that will interfere with medication adherence/compliance  [facility] will assess for learning needs related to diagnosis and treatment regimen, provide education as needed, fill pill box with each visit, fill syringes if liquid medication is required, communicate with care team including physician and case manager.  Individualized Plan Of Care, Certification Period 07/19/2015- 10/17/2015 a. Type of service(s) and care to be delivered: RN, [place] Nurse b. Frequency and duration of service:Effective 04/08/2015: 29mo1, 34mo2, 3mo1, 3PRN's for complications with disease process or RN identifies barriers that will interfere with medication adherence/compliance c. Activity restrictions: None Noted d. Safety Measures:Standard precautions/Infection Control e. Service Objectives and Goals:Service Objectives are to assist the pt with HIV medication regimen adherence and staying in care with the Infectious Disease Clinic by identifying barriers to care. RN will address the barriers that are identified by the patient. Patient stated goals is to begin to take her medications and no longer do dope.. RN will assist the patient by offering medication management which includes weekly to biweekly pillbox fills of her oral medications and resources for Substance Abuse Conseling/rehabilitation.  RN will coordinate care with [place] to ensure medications are being requested and shipped to the patient. Focus of care will be medication adherence and compliance f. Equipment required: No additional equipment needs noted g. Functional Limitations: No functional limitations noted or  expressed h. Rehabilitation potential: Guarded i. Diet and Nutritional Needs:Regular diet j. Medications and treatments: Medications have been reviewed and reconciled. Medications list is a part of this, EPIC, Electronic Charting System k. Specific therapies if needed: None l. Pertinent diagnoses:Hx of medication noncompliance, HIV disease, Depression m. Expected outcome: Guarded

## 2015-08-11 NOTE — Progress Notes (Signed)
Patient ID: Ashley [name], female   DOB: Oct 10, 1965, 50 y.o.   MRN: [id number]  BVerbal order received from Dr Linus Salmons approving a late recertification.  Frequency / Duration of [facility] visits: Effective 07/19/2015: 53mo1, 61mo2, 38mo1, 3PRN's for complications with disease process or RN identifies barriers that will interfere with medication adherence/compliance  [facility] will assess for learning needs related to diagnosis and treatment regimen, provide education as needed, fill pill box with each visit, fill syringes if liquid medication is required, communicate with care team including physician and case manager.  Individualized Plan Of Care, Certification Period 07/19/2015- 10/17/2015 a. Type of service(s) and care to be delivered: RN, [place] Nurse b. Frequency and duration of service:Effective 04/08/2015: 79mo1, 39mo2, 68mo1, 3PRN's for complications with disease process or RN identifies barriers that will interfere with medication adherence/compliance c. Activity restrictions: None Noted d. Safety Measures:Standard precautions/Infection Control e. Service Objectives and Goals:Service Objectives are to assist the pt with HIV medication regimen adherence and staying in care with the Infectious Disease Clinic by identifying barriers to care. RN will address the barriers that are identified by the patient. Patient stated goals is to begin to take her medications and no longer do dope.. RN will assist the patient by offering medication management which includes weekly to biweekly pillbox fills of her oral medications and resources for Substance Abuse Conseling/rehabilitation.  RN will coordinate care with [place] to ensure medications are being requested and shipped to the patient. Focus of care will be medication adherence and compliance f. Equipment required: No additional equipment needs noted g. Functional Limitations: No functional limitations noted or  expressed h. Rehabilitation potential: Guarded i. Diet and Nutritional Needs:Regular diet j. Medications and treatments: Medications have been reviewed and reconciled. Medications list is a part of this, EPIC, Electronic Charting System k. Specific therapies if needed: None l. Pertinent diagnoses:Hx of medication noncompliance, HIV disease, Depression m. Expected outcome: Guarded

## 2015-08-11 NOTE — Patient Instructions (Signed)
RN made a home recertification visit today with Ms. Sleeper. Today Ms Balderrama was very hyperactive and actually jumped into my lap during our visit. She appears slightly manic today. Ariyannah did state she has been having trouble getting her antibiotic from the pharmacy. Rn contacted the pharmacy([facility]) and they stated they will waive her copay since she has medicaid. RN took the patient around the corner to her home to [facility] and received her antibiotic. Pharmacy stated they are waiting on a PA for her marinol. RN also donated some toilet tissue to the patient.

## 2015-08-12 NOTE — Telephone Encounter (Signed)
I approve the outlined POC as listed.

## 2015-09-24 ENCOUNTER — Ambulatory Visit: Payer: Medicaid Other | Admitting: Certified Nurse Midwife

## 2015-10-06 ENCOUNTER — Other Ambulatory Visit: Payer: Self-pay | Admitting: Internal Medicine

## 2015-10-13 ENCOUNTER — Ambulatory Visit: Payer: Medicaid Other | Admitting: Certified Nurse Midwife

## 2015-10-20 ENCOUNTER — Other Ambulatory Visit: Payer: Self-pay | Admitting: *Deleted

## 2015-10-20 MED ORDER — ONE-DAILY MULTI VITAMINS PO TABS: 1.0000 | ORAL_TABLET | Freq: Every day | ORAL | 5 refills | Status: DC

## 2015-10-22 ENCOUNTER — Other Ambulatory Visit: Payer: Self-pay | Admitting: Internal Medicine

## 2015-10-29 ENCOUNTER — Ambulatory Visit: Payer: Self-pay | Admitting: *Deleted

## 2015-10-29 DIAGNOSIS — B20 Human immunodeficiency virus [HIV] disease: Secondary | ICD-10-CM

## 2015-11-10 ENCOUNTER — Ambulatory Visit (INDEPENDENT_AMBULATORY_CARE_PROVIDER_SITE_OTHER): Payer: Medicaid Other | Admitting: Certified Nurse Midwife

## 2015-11-10 ENCOUNTER — Encounter: Payer: Self-pay | Admitting: Certified Nurse Midwife

## 2015-11-10 VITALS — BP 103/67 | HR 77 | Wt 120.0 lb

## 2015-11-10 DIAGNOSIS — Z01419 Encounter for gynecological examination (general) (routine) without abnormal findings: Secondary | ICD-10-CM | POA: Diagnosis not present

## 2015-11-10 DIAGNOSIS — Z0000 Encounter for general adult medical examination without abnormal findings: Secondary | ICD-10-CM

## 2015-11-10 NOTE — Progress Notes (Signed)
Subjective:      Ashley [name] is a 50 y.o. female here for a routine exam.  Established care on 06/16/15 for f/u on abnormal pap smear.  Patient had abnormal pap smear on 03/26/2015 of ASCUS and +HPV.  Has a hx of HIV/Hep.C.  Patient is worried.  Discussed results of last pap smear.  Encouraged patient to f/u in 4 months for another pap smear, due to timing of last pap smear.  Patient agreed to plan.  Is sexually active, uses condoms.  Denies any vaginal bleeding.  Post menopausal status. Denies any current vaginal bleeding/discharge.    Personal health questionnaire:  Is patient Ashkenazi Jewish, have a family history of breast and/or ovarian cancer: no Is there a family history of uterine cancer diagnosed at age < 23, gastrointestinal cancer, urinary tract cancer, family member who is a Field seismologist syndrome-associated carrier: no Is the patient overweight and hypertensive, family history of diabetes, personal history of gestational diabetes, preeclampsia or PCOS: yes Is patient over 78, have PCOS,  family history of premature CHD under age 45, diabetes, smoke, have hypertension or peripheral artery disease:  yes At any time, has a partner hit, kicked or otherwise hurt or frightened you?: no Over the past 2 weeks, have you felt down, depressed or hopeless?: no Over the past 2 weeks, have you felt little interest or pleasure in doing things?:no   Gynecologic History No LMP recorded. Patient is postmenopausal. Contraception: post menopausal status Last Pap: 03/26/15. Results were: ASCUS +HPV Last mammogram: 05/03/15. Results were: normal  Obstetric History OB History  Gravida Para Term Preterm AB Living  2 2       2   SAB TAB Ectopic Multiple Live Births               # Outcome Date GA Lbr Len/2nd Weight Sex Delivery Anes PTL Lv  2 Para      CS-LTranv     1 Para      CS-LTranv         Past Medical History:  Diagnosis Date  . Ankle fracture   . Anxiety   . Asthma   . Collagen vascular  disease ([place])   . Depression   . Diabetes mellitus   . Hepatitis C   . Hernia   . HIV (human immunodeficiency virus infection) ([place])   . HIV (human immunodeficiency virus infection) ([place])   . Hypertension   . Pinched nerve   . Scoliosis   . TB (tuberculosis)     Past Surgical History:  Procedure Laterality Date  . CESAREAN SECTION    . CHOLECYSTECTOMY    . HERNIA REPAIR    . LIVER BIOPSY       Current Outpatient Prescriptions:  .  amLODipine (NORVASC) 10 MG tablet, Take 1 tablet (10 mg total) by mouth at bedtime., Disp: 30 tablet, Rfl: 0 .  barrier cream (NON-SPECIFIED) CREA, Apply 1 application topically 2 (two) times daily as needed., Disp: , Rfl:  .  cloNIDine (CATAPRES) 0.2 MG tablet, Take 0.2 mg by mouth 3 (three) times daily., Disp: , Rfl:  .  dicyclomine (BENTYL) 20 MG tablet, Take 1 tablet (20 mg total) by mouth 2 (two) times daily as needed for spasms., Disp: 20 tablet, Rfl: 0 .  dolutegravir (TIVICAY) 50 MG tablet, Take 1 tablet (50 mg total) by mouth daily., Disp: 30 tablet, Rfl: 5 .  emtricitabine-tenofovir AF (DESCOVY) 200-25 MG tablet, Take 1 tablet by mouth daily., Disp: 30 tablet,  Rfl: 5 .  fexofenadine (ALLEGRA) 180 MG tablet, Take 180 mg by mouth daily., Disp: , Rfl:  .  FLUoxetine (PROZAC) 20 MG capsule, Take 1 capsule (20 mg total) by mouth daily., Disp: 30 capsule, Rfl: 0 .  lisinopril (PRINIVIL,ZESTRIL) 40 MG tablet, Take 40 mg by mouth daily., Disp: , Rfl:  .  loratadine (CLARITIN) 10 MG tablet, Take 1 tablet (10 mg total) by mouth daily., Disp: 30 tablet, Rfl: 0 .  megestrol (MEGACE) 40 MG tablet, Take 1 tablet (40 mg total) by mouth 2 (two) times daily., Disp: 60 tablet, Rfl: 3 .  methocarbamol (ROBAXIN) 500 MG tablet, Take 1-2 tablets every 12 hours as needed for muscle spasms, Disp: , Rfl:  .  Multiple Vitamin (MULTIVITAMIN) tablet, Take 1 tablet by mouth daily., Disp: 30 tablet, Rfl: 5 .  naproxen sodium (ANAPROX) 550 MG tablet, Take one tablet every 12  hours as needed for inflammation with food., Disp: , Rfl:  .  nystatin (MYCOSTATIN/NYSTOP) 100000 UNIT/GM POWD, Apply to groin BID, Disp: , Rfl: 0 .  polyethylene glycol (MIRALAX / GLYCOLAX) packet, Take 17 g by mouth daily., Disp: 14 each, Rfl: 0 .  QUEtiapine (SEROQUEL) 25 MG tablet, Take 1 tablet (25 mg total) by mouth 2 (two) times daily., Disp: 60 tablet, Rfl: 0 .  traMADol (ULTRAM) 50 MG tablet, Take 50 mg by mouth every 6 (six) hours as needed for moderate pain. Reported on 06/16/2015, Disp: , Rfl:  .  traZODone (DESYREL) 100 MG tablet, Take 1 tablet (100 mg total) by mouth at bedtime., Disp: 30 tablet, Rfl: 0 .  vitamin E 400 UNIT capsule, Take 400 Units by mouth daily., Disp: , Rfl:  .  acetaminophen (TYLENOL) 325 MG tablet, Take 650 mg by mouth every 6 (six) hours as needed for mild pain. Reported on 06/16/2015, Disp: , Rfl:  .  cetirizine (ZYRTEC) 10 MG tablet, Take by mouth., Disp: , Rfl:  Allergies  Allergen Reactions  . Aspirin Other (See Comments)    Liver problems    Social History  Substance Use Topics  . Smoking status: Current Every Day Smoker    Packs/day: 1.00    Years: 35.00    Types: Cigarettes  . Smokeless tobacco: Never Used     Comment: 1 pack every 2-3 days  . Alcohol use No    Family History  Problem Relation Age of Onset  . Diabetes Mother   . Hypertension Mother   . Stroke Mother     died 30-Aug-2013     Review of Systems  Constitutional: negative for fatigue and weight loss Respiratory: negative for cough and wheezing Cardiovascular: negative for chest pain, fatigue and palpitations Gastrointestinal: negative for abdominal pain and change in bowel habits Musculoskeletal:negative for myalgias Neurological: negative for gait problems and tremors Behavioral/Psych: negative for abusive relationship, depression Endocrine: negative for temperature intolerance   Genitourinary:negative for abnormal menstrual periods, genital lesions, hot flashes, sexual  problems and vaginal discharge Integument/breast: negative for breast lump, breast tenderness, nipple discharge and skin lesion(s)    Objective:       BP 103/67   Pulse 77   Wt 120 lb (54.4 kg)   BMI 27.89 kg/m  General:   alert  Skin:   no rash or abnormalities  Lungs:   clear to auscultation bilaterally  Heart:   regular rate and rhythm, S1, S2 normal, no murmur, click, rub or gallop  Breasts:   normal without suspicious masses, skin or nipple  changes or axillary nodes  Abdomen:  normal findings: enlarged liver, soft, non-tender and no hernia  Pelvis:  External genitalia: normal general appearance Urinary system: urethral meatus normal and bladder without fullness, nontender Vaginal: normal without tenderness, induration or masses Cervix: normal appearance Adnexa: normal bimanual exam Uterus: anteverted and non-tender, normal size   Lab Review Urine pregnancy test Labs reviewed yes Radiologic studies reviewed yes  50% of 30 min visit spent on counseling and coordination of care.   Assessment:    Healthy female exam.   H/O substance abuse (cocaine, THC), HIV+ status, Hep C+ status  H/O ASCUS +HPV  Tobacco abuse  Plan:    Education reviewed: calcium supplements, depression evaluation, low fat, low cholesterol diet, safe sex/STD prevention, self breast exams, skin cancer screening and weight bearing exercise. Contraception: post menopausal status. Follow up in: 1 year.  Encouraged f/u with PCP for smoking cessation  Meds ordered this encounter  Medications  . cetirizine (ZYRTEC) 10 MG tablet    Sig: Take by mouth.  . methocarbamol (ROBAXIN) 500 MG tablet    Sig: Take 1-2 tablets every 12 hours as needed for muscle spasms  . naproxen sodium (ANAPROX) 550 MG tablet    Sig: Take one tablet every 12 hours as needed for inflammation with food.  Marland Kitchen DISCONTD: amLODipine (NORVASC) 10 MG tablet    Sig: Take by mouth.  . DISCONTD: cloNIDine (CATAPRES) 0.2 MG tablet     Sig: Take by mouth.  . DISCONTD: lisinopril (PRINIVIL,ZESTRIL) 40 MG tablet    Sig: Take by mouth.   No orders of the defined types were placed in this encounter.   Possible management options include:colpo for +HPV Follow up as needed.

## 2015-11-11 ENCOUNTER — Encounter: Payer: Self-pay | Admitting: Internal Medicine

## 2015-11-11 ENCOUNTER — Ambulatory Visit (INDEPENDENT_AMBULATORY_CARE_PROVIDER_SITE_OTHER): Payer: Medicaid Other | Admitting: Internal Medicine

## 2015-11-11 VITALS — BP 121/80 | HR 71 | Temp 98.1°F | Ht <= 58 in | Wt 119.0 lb

## 2015-11-11 DIAGNOSIS — K7400 Hepatic fibrosis, unspecified: Secondary | ICD-10-CM

## 2015-11-11 DIAGNOSIS — F1910 Other psychoactive substance abuse, uncomplicated: Secondary | ICD-10-CM | POA: Diagnosis not present

## 2015-11-11 DIAGNOSIS — B1920 Unspecified viral hepatitis C without hepatic coma: Secondary | ICD-10-CM | POA: Diagnosis not present

## 2015-11-11 DIAGNOSIS — B20 Human immunodeficiency virus [HIV] disease: Secondary | ICD-10-CM

## 2015-11-11 MED ORDER — MEGESTROL ACETATE 40 MG PO TABS: 40.0000 mg | ORAL_TABLET | Freq: Two times a day (BID) | ORAL | 5 refills | Status: DC

## 2015-11-11 NOTE — Progress Notes (Signed)
CC: Follow up for HIV  Interval history: Currently is on Tivicay and Descovy.  Ashley [name] has a long history of poor compliance, poor control and substance abuse.  Ashley [name] also has hepatitis C and has not yet been treated or qualified for treatment based on medicaid rules.  Since last visit Ashley [name] has been monitored by our home RN to help her with her medications, substance abuse.  Ashley [name] is also seeing a PCP, Dr. Criss Rosales and saw GYN. Last CD4 of 340 and viral load down to 45 copies but was off her medication until about 2 months ago.  Ashley [name] is intrested in hep C treatment but understands Ashley [name] needs to remain drug free and be controlled on her HIV medication. Last drug use 2 months ago.  Ashley [name] is tearful today due to financial issues and feels Ashley [name] does not get enough from disability.   Prior to Admission medications   Medication Sig Start Date End Date Taking? Authorizing Provider  acetaminophen (TYLENOL) 325 MG tablet Take 650 mg by mouth every 6 (six) hours as needed for mild pain. Reported on 06/16/2015   Yes Historical Provider, MD  amLODipine (NORVASC) 10 MG tablet Take 1 tablet (10 mg total) by mouth at bedtime. 05/04/14  Yes Kerrie Buffalo, NP  barrier cream (NON-SPECIFIED) CREA Apply 1 application topically 2 (two) times daily as needed. 05/04/14  Yes Kerrie Buffalo, NP  cloNIDine (CATAPRES) 0.2 MG tablet Take 0.2 mg by mouth 3 (three) times daily. 02/17/15  Yes Historical Provider, MD  dicyclomine (BENTYL) 20 MG tablet Take 1 tablet (20 mg total) by mouth 2 (two) times daily as needed for spasms. 05/09/14  Yes Julianne Rice, MD  dolutegravir (TIVICAY) 50 MG tablet Take 1 tablet (50 mg total) by mouth daily. 06/24/15  Yes Thayer Headings, MD  emtricitabine-tenofovir AF (DESCOVY) 200-25 MG tablet Take 1 tablet by mouth daily. 06/24/15  Yes Thayer Headings, MD  fexofenadine (ALLEGRA) 180 MG tablet Take 180 mg by mouth daily.   Yes Historical Provider, MD  FLUoxetine (PROZAC) 20 MG capsule Take 1 capsule (20 mg total) by mouth  daily. 05/04/14  Yes Kerrie Buffalo, NP  lisinopril (PRINIVIL,ZESTRIL) 40 MG tablet Take 40 mg by mouth daily. 03/19/15  Yes Historical Provider, MD  loratadine (CLARITIN) 10 MG tablet Take 1 tablet (10 mg total) by mouth daily. 05/04/14  Yes Kerrie Buffalo, NP  Multiple Vitamin (MULTIVITAMIN) tablet Take 1 tablet by mouth daily. 02/02/15  Yes Thayer Headings, MD  nystatin (MYCOSTATIN/NYSTOP) 100000 UNIT/GM POWD Apply to groin BID 05/04/14  Yes Kerrie Buffalo, NP  polyethylene glycol (MIRALAX / GLYCOLAX) packet Take 17 g by mouth daily. 05/09/14  Yes Julianne Rice, MD  QUEtiapine (SEROQUEL) 25 MG tablet Take 1 tablet (25 mg total) by mouth 2 (two) times daily. 05/04/14  Yes Kerrie Buffalo, NP  traMADol (ULTRAM) 50 MG tablet Take 50 mg by mouth every 6 (six) hours as needed for moderate pain. Reported on 06/16/2015   Yes Historical Provider, MD  traZODone (DESYREL) 100 MG tablet Take 1 tablet (100 mg total) by mouth at bedtime. 05/04/14  Yes Kerrie Buffalo, NP  vitamin E 400 UNIT capsule Take 400 Units by mouth daily. 04/13/15  Yes Historical Provider, MD    Review of Systems Constitutional: negative for fevers and chills Gastrointestinal: negative for diarrhea Musculoskeletal: negative for myalgias and arthralgias All other systems reviewed and are negative   Physical Exam: CONSTITUTIONAL:in no apparent distress but tearful Vitals:   11/11/15 0939  BP: 121/80  Pulse: 71  Temp: 98.1 F (36.7 C)   Eyes: anicteric HENT: no thrush, no cervical lymphadenopathy Respiratory: Normal respiratory effort; CTA B GI: soft, nt  Lab Results  Component Value Date   HIV1RNAQUANT 45 (H) 06/24/2015   HIV1RNAQUANT 29,469 (H) 12/24/2014   HIV1RNAQUANT 28,069 (H) 01/20/2014   No components found for: HIV1GENOTYPRPLUS No components found for: THELPERCELL  SHx: drug free again two months, support from church members

## 2015-11-11 NOTE — Assessment & Plan Note (Signed)
Not yet qulified for treatment per [employer].  Will readdress next visit if her HIV is suppressed

## 2015-11-11 NOTE — Assessment & Plan Note (Signed)
No indication for [facility] screening

## 2015-11-11 NOTE — Assessment & Plan Note (Signed)
Hopefully suppressed but with stopping and starting, missed doses, is at high risk of resistance.

## 2015-11-11 NOTE — Assessment & Plan Note (Signed)
Encouraged continued abstinence. 

## 2015-11-12 LAB — HIV-1 RNA QUANT-NO REFLEX-BLD
HIV 1 RNA Quant: 22 copies/mL — ABNORMAL HIGH (ref <20)
HIV-1 RNA QUANT, LOG: 1.34 {Log_copies}/mL — AB (ref <1.30)

## 2015-11-12 LAB — T-HELPER CELL (CD4) - (RCID CLINIC ONLY)
CD4 T CELL ABS: 440 /uL (ref 400–2700)
CD4 T CELL HELPER: 24 % — AB (ref 33–55)

## 2015-11-13 LAB — NUSWAB VG+, CANDIDA 6SP
CANDIDA KRUSEI, NAA: NEGATIVE
CHLAMYDIA TRACHOMATIS, NAA: NEGATIVE
Candida albicans, NAA: NEGATIVE
Candida glabrata, NAA: NEGATIVE
Candida lusitaniae, NAA: NEGATIVE
Candida parapsilosis, NAA: NEGATIVE
Candida tropicalis, NAA: NEGATIVE
NEISSERIA GONORRHOEAE, NAA: NEGATIVE
TRICH VAG BY NAA: NEGATIVE

## 2015-11-15 LAB — PAP IG AND HPV HIGH-RISK
HPV, high-risk: POSITIVE — AB
PAP Smear Comment: 0

## 2015-11-18 NOTE — Patient Instructions (Signed)
Please refer to progress note for details of today's visit 

## 2015-11-18 NOTE — Progress Notes (Addendum)
Patient ID: Ashley [name], female   DOB: 1965-09-16, 50 y.o.   MRN: [id number] RN received verbal order from Dr Linus Salmons on 10/29/2015 approving a home visit for further evaluation of the patient's needs.  RN meet with the patient in her home, 10/29/2015. Together we went over her upcoming appointments.  Patient retrieved her list of appt.  During today's visit we reviewed all of her medications and her system for taking her medications. Patient stated she has been taking her medications each day and is doing well with not using drugs along with abstaining from sex. Patient states her current drug of choice is marijuana and she is happy using that alone. She understands why the pharmacy wound not fill her marinol. RN encouraged the patient to try and decrease all drug use since using can decrease her ability to reason and make good choices. Focus of today's visit was to establish a trusting relationship with the patient with a plan on assisting the patient with medication adherence.    Frequency / Duration of [facility] visits: Effective 10/29/2015: 32mo3, 3PRN's for complications with disease process or RN identifies barriers that will interfere with medication adherence/compliance  [facility] will assess for learning needs related to diagnosis and treatment regimen, provide education as needed, fill pill box with each visit, fill syringes if liquid medication is required, communicate with care team including physician and case manager.  Individualized Plan Of Care, Certification Period 10/29/2015- 01/26/2016 a. Type of service(s) and care to be delivered: RN, [place] Nurse b. Frequency and duration of service:Effective 10/29/2015: 56mo3, 3PRN's for complications with disease process or RN identifies barriers that will interfere with medication adherence/compliance c. Activity restrictions: None Noted d. Safety Measures:Standard precautions/Infection Control e. Service Objectives and  Goals:Service Objectives are to assist the pt with HIV medication regimen adherence and staying in care with the [facility] by identifying barriers to care. RN will address the barriers that are identified by the patient. Patient stated goals is to begin to take her medications and no longer do dope.. RN will assist the patient by offering medication management which includes weekly to biweekly pillbox fills of her oral medications and resources for Substance Abuse Conseling/rehabilitation.  RN will coordinate care with the patient's pharmacy to assist with medication adherence. Focus of care will be medication adherence and compliance f. Equipment required: No additional equipment needs noted g. Functional Limitations: No functional limitations noted or expressed h. Rehabilitation potential: Guarded i. Diet and Nutritional Needs:Regular diet j. Medications and treatments: Medications have been reviewed and reconciled. Medications list is a part of this, EPIC, Electronic Charting System k. Specific therapies if needed: None l. Pertinent diagnoses:Hx of medication noncompliance, HIV disease, Depression m. Expected outcome: Guarded

## 2015-11-25 ENCOUNTER — Telehealth: Payer: Self-pay | Admitting: *Deleted

## 2015-11-25 ENCOUNTER — Other Ambulatory Visit: Payer: Self-pay | Admitting: Certified Nurse Midwife

## 2015-11-25 DIAGNOSIS — B3731 Acute candidiasis of vulva and vagina: Secondary | ICD-10-CM

## 2015-11-25 DIAGNOSIS — B373 Candidiasis of vulva and vagina: Secondary | ICD-10-CM

## 2015-11-25 MED ORDER — TERCONAZOLE 0.8 % VA CREA: 1.0000 | TOPICAL_CREAM | Freq: Every day | VAGINAL | 0 refills | Status: DC

## 2015-11-25 NOTE — Telephone Encounter (Signed)
Terazol 3 as directed called to [employer] [phone] per Kandis Cocking CNM Patient is aware.

## 2015-11-29 ENCOUNTER — Telehealth: Payer: Self-pay | Admitting: *Deleted

## 2015-11-29 NOTE — Telephone Encounter (Addendum)
RN informed the patient that  I will be unavailable at least the next 2 weeks. In the event that she needs my assistance she has plenty of resources available to her. RN advised the patient that [facility] will continue to provide support to her and can still be reached at [phone]. In the event of a urgent matter in which medication adherence cannot be maintained electronically, Home Care Providers are more than willing to assist. They can be reached at [phone]  Ms. Cherilyn stated all is well (she continues to take her medications) but she is trying to find someone that can help her pay her light bill. She has already contacted [employer] and is currently waiting on a callback from local churches. RN advised Ms. Lurz that if I hear of anyone offering assistance I will be sure to get in contact with her  Purpose of this communication is to relay that that the set frequency for home visits this week has been changed due to a missed visit. Communication has been made with the patient to ensure needs have been met and to offer a home visit. At this time a return call has not been received before the business week is out. The [facility] Nurse plans to continue contact with the patient to offer any services or attempt to address any needs the patient expresses that is reasonable. Dr. Linus Salmons has been contacted and made aware of this change in the plan of care and how the patient's needs have been met

## 2015-12-01 ENCOUNTER — Telehealth: Payer: Self-pay | Admitting: *Deleted

## 2015-12-01 ENCOUNTER — Other Ambulatory Visit: Payer: Self-pay | Admitting: Certified Nurse Midwife

## 2015-12-01 DIAGNOSIS — B373 Candidiasis of vulva and vagina: Secondary | ICD-10-CM

## 2015-12-01 DIAGNOSIS — B3731 Acute candidiasis of vulva and vagina: Secondary | ICD-10-CM

## 2015-12-01 MED ORDER — BUTOCONAZOLE NITRATE (1 DOSE) 2 % VA CREA: 1.0000 "application " | TOPICAL_CREAM | Freq: Once | VAGINAL | 0 refills | Status: AC

## 2015-12-01 NOTE — Telephone Encounter (Signed)
Please let her know that gynazole generic was sent instead.  Thank you.  R.Tatsuya Okray CNM

## 2015-12-01 NOTE — Telephone Encounter (Signed)
Terazol 3 is not available at this time and they would like to know if we can send an alternative for that. Patient is aware. Ashley [name] [phone]

## 2015-12-09 ENCOUNTER — Other Ambulatory Visit: Payer: Self-pay | Admitting: Certified Nurse Midwife

## 2015-12-13 ENCOUNTER — Telehealth: Payer: Self-pay | Admitting: Family Medicine

## 2015-12-13 NOTE — Telephone Encounter (Signed)
Ashley [name] could you please check this patient out she states she dropped off paperwork 3 wks ago and called back and asked and we told her we had got it she needs to be seen by Dr. Meda Coffee

## 2015-12-15 ENCOUNTER — Encounter: Payer: Self-pay | Admitting: *Deleted

## 2015-12-15 ENCOUNTER — Telehealth: Payer: Self-pay | Admitting: *Deleted

## 2015-12-15 NOTE — Telephone Encounter (Signed)
Patient called stating that she needs a letter from [place] stating that she has HIV and Hep C and needs her lights to be reconnected. Asked the patient if she is on Oxygen, insulin that needs refrigeration or other medical device that needs electricity? She advised no but she needs her lights because it is cold outside. Advised that patient we can provide a letter stating that she is treated her and has HIV and Hep C and that is all. She advised she will be by to pick it up. Tried to advise her to come tomorrow but she disconnected the call.

## 2015-12-16 ENCOUNTER — Ambulatory Visit: Payer: Self-pay | Admitting: Family Medicine

## 2015-12-17 ENCOUNTER — Encounter: Payer: Self-pay | Admitting: *Deleted

## 2015-12-17 ENCOUNTER — Encounter: Payer: Self-pay | Admitting: Family Medicine

## 2015-12-17 ENCOUNTER — Telehealth: Payer: Self-pay | Admitting: Family Medicine

## 2015-12-17 ENCOUNTER — Ambulatory Visit (INDEPENDENT_AMBULATORY_CARE_PROVIDER_SITE_OTHER): Payer: Medicaid Other | Admitting: Family Medicine

## 2015-12-17 VITALS — BP 115/74 | HR 59 | Temp 98.1°F | Ht <= 58 in | Wt 124.0 lb

## 2015-12-17 DIAGNOSIS — Z23 Encounter for immunization: Secondary | ICD-10-CM

## 2015-12-17 DIAGNOSIS — Z79899 Other long term (current) drug therapy: Secondary | ICD-10-CM | POA: Diagnosis not present

## 2015-12-17 DIAGNOSIS — N3000 Acute cystitis without hematuria: Secondary | ICD-10-CM | POA: Diagnosis not present

## 2015-12-17 DIAGNOSIS — E119 Type 2 diabetes mellitus without complications: Secondary | ICD-10-CM | POA: Diagnosis not present

## 2015-12-17 DIAGNOSIS — I10 Essential (primary) hypertension: Secondary | ICD-10-CM | POA: Diagnosis not present

## 2015-12-17 DIAGNOSIS — B3731 Acute candidiasis of vulva and vagina: Secondary | ICD-10-CM

## 2015-12-17 DIAGNOSIS — J439 Emphysema, unspecified: Secondary | ICD-10-CM | POA: Diagnosis not present

## 2015-12-17 DIAGNOSIS — F333 Major depressive disorder, recurrent, severe with psychotic symptoms: Secondary | ICD-10-CM | POA: Diagnosis not present

## 2015-12-17 DIAGNOSIS — B373 Candidiasis of vulva and vagina: Secondary | ICD-10-CM

## 2015-12-17 LAB — MICROSCOPIC EXAMINATION
Epithelial Cells (non renal): >10 /hpf — AB (ref 0–10)
WBC, UA: >30 /hpf — AB (ref 0–?)

## 2015-12-17 LAB — URINALYSIS, COMPLETE
Bilirubin, UA: NEGATIVE
GLUCOSE, UA: NEGATIVE
KETONES UA: NEGATIVE
NITRITE UA: NEGATIVE
Protein, UA: NEGATIVE
SPEC GRAV UA: 1.015 (ref 1.005–1.030)
UUROB: 0.2 mg/dL (ref 0.2–1.0)
pH, UA: 7 (ref 5.0–7.5)

## 2015-12-17 MED ORDER — ALBUTEROL SULFATE HFA 108 (90 BASE) MCG/ACT IN AERS: 2.0000 | INHALATION_SPRAY | Freq: Four times a day (QID) | RESPIRATORY_TRACT | 0 refills | Status: DC | PRN

## 2015-12-17 MED ORDER — FLUCONAZOLE 150 MG PO TABS: 150.0000 mg | ORAL_TABLET | Freq: Once | ORAL | 0 refills | Status: AC

## 2015-12-17 MED ORDER — SULFAMETHOXAZOLE-TRIMETHOPRIM 800-160 MG PO TABS: 1.0000 | ORAL_TABLET | Freq: Two times a day (BID) | ORAL | 0 refills | Status: DC

## 2015-12-17 NOTE — Telephone Encounter (Signed)
Pt states that she talked to you about writing this letter to the [place] during visit this morning so that her electricity can be turned back on since the weather is turning colder.

## 2015-12-17 NOTE — Telephone Encounter (Signed)
ESR he forgot about that one because it was occupied by some any other issues that she had. I guess we can write a letter to the effect of patient has chronic health conditions and could benefit from having her electricity back on especially with winter coming.

## 2015-12-17 NOTE — Progress Notes (Signed)
BP 115/74   Pulse (!) 59   Temp 98.1 F (36.7 C) (Oral)   Ht 4' 7.5" (1.41 m)   Wt 124 lb (56.2 kg)   BMI 28.30 kg/m    Subjective:    Patient ID: Ashley [name], female    DOB: 03-17-1965, 50 y.o.   MRN: [id number]  HPI: Ashley [name] is a 50 y.o. female presenting on 12/17/2015 for Establish Care (was seeing Dr. Criss Rosales in the past)   HPI Hypertension Patient is coming in today to establish care with our office as a new patient. She is to see Dr. Criss Rosales and is coming here as a new patient. Her blood pressure today is 115/74. She is currently on and has been on clonidine and lisinopril and amlodipine. Patient denies headaches, blurred vision, chest pains, shortness of breath, or weakness. Denies any side effects from medication and is content with current medication.   Type 2 diabetes Patient says she's been diagnosed with type 2 diabetes that she is currently diet-controlled diabetes. She has not seen an ophthalmologist yet this year but denies any issues with her vision. She denies any issues with her feet. She is on an ACE inhibitor. She is not currently on a statin.  Major depressive disorder Patient carries a diagnoses of major depressive disorder and she sees psychiatry, [facility] up in [place]. She says they are managing all of her psychiatric medications and do well for her.  COPD Patient is a current smoker and has a chronic history of smoking for at least 15 years. She has been diagnosed with COPD and has a rescue inhaler but no maintenance inhalers and only has to use that about once or twice a month and denies any major wheezing or coughing spells at night more than every few months.  Vaginal irritation and dysuria Patient says she was diagnosed with yeast vaginitis from her gynecologist less than a week ago and she is still been having some irritation. They prescribed a medication for that she cannot afford because it was not covered well by her insurance and she would  like to see if we could prescribe something else for that. She also gave a sample urine because she has been having some dysuria and urinary frequency. She denies any hematuria.  Relevant past medical, surgical, family and social history reviewed and updated as indicated. Interim medical history since our last visit reviewed. Allergies and medications reviewed and updated.  Review of Systems  Constitutional: Negative for chills and fever.  HENT: Negative for congestion, ear discharge and ear pain.   Eyes: Negative for redness and visual disturbance.  Respiratory: Negative for chest tightness and shortness of breath.   Cardiovascular: Negative for chest pain and leg swelling.  Gastrointestinal: Negative for abdominal pain, constipation, diarrhea, nausea and vomiting.  Genitourinary: Positive for dysuria, frequency and vaginal pain. Negative for difficulty urinating, flank pain, menstrual problem, pelvic pain, vaginal bleeding and vaginal discharge.  Musculoskeletal: Negative for back pain and gait problem.  Skin: Negative for rash.  Neurological: Negative for dizziness, light-headedness and headaches.  Psychiatric/Behavioral: Positive for dysphoric mood and sleep disturbance. Negative for agitation, behavioral problems, self-injury and suicidal ideas. The patient is nervous/anxious.   All other systems reviewed and are negative.   Per HPI unless specifically indicated above  Social History   Social History  . Marital status: Single    Spouse name: N/A  . Number of children: N/A  . Years of education: N/A   Occupational History  .  Not on file.   Social History Main Topics  . Smoking status: Current Every Day Smoker    Packs/day: 1.00    Years: 35.00    Types: Cigarettes  . Smokeless tobacco: Never Used     Comment: 1 pack every 2-3 days  . Alcohol use No  . Drug use: No  . Sexual activity: Yes    Birth control/ protection: Condom     Comment: given condoms   Other Topics  Concern  . Not on file   Social History Narrative  . No narrative on file    Past Surgical History:  Procedure Laterality Date  . CESAREAN SECTION    . CHOLECYSTECTOMY    . HERNIA REPAIR    . LIVER BIOPSY      Family History  Problem Relation Age of Onset  . Diabetes Mother   . Hypertension Mother   . Stroke Mother     died 10-18-2013      Objective:    BP 115/74   Pulse (!) 59   Temp 98.1 F (36.7 C) (Oral)   Ht 4' 7.5" (1.41 m)   Wt 124 lb (56.2 kg)   BMI 28.30 kg/m   Wt Readings from Last 3 Encounters:  12/17/15 124 lb (56.2 kg)  11/11/15 119 lb (54 kg)  11/10/15 120 lb (54.4 kg)    Physical Exam  Constitutional: She is oriented to person, place, and time. She appears well-developed and well-nourished. No distress.  Eyes: Conjunctivae are normal.  Cardiovascular: Normal rate, regular rhythm, normal heart sounds and intact distal pulses.   No murmur heard. Pulmonary/Chest: Effort normal and breath sounds normal. No respiratory distress. She has no wheezes.  Abdominal: Soft. Bowel sounds are normal. She exhibits no distension. There is no tenderness. There is no rebound.  Genitourinary:  Genitourinary Comments: Patient declined exam today  Musculoskeletal: Normal range of motion. She exhibits no edema or tenderness.  Neurological: She is alert and oriented to person, place, and time. Coordination normal.  Skin: Skin is warm and dry. No rash noted. She is not diaphoretic.  Psychiatric: Her behavior is normal. Judgment normal. Her mood appears anxious. She exhibits a depressed mood. She expresses no suicidal ideation. She expresses no suicidal plans.  Nursing note and vitals reviewed.  Urinalysis: Greater than 30 diagnoses, 3-10 rbc's, greater than 10 epithelial cells, 0-10 renal epithelial cells, moderate bacteria    Assessment & Plan:   Problem List Items Addressed This Visit      Cardiovascular and Mediastinum   Hypertension - Primary   Relevant Orders     Urinalysis, Complete (Completed)     Respiratory   Pulmonary emphysema (HCC)   Relevant Medications   fluticasone (FLONASE) 50 MCG/ACT nasal spray   albuterol (PROVENTIL HFA;VENTOLIN HFA) 108 (90 Base) MCG/ACT inhaler   Other Relevant Orders   Urinalysis, Complete (Completed)     Endocrine   Type 2 diabetes mellitus (HCC)   Relevant Orders   Urinalysis, Complete (Completed)     Other   MDD (major depressive disorder), recurrent, severe, with psychosis ([place])   Relevant Orders   Urinalysis, Complete (Completed)   Encounter for long-term (current) use of medications   Relevant Orders   Urinalysis, Complete (Completed)    Other Visit Diagnoses    Yeast vaginitis       Relevant Medications   sulfamethoxazole-trimethoprim (BACTRIM DS,SEPTRA DS) 800-160 MG tablet   Other Relevant Orders   Urinalysis, Complete (Completed)   Acute cystitis  without hematuria       Relevant Medications   sulfamethoxazole-trimethoprim (BACTRIM DS,SEPTRA DS) 800-160 MG tablet   Other Relevant Orders   Urinalysis, Complete (Completed)   Encounter for immunization       Relevant Orders   Flu Vaccine QUAD 36+ mos IM (Completed)       Follow up plan: Return in about 4 weeks (around 01/14/2016), or if symptoms worsen or fail to improve, for diabetes and hypertension check.  Caryl Pina, MD [facility] 12/17/2015, 10:19 AM

## 2015-12-21 ENCOUNTER — Encounter: Payer: Self-pay | Admitting: Family Medicine

## 2015-12-21 ENCOUNTER — Ambulatory Visit (INDEPENDENT_AMBULATORY_CARE_PROVIDER_SITE_OTHER): Payer: Medicaid Other

## 2015-12-21 ENCOUNTER — Ambulatory Visit (INDEPENDENT_AMBULATORY_CARE_PROVIDER_SITE_OTHER): Payer: Medicaid Other | Admitting: Family Medicine

## 2015-12-21 VITALS — BP 110/69 | HR 82 | Temp 97.5°F | Ht <= 58 in | Wt 124.6 lb

## 2015-12-21 DIAGNOSIS — M25552 Pain in left hip: Secondary | ICD-10-CM

## 2015-12-21 DIAGNOSIS — M25551 Pain in right hip: Secondary | ICD-10-CM

## 2015-12-21 NOTE — Progress Notes (Signed)
   HPI  Patient presents today with bilateral hip pain.  Patient explains that she has bilateral hip pain and "locking up" whenever she wears flat shoes. She wear supportive shoes the pain goes away and does not occur. The pain only happens after several hours of standing or walking in flat shoes.  She also complains of right ankle pain chronically. It only bothers her occasionally when she is walking barefooted, again if she wears supportive shoes this does not occur.  It sounds like she has had some's understanding is pertaining to pain medications in the past. I made it clear that it was up to her PCP to determine if he would prescribe pain medications today.  PMH: Smoking status noted ROS: Per HPI  Objective: BP 110/69   Pulse 82   Temp 97.5 F (36.4 C) (Oral)   Ht 4' 7.5" (1.41 m)   Wt 124 lb 9.6 oz (56.5 kg)   BMI 28.44 kg/m  Gen: NAD, alert, cooperative with exam HEENT: NCAT CV: RRR, good S1/S2, no murmur Resp: CTABL, no wheezes, non-labored Ext: No edema, warm Neuro: Alert and oriented, No gross deficits  MSK Pain with right-sided Faber test, small amount of pain in the left side Also pain with palpation of bilateral anterior superior iliac spine Full range of motion of the hip  Assessment and plan:  # Hip pain Bilateral hip pain, only exacerbated by wearing nonsupportive shoes. Asian has very good p.o. supportive shoes in clinic today, recommended wearing them a primary issues. No medications needed Plain films to evaluate for possible hip arthritis Return to clinic as needed   She has recently established care with our clinic and complains of several "fallouts" with previous clinics. These seem to be centered around narcotic pain medications, I am concerned for secondary gain.   Orders Placed This Encounter  Procedures  . DG HIPS BILAT W OR W/O PELVIS 2V    Standing Status:   Future    Standing Expiration Date:   12/20/2016    Order Specific Question:    Reason for Exam (SYMPTOM  OR DIAGNOSIS REQUIRED)    Answer:   BL hip pain    Order Specific Question:   Is the patient pregnant?    Answer:   No    Order Specific Question:   Preferred imaging location?    Answer:   External     Laroy Apple, MD [facility] 12/21/2015, 3:43 PM

## 2015-12-21 NOTE — Patient Instructions (Addendum)
Great to meet you!  Be sure to wear supportive shoes whenever you are up walking around

## 2015-12-21 NOTE — Telephone Encounter (Signed)
Tye [place], do you have any info on this?

## 2015-12-21 NOTE — Telephone Encounter (Signed)
Letter updated, printed, and given to pt.   Laroy Apple, MD [facility] 12/21/2015, 4:05 PM

## 2015-12-22 ENCOUNTER — Telehealth: Payer: Self-pay | Admitting: *Deleted

## 2015-12-22 NOTE — Telephone Encounter (Signed)
Patient ID: Ashley [name], female   DOB: 02/26/1966, 50 y.o.   MRN: [id number] RN received verbal order from Dr Linus Salmons on 10/29/2015 approving a home visit for further evaluation of the patient's needs.  RN meet with the patient in her home, 10/29/2015. Together we went over her upcoming appointments.  Patient retrieved her list of appt.  During today's visit we reviewed all of her medications and her system for taking her medications. Patient stated she has been taking her medications each day and is doing well with not using drugs along with abstaining from sex. Patient states her current drug of choice is marijuana and she is happy using that alone. She understands why the pharmacy wound not fill her marinol. RN encouraged the patient to try and decrease all drug use since using can decrease her ability to reason and make good choices. Focus of today's visit was to establish a trusting relationship with the patient with a plan on assisting the patient with medication adherence.    Frequency / Duration of [facility] visits: Effective 10/29/2015: 56mo3, 3PRN's for complications with disease process or RN identifies barriers that will interfere with medication adherence/compliance  [facility] will assess for learning needs related to diagnosis and treatment regimen, provide education as needed, fill pill box with each visit, fill syringes if liquid medication is required, communicate with care team including physician and case manager.  Individualized Plan Of Care, Certification Period 10/29/2015- 01/26/2016 a. Type of service(s) and care to be delivered: RN, [place] Nurse b. Frequency and duration of service:Effective 10/29/2015: 66mo3, 3PRN's for complications with disease process or RN identifies barriers that will interfere with medication adherence/compliance c. Activity restrictions: None Noted d. Safety Measures:Standard precautions/Infection Control e. Service Objectives and  Goals:Service Objectives are to assist the pt with HIV medication regimen adherence and staying in care with the Infect[facility] Disease [facility] by identifying barriers to care. RN will address the barriers that are identified by the patient. Patient stated goals is to begin to take her medications and no longer do dope.. RN will assist the patient by offering medication management which includes weekly to biweekly pillbox fills of her oral medications and resources for Substance Abuse [place]/rehabilitation.  RN will coordinate care with the patient's pharmacy to assist with medication adherence. Focus of care will be medication adherence and compliance f. Equipment required: No additional equipment needs noted g. Functional Limitations: No functional limitations noted or expressed h. Rehabilitation potential: Guarded i. Diet and Nutritional Needs:Regular diet j. Medications and treatments: Medications have been reviewed and reconciled. Medications list is a part of this, EPIC, Electronic Charting System k. Specific therapies if needed: None l. Pertinent diagnoses:Hx of medication noncompliance, HIV disease, Depression m. Expected outcome: Guarded

## 2015-12-22 NOTE — Telephone Encounter (Signed)
I agree with the plan of care as outlined.  

## 2016-01-04 ENCOUNTER — Other Ambulatory Visit: Payer: Self-pay | Admitting: Internal Medicine

## 2016-01-04 DIAGNOSIS — B20 Human immunodeficiency virus [HIV] disease: Secondary | ICD-10-CM

## 2016-01-13 ENCOUNTER — Telehealth: Payer: Self-pay | Admitting: Family Medicine

## 2016-01-13 NOTE — Telephone Encounter (Signed)
NA 11/16-jhb Spoke with LAT and this pt's lawyer will have to request the records.

## 2016-01-17 ENCOUNTER — Telehealth: Payer: Self-pay

## 2016-01-17 NOTE — Telephone Encounter (Signed)
Per Dr. Warrick Parisian, patient was informed that we received paperwork from her attorney, Hoyle Sauer, requesting a medical assessment of the patient.  Patient was informed that Dr. Warrick Parisian did not do these and she should see a disability physician for this assessment.  Patient voices understanding.

## 2016-01-19 ENCOUNTER — Ambulatory Visit: Payer: Medicaid Other | Admitting: Family Medicine

## 2016-01-21 ENCOUNTER — Other Ambulatory Visit: Payer: Self-pay | Admitting: *Deleted

## 2016-01-21 DIAGNOSIS — J439 Emphysema, unspecified: Secondary | ICD-10-CM

## 2016-01-21 MED ORDER — ALBUTEROL SULFATE HFA 108 (90 BASE) MCG/ACT IN AERS: 2.0000 | INHALATION_SPRAY | Freq: Four times a day (QID) | RESPIRATORY_TRACT | 1 refills | Status: DC | PRN

## 2016-01-21 NOTE — Telephone Encounter (Signed)
Phone call has been taken care of in a different encounter.  This encounter will now be closed

## 2016-01-24 ENCOUNTER — Ambulatory Visit (INDEPENDENT_AMBULATORY_CARE_PROVIDER_SITE_OTHER): Payer: Medicaid Other | Admitting: Family Medicine

## 2016-01-24 ENCOUNTER — Encounter: Payer: Self-pay | Admitting: Family Medicine

## 2016-01-24 VITALS — BP 117/80 | HR 75 | Temp 99.0°F | Ht <= 58 in | Wt 122.4 lb

## 2016-01-24 DIAGNOSIS — K219 Gastro-esophageal reflux disease without esophagitis: Secondary | ICD-10-CM | POA: Diagnosis not present

## 2016-01-24 DIAGNOSIS — I10 Essential (primary) hypertension: Secondary | ICD-10-CM | POA: Diagnosis not present

## 2016-01-24 DIAGNOSIS — E119 Type 2 diabetes mellitus without complications: Secondary | ICD-10-CM

## 2016-01-24 LAB — BAYER DCA HB A1C WAIVED: HB A1C: 5.5 % (ref <7.0)

## 2016-01-24 MED ORDER — RANITIDINE HCL 150 MG PO TABS: 150.0000 mg | ORAL_TABLET | Freq: Two times a day (BID) | ORAL | 5 refills | Status: DC

## 2016-01-24 NOTE — Progress Notes (Signed)
BP 117/80   Pulse 75   Temp 99 F (37.2 C) (Oral)   Ht 4' 7.5" (1.41 m)   Wt 122 lb 6 oz (55.5 kg)   BMI 27.93 kg/m    Subjective:    Patient ID: Ashley [name], female    DOB: [birth year], 50 y.o.   MRN: [id number]  HPI: Ashley [name] is a 50 y.o. female presenting on 01/24/2016 for Hypertension (followup); Diabetes; and Nausea (x 4 weeks)   HPI Hypertension Patient is coming in today for hypertension recheck. Her blood pressure is 117/80. She is currently on amlodipine and clonidine and lisinopril. Patient denies headaches, blurred vision, chest pains, shortness of breath, or weakness. Denies any side effects from medication and is content with current medication.   Type 2 diabetes Patient is coming in for a checkup on her type 2 diabetes. She is due for hemoglobin A1c here today in our office that she has not had one with [place] yet. She was a new patient for weeks ago and we're waiting to do labs today since she come in fasting. For her diabetes she has been diet controlled up to this point. We will check to see how that is going. She is on an ACE inhibitor. She has not seen an ophthalmologist yet this year but plans to. She denies any hypoglycemic episodes. She has not been checking regularly.  Nausea and bloating Patient comes in complaining of nausea and belching and burping and bloating and the occasional vomiting that has been intermittent over the past few weeks. She denies any fevers or chills or diarrhea or constipation. She has been having regular bowel movements every day. She denies any burning going up into her chest. She has had her gallbladder removed previously but thinks some of the initial symptoms may have been similar to this. She denies any sick contacts that she knows of. She denies any blood in her stool  Relevant past medical, surgical, family and social history reviewed and updated as indicated. Interim medical history since our last visit reviewed. Allergies and  medications reviewed and updated.  Review of Systems  Constitutional: Negative for chills and fever.  HENT: Negative for congestion, ear discharge and ear pain.   Eyes: Negative for redness and visual disturbance.  Respiratory: Negative for chest tightness and shortness of breath.   Cardiovascular: Negative for chest pain and leg swelling.  Gastrointestinal: Positive for nausea and vomiting. Negative for abdominal pain, blood in stool, constipation and diarrhea.  Genitourinary: Negative for difficulty urinating and dysuria.  Musculoskeletal: Negative for back pain and gait problem.  Skin: Negative for rash.  Neurological: Negative for dizziness, weakness, light-headedness, numbness and headaches.  Psychiatric/Behavioral: Negative for agitation and behavioral problems.  All other systems reviewed and are negative.   Per HPI unless specifically indicated above     Objective:    BP 117/80   Pulse 75   Temp 99 F (37.2 C) (Oral)   Ht 4' 7.5" (1.41 m)   Wt 122 lb 6 oz (55.5 kg)   BMI 27.93 kg/m   Wt Readings from Last 3 Encounters:  01/24/16 122 lb 6 oz (55.5 kg)  12/21/15 124 lb 9.6 oz (56.5 kg)  12/17/15 124 lb (56.2 kg)    Physical Exam  Constitutional: She is oriented to person, place, and time. She appears well-developed and well-nourished. No distress.  Eyes: Conjunctivae are normal. Right eye exhibits no discharge. Left eye exhibits no discharge.  Cardiovascular: Normal rate, regular rhythm,  normal heart sounds and intact distal pulses.   No murmur heard. Pulmonary/Chest: Effort normal and breath sounds normal. No respiratory distress. She has no wheezes. She has no rales.  Abdominal: Soft. Bowel sounds are normal. She exhibits distension. There is tenderness (Epigastric tenderness, no CVA tenderness, no rebound or guarding).  Musculoskeletal: Normal range of motion. She exhibits no edema or tenderness.  Neurological: She is alert and oriented to person, place, and time.  Coordination normal.  Skin: Skin is warm and dry. No rash noted. She is not diaphoretic.  Psychiatric: She has a normal mood and affect. Her behavior is normal.  Nursing note and vitals reviewed.     Assessment & Plan:   Problem List Items Addressed This Visit      Cardiovascular and Mediastinum   Hypertension   Relevant Orders   CMP14+EGFR   Lipid panel     Endocrine   Type 2 diabetes mellitus ([place]) - Primary   Relevant Orders   Bayer DCA Hb A1c Waived   CMP14+EGFR   Lipid panel    Other Visit Diagnoses    Gastroesophageal reflux disease without esophagitis       Patient has belching and bloating and burping and nausea, we will try Zantac   Relevant Medications   ranitidine (ZANTAC) 150 MG tablet       Follow up plan: Return in about 3 months (around 04/25/2016), or if symptoms worsen or fail to improve, for Recheck blood pressure and diabetes.  Counseling provided for all of the vaccine components Orders Placed This Encounter  Procedures  . Bayer DCA Hb A1c Waived  . CMP14+EGFR  . Lipid panel    Caryl Pina, MD [facility] 01/24/2016, 1:23 PM

## 2016-01-25 ENCOUNTER — Telehealth: Payer: Self-pay | Admitting: Family Medicine

## 2016-01-25 LAB — CMP14+EGFR
ALK PHOS: 61 IU/L (ref 39–117)
ALT: 61 IU/L — AB (ref 0–32)
AST: 54 IU/L — AB (ref 0–40)
Albumin/Globulin Ratio: 1.5 (ref 1.2–2.2)
Albumin: 4.4 g/dL (ref 3.5–5.5)
BUN/Creatinine Ratio: 8 — ABNORMAL LOW (ref 9–23)
BUN: 8 mg/dL (ref 6–24)
Bilirubin Total: 0.3 mg/dL (ref 0.0–1.2)
CALCIUM: 9.4 mg/dL (ref 8.7–10.2)
CO2: 21 mmol/L (ref 18–29)
CREATININE: 1.05 mg/dL — AB (ref 0.57–1.00)
Chloride: 105 mmol/L (ref 96–106)
GFR calc Af Amer: 71 mL/min/{1.73_m2} (ref >59)
GFR calc non Af Amer: 61 mL/min/{1.73_m2} (ref >59)
GLOBULIN, TOTAL: 2.9 g/dL (ref 1.5–4.5)
Glucose: 95 mg/dL (ref 65–99)
POTASSIUM: 4.7 mmol/L (ref 3.5–5.2)
SODIUM: 142 mmol/L (ref 134–144)
Total Protein: 7.3 g/dL (ref 6.0–8.5)

## 2016-01-25 LAB — LIPID PANEL
CHOL/HDL RATIO: 2.2 ratio (ref 0.0–4.4)
CHOLESTEROL TOTAL: 151 mg/dL (ref 100–199)
HDL: 68 mg/dL (ref >39)
LDL Calculated: 75 mg/dL (ref 0–99)
Triglycerides: 38 mg/dL (ref 0–149)
VLDL CHOLESTEROL CAL: 8 mg/dL (ref 5–40)

## 2016-01-25 NOTE — Telephone Encounter (Signed)
Advised pt that rx for zantac was sent into the pharmacy and pt was taking excedrin for leg pain instead of ibuprofen.

## 2016-01-26 ENCOUNTER — Telehealth: Payer: Self-pay | Admitting: Family Medicine

## 2016-01-26 MED ORDER — CIPROFLOXACIN HCL 0.3 % OP SOLN: OPHTHALMIC | 0 refills | Status: DC

## 2016-01-26 NOTE — Telephone Encounter (Signed)
Pt notified of RX Verbalizes understanding 

## 2016-01-26 NOTE — Telephone Encounter (Signed)
Spoke with pt regarding eye drops Per pt she has redness and matting Per pt this was discussed at appt Please advise

## 2016-02-01 ENCOUNTER — Other Ambulatory Visit: Payer: Self-pay | Admitting: Family Medicine

## 2016-02-03 ENCOUNTER — Ambulatory Visit: Payer: Medicaid Other | Admitting: Family Medicine

## 2016-02-04 ENCOUNTER — Encounter: Payer: Self-pay | Admitting: Family Medicine

## 2016-02-08 ENCOUNTER — Other Ambulatory Visit: Payer: Self-pay | Admitting: Family Medicine

## 2016-02-10 ENCOUNTER — Ambulatory Visit: Payer: Medicaid Other | Admitting: Family Medicine

## 2016-02-15 ENCOUNTER — Ambulatory Visit: Payer: Medicaid Other | Admitting: Pediatrics

## 2016-02-16 ENCOUNTER — Ambulatory Visit: Payer: Medicaid Other | Admitting: Pediatrics

## 2016-02-17 ENCOUNTER — Encounter: Payer: Self-pay | Admitting: Family Medicine

## 2016-02-18 ENCOUNTER — Ambulatory Visit: Payer: Medicaid Other | Admitting: Pediatrics

## 2016-02-22 ENCOUNTER — Ambulatory Visit: Payer: Medicaid Other | Admitting: Pediatrics

## 2016-02-23 ENCOUNTER — Ambulatory Visit: Payer: Medicaid Other | Admitting: Family Medicine

## 2016-02-23 ENCOUNTER — Encounter: Payer: Self-pay | Admitting: Family Medicine

## 2016-03-05 ENCOUNTER — Emergency Department (HOSPITAL_COMMUNITY): Payer: Medicaid Other

## 2016-03-05 ENCOUNTER — Encounter (HOSPITAL_COMMUNITY): Payer: Self-pay | Admitting: Emergency Medicine

## 2016-03-05 ENCOUNTER — Emergency Department (HOSPITAL_COMMUNITY)
Admission: EM | Admit: 2016-03-05 | Discharge: 2016-03-05 | Disposition: A | Discharge status: Home / Self Care | Payer: Medicaid Other | Attending: Emergency Medicine | Admitting: Emergency Medicine

## 2016-03-05 DIAGNOSIS — E119 Type 2 diabetes mellitus without complications: Secondary | ICD-10-CM | POA: Diagnosis not present

## 2016-03-05 DIAGNOSIS — R05 Cough: Secondary | ICD-10-CM | POA: Diagnosis present

## 2016-03-05 DIAGNOSIS — F17210 Nicotine dependence, cigarettes, uncomplicated: Secondary | ICD-10-CM | POA: Insufficient documentation

## 2016-03-05 DIAGNOSIS — I10 Essential (primary) hypertension: Secondary | ICD-10-CM | POA: Diagnosis not present

## 2016-03-05 DIAGNOSIS — Z79899 Other long term (current) drug therapy: Secondary | ICD-10-CM | POA: Diagnosis not present

## 2016-03-05 DIAGNOSIS — J40 Bronchitis, not specified as acute or chronic: Secondary | ICD-10-CM | POA: Diagnosis not present

## 2016-03-05 DIAGNOSIS — Z21 Asymptomatic human immunodeficiency virus [HIV] infection status: Secondary | ICD-10-CM | POA: Insufficient documentation

## 2016-03-05 MED ORDER — PREDNISONE 50 MG PO TABS
60.0000 mg | ORAL_TABLET | Freq: Once | ORAL | Status: AC
  Administered 2016-03-05: 23:00:00 60 mg via ORAL
  Filled 2016-03-05: qty 1

## 2016-03-05 MED ORDER — SULFAMETHOXAZOLE-TRIMETHOPRIM 800-160 MG PO TABS: 1.0000 | ORAL_TABLET | Freq: Two times a day (BID) | ORAL | 0 refills | Status: AC

## 2016-03-05 MED ORDER — ALBUTEROL SULFATE (2.5 MG/3ML) 0.083% IN NEBU
2.5000 mg | INHALATION_SOLUTION | Freq: Once | RESPIRATORY_TRACT | Status: AC
  Administered 2016-03-05: 2.5 mg via RESPIRATORY_TRACT
  Filled 2016-03-05: qty 3

## 2016-03-05 MED ORDER — IPRATROPIUM-ALBUTEROL 0.5-2.5 (3) MG/3ML IN SOLN
3.0000 mL | Freq: Once | RESPIRATORY_TRACT | Status: AC
  Administered 2016-03-05: 3 mL via RESPIRATORY_TRACT
  Filled 2016-03-05: qty 3

## 2016-03-05 NOTE — ED Triage Notes (Signed)
Cough, congestion x Friday.  C/o rt rib soreness when coughing.

## 2016-03-05 NOTE — Discharge Instructions (Signed)
Follow up with your md if not improving. °

## 2016-03-05 NOTE — ED Provider Notes (Signed)
[facility] DEPT Provider Note   CSN: [id number] Arrival date & time: 03/05/16  2039  By signing my name below, I, Reola Mosher, attest that this documentation has been prepared under the direction and in the presence of Milton Ferguson, MD. Electronically Signed: Reola Mosher, ED Scribe. 03/05/16. 8:53 PM.  History   Chief Complaint Chief Complaint  Patient presents with  . Cough   The history is provided by the patient and medical records. No language interpreter was used.  Cough  This is a new problem. The current episode started more than 2 days ago. The problem has been gradually worsening. The cough is productive of sputum. There has been no fever. Pertinent negatives include no chest pain and no headaches. Treatments tried: Albuterol inhlaer. The treatment provided no relief. She is a smoker. Her past medical history is significant for emphysema.    HPI Comments: Ashley [name] is a 51 y.o. female with a PMHx of pulmonary emphysema, TB, HIV (as of 11/11/15 last CD4: 24), and DM, who presents to the Emergency Department complaining of gradually worsening, persistent, productive cough w/ yellow sputum beginning 3-4 days ago. She reports associated nausea and vomiting secondary to her cough. Pt has been using her at home albuterol inhaler without relief of her symptoms. No other associated symptoms or complaints at this time.   Past Medical History:  Diagnosis Date  . Ankle fracture   . Anxiety   . Asthma   . Collagen vascular disease ([place])   . Depression   . Diabetes mellitus   . Hepatitis C   . Hernia   . HIV (human immunodeficiency virus infection) ([place])   . HIV (human immunodeficiency virus infection) ([place])   . Hypertension   . Pinched nerve   . Scoliosis   . TB (tuberculosis)    Patient Active Problem List   Diagnosis Date Noted  . Pulmonary emphysema ([employer]) 12/17/2015  . Liver fibrosis ([place]) 11/11/2015  . Screening examination for venereal disease  06/24/2015  . Encounter for long-term (current) use of medications 06/24/2015  . Substance abuse 06/24/2015  . Atypical squamous cells of undetermined significance (ASCUS) on Papanicolaou smear of cervix on 03/26/15 04/02/2015  . MDD (major depressive disorder), recurrent, severe, with psychosis ([place]) 04/30/2014  . Elevated LFTs 01/13/2012  . Tuberculosis 12/19/2011  . Cigarette smoker 12/19/2011  . Hereditary and idiopathic peripheral neuropathy 06/16/2009  . Hypertension 03/19/2009  . ABDOMINAL WALL HERNIA 03/19/2009  . BACK PAIN, CHRONIC 03/19/2009  . Human immunodeficiency virus (HIV) disease ([place]) 03/18/2009  . Hepatitis C virus infection without hepatic coma 03/18/2009  . Type 2 diabetes mellitus ([place]) 03/18/2009  . SCHIZOPHRENIA 03/18/2009  . BIPOLAR AFFECTIVE DISORDER, DEPRESSED, SEVERE 03/18/2009   Past Surgical History:  Procedure Laterality Date  . CESAREAN SECTION    . CHOLECYSTECTOMY    . HERNIA REPAIR    . LIVER BIOPSY     OB History    Gravida Para Term Preterm AB Living   2 2       2    SAB TAB Ectopic Multiple Live Births                 Home Medications    Prior to Admission medications   Medication Sig Start Date End Date Taking? Authorizing Provider  acetaminophen (TYLENOL) 325 MG tablet Take 650 mg by mouth every 6 (six) hours as needed for mild pain. Reported on 06/16/2015    Historical Provider, MD  albuterol (PROVENTIL HFA;VENTOLIN HFA)  108 (90 Base) MCG/ACT inhaler Inhale 2 puffs into the lungs every 6 (six) hours as needed for wheezing or shortness of breath. 01/21/16   Fransisca Kaufmann Dettinger, MD  amLODipine (NORVASC) 10 MG tablet Take 1 tablet (10 mg total) by mouth at bedtime. 05/04/14   Kerrie Buffalo, NP  Black Cohosh 40 MG CAPS Take by mouth.    Historical Provider, MD  ciprofloxacin (CILOXAN) 0.3 % ophthalmic solution ADMINISTER 1 DROP EVERY 2 HOURS WHILE AWAKE FOR 2 DAYS THEN 1 DROP EVERY 4 HOURS WHILE AWAKE FOR THE NEXT 5 DAYS. 02/09/16   Fransisca Kaufmann  Dettinger, MD  cloNIDine (CATAPRES) 0.2 MG tablet Take 0.2 mg by mouth 3 (three) times daily. 02/17/15   Historical Provider, MD  DESCOVY 200-25 MG tablet Take 1 tablet by mouth daily. 01/04/16   Thayer Headings, MD  fexofenadine (ALLEGRA) 180 MG tablet Take 180 mg by mouth daily.    Historical Provider, MD  FLUoxetine (PROZAC) 20 MG capsule Take 1 capsule (20 mg total) by mouth daily. 05/04/14   Kerrie Buffalo, NP  fluticasone (FLONASE) 50 MCG/ACT nasal spray Place 2 sprays into both nostrils daily.    Historical Provider, MD  lisinopril (PRINIVIL,ZESTRIL) 40 MG tablet Take 40 mg by mouth daily. 03/19/15   Historical Provider, MD  megestrol (MEGACE) 40 MG tablet Take 1 tablet (40 mg total) by mouth 2 (two) times daily. 11/11/15   Thayer Headings, MD  Multiple Vitamin (MULTIVITAMIN) tablet Take 1 tablet by mouth daily. 10/20/15   Thayer Headings, MD  Multiple Vitamins-Minerals (HAIR SKIN AND NAILS FORMULA PO) Take by mouth.    Historical Provider, MD  ranitidine (ZANTAC) 150 MG tablet Take 1 tablet (150 mg total) by mouth 2 (two) times daily. 01/24/16   Fransisca Kaufmann Dettinger, MD  TIVICAY 50 MG tablet TAKE ONE TABLET BY MOUTH EVERY DAY 01/04/16   Thayer Headings, MD   Family History Family History  Problem Relation Age of Onset  . Diabetes Mother   . Hypertension Mother   . Stroke Mother     died October 09, 2013  Social History Social History  Substance Use Topics  . Smoking status: Current Every Day Smoker    Packs/day: 1.00    Years: 35.00    Types: Cigarettes  . Smokeless tobacco: Never Used     Comment: 1 pack every 2-3 days  . Alcohol use No   Allergies   Aspirin  Review of Systems Review of Systems  Constitutional: Negative for appetite change and fatigue.  HENT: Positive for congestion. Negative for ear discharge and sinus pressure.   Eyes: Negative for discharge.  Respiratory: Positive for cough.   Cardiovascular: Negative for chest pain.  Gastrointestinal: Positive for nausea and  vomiting. Negative for abdominal pain and diarrhea.  Genitourinary: Negative for frequency and hematuria.  Musculoskeletal: Negative for back pain.  Skin: Negative for rash.  Neurological: Negative for seizures and headaches.  Psychiatric/Behavioral: Negative for hallucinations.  All other systems reviewed and are negative.  Physical Exam Updated Vital Signs BP (!) 157/112 (BP Location: Right Arm)   Pulse 90   Resp 26   SpO2 96%   Physical Exam  Constitutional: She is oriented to person, place, and time. She appears well-developed.  HENT:  Head: Normocephalic.  Eyes: Conjunctivae and EOM are normal. No scleral icterus.  Neck: Neck supple. No thyromegaly present.  Cardiovascular: Normal rate and regular rhythm.  Exam reveals no gallop and no friction rub.   No  murmur heard. Pulmonary/Chest: No stridor. She has wheezes. She has no rales. She exhibits no tenderness.  Minimal wheezing bilaterally is noted.   Abdominal: She exhibits no distension. There is no tenderness. There is no rebound.  Musculoskeletal: Normal range of motion. She exhibits no edema.  Lymphadenopathy:    She has no cervical adenopathy.  Neurological: She is oriented to person, place, and time. She exhibits normal muscle tone. Coordination normal.  Skin: No rash noted. No erythema.  Psychiatric: She has a normal mood and affect. Her behavior is normal.  Nursing note and vitals reviewed.  ED Treatments / Results  DIAGNOSTIC STUDIES: Oxygen Saturation is 96% on RA, normal by my interpretation.   COORDINATION OF CARE: 8:51 PM-Discussed next steps with pt. Pt verbalized understanding and is agreeable with the plan.   Labs (all labs ordered are listed, but only abnormal results are displayed) Labs Reviewed - No data to display  EKG  EKG Interpretation None      Radiology No results found.  Procedures Procedures   Medications Ordered in ED Medications - No data to display  Initial Impression /  Assessment and Plan / ED Course  I have reviewed the triage vital signs and the nursing notes.  Pertinent labs & imaging results that were available during my care of the patient were reviewed by me and considered in my medical decision making (see chart for details).  Clinical Course    Bronchitis and bronchospasm.   tx with bactrim an follow up   Final Clinical Impressions(s) / ED Diagnoses   Final diagnoses:  None   New Prescriptions New Prescriptions   No medications on file   The chart was scribed for me under my direct supervision.  I personally performed the history, physical, and medical decision making and all procedures in the evaluation of this patient.Milton Ferguson, MD 03/05/16 2258

## 2016-03-06 ENCOUNTER — Ambulatory Visit: Payer: Medicaid Other | Admitting: Family Medicine

## 2016-03-06 IMAGING — CR DG FOOT COMPLETE 3+V*R*
3 series · 3 of 3 positions shown · non-contrast
Comparison: 06/15/2011.

CLINICAL DATA: Right foot pain. No recent injury. Multiple right
foot fractures in 4999.

EXAM:
RIGHT FOOT COMPLETE - 3+ VIEW

[t foot ap right]
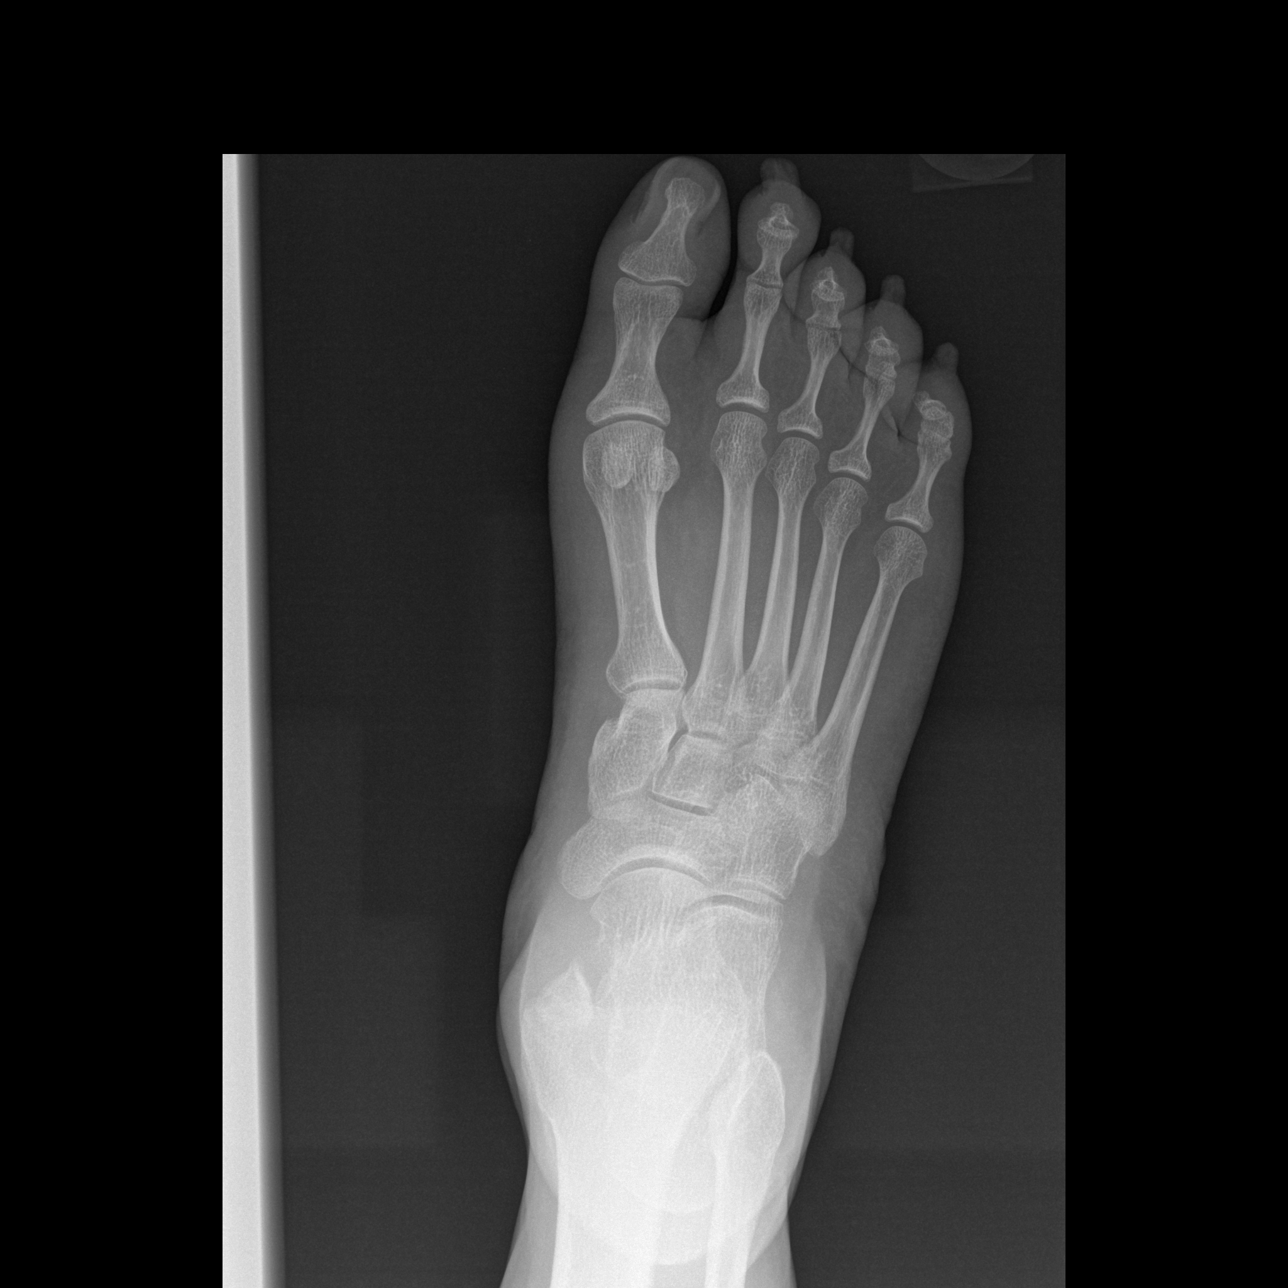

[t foot oblique right]
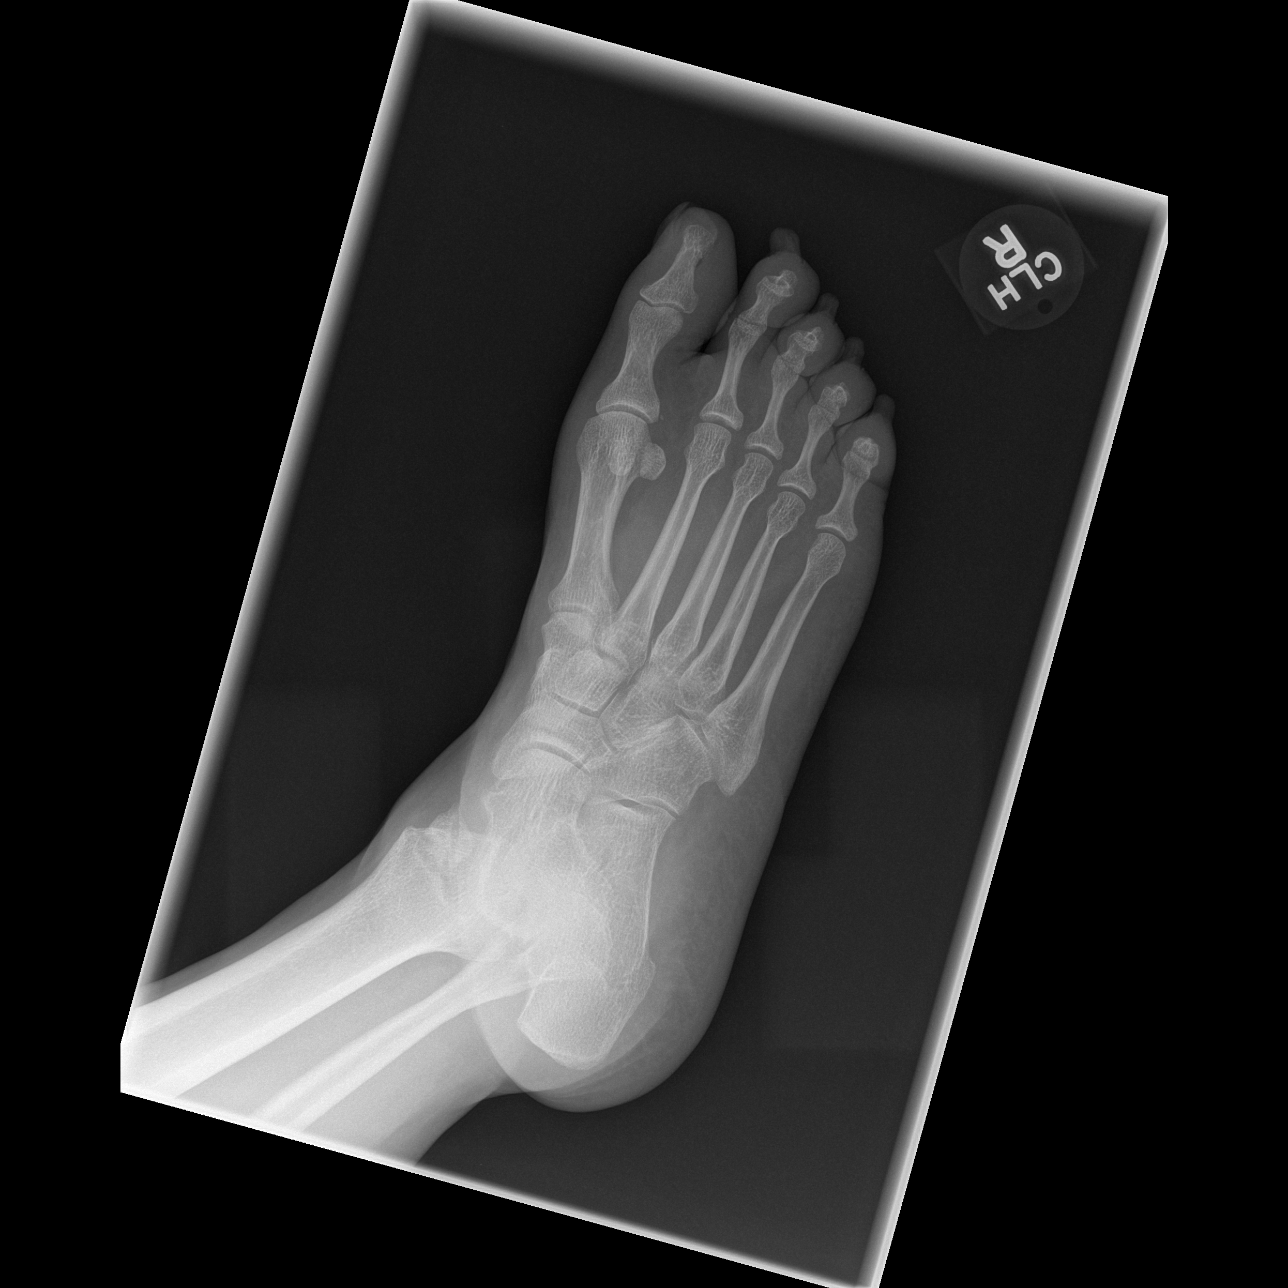

[t foot lat right]
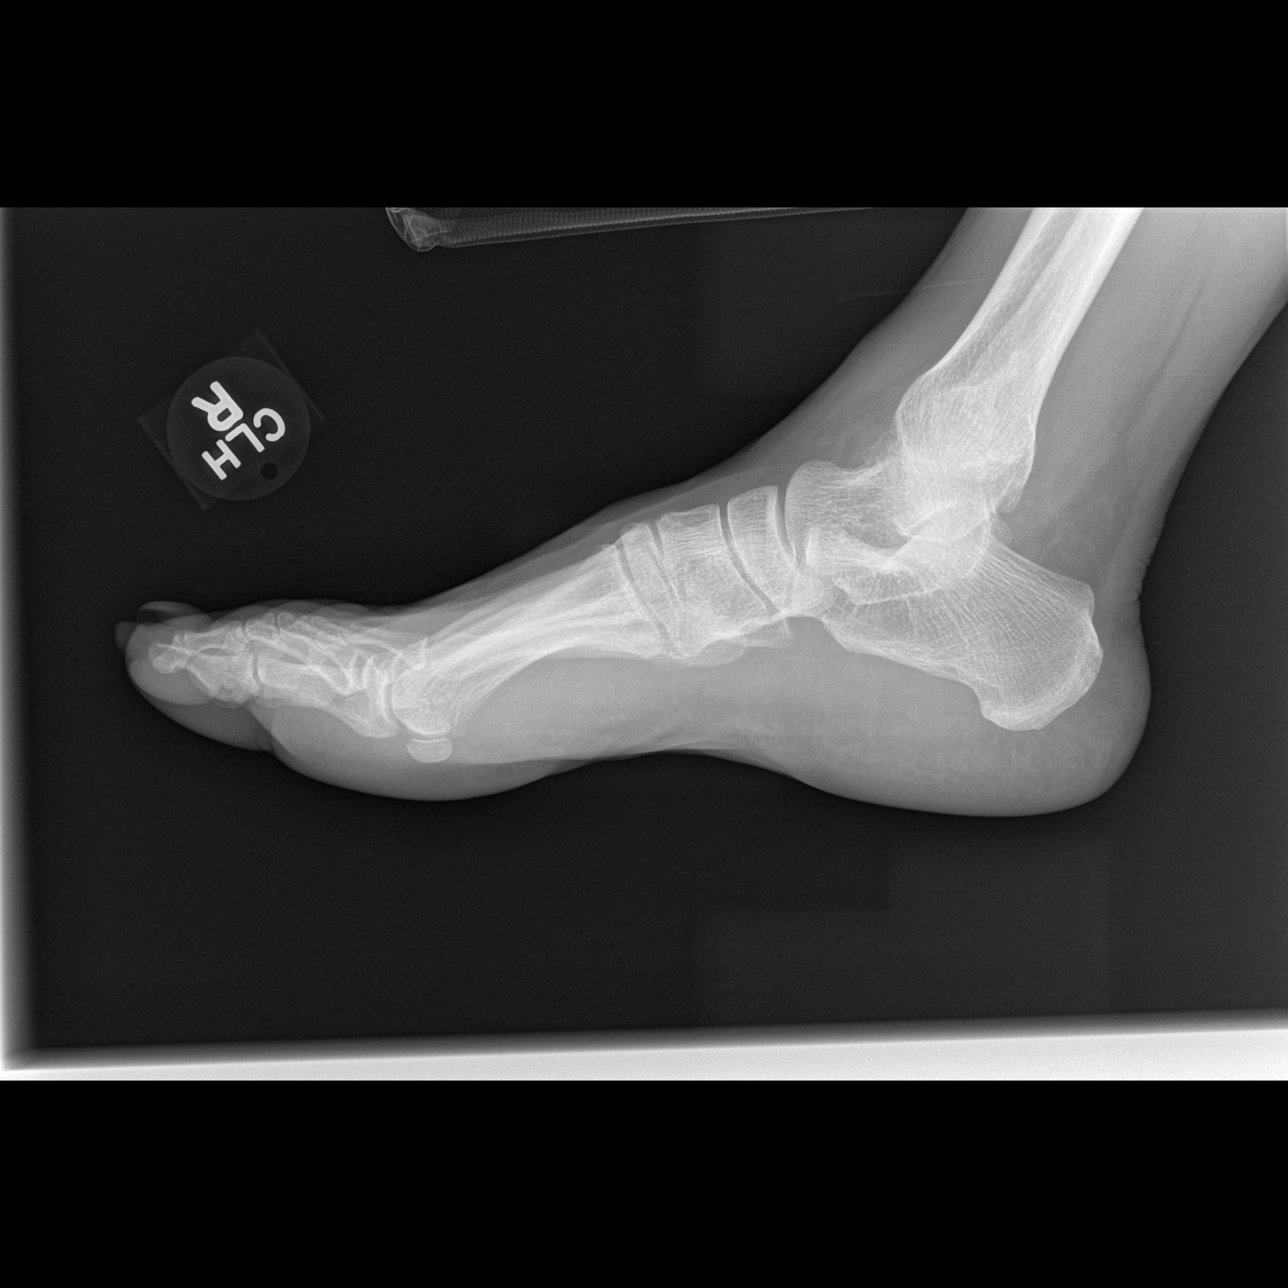

[3 of 3 positions shown; findings below may reference images not displayed]

FINDINGS: There is no evidence of acute fracture or dislocation. Interval
healing of the previously demonstrated lateral malleolus fracture
and possible incomplete healing of the previously demonstrated
medial malleolus fracture. There is no evidence of arthropathy or
other focal bone abnormality. Soft tissues are unremarkable.
IMPRESSION: 1. No acute fracture.
2. Possible nonunion of the previously demonstrated medial malleolus
fracture.

## 2016-03-07 ENCOUNTER — Ambulatory Visit: Payer: Self-pay | Admitting: *Deleted

## 2016-03-07 ENCOUNTER — Ambulatory Visit: Payer: Self-pay | Admitting: Internal Medicine

## 2016-03-07 ENCOUNTER — Other Ambulatory Visit: Payer: Self-pay | Admitting: Family Medicine

## 2016-03-07 DIAGNOSIS — B20 Human immunodeficiency virus [HIV] disease: Secondary | ICD-10-CM

## 2016-03-07 DIAGNOSIS — J439 Emphysema, unspecified: Secondary | ICD-10-CM

## 2016-03-10 ENCOUNTER — Encounter: Payer: Self-pay | Admitting: Pediatrics

## 2016-03-10 ENCOUNTER — Ambulatory Visit (INDEPENDENT_AMBULATORY_CARE_PROVIDER_SITE_OTHER): Payer: Medicaid Other | Admitting: Pediatrics

## 2016-03-10 ENCOUNTER — Other Ambulatory Visit: Payer: Self-pay | Admitting: Family Medicine

## 2016-03-10 ENCOUNTER — Other Ambulatory Visit: Payer: Self-pay | Admitting: *Deleted

## 2016-03-10 VITALS — BP 136/89 | HR 98 | Temp 97.7°F | Resp 18 | Ht <= 58 in | Wt 129.0 lb

## 2016-03-10 DIAGNOSIS — J441 Chronic obstructive pulmonary disease with (acute) exacerbation: Secondary | ICD-10-CM

## 2016-03-10 DIAGNOSIS — W19XXXA Unspecified fall, initial encounter: Secondary | ICD-10-CM

## 2016-03-10 DIAGNOSIS — B20 Human immunodeficiency virus [HIV] disease: Secondary | ICD-10-CM

## 2016-03-10 DIAGNOSIS — J439 Emphysema, unspecified: Secondary | ICD-10-CM

## 2016-03-10 MED ORDER — AMLODIPINE BESYLATE 10 MG PO TABS: 10.0000 mg | ORAL_TABLET | Freq: Every day | ORAL | 0 refills | Status: DC

## 2016-03-10 MED ORDER — PREDNISONE 20 MG PO TABS: ORAL_TABLET | ORAL | 0 refills | Status: DC

## 2016-03-10 MED ORDER — ALBUTEROL SULFATE HFA 108 (90 BASE) MCG/ACT IN AERS: INHALATION_SPRAY | RESPIRATORY_TRACT | 1 refills | Status: DC

## 2016-03-10 MED ORDER — AZITHROMYCIN 250 MG PO TABS
ORAL_TABLET | ORAL | 0 refills | Status: DC
Start: 2016-03-10 — End: 2016-04-12

## 2016-03-10 NOTE — Patient Instructions (Addendum)
Albuterol three times a day  Stop bactrim  Take prednisone and azithromycin as prescribed  Let me know if not improving

## 2016-03-10 NOTE — Progress Notes (Signed)
  Subjective:   Patient ID: Ashley [name], female    DOB: 1965-05-28, 51 y.o.   MRN: [id number] CC: Fall (01/31/16) and Abdominal Pain (lower)  HPI: Ashley [name] is a 51 y.o. female presenting for Fall (01/31/16) and Abdominal Pain (lower)  H/o HIV, on medications most recently does have h/o non-compliance  Fell 2 weeks ago, pulling buggy at [employer], had on [employer] on cement onto her hands and knees Able to walk after the incident  Bruised her knees and hands Moving around well now, still feels slightly sore  Coughing more for the past couple of weeks Was started on bactrim a week ago for bronchitis Smoking daily, h/o COPD Stomach has been sore, she thinks she is using abd muscles more Normal stooling No fevers at home Appetite has been fine   Relevant past medical, surgical, family and social history reviewed. Allergies and medications reviewed and updated. History  Smoking Status  . Current Every Day Smoker  . Packs/day: 1.00  . Years: 35.00  . Types: Cigarettes  Smokeless Tobacco  . Never Used    Comment: 1 pack every 2-3 days   ROS: Per HPI   Objective:    BP 136/89   Pulse 98   Temp 97.7 F (36.5 C) (Oral)   Resp 18   Ht 4' 7.5" (1.41 m)   Wt 129 lb (58.5 kg)   SpO2 99%   BMI 29.44 kg/m   Wt Readings from Last 3 Encounters:  03/10/16 129 lb (58.5 kg)  01/24/16 122 lb 6 oz (55.5 kg)  12/21/15 124 lb 9.6 oz (56.5 kg)  O2 sat 99%   Gen: NAD, alert, cooperative with exam, NCAT EYES: EOMI, no conjunctival injection, or no icterus ENT:  TMs pearly gray b/l, OP without erythema LYMPH: no cervical LAD CV: NRRR, normal S1/S2, no murmur, distal pulses 2+ b/l Resp: exp wheeze present b/l, normal WOB, no crackles Abd: +BS, soft, NTND. no guarding or organomegaly Neuro: Alert and oriented, strength equal b/l UE and LE, coordination grossly normal MSK: full ROM UE, LE, no tenderness over bones, no pain in legs with weight bearing  Assessment & Plan:    [name] was seen today for fall and abdominal pain.  Diagnoses and all orders for this visit:  COPD exacerbation ([place]) H/o HIV O2 sats normal Wheezing on exam, increased cough Treat as below, albuterol TID -     predniSONE (DELTASONE) 20 MG tablet; 2 po at same time daily for 4 days -     azithromycin (ZITHROMAX) 250 MG tablet; Take 2 the first day and then one each day after.  Pulmonary emphysema, unspecified emphysema type ([place]) -     albuterol (PROVENTIL HFA) 108 (90 Base) MCG/ACT inhaler; INHALE 2 PUFFS INTO LUNGS EVERY 6 HOURS AS NEEDED FOR WHEEZING OR SHORTNESS OF BREATH.  Fall, initial encounter Still feels bruised, walking better now No concern for fractures on exam  HIV Pt w/ HIV history, CD4 count 300s last check, most recently has been on her meds Follows with ID, missed most recent appt but going to reschedule   Follow up plan: Return in about 4 weeks (around 04/07/2016) for med follow up, copd. [name], MD [place]

## 2016-03-10 NOTE — Telephone Encounter (Signed)
Refill sent to pharmacy.  Patient aware that she will need to be seen by PCP before any further refills can be sent

## 2016-03-29 ENCOUNTER — Ambulatory Visit: Payer: Medicaid Other | Admitting: Family Medicine

## 2016-03-30 NOTE — Progress Notes (Addendum)
RN meet with the patient in her home, 03/07/2016. Together we went over her upcoming appointments.  Patient retrieved her list of appt.  During today's visit we reviewed all of her medications and her system for taking her medications. Patient stated she has been taking her medications each day and is doing well with not using drugs along with abstaining from sex. Patient states her current drug of choice is marijuana and she is happy using that alone. She understands why the pharmacy wound not fill her marinol. RN encouraged the patient to try and decrease all drug use since using can decrease her ability to reason and make good choices. Focus of today's visit was to establish a trusting relationship and evaluate the need for further visits. RN assisted the patient by getting her medications from the pharmacy as well during today's visit and the plan at this time is to discharge the patient. Ashley [name] has the tools that she needs th adhere to her medications and arrange transportation for appoints that are located in [place].  The intent of this communication is to inform the Health Care Team that this patient will be discharged from [facility]).  Greater than 3 attempts have been made to re-engage the patient  without any success .Moving forward, the [facility] will be willing to reopen the patient to services if and when the patient is ready to discuss medication adherence and HIV disease  management. Effective 03/07/16 patient will be discharged and removed from [facility] active patient listing.

## 2016-03-31 ENCOUNTER — Ambulatory Visit: Payer: Medicaid Other | Admitting: Family Medicine

## 2016-04-03 ENCOUNTER — Ambulatory Visit (INDEPENDENT_AMBULATORY_CARE_PROVIDER_SITE_OTHER): Payer: Medicaid Other | Admitting: Internal Medicine

## 2016-04-03 ENCOUNTER — Encounter: Payer: Self-pay | Admitting: Internal Medicine

## 2016-04-03 VITALS — BP 133/89 | HR 86 | Temp 98.7°F | Wt 130.0 lb

## 2016-04-03 DIAGNOSIS — B182 Chronic viral hepatitis C: Secondary | ICD-10-CM | POA: Diagnosis not present

## 2016-04-03 DIAGNOSIS — F17210 Nicotine dependence, cigarettes, uncomplicated: Secondary | ICD-10-CM

## 2016-04-03 DIAGNOSIS — F1910 Other psychoactive substance abuse, uncomplicated: Secondary | ICD-10-CM

## 2016-04-03 DIAGNOSIS — Z113 Encounter for screening for infections with a predominantly sexual mode of transmission: Secondary | ICD-10-CM

## 2016-04-03 DIAGNOSIS — B20 Human immunodeficiency virus [HIV] disease: Secondary | ICD-10-CM

## 2016-04-04 LAB — RPR

## 2016-04-04 NOTE — Assessment & Plan Note (Signed)
She continues to do well from this standpoint and I congratulated her.

## 2016-04-04 NOTE — Assessment & Plan Note (Signed)
Doing well by her report.  Labs today and rtc 3-4 months unless concerns.

## 2016-04-04 NOTE — Progress Notes (Signed)
CC: Follow up for HIV  Interval history: Currently is on Tivicay and Descovy.  She has a long history of poor compliance, poor control and substance abuse.  She also has hepatitis C and has not yet been treated or qualified for treatment based on medicaid rules.  Since last visit she has been monitored by our home RN to help her with her medications, substance abuse.  She also has a PCP.  Last CD4 of 440 and viral load down to 22 copies.  She continues on her medications.  She continues to be drug free and feels well.  Planning to go back to school.    Prior to Admission medications   Medication Sig Start Date End Date Taking? Authorizing Provider  acetaminophen (TYLENOL) 325 MG tablet Take 650 mg by mouth every 6 (six) hours as needed for mild pain. Reported on 06/16/2015   Yes Historical Provider, MD  amLODipine (NORVASC) 10 MG tablet Take 1 tablet (10 mg total) by mouth at bedtime. 05/04/14  Yes Kerrie Buffalo, NP  barrier cream (NON-SPECIFIED) CREA Apply 1 application topically 2 (two) times daily as needed. 05/04/14  Yes Kerrie Buffalo, NP  cloNIDine (CATAPRES) 0.2 MG tablet Take 0.2 mg by mouth 3 (three) times daily. 02/17/15  Yes Historical Provider, MD  dicyclomine (BENTYL) 20 MG tablet Take 1 tablet (20 mg total) by mouth 2 (two) times daily as needed for spasms. 05/09/14  Yes Julianne Rice, MD  dolutegravir (TIVICAY) 50 MG tablet Take 1 tablet (50 mg total) by mouth daily. 06/24/15  Yes Thayer Headings, MD  emtricitabine-tenofovir AF (DESCOVY) 200-25 MG tablet Take 1 tablet by mouth daily. 06/24/15  Yes Thayer Headings, MD  fexofenadine (ALLEGRA) 180 MG tablet Take 180 mg by mouth daily.   Yes Historical Provider, MD  FLUoxetine (PROZAC) 20 MG capsule Take 1 capsule (20 mg total) by mouth daily. 05/04/14  Yes Kerrie Buffalo, NP  lisinopril (PRINIVIL,ZESTRIL) 40 MG tablet Take 40 mg by mouth daily. 03/19/15  Yes Historical Provider, MD  loratadine (CLARITIN) 10 MG tablet Take 1 tablet (10 mg total) by  mouth daily. 05/04/14  Yes Kerrie Buffalo, NP  Multiple Vitamin (MULTIVITAMIN) tablet Take 1 tablet by mouth daily. 02/02/15  Yes Thayer Headings, MD  nystatin (MYCOSTATIN/NYSTOP) 100000 UNIT/GM POWD Apply to groin BID 05/04/14  Yes Kerrie Buffalo, NP  polyethylene glycol (MIRALAX / GLYCOLAX) packet Take 17 g by mouth daily. 05/09/14  Yes Julianne Rice, MD  QUEtiapine (SEROQUEL) 25 MG tablet Take 1 tablet (25 mg total) by mouth 2 (two) times daily. 05/04/14  Yes Kerrie Buffalo, NP  traMADol (ULTRAM) 50 MG tablet Take 50 mg by mouth every 6 (six) hours as needed for moderate pain. Reported on 06/16/2015   Yes Historical Provider, MD  traZODone (DESYREL) 100 MG tablet Take 1 tablet (100 mg total) by mouth at bedtime. 05/04/14  Yes Kerrie Buffalo, NP  vitamin E 400 UNIT capsule Take 400 Units by mouth daily. 04/13/15  Yes Historical Provider, MD    Review of Systems Constitutional: negative for fevers and chills Gastrointestinal: negative for diarrhea Musculoskeletal: negative for myalgias and arthralgias All other systems reviewed and are negative   Physical Exam: CONSTITUTIONAL:in no apparent distress Vitals:   04/03/16 1509  BP: 133/89  Pulse: 86  Temp: 98.7 F (37.1 C)   Eyes: anicteric HENT: no thrush, no cervical lymphadenopathy Respiratory: Normal respiratory effort; CTA B GI: soft, nt  Lab Results  Component Value Date   HIV1RNAQUANT 22 (H)  11/11/2015   HIV1RNAQUANT 45 (H) 06/24/2015   HIV1RNAQUANT 29,469 (H) 12/24/2014   No components found for: HIV1GENOTYPRPLUS No components found for: THELPERCELL  SHx: drug free still except for marijuana, support from church members

## 2016-04-04 NOTE — Assessment & Plan Note (Signed)
I will consider treatment her next visit if her HIV remains controlled.

## 2016-04-04 NOTE — Assessment & Plan Note (Signed)
She is precontemplative at this time

## 2016-04-05 LAB — HIV-1 RNA QUANT-NO REFLEX-BLD
HIV 1 RNA Quant: <20 copies/mL — AB
HIV-1 RNA Quant, Log: <1.3 Log copies/mL — AB

## 2016-04-05 LAB — T-HELPER CELL (CD4) - (RCID CLINIC ONLY)
CD4 % Helper T Cell: 29 % — ABNORMAL LOW (ref 33–55)
CD4 T Cell Abs: 640 /uL (ref 400–2700)

## 2016-04-07 ENCOUNTER — Ambulatory Visit: Payer: Medicaid Other | Admitting: Pediatrics

## 2016-04-07 ENCOUNTER — Other Ambulatory Visit: Payer: Self-pay | Admitting: Internal Medicine

## 2016-04-12 ENCOUNTER — Encounter: Payer: Self-pay | Admitting: Pediatrics

## 2016-04-12 ENCOUNTER — Ambulatory Visit (INDEPENDENT_AMBULATORY_CARE_PROVIDER_SITE_OTHER): Payer: Medicaid Other | Admitting: Pediatrics

## 2016-04-12 VITALS — BP 130/85 | HR 69 | Temp 98.1°F | Ht <= 58 in | Wt 132.8 lb

## 2016-04-12 DIAGNOSIS — Z720 Tobacco use: Secondary | ICD-10-CM | POA: Diagnosis not present

## 2016-04-12 DIAGNOSIS — E119 Type 2 diabetes mellitus without complications: Secondary | ICD-10-CM | POA: Diagnosis not present

## 2016-04-12 DIAGNOSIS — I10 Essential (primary) hypertension: Secondary | ICD-10-CM

## 2016-04-12 DIAGNOSIS — J42 Unspecified chronic bronchitis: Secondary | ICD-10-CM | POA: Diagnosis not present

## 2016-04-12 DIAGNOSIS — H6501 Acute serous otitis media, right ear: Secondary | ICD-10-CM

## 2016-04-12 MED ORDER — AMOXICILLIN 500 MG PO CAPS: 500.0000 mg | ORAL_CAPSULE | Freq: Three times a day (TID) | ORAL | 0 refills | Status: DC

## 2016-04-12 NOTE — Progress Notes (Signed)
  Subjective:   Patient ID: Ashley [name], female    DOB: 06/16/1965, 51 y.o.   MRN: [id number] CC: Follow-up (4 week) breathing HPI: Ashley [name] is a 50 y.o. female presenting for Follow-up (4 week)  Breathing has been much better Taking allergy pill now daily because more congested past few days Smoking cigarettes daily, now 3-4 cigs a day Last time needed steroids for breathing prior to last month was 2009 she thinks Rarely using albuterol now, once a week Feels SOB at times, more than what she should be  R ear has been bothering her some  Had pap smear, mammogram done last year at gynecologist  H/o DM, now diet controlled  Follows with ID for HIV, taking meds daily per pt Not yet started on HCV treatment  Good support from church family  Relevant past medical, surgical, family and social history reviewed. Allergies and medications reviewed and updated. History  Smoking Status  . Current Every Day Smoker  . Packs/day: 1.00  . Years: 35.00  . Types: Cigarettes  Smokeless Tobacco  . Never Used    Comment: 1 pack every 2-3 days   ROS: Per HPI   Objective:    BP 130/85   Pulse 69   Temp 98.1 F (36.7 C) (Oral)   Ht 4' 7.5" (1.41 m)   Wt 132 lb 12.8 oz (60.2 kg)   BMI 30.31 kg/m   Wt Readings from Last 3 Encounters:  04/12/16 132 lb 12.8 oz (60.2 kg)  04/03/16 130 lb (59 kg)  03/10/16 129 lb (58.5 kg)    Gen: NAD, alert, cooperative with exam, NCAT ENT: R TM injected, bulging, L TM normal, OP without erythema LYMPH: no cervical LAD CV: NRRR, normal S1/S2, no murmur, distal pulses 2+ b/l Resp: CTABL, no wheezes, normal WOB Abd: +BS, soft, mildly tender with deep palpation, mildly distended. no guarding or organomegaly Ext: No edema, warm Neuro: Alert and oriented  Assessment & Plan:  Ashley [name] was seen today for follow-up.  Diagnoses and all orders for this visit:  Diabetes mellitus without complication ([place]) [id number] 5.5, diet controlled Needs eye exam -      Ambulatory referral to [place] / creatinine urine ratio  Essential hypertension Takes meds regularly, adequate control today  Tobacco use Decreasing cig/day, trying to quit, encourgaed cessation, discussed strategies  Wheezing Needed steroids last month for COPD exacerbation Normal spirometry today First time with exacerbation in years If needing albuterol more often let me know -     PR BREATHING CAPACITY TEST  Right acute serous otitis media, recurrence not specified -     amoxicillin (AMOXIL) 500 MG capsule; Take 1 capsule (500 mg total) by mouth 3 (three) times daily.   Follow up plan: Return in about 2 months (around 06/10/2016) for complete physical. [name], MD [place]

## 2016-04-13 ENCOUNTER — Telehealth: Payer: Self-pay | Admitting: Pediatrics

## 2016-04-13 ENCOUNTER — Other Ambulatory Visit: Payer: Self-pay | Admitting: Pediatrics

## 2016-04-13 DIAGNOSIS — B379 Candidiasis, unspecified: Secondary | ICD-10-CM

## 2016-04-13 LAB — MICROALBUMIN / CREATININE URINE RATIO
Creatinine, Urine: 186 mg/dL
MICROALBUM., U, RANDOM: 31.7 ug/mL
Microalb/Creat Ratio: 17 mg/g creat (ref 0.0–30.0)

## 2016-04-13 NOTE — Telephone Encounter (Signed)
Pt aware that when papers are ready we will contact her for pick-up

## 2016-04-14 MED ORDER — FLUCONAZOLE 150 MG PO TABS: 150.0000 mg | ORAL_TABLET | Freq: Once | ORAL | 0 refills | Status: AC

## 2016-04-14 NOTE — Telephone Encounter (Signed)
Patient aware that medication has been sent to the pharmacy and to try using OTC eye drops for itchy eyes.  Per Dr. [name]

## 2016-04-14 NOTE — Telephone Encounter (Signed)
Sent in fluconazole for yeast infection. What is wrong with her eyes? She can try OTC natural tears eye drops for itchy eyes. If not improving needs to be seen.

## 2016-04-19 ENCOUNTER — Telehealth: Payer: Self-pay | Admitting: Pediatrics

## 2016-04-19 MED ORDER — CLONIDINE HCL 0.2 MG PO TABS: 0.2000 mg | ORAL_TABLET | Freq: Three times a day (TID) | ORAL | 1 refills | Status: DC

## 2016-04-19 NOTE — Telephone Encounter (Signed)
done

## 2016-04-19 NOTE — Telephone Encounter (Signed)
What is the name of the medication? clonidine  Have you contacted your pharmacy to request a refill? yes  Which pharmacy would you [facility]   Patient notified that their request is being sent to the clinical staff for review and that they should receive a call once it is complete. If they do not receive a call within 24 hours they can check with their pharmacy or our office.

## 2016-04-24 ENCOUNTER — Telehealth: Payer: Self-pay | Admitting: Pediatrics

## 2016-04-24 MED ORDER — CLONIDINE HCL 0.2 MG PO TABS: 0.2000 mg | ORAL_TABLET | Freq: Three times a day (TID) | ORAL | 0 refills | Status: DC

## 2016-04-24 NOTE — Telephone Encounter (Signed)
Patient had samples of edarvyclor medication from previous provider. Exp date is May 2017. Instructed patient not to take those. 1 week worth of Clonidine sent to [facility] till her mail order arrives this week

## 2016-04-26 ENCOUNTER — Ambulatory Visit: Payer: Medicaid Other | Admitting: Family Medicine

## 2016-05-02 ENCOUNTER — Other Ambulatory Visit: Payer: Self-pay | Admitting: Family Medicine

## 2016-05-02 ENCOUNTER — Other Ambulatory Visit: Payer: Self-pay | Admitting: Internal Medicine

## 2016-05-02 DIAGNOSIS — J439 Emphysema, unspecified: Secondary | ICD-10-CM

## 2016-05-04 ENCOUNTER — Telehealth: Payer: Self-pay | Admitting: Family Medicine

## 2016-05-05 NOTE — Telephone Encounter (Signed)
Informed that we do not do disability assessments

## 2016-05-22 ENCOUNTER — Telehealth: Payer: Self-pay | Admitting: *Deleted

## 2016-05-22 NOTE — Telephone Encounter (Signed)
Error

## 2016-06-02 LAB — HM DIABETES EYE EXAM

## 2016-06-05 ENCOUNTER — Other Ambulatory Visit: Payer: Self-pay | Admitting: Pediatrics

## 2016-06-19 ENCOUNTER — Ambulatory Visit (INDEPENDENT_AMBULATORY_CARE_PROVIDER_SITE_OTHER): Payer: Medicaid Other | Admitting: Pediatrics

## 2016-06-19 ENCOUNTER — Encounter: Payer: Self-pay | Admitting: Pediatrics

## 2016-06-19 VITALS — BP 101/68 | HR 61 | Temp 97.0°F | Ht <= 58 in | Wt 126.8 lb

## 2016-06-19 DIAGNOSIS — J309 Allergic rhinitis, unspecified: Secondary | ICD-10-CM

## 2016-06-19 DIAGNOSIS — I10 Essential (primary) hypertension: Secondary | ICD-10-CM | POA: Diagnosis not present

## 2016-06-19 DIAGNOSIS — Z1211 Encounter for screening for malignant neoplasm of colon: Secondary | ICD-10-CM

## 2016-06-19 DIAGNOSIS — M545 Low back pain, unspecified: Secondary | ICD-10-CM

## 2016-06-19 DIAGNOSIS — G8929 Other chronic pain: Secondary | ICD-10-CM

## 2016-06-19 DIAGNOSIS — K5900 Constipation, unspecified: Secondary | ICD-10-CM

## 2016-06-19 DIAGNOSIS — E119 Type 2 diabetes mellitus without complications: Secondary | ICD-10-CM | POA: Diagnosis not present

## 2016-06-19 LAB — BAYER DCA HB A1C WAIVED: HB A1C (BAYER DCA - WAIVED): 5.6 % (ref <7.0)

## 2016-06-19 MED ORDER — CETIRIZINE HCL 10 MG PO TABS: 10.0000 mg | ORAL_TABLET | Freq: Every day | ORAL | 11 refills | Status: DC

## 2016-06-19 MED ORDER — METHOCARBAMOL 500 MG PO TABS: 500.0000 mg | ORAL_TABLET | Freq: Three times a day (TID) | ORAL | 1 refills | Status: DC | PRN

## 2016-06-19 MED ORDER — SENNA 8.6 MG PO TABS: 1.0000 | ORAL_TABLET | Freq: Every day | ORAL | 0 refills | Status: DC | PRN

## 2016-06-19 NOTE — Progress Notes (Signed)
  Subjective:   Patient ID: Ashley [name], female    DOB: 10/04/1965, 51 y.o.   MRN: [id number] CC: Follow-up (2 month ); Back Pain; Neck Pain; Leg Pain; and foot pain  HPI: Ashley [name] is a 51 y.o. female presenting for Follow-up (2 month ); Back Pain; Neck Pain; Leg Pain; and foot pain  Colon ca screening: never had colonoscopy One episode of blood in stools on toilet Had hemorrhoids off and on since pregnancy years ago  Constipation: used to go every other day, now going every 3-4 days  Allergies: itchy eyes, runny nose, sneezing, ears itching No fevers, doesn't feel unwell Taking OTC from [employer]  Having low back pain that bothers her off and on Hurts both sides of her lower back Says she was told she has scoliosis, back pain comes and goes  HTN: says bps usually 120s-130s Denies lightheadedness, CP, HA  Relevant past medical, surgical, family and social history reviewed. Allergies and medications reviewed and updated. History  Smoking Status  . Current Every Day Smoker  . Packs/day: 1.00  . Years: 35.00  . Types: Cigarettes  Smokeless Tobacco  . Never Used    Comment: 1 pack every 2-3 days   ROS: Per HPI   Objective:    BP 101/68   Pulse 61   Temp 97 F (36.1 C) (Oral)   Ht 4' 7.5" (1.41 m)   Wt 126 lb 12.8 oz (57.5 kg)   BMI 28.94 kg/m   Wt Readings from Last 3 Encounters:  06/19/16 126 lb 12.8 oz (57.5 kg)  04/12/16 132 lb 12.8 oz (60.2 kg)  04/03/16 130 lb (59 kg)   Gen: NAD, alert, cooperative with exam, NCAT EYES: EOMI, no conjunctival injection, or no icterus CV: NRRR, normal S1/S2, no murmur Resp: CTABL, no wheezes, normal WOB Abd: +BS, soft, NTND. Ext: No edema, warm Neuro: Alert and oriented MSK: no point tenderness over spine, mildly ttp b/l paraspinal mucles  Assessment & Plan:  Ashley [name] was seen today for follow-up, back pain, neck pain, leg pain and foot pain.  Diagnoses and all orders for this visit:  Type 2 diabetes mellitus  without complication, without long-term current use of insulin ([place]) Well controlled, a1c 5.6 -     Bayer DCA Hb A1c Waived -     CMP14+EGFR  Essential hypertension Well controlled, denies lightheadedness Cont current meds -     CMP14+EGFR  Screen for colon cancer due -     Ambulatory referral to Gastroenterology  Allergic rhinitis, unspecified seasonality, unspecified trigger Ongoing sx, start below -     cetirizine (ZYRTEC) 10 MG tablet; Take 1 tablet (10 mg total) by mouth daily.  Constipation, unspecified constipation type Not taking anything now, start OTCs, if not improving let m eknow -     senna (SENOKOT) 8.6 MG TABS tablet; Take 1 tablet (8.6 mg total) by mouth daily as needed for mild constipation.  Chronic bilateral low back pain without sciatica Below prn, may cause sleepiness, if not improving let me know -     methocarbamol (ROBAXIN) 500 MG tablet; Take 1 tablet (500 mg total) by mouth every 8 (eight) hours as needed for muscle spasms.   Follow up plan: Return in about 3 months (around 09/18/2016). [name], MD [place]

## 2016-06-20 ENCOUNTER — Other Ambulatory Visit: Payer: Self-pay | Admitting: Internal Medicine

## 2016-06-20 LAB — CMP14+EGFR
ALBUMIN: 4.6 g/dL (ref 3.5–5.5)
ALT: 31 IU/L (ref 0–32)
AST: 37 IU/L (ref 0–40)
Albumin/Globulin Ratio: 1.9 (ref 1.2–2.2)
Alkaline Phosphatase: 68 IU/L (ref 39–117)
BUN / CREAT RATIO: 8 — AB (ref 9–23)
BUN: 8 mg/dL (ref 6–24)
Bilirubin Total: 0.4 mg/dL (ref 0.0–1.2)
CO2: 22 mmol/L (ref 18–29)
CREATININE: 1.06 mg/dL — AB (ref 0.57–1.00)
Calcium: 9.3 mg/dL (ref 8.7–10.2)
Chloride: 104 mmol/L (ref 96–106)
GFR calc Af Amer: 70 mL/min/{1.73_m2} (ref >59)
GFR calc non Af Amer: 61 mL/min/{1.73_m2} (ref >59)
GLUCOSE: 95 mg/dL (ref 65–99)
Globulin, Total: 2.4 g/dL (ref 1.5–4.5)
Potassium: 3.5 mmol/L (ref 3.5–5.2)
Sodium: 143 mmol/L (ref 134–144)
TOTAL PROTEIN: 7 g/dL (ref 6.0–8.5)

## 2016-06-29 ENCOUNTER — Other Ambulatory Visit: Payer: Self-pay | Admitting: Family Medicine

## 2016-06-29 ENCOUNTER — Other Ambulatory Visit: Payer: Self-pay | Admitting: Internal Medicine

## 2016-06-29 DIAGNOSIS — B20 Human immunodeficiency virus [HIV] disease: Secondary | ICD-10-CM

## 2016-06-29 DIAGNOSIS — J439 Emphysema, unspecified: Secondary | ICD-10-CM

## 2016-07-27 ENCOUNTER — Telehealth: Payer: Self-pay | Admitting: Licensed Clinical Social Worker

## 2016-07-27 ENCOUNTER — Encounter: Payer: Self-pay | Admitting: Internal Medicine

## 2016-07-27 ENCOUNTER — Ambulatory Visit (INDEPENDENT_AMBULATORY_CARE_PROVIDER_SITE_OTHER): Payer: Medicaid Other | Admitting: Internal Medicine

## 2016-07-27 VITALS — BP 105/72 | Temp 98.3°F | Ht <= 58 in | Wt 121.0 lb

## 2016-07-27 DIAGNOSIS — K7400 Hepatic fibrosis, unspecified: Secondary | ICD-10-CM

## 2016-07-27 DIAGNOSIS — F17210 Nicotine dependence, cigarettes, uncomplicated: Secondary | ICD-10-CM | POA: Diagnosis not present

## 2016-07-27 DIAGNOSIS — B20 Human immunodeficiency virus [HIV] disease: Secondary | ICD-10-CM

## 2016-07-27 DIAGNOSIS — B182 Chronic viral hepatitis C: Secondary | ICD-10-CM

## 2016-07-27 DIAGNOSIS — F1910 Other psychoactive substance abuse, uncomplicated: Secondary | ICD-10-CM | POA: Diagnosis not present

## 2016-07-27 MED ORDER — GLECAPREVIR-PIBRENTASVIR 100-40 MG PO TABS: 3.0000 | ORAL_TABLET | Freq: Every day | ORAL | 1 refills | Status: DC

## 2016-07-27 NOTE — Assessment & Plan Note (Signed)
Encouraged cessation.
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
## 2016-07-27 NOTE — Assessment & Plan Note (Signed)
Will check her labs today, known genotype 1a.  Start her on [place] when approved and get her in with pharmacy.

## 2016-07-27 NOTE — Assessment & Plan Note (Signed)
Fibrosis score noted.  No indication for [place] screening.

## 2016-07-27 NOTE — Telephone Encounter (Signed)
Patient was scheduled for appointment on Wed June 6th at 11:45 am in error.  Appointment was cancelled due to schedule conflict.  Call was made to patient to reschedule.  Home number was called and someone was speaking in another language so this may not be the right number.  Mobile number was able to leave message.

## 2016-07-27 NOTE — Assessment & Plan Note (Signed)
Remains drug free 

## 2016-07-28 ENCOUNTER — Telehealth: Payer: Self-pay | Admitting: Pediatrics

## 2016-07-28 LAB — CBC WITH DIFFERENTIAL/PLATELET
BASOS ABS: 0 {cells}/uL (ref 0–200)
Basophils Relative: 0 %
EOS PCT: 4 %
Eosinophils Absolute: 208 cells/uL (ref 15–500)
HCT: 37.2 % (ref 35.0–45.0)
Hemoglobin: 12.3 g/dL (ref 11.7–15.5)
LYMPHS PCT: 67 %
Lymphs Abs: 3484 cells/uL (ref 850–3900)
MCH: 33 pg (ref 27.0–33.0)
MCHC: 33.1 g/dL (ref 32.0–36.0)
MCV: 99.7 fL (ref 80.0–100.0)
MONOS PCT: 8 %
MPV: 9.9 fL (ref 7.5–12.5)
Monocytes Absolute: 416 cells/uL (ref 200–950)
NEUTROS PCT: 21 %
Neutro Abs: 1092 cells/uL — ABNORMAL LOW (ref 1500–7800)
Platelets: 160 10*3/uL (ref 140–400)
RBC: 3.73 MIL/uL — ABNORMAL LOW (ref 3.80–5.10)
RDW: 14.8 % (ref 11.0–15.0)
WBC: 5.2 10*3/uL (ref 3.8–10.8)

## 2016-07-28 LAB — COMPLETE METABOLIC PANEL WITH GFR
ALBUMIN: 4.4 g/dL (ref 3.6–5.1)
ALK PHOS: 74 U/L (ref 33–130)
ALT: 35 U/L — ABNORMAL HIGH (ref 6–29)
AST: 42 U/L — ABNORMAL HIGH (ref 10–35)
BILIRUBIN TOTAL: 0.4 mg/dL (ref 0.2–1.2)
BUN: 11 mg/dL (ref 7–25)
CO2: 20 mmol/L (ref 20–31)
Calcium: 9.6 mg/dL (ref 8.6–10.4)
Chloride: 108 mmol/L (ref 98–110)
Creat: 1.2 mg/dL — ABNORMAL HIGH (ref 0.50–1.05)
GFR, EST NON AFRICAN AMERICAN: 52 mL/min — AB (ref >=60)
GFR, Est African American: 60 mL/min (ref >=60)
GLUCOSE: 73 mg/dL (ref 65–99)
Potassium: 4.1 mmol/L (ref 3.5–5.3)
SODIUM: 140 mmol/L (ref 135–146)
TOTAL PROTEIN: 7.2 g/dL (ref 6.1–8.1)

## 2016-07-28 LAB — HEPATITIS A ANTIBODY, TOTAL: Hep A Total Ab: REACTIVE — AB

## 2016-07-28 LAB — HEPATITIS B SURFACE ANTIGEN: HEP B S AG: NEGATIVE

## 2016-07-28 LAB — PROTIME-INR
INR: 1
PROTHROMBIN TIME: 10.9 s (ref 9.0–11.5)

## 2016-07-28 LAB — HEPATITIS B SURFACE ANTIBODY,QUALITATIVE: HEP B S AB: POSITIVE — AB

## 2016-07-28 LAB — T-HELPER CELL (CD4) - (RCID CLINIC ONLY)
CD4 % Helper T Cell: 29 % — ABNORMAL LOW (ref 33–55)
CD4 T CELL ABS: 1090 /uL (ref 400–2700)

## 2016-07-28 LAB — HEPATITIS B CORE ANTIBODY, TOTAL: HEP B C TOTAL AB: NONREACTIVE

## 2016-07-28 NOTE — Telephone Encounter (Signed)
If she is having pain in ear she needs to be seen, I can see her today as add on if needed. If no pain she can use flonase nasal steroid, OK to send that in if she doesn't have it at home.

## 2016-07-28 NOTE — Telephone Encounter (Signed)
Pt is not having ear pain She does have flonase at home If this does not help will call for an appt

## 2016-07-29 LAB — HIV-1 RNA QUANT-NO REFLEX-BLD
HIV 1 RNA Quant: 33 copies/mL — ABNORMAL HIGH
HIV-1 RNA Quant, Log: 1.52 Log copies/mL — ABNORMAL HIGH

## 2016-08-02 ENCOUNTER — Ambulatory Visit: Payer: Self-pay | Admitting: Licensed Clinical Social Worker

## 2016-08-04 ENCOUNTER — Ambulatory Visit: Payer: Self-pay | Admitting: Licensed Clinical Social Worker

## 2016-08-04 LAB — HCV RNA, QN PCR RFLX GENO, LIPA
HCV RNA, PCR, QN: 1140000 IU/mL — ABNORMAL HIGH
HCV RNA, PCR, QN: 6.06 log IU/mL — ABNORMAL HIGH

## 2016-08-04 LAB — HEPATITIS C GENOTYPE

## 2016-08-06 LAB — LIVER FIBROSIS, FIBROTEST-ACTITEST
ALT: 30 U/L — ABNORMAL HIGH (ref 6–29)
Alpha-2-Macroglobulin: 454 mg/dL — ABNORMAL HIGH (ref 106–279)
Apolipoprotein A1: 164 mg/dL (ref 101–198)
BILIRUBIN: 0.2 mg/dL (ref 0.2–1.2)
Fibrosis Score: 0.38
GGT: 53 U/L (ref 3–70)
HAPTOGLOBIN: 121 mg/dL (ref 43–212)
Necroinflammat ACT Score: 0.16
Reference ID: 1980735

## 2016-08-10 ENCOUNTER — Ambulatory Visit (INDEPENDENT_AMBULATORY_CARE_PROVIDER_SITE_OTHER): Payer: Medicaid Other | Admitting: Licensed Clinical Social Worker

## 2016-08-10 DIAGNOSIS — F4325 Adjustment disorder with mixed disturbance of emotions and conduct: Secondary | ICD-10-CM
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
## 2016-08-25 ENCOUNTER — Other Ambulatory Visit: Payer: Self-pay | Admitting: Internal Medicine

## 2016-08-31 ENCOUNTER — Other Ambulatory Visit: Payer: Self-pay | Admitting: Pediatrics

## 2016-08-31 DIAGNOSIS — K5900 Constipation, unspecified: Secondary | ICD-10-CM

## 2016-09-06 ENCOUNTER — Other Ambulatory Visit: Payer: Self-pay | Admitting: Pediatrics

## 2016-09-18 ENCOUNTER — Ambulatory Visit: Payer: Medicaid Other | Admitting: Pediatrics

## 2016-09-21 ENCOUNTER — Other Ambulatory Visit: Payer: Self-pay | Admitting: Internal Medicine

## 2016-09-27 ENCOUNTER — Ambulatory Visit (INDEPENDENT_AMBULATORY_CARE_PROVIDER_SITE_OTHER): Payer: Medicaid Other | Admitting: Pediatrics

## 2016-09-27 ENCOUNTER — Encounter: Payer: Self-pay | Admitting: Pediatrics

## 2016-09-27 VITALS — BP 108/74 | HR 60 | Temp 97.5°F | Ht <= 58 in | Wt 117.8 lb

## 2016-09-27 DIAGNOSIS — K5900 Constipation, unspecified: Secondary | ICD-10-CM | POA: Diagnosis not present

## 2016-09-27 DIAGNOSIS — E119 Type 2 diabetes mellitus without complications: Secondary | ICD-10-CM | POA: Diagnosis not present

## 2016-09-27 DIAGNOSIS — I10 Essential (primary) hypertension: Secondary | ICD-10-CM | POA: Diagnosis not present

## 2016-09-27 DIAGNOSIS — F419 Anxiety disorder, unspecified: Secondary | ICD-10-CM | POA: Diagnosis not present

## 2016-09-27 DIAGNOSIS — R634 Abnormal weight loss: Secondary | ICD-10-CM | POA: Diagnosis not present

## 2016-09-27 LAB — BAYER DCA HB A1C WAIVED: HB A1C: 5.6 % (ref <7.0)

## 2016-09-27 MED ORDER — CLONIDINE HCL 0.2 MG PO TABS: 0.2000 mg | ORAL_TABLET | Freq: Two times a day (BID) | ORAL | 0 refills | Status: DC

## 2016-09-27 MED ORDER — POLYETHYLENE GLYCOL 3350 17 G PO PACK: 17.0000 g | PACK | Freq: Every day | ORAL | 3 refills | Status: DC

## 2016-09-27 MED ORDER — CARBAMIDE PEROXIDE 6.5 % OT SOLN: 5.0000 [drp] | Freq: Two times a day (BID) | OTIC | 2 refills | Status: DC

## 2016-09-27 MED ORDER — FLUOXETINE HCL 20 MG PO CAPS: 20.0000 mg | ORAL_CAPSULE | Freq: Every day | ORAL | 1 refills | Status: DC

## 2016-09-27 NOTE — Progress Notes (Signed)
  Subjective:   Patient ID: Ashley [name], female    DOB: 03-15-1965, 51 y.o.   MRN: [id number] CC: Follow-up (3 month) multiple med problems HPI: Ashley [name] is a 51 y.o. female presenting for Follow-up (3 month)  Has lost apprx 9 lbs in last 3-4 mo Says she has been skipping meals Under a lot of stress Saw a counselor, has upcoming appt with psychiatrist, has run out of her meds  Sometimes getting food is hard, goes to food banks regularly Money has been very stressful  Has tingling in fingers b/longoing for years  HIV: follows with ID, taking meds regularly  Tobacco use: not interested in cessation at this time  Still regularly with constipation, thinks that is affecting her appetite and eating Sometimes skipping several days between stools No fevers  Ears very itchy, have been bothering her  Relevant past medical, surgical, family and social history reviewed. Allergies and medications reviewed and updated. History  Smoking Status  . Current Every Day Smoker  . Packs/day: 1.00  . Years: 35.00  . Types: Cigarettes  Smokeless Tobacco  . Never Used    Comment: 1 pack every 2-3 days   ROS: Per HPI   Objective:    BP 108/74   Pulse 60   Temp (!) 97.5 F (36.4 C) (Oral)   Ht 4' 7.5" (1.41 m)   Wt 117 lb 12.8 oz (53.4 kg)   BMI 26.89 kg/m   Wt Readings from Last 3 Encounters:  09/27/16 117 lb 12.8 oz (53.4 kg)  07/27/16 121 lb (54.9 kg)  06/19/16 126 lb 12.8 oz (57.5 kg)    Gen: NAD, alert, cooperative with exam, NCAT EYES: EOMI, no conjunctival injection, or no icterus ENT:  TMs pearly gray b/l, some cerumen b/l, non-obstructing, OP without erythema LYMPH: no cervical LAD CV: NRRR, normal S1/S2, no murmur, distal pulses 2+ b/l Resp: CTABL, no wheezes, normal WOB Abd: +BS, soft Ext: No edema, warm Neuro: Alert and oriented, strength equal b/l UE and LE, coordination grossly normal MSK: normal muscle bulk  Assessment & Plan:  [place] was seen today for  follow-up med problems  Diagnoses and all orders for this visit:  Type 2 diabetes mellitus without complication, without long-term current use of insulin (HCC) Diet controlled -     Bayer DCA Hb A1c Waived  Weight loss Gave list of local food resources, discussed eating smaller more frequent meals Will treat constipation -     BMP8+EGFR -     TSH -     Folate -     Vitamin B12  Anxiety Restart below, has upcoming appt with psychiatry -     FLUoxetine (PROZAC) 20 MG capsule; Take 1 capsule (20 mg total) by mouth daily.  Constipation, unspecified constipation type Take 1-2 times daily, goal daily stooling -     polyethylene glycol (MIRALAX / GLYCOLAX) packet; Take 17 g by mouth daily.  Essential hypertension BP well controlled, denies lightheadedness Cont current meds -     cloNIDine (CATAPRES) 0.2 MG tablet; Take 1 tablet (0.2 mg total) by mouth 2 (two) times daily.  Other orders -     carbamide peroxide (DEBROX) 6.5 % OTIC solution; Place 5 drops into both ears 2 (two) times daily.   Follow up plan: 3 mo [name], MD [place]

## 2016-09-28 LAB — BMP8+EGFR
BUN/Creatinine Ratio: 6 — ABNORMAL LOW (ref 9–23)
BUN: 7 mg/dL (ref 6–24)
CALCIUM: 9.5 mg/dL (ref 8.7–10.2)
CO2: 19 mmol/L — AB (ref 20–29)
CREATININE: 1.15 mg/dL — AB (ref 0.57–1.00)
Chloride: 105 mmol/L (ref 96–106)
GFR calc Af Amer: 63 mL/min/{1.73_m2} (ref >59)
GFR, EST NON AFRICAN AMERICAN: 54 mL/min/{1.73_m2} — AB (ref >59)
Glucose: 87 mg/dL (ref 65–99)
POTASSIUM: 4.8 mmol/L (ref 3.5–5.2)
Sodium: 142 mmol/L (ref 134–144)

## 2016-09-28 LAB — TSH: TSH: 0.82 u[IU]/mL (ref 0.450–4.500)

## 2016-09-28 LAB — FOLATE: Folate: 7.6 ng/mL (ref >3.0)

## 2016-09-28 LAB — VITAMIN B12: Vitamin B-12: 831 pg/mL (ref 232–1245)

## 2016-09-29 ENCOUNTER — Telehealth: Payer: Self-pay | Admitting: Pediatrics

## 2016-09-29 NOTE — Telephone Encounter (Signed)
Pt notified rxs were sent into pharmacy Pt will contact pharmacy

## 2016-10-02 ENCOUNTER — Telehealth: Payer: Self-pay | Admitting: Pediatrics

## 2016-10-03 ENCOUNTER — Other Ambulatory Visit: Payer: Self-pay | Admitting: Pediatrics

## 2016-10-03 DIAGNOSIS — J439 Emphysema, unspecified: Secondary | ICD-10-CM

## 2016-10-03 MED ORDER — DICLOFENAC SODIUM 1 % TD GEL
2.0000 g | Freq: Four times a day (QID) | TRANSDERMAL | 0 refills | Status: DC
Start: 2016-10-03 — End: 2016-10-20

## 2016-10-03 NOTE — Telephone Encounter (Signed)
Spoke with pt and she states you were going to give her something for decreased energy and she doesn't understand why you haven't or why you don't remember. I told her I would send you a message and she said I needed to come verbalize to you not send a message and started yelling and then hung up. I can see from your office notes she is out of her psych meds and was to f/u with them soon, not sure if you would like to call her back or please advise?

## 2016-10-05 MED ORDER — MULTI-VITAMINS PO TABS: 1.0000 | ORAL_TABLET | Freq: Every day | ORAL | 3 refills | Status: DC

## 2016-10-05 NOTE — Telephone Encounter (Signed)
Called back, sent in MVI.

## 2016-10-17 ENCOUNTER — Other Ambulatory Visit: Payer: Self-pay | Admitting: Pediatrics

## 2016-10-17 DIAGNOSIS — G8929 Other chronic pain: Secondary | ICD-10-CM

## 2016-10-17 DIAGNOSIS — M545 Low back pain: Principal | ICD-10-CM

## 2016-10-18 NOTE — Telephone Encounter (Signed)
Patient aware and verbalizes understanding. 

## 2016-10-19 ENCOUNTER — Telehealth: Payer: Self-pay | Admitting: Pediatrics

## 2016-10-19 NOTE — Telephone Encounter (Signed)
Please review and advise.

## 2016-10-20 ENCOUNTER — Other Ambulatory Visit: Payer: Self-pay | Admitting: Pediatrics

## 2016-10-24 NOTE — Telephone Encounter (Signed)
I don't know of any ear wax drops that are covered by [facility]

## 2016-11-01 ENCOUNTER — Telehealth: Payer: Self-pay | Admitting: Pediatrics

## 2016-11-01 NOTE — Telephone Encounter (Signed)
See telephone note from 8/23, have tried to contact pt, not able to. I dont know of ear wax medication covered by medicaid. Can irrigate her ears here in clinic if ear wax still bothers her.

## 2016-11-01 NOTE — Telephone Encounter (Signed)
Pt notified of recommendation Will call back if sxs persist or worsen

## 2016-11-10 ENCOUNTER — Ambulatory Visit: Payer: Self-pay | Admitting: Pediatrics

## 2016-11-13 ENCOUNTER — Encounter: Payer: Self-pay | Admitting: Pediatrics

## 2016-11-14 ENCOUNTER — Ambulatory Visit: Payer: Self-pay | Admitting: Certified Nurse Midwife

## 2016-11-17 ENCOUNTER — Other Ambulatory Visit: Payer: Self-pay | Admitting: Internal Medicine

## 2016-11-17 ENCOUNTER — Other Ambulatory Visit: Payer: Self-pay | Admitting: Pediatrics

## 2016-11-17 DIAGNOSIS — B20 Human immunodeficiency virus [HIV] disease: Secondary | ICD-10-CM

## 2016-11-20 ENCOUNTER — Telehealth: Payer: Self-pay | Admitting: *Deleted

## 2016-11-20 ENCOUNTER — Inpatient Hospital Stay (HOSPITAL_COMMUNITY)
Admission: AD | Admit: 2016-11-20 | Discharge: 2016-11-21 | Disposition: A | Discharge status: Home / Self Care | Payer: Medicaid Other | Source: Ambulatory Visit | Attending: Family Medicine | Admitting: Family Medicine

## 2016-11-20 DIAGNOSIS — R140 Abdominal distension (gaseous): Secondary | ICD-10-CM | POA: Insufficient documentation

## 2016-11-20 DIAGNOSIS — Z79899 Other long term (current) drug therapy: Secondary | ICD-10-CM | POA: Insufficient documentation

## 2016-11-20 DIAGNOSIS — Z9049 Acquired absence of other specified parts of digestive tract: Secondary | ICD-10-CM | POA: Insufficient documentation

## 2016-11-20 DIAGNOSIS — F17210 Nicotine dependence, cigarettes, uncomplicated: Secondary | ICD-10-CM | POA: Insufficient documentation

## 2016-11-20 DIAGNOSIS — Z9889 Other specified postprocedural states: Secondary | ICD-10-CM | POA: Insufficient documentation

## 2016-11-20 DIAGNOSIS — F458 Other somatoform disorders: Secondary | ICD-10-CM

## 2016-11-20 DIAGNOSIS — M5136 Other intervertebral disc degeneration, lumbar region: Secondary | ICD-10-CM | POA: Insufficient documentation

## 2016-11-20 DIAGNOSIS — R109 Unspecified abdominal pain: Secondary | ICD-10-CM

## 2016-11-20 DIAGNOSIS — I10 Essential (primary) hypertension: Secondary | ICD-10-CM | POA: Insufficient documentation

## 2016-11-20 DIAGNOSIS — B20 Human immunodeficiency virus [HIV] disease: Secondary | ICD-10-CM | POA: Insufficient documentation

## 2016-11-20 DIAGNOSIS — F209 Schizophrenia, unspecified: Secondary | ICD-10-CM | POA: Insufficient documentation

## 2016-11-20 DIAGNOSIS — E119 Type 2 diabetes mellitus without complications: Secondary | ICD-10-CM | POA: Insufficient documentation

## 2016-11-20 DIAGNOSIS — M549 Dorsalgia, unspecified: Secondary | ICD-10-CM | POA: Insufficient documentation

## 2016-11-20 NOTE — Telephone Encounter (Signed)
Incoming call from [place], [facility] Director Director was needing confirmation of pregnancy on pt Pt is applying for [facility] stating she is due next month and is pregnant with twins. Pt had several inconsistencies in interview Director was concerned regarding the inconsistencies and wanted to make her PCP  aware

## 2016-11-21 ENCOUNTER — Inpatient Hospital Stay (HOSPITAL_COMMUNITY): Payer: Medicaid Other

## 2016-11-21 ENCOUNTER — Ambulatory Visit (INDEPENDENT_AMBULATORY_CARE_PROVIDER_SITE_OTHER): Payer: Medicaid Other | Admitting: Pediatrics

## 2016-11-21 ENCOUNTER — Encounter (HOSPITAL_COMMUNITY): Payer: Self-pay | Admitting: *Deleted

## 2016-11-21 ENCOUNTER — Encounter: Payer: Self-pay | Admitting: Pediatrics

## 2016-11-21 ENCOUNTER — Telehealth: Payer: Self-pay

## 2016-11-21 VITALS — BP 135/96 | HR 93 | Temp 97.6°F | Ht <= 58 in | Wt 113.8 lb

## 2016-11-21 DIAGNOSIS — Z9889 Other specified postprocedural states: Secondary | ICD-10-CM | POA: Diagnosis not present

## 2016-11-21 DIAGNOSIS — E119 Type 2 diabetes mellitus without complications: Secondary | ICD-10-CM | POA: Diagnosis not present

## 2016-11-21 DIAGNOSIS — K219 Gastro-esophageal reflux disease without esophagitis: Secondary | ICD-10-CM | POA: Diagnosis not present

## 2016-11-21 DIAGNOSIS — F458 Other somatoform disorders: Secondary | ICD-10-CM | POA: Diagnosis not present

## 2016-11-21 DIAGNOSIS — Z720 Tobacco use: Secondary | ICD-10-CM

## 2016-11-21 DIAGNOSIS — F209 Schizophrenia, unspecified: Secondary | ICD-10-CM | POA: Diagnosis not present

## 2016-11-21 DIAGNOSIS — I10 Essential (primary) hypertension: Secondary | ICD-10-CM

## 2016-11-21 DIAGNOSIS — Z9049 Acquired absence of other specified parts of digestive tract: Secondary | ICD-10-CM | POA: Diagnosis not present

## 2016-11-21 DIAGNOSIS — M5136 Other intervertebral disc degeneration, lumbar region: Secondary | ICD-10-CM | POA: Diagnosis not present

## 2016-11-21 DIAGNOSIS — F17210 Nicotine dependence, cigarettes, uncomplicated: Secondary | ICD-10-CM | POA: Diagnosis not present

## 2016-11-21 DIAGNOSIS — Z79899 Other long term (current) drug therapy: Secondary | ICD-10-CM | POA: Diagnosis not present

## 2016-11-21 DIAGNOSIS — R109 Unspecified abdominal pain: Secondary | ICD-10-CM

## 2016-11-21 DIAGNOSIS — Z1211 Encounter for screening for malignant neoplasm of colon: Secondary | ICD-10-CM | POA: Diagnosis not present

## 2016-11-21 DIAGNOSIS — M549 Dorsalgia, unspecified: Secondary | ICD-10-CM | POA: Diagnosis not present

## 2016-11-21 DIAGNOSIS — R140 Abdominal distension (gaseous): Secondary | ICD-10-CM | POA: Diagnosis not present

## 2016-11-21 DIAGNOSIS — B20 Human immunodeficiency virus [HIV] disease: Secondary | ICD-10-CM | POA: Diagnosis not present

## 2016-11-21 LAB — CBC WITH DIFFERENTIAL/PLATELET
Basophils Absolute: 0 10*3/uL (ref 0.0–0.1)
Basophils Relative: 0 %
Eosinophils Absolute: 0.1 10*3/uL (ref 0.0–0.7)
Eosinophils Relative: 3 %
HCT: 33.1 % — ABNORMAL LOW (ref 36.0–46.0)
Hemoglobin: 11.8 g/dL — ABNORMAL LOW (ref 12.0–15.0)
Lymphocytes Relative: 40 %
Lymphs Abs: 1.9 10*3/uL (ref 0.7–4.0)
MCH: 34.1 pg — ABNORMAL HIGH (ref 26.0–34.0)
MCHC: 35.6 g/dL (ref 30.0–36.0)
MCV: 95.7 fL (ref 78.0–100.0)
Monocytes Absolute: 0.5 10*3/uL (ref 0.1–1.0)
Monocytes Relative: 10 %
Neutro Abs: 2.2 10*3/uL (ref 1.7–7.7)
Neutrophils Relative %: 47 %
Platelets: 147 10*3/uL — ABNORMAL LOW (ref 150–400)
RBC: 3.46 MIL/uL — ABNORMAL LOW (ref 3.87–5.11)
RDW: 13.8 % (ref 11.5–15.5)
WBC: 4.6 10*3/uL (ref 4.0–10.5)

## 2016-11-21 LAB — URINALYSIS, ROUTINE W REFLEX MICROSCOPIC
Bilirubin Urine: NEGATIVE
GLUCOSE, UA: NEGATIVE mg/dL
Hgb urine dipstick: NEGATIVE
Ketones, ur: NEGATIVE mg/dL
LEUKOCYTES UA: NEGATIVE
Nitrite: NEGATIVE
PH: 7 (ref 5.0–8.0)
PROTEIN: NEGATIVE mg/dL
SPECIFIC GRAVITY, URINE: 1.018 (ref 1.005–1.030)

## 2016-11-21 LAB — COMPREHENSIVE METABOLIC PANEL
ALBUMIN: 4.3 g/dL (ref 3.5–5.0)
ALT: 77 U/L — ABNORMAL HIGH (ref 14–54)
ANION GAP: 8 (ref 5–15)
AST: 73 U/L — ABNORMAL HIGH (ref 15–41)
Alkaline Phosphatase: 66 U/L (ref 38–126)
BILIRUBIN TOTAL: 0.8 mg/dL (ref 0.3–1.2)
BUN: 7 mg/dL (ref 6–20)
CHLORIDE: 106 mmol/L (ref 101–111)
CO2: 27 mmol/L (ref 22–32)
Calcium: 9.3 mg/dL (ref 8.9–10.3)
Creatinine, Ser: 1.09 mg/dL — ABNORMAL HIGH (ref 0.44–1.00)
GFR calc Af Amer: >60 mL/min (ref >60)
GFR calc non Af Amer: 57 mL/min — ABNORMAL LOW (ref >60)
GLUCOSE: 101 mg/dL — AB (ref 65–99)
POTASSIUM: 3.5 mmol/L (ref 3.5–5.1)
SODIUM: 141 mmol/L (ref 135–145)
TOTAL PROTEIN: 7.3 g/dL (ref 6.5–8.1)

## 2016-11-21 LAB — POCT PREGNANCY, URINE: PREG TEST UR: NEGATIVE

## 2016-11-21 MED ORDER — LACTATED RINGERS IV SOLN: INTRAVENOUS | Status: DC

## 2016-11-21 MED ORDER — IOPAMIDOL (ISOVUE-300) INJECTION 61%
100.0000 mL | Freq: Once | INTRAVENOUS | Status: AC | PRN
  Administered 2016-11-21: 80 mL via INTRAVENOUS

## 2016-11-21 MED ORDER — PANTOPRAZOLE SODIUM 40 MG PO TBEC: 40.0000 mg | DELAYED_RELEASE_TABLET | Freq: Every day | ORAL | 6 refills | Status: DC

## 2016-11-21 MED ORDER — IOPAMIDOL (ISOVUE-300) INJECTION 61%
30.0000 mL | INTRAVENOUS | Status: AC
  Administered 2016-11-21: 30 mL via ORAL

## 2016-11-21 MED ORDER — ONDANSETRON 8 MG PO TBDP
8.0000 mg | ORAL_TABLET | Freq: Once | ORAL | Status: AC
  Administered 2016-11-21: 8 mg via ORAL
  Filled 2016-11-21: qty 1

## 2016-11-21 NOTE — Telephone Encounter (Signed)
Since [facility] call yesterday pt seen in ED, pt is not pregnant. Can you schedule appt with me?

## 2016-11-21 NOTE — MAU Note (Signed)
Pt was presented with discharge paper work and pt came out side of her room and as she was walking to the bathroom and was asking "then why does her back hurt, and why does her stomach quiver when she gets mad, and why does her stomach look like this?" provider informed pt that we do not have all of those answers but explained to her that she does have a slipped disk in her back that the pt is aware of, the provider stated we do not have all of those answers about her back because we are a OBGYN emergency room and pt stated "yall dont have the answers but God do" pt then laughed and returned to her room and laid down and started asking the same questions again in her room alone. Pt is talking to herself now.

## 2016-11-21 NOTE — Telephone Encounter (Signed)
Patient reports she has made an appointment today at 2:45 pm with you.

## 2016-11-21 NOTE — MAU Note (Signed)
Pt attempted to pull her IV out. RN was able to step in and take it out herself.

## 2016-11-21 NOTE — Progress Notes (Signed)
  Subjective:   Patient ID: Ashley [name], female    DOB: 12/11/1965, 51 y.o.   MRN: [id number] CC: Nausea; Abdomen tightness; and Bloated  HPI: Ashley [name] is a 51 y.o. female presenting for Nausea; Abdomen tightness; and Bloated H/o HIV, hep c Following with ID  Tobacco use: ongoing, trying to decrease amount  Feeling nauseous throughout the day some days Eating 3 times a day plus a snack Says food can make nausea worse  Bothered that her stomach remains protuberant Says people keep telling her she looks pregnant Seen in ED last night, negative preg test, had normal abd CT scan  Abnormal pap smear (HPV+) last year, missed appt for repeat pap last week   No chest pain, SOB  Relevant past medical, surgical, family and social history reviewed. Allergies and medications reviewed and updated. History  Smoking Status  . Current Every Day Smoker  . Packs/day: 1.00  . Years: 35.00  . Types: Cigarettes  Smokeless Tobacco  . Never Used    Comment: 1 pack every 2-3 days   ROS: Per HPI   Objective:    BP (!) 135/96   Pulse 93   Temp 97.6 F (36.4 C) (Oral)   Ht 4' 7.5" (1.41 m)   Wt 113 lb 12.8 oz (51.6 kg)   BMI 25.98 kg/m   Wt Readings from Last 3 Encounters:  11/21/16 113 lb 12.8 oz (51.6 kg)  11/21/16 115 lb (52.2 kg)  09/27/16 117 lb 12.8 oz (53.4 kg)    Gen: NAD, alert, cooperative with exam, NCAT EYES: EOMI, no conjunctival injection, or no icterus ENT:  TMs pearly gray b/l, OP without erythema LYMPH: no cervical LAD CV: NRRR, normal S1/S2, no murmur, distal pulses 2+ b/l Resp: CTABL, no wheezes, normal WOB Abd: +BS, soft, mildly tender with palpation epigastric area, periumbilical, ND.  Ext: No edema, warm Neuro: Alert and oriented  Assessment & Plan:  Ashley [name] was seen today for nausea, abdomen tightness and bloated.  Diagnoses and all orders for this visit:  Gastroesophageal reflux disease, esophagitis presence not specified CT scan normal last  night Labs wnl Exam benign today Trial of below Return precautiosn discussed -     pantoprazole (PROTONIX) 40 MG tablet; Take 1 tablet (40 mg total) by mouth daily.  Essential hypertension Slightly elevated, cont current meds  Screen for colon cancer Refer to GI  Tobacco use Cont to encourage cessation  Follow up plan: Return in about 5 weeks (around 12/26/2016). [name], MD [place]

## 2016-11-21 NOTE — MAU Note (Signed)
Pt presents to MAU via EMS c/o of abdominal ctxs  Pt reports she is [redacted]weeks pregnant. Pt collected a urine sample and a pregnancy test was completed and results were NEG.

## 2016-11-21 NOTE — Patient Instructions (Addendum)
[facility] transportation [facility] [phone] for help with transportation to appointments No appointments after 3pm Will need advance notice   Call gynecology to set up follow up pap smear  I put referral in to GI for a colonoscopy  Stop the zantac, start pantoprazole  I want you to get in to see [facility]

## 2016-11-21 NOTE — MAU Provider Note (Signed)
[facility] Attending MAU Note  Chief Complaint: Abdominal and back pain   SUBJECTIVE Ashley [name] is a 51 y.o. G2P2  who presents with several month history of weight loss (15 pounds and unintentional) and increasing abdominal distension. She reports nausea and vomiting daily. Reports low back pain is constant and radiates to neck. Her stomach "quivers" especially when she gets angry. States she is feeling movement and arrived via EMS stating she was pregnant. She denies having PNC this pregnancy or an u/s. She has a PMH significant for hep C and HIV and schizophrenia. There are office notes suggesting she went to [facility] and stated she was pregnant with twins. She notes that she has been on psych meds in the past but states she is "not crazy" and no longer needs them. States she does take her Prozac. Denies auditory or visual hallucinations right now. States she is taking her other meds including for HIV. She also has HTN and is on meds. Has a PCP. States she is feeling movement and arrived via EMS stating she was pregnant. She denies having PNC this pregnancy or an u/s.  Past Medical History:  Diagnosis Date  . Ankle fracture   . Anxiety   . Asthma   . Collagen vascular disease ([place])   . Depression   . Diabetes mellitus   . Hepatitis C   . Hernia   . HIV (human immunodeficiency virus infection) ([place])   . HIV (human immunodeficiency virus infection) ([place])   . Hypertension   . Pinched nerve   . Scoliosis   . TB (tuberculosis)    OB History  Gravida Para Term Preterm AB Living  2 2       2   SAB TAB Ectopic Multiple Live Births               # Outcome Date GA Lbr Len/2nd Weight Sex Delivery Anes PTL Lv  2 Para      CS-LTranv     1 Para      CS-LTranv        Past Surgical History:  Procedure Laterality Date  . CESAREAN SECTION    . CHOLECYSTECTOMY    . HERNIA REPAIR    . LIVER BIOPSY     Social History   Social History  . Marital status: Single    Spouse name:  N/A  . Number of children: N/A  . Years of education: N/A   Occupational History  . Not on file.   Social History Main Topics  . Smoking status: Current Every Day Smoker    Packs/day: 1.00    Years: 35.00    Types: Cigarettes  . Smokeless tobacco: Never Used     Comment: 1 pack every 2-3 days  . Alcohol use No  . Drug use: No  . Sexual activity: Yes    Birth control/ protection: Condom     Comment: given condoms   Other Topics Concern  . Not on file   Social History Narrative  . No narrative on file   No current facility-administered medications on file prior to encounter.    Current Outpatient Prescriptions on File Prior to Encounter  Medication Sig Dispense Refill  . cetirizine (ZYRTEC) 10 MG tablet Take 1 tablet (10 mg total) by mouth daily. 30 tablet 11  . cloNIDine (CATAPRES) 0.2 MG tablet Take 1 tablet (0.2 mg total) by mouth 2 (two) times daily.  0  . cloNIDine (CATAPRES) 0.2 MG tablet TAKE ONE  TABLET BY MOUTH 2 TIMES A DAY 180 tablet 0  . fexofenadine (ALLEGRA) 180 MG tablet Take 180 mg by mouth daily.    . Multiple Vitamin (MULTI-VITAMINS) TABS Take 1 tablet by mouth daily. 90 tablet 3  . ranitidine (ZANTAC) 150 MG tablet Take 1 tablet (150 mg total) by mouth 2 (two) times daily. 180 tablet 5  . TIVICAY 50 MG tablet TAKE ONE TABLET BY MOUTH EVERY DAY 30 tablet 3  . acetaminophen (TYLENOL) 325 MG tablet Take 650 mg by mouth every 6 (six) hours as needed for mild pain. Reported on 06/16/2015    . Black Cohosh 40 MG CAPS Take by mouth.    . carbamide peroxide (DEBROX) 6.5 % OTIC solution Place 5 drops into both ears 2 (two) times daily. 15 mL 2  . DESCOVY 200-25 MG tablet TAKE 1 TABLET BY MOUTH DAILY 30 tablet 3  . FLUoxetine (PROZAC) 20 MG capsule Take 1 capsule (20 mg total) by mouth daily. 90 capsule 1  . fluticasone (FLONASE) 50 MCG/ACT nasal spray Place 2 sprays into both nostrils daily.    . Glecaprevir-Pibrentasvir (MAVYRET) 100-40 MG TABS Take 3 tablets by  mouth daily. 84 tablet 1  . lisinopril (PRINIVIL,ZESTRIL) 40 MG tablet Take 40 mg by mouth daily.    . megestrol (MEGACE) 40 MG tablet TAKE ONE TABLET BY MOUTH 2 TIMES A DAY 60 tablet 0  . methocarbamol (ROBAXIN) 500 MG tablet TAKE ONE TABLET BY MOUTH EVERY 8 HOURS AS NEEDED FOR MUSCLE SPASMS 30 tablet 0  . Multiple Vitamins-Minerals (HAIR SKIN AND NAILS FORMULA PO) Take by mouth.    . polyethylene glycol (MIRALAX / GLYCOLAX) packet Take 17 g by mouth daily. 90 each 3  . PROVENTIL HFA 108 (90 Base) MCG/ACT inhaler INHALE 2 PUFFS INTO LUNGS EVERY 6 HOURS AS NEEDED FOR WHEEZING OR SHORTNESS OF BREATH. 6.7 g 2  . VOLTAREN 1 % GEL APPLY 2 GRAMS TOPICALLY FOUR TIMES A DAY 100 g 2   Allergies  Allergen Reactions  . Aspirin Other (See Comments)    Liver problems    ROS: Pertinent items in HPI  OBJECTIVE BP 134/90   Pulse 81  CONSTITUTIONAL: Poorly nourished female in no acute distress who appears somewhat disheveled.  HENT:  Normocephalic, atraumatic, External right and left ear normal. Dentition is not well maintained EYES: Conjunctivae and EOM are normal. No scleral icterus but they are muddy.  NECK: Normal range of motion, supple SKIN: Skin is warm and dry. No rash noted. Not diaphoretic. No erythema. No pallor. [place]: Alert and oriented to person, place, and time.  PSYCHIATRIC: does not appear to respond to external stimuli CARDIOVASCULAR: Normal heart rate noted RESPIRATORY: Effort and breath sounds normal, no problems with respiration noted. ABDOMEN: Soft, normal bowel sounds, quite protuberant, possible shifting dullness, no mass appreciated, large umbilical hernia with mesh palpable but easily reducible, tenderness to palpation in RUQ MUSCULOSKELETAL: Normal range of motion. No tenderness.   LAB RESULTS Results for orders placed or performed during the hospital encounter of 11/20/16 (from the past 48 hour(s))  Pregnancy, urine POC     Status: None   Collection Time: 11/21/16  12:09 AM  Result Value Ref Range   Preg Test, Ur NEGATIVE NEGATIVE    Comment:        THE SENSITIVITY OF THIS METHODOLOGY IS >24 mIU/mL    CBC    Component Value Date/Time   WBC 4.6 11/21/2016 0102   RBC 3.46 (L) 11/21/2016 0102  HGB 11.8 (L) 11/21/2016 0102   HGB 10.9 (L) 10/16/2013 0553   HCT 33.1 (L) 11/21/2016 0102   HCT 31.9 (L) 10/16/2013 0553   PLT 147 (L) 11/21/2016 0102   PLT 78 (L) 10/16/2013 0553   MCV 95.7 11/21/2016 0102   MCV 94 10/16/2013 0553   MCH 34.1 (H) 11/21/2016 0102   MCHC 35.6 11/21/2016 0102   RDW 13.8 11/21/2016 0102   RDW 14.4 10/16/2013 0553   LYMPHSABS 1.9 11/21/2016 0102   LYMPHSABS 0.9 (L) 10/16/2013 0553   MONOABS 0.5 11/21/2016 0102   MONOABS 0.8 10/16/2013 0553   EOSABS 0.1 11/21/2016 0102   EOSABS 0.0 10/16/2013 0553   BASOSABS 0.0 11/21/2016 0102   BASOSABS 0.0 10/16/2013 0553   CMP Latest Ref Rng & Units 11/21/2016 09/27/2016 07/27/2016  Glucose 65 - 99 mg/dL 101(H) 87 73  BUN 6 - 20 mg/dL 7 7 11   Creatinine 0.44 - 1.00 mg/dL 1.09(H) 1.15(H) 1.20(H)  Sodium 135 - 145 mmol/L 141 142 140  Potassium 3.5 - 5.1 mmol/L 3.5 4.8 4.1  Chloride 101 - 111 mmol/L 106 105 108  CO2 22 - 32 mmol/L 27 19(L) 20  Calcium 8.9 - 10.3 mg/dL 9.3 9.5 9.6  Total Protein 6.5 - 8.1 g/dL 7.3 - 7.2  Total Bilirubin 0.3 - 1.2 mg/dL 0.8 - 0.4  Alkaline Phos 38 - 126 U/L 66 - 74  AST 15 - 41 U/L 73(H) - 42(H)  ALT 14 - 54 U/L 77(H) - 35(H)    IMAGING Ct Abdomen Pelvis W Contrast  Result Date: 11/21/2016 CLINICAL DATA:  Abdominal distention EXAM: CT ABDOMEN AND PELVIS WITH CONTRAST TECHNIQUE: Multidetector CT imaging of the abdomen and pelvis was performed using the standard protocol following bolus administration of intravenous contrast. CONTRAST:  20mL ISOVUE-300 IOPAMIDOL (ISOVUE-300) INJECTION 61% COMPARISON:  CT abdomen pelvis 05/09/2014 FINDINGS: Lower chest: No pulmonary nodules or pleural effusion. No visible pericardial effusion. Hepatobiliary: Normal  hepatic contours and density. No visible biliary dilatation. Status post cholecystectomy. Pancreas: Normal contours without ductal dilatation. No peripancreatic fluid collection. Spleen: Normal. Adrenals/Urinary Tract: --Adrenal glands: Normal. --Right kidney/ureter: No hydronephrosis or perinephric stranding. No nephrolithiasis. No obstructing ureteral stones. --Left kidney/ureter: No hydronephrosis or perinephric stranding. No nephrolithiasis. No obstructing ureteral stones. --Urinary bladder: Unremarkable. Stomach/Bowel: --Stomach/Duodenum: No hiatal hernia or other gastric abnormality. Normal duodenal course and caliber. --Small bowel: No dilatation or inflammation. --Colon: No focal abnormality. --Appendix: Normal. Vascular/Lymphatic: Atherosclerotic calcification is present within the non-aneurysmal abdominal aorta, without hemodynamically significant stenosis. No abdominal or pelvic lymphadenopathy. Reproductive: Normal uterus and ovaries. Musculoskeletal. Multilevel degenerative disc disease and facet arthrosis. No bony spinal canal stenosis. Other: Periumbilical abdominal muscle diastasis. No herniated bowel. IMPRESSION: 1. No acute abnormality of the abdomen or pelvis. 2.  Aortic Atherosclerosis (ICD10-I70.0). Electronically Signed   By: Ulyses Jarred M.D.   On: 11/21/2016 04:03    MAU COURSE  ASSESSMENT Pseudocyesis - Plan: Discharge patient  Abdominal pain, unspecified abdominal location - Plan: Discharge patient  Schizophrenia, unspecified type ([place]) - Plan: Discharge patient  Distended abdomen  Degenerative disc disease, lumbar  Mildly worsening LFT's--? Med related vs. Hep C  Consider increasing psych involvement as patient was convinced she was pregnant, although she denied hallucinations to me--she finally admitted someone else had told her she was not pregnant. Additionally, at some point she added having twins.   She has a quite protuberant abdomen and a hernia that despite  prior attempts at fixing with mesh is still present. There is  no evidence of ascites on CT and her reproductive organs are normal in size.  Assume her back pain is related to her disc disease.  PLAN Discharge home  Allergies as of 11/21/2016      Reactions   Aspirin Other (See Comments)   Liver problems      Medication List    TAKE these medications   acetaminophen 325 MG tablet Commonly known as:  TYLENOL Take 650 mg by mouth every 6 (six) hours as needed for mild pain. Reported on 06/16/2015   amLODipine 10 MG tablet Commonly known as:  NORVASC Take 10 mg by mouth daily.   Black Cohosh 40 MG Caps Take by mouth.   carbamide peroxide 6.5 % OTIC solution Commonly known as:  DEBROX Place 5 drops into both ears 2 (two) times daily.   cetirizine 10 MG tablet Commonly known as:  ZYRTEC Take 1 tablet (10 mg total) by mouth daily.   cloNIDine 0.2 MG tablet Commonly known as:  CATAPRES Take 1 tablet (0.2 mg total) by mouth 2 (two) times daily.   cloNIDine 0.2 MG tablet Commonly known as:  CATAPRES TAKE ONE TABLET BY MOUTH 2 TIMES A DAY   DESCOVY 200-25 MG tablet Generic drug:  emtricitabine-tenofovir AF TAKE 1 TABLET BY MOUTH DAILY   fexofenadine 180 MG tablet Commonly known as:  ALLEGRA Take 180 mg by mouth daily.   FLUoxetine 20 MG capsule Commonly known as:  PROZAC Take 1 capsule (20 mg total) by mouth daily.   fluticasone 50 MCG/ACT nasal spray Commonly known as:  FLONASE Place 2 sprays into both nostrils daily.   Glecaprevir-Pibrentasvir 100-40 MG Tabs Commonly known as:  MAVYRET Take 3 tablets by mouth daily.   HAIR SKIN AND NAILS FORMULA PO Take by mouth.   lisinopril 40 MG tablet Commonly known as:  PRINIVIL,ZESTRIL Take 40 mg by mouth daily.   megestrol 40 MG tablet Commonly known as:  MEGACE TAKE ONE TABLET BY MOUTH 2 TIMES A DAY   methocarbamol 500 MG tablet Commonly known as:  ROBAXIN TAKE ONE TABLET BY MOUTH EVERY 8 HOURS AS NEEDED FOR  MUSCLE SPASMS   MULTI-VITAMINS Tabs Take 1 tablet by mouth daily.   polyethylene glycol packet Commonly known as:  MIRALAX / GLYCOLAX Take 17 g by mouth daily.   PROVENTIL HFA 108 (90 Base) MCG/ACT inhaler Generic drug:  albuterol INHALE 2 PUFFS INTO LUNGS EVERY 6 HOURS AS NEEDED FOR WHEEZING OR SHORTNESS OF BREATH.   ranitidine 150 MG tablet Commonly known as:  ZANTAC Take 1 tablet (150 mg total) by mouth 2 (two) times daily.   senna 8.6 MG tablet Commonly known as:  SENOKOT Take 1 tablet by mouth daily.   TIVICAY 50 MG tablet Generic drug:  dolutegravir TAKE ONE TABLET BY MOUTH EVERY DAY   VOLTAREN 1 % Gel Generic drug:  diclofenac sodium APPLY 2 GRAMS TOPICALLY FOUR TIMES A DAY            Discharge Care Instructions        Start     Ordered   11/21/16 0000  Discharge patient    Question Answer Comment  Discharge disposition 01-Home or Self Care   Discharge patient date 11/21/2016      11/21/16 [id number]       Donnamae Jude, MD 11/21/2016 12:47 AM

## 2016-11-21 NOTE — MAU Note (Signed)
Social Work called at [phone] @ [phone] to come and evaluate this patient. The patient states she does not have a way back home to [place] and the hospital cab vouchers do not take pts outside of [place].

## 2016-11-21 NOTE — Telephone Encounter (Signed)
Ashley [name] with [facility] program called to say Dr Henreitta Leber name was given by patient to confirm her pregnancy.  Patient presented at [facility] office to apply for services. She told the case manger she was 8 months pregnant with twins.  She did not have any documentation to support pregnancy and gave our office number to [facility].    I informed [name] I did not have permission to speak with her regarding this patient and Dr Linus Salmons was not an obstetrician . Ms Allison Quarry is very concerned and suspects she is not pregnant. She asked the patient to return with documentation or with the children next month to receive services.   Laverle Patter ,RN

## 2016-11-21 NOTE — MAU Provider Note (Signed)
See previous note by Dr Kennon Rounds.  Imaging: Ct Abdomen Pelvis W Contrast  Result Date: 11/21/2016 CLINICAL DATA:  Abdominal distention EXAM: CT ABDOMEN AND PELVIS WITH CONTRAST TECHNIQUE: Multidetector CT imaging of the abdomen and pelvis was performed using the standard protocol following bolus administration of intravenous contrast. CONTRAST:  42mL ISOVUE-300 IOPAMIDOL (ISOVUE-300) INJECTION 61% COMPARISON:  CT abdomen pelvis 05/09/2014 FINDINGS: Lower chest: No pulmonary nodules or pleural effusion. No visible pericardial effusion. Hepatobiliary: Normal hepatic contours and density. No visible biliary dilatation. Status post cholecystectomy. Pancreas: Normal contours without ductal dilatation. No peripancreatic fluid collection. Spleen: Normal. Adrenals/Urinary Tract: --Adrenal glands: Normal. --Right kidney/ureter: No hydronephrosis or perinephric stranding. No nephrolithiasis. No obstructing ureteral stones. --Left kidney/ureter: No hydronephrosis or perinephric stranding. No nephrolithiasis. No obstructing ureteral stones. --Urinary bladder: Unremarkable. Stomach/Bowel: --Stomach/Duodenum: No hiatal hernia or other gastric abnormality. Normal duodenal course and caliber. --Small bowel: No dilatation or inflammation. --Colon: No focal abnormality. --Appendix: Normal. Vascular/Lymphatic: Atherosclerotic calcification is present within the non-aneurysmal abdominal aorta, without hemodynamically significant stenosis. No abdominal or pelvic lymphadenopathy. Reproductive: Normal uterus and ovaries. Musculoskeletal. Multilevel degenerative disc disease and facet arthrosis. No bony spinal canal stenosis. Other: Periumbilical abdominal muscle diastasis. No herniated bowel. IMPRESSION: 1. No acute abnormality of the abdomen or pelvis. 2.  Aortic Atherosclerosis (ICD10-I70.0). Electronically Signed   By: Ulyses Jarred M.D.   On: 11/21/2016 04:03   MDM: CT scan with no abnormal results explaining pt abdominal  symptoms.  Pt with nausea in MAU so treated with Zofran 4 mg ODT x 1 dose.  Pt with no transportation at discharge.  House coverage RN to MAU and taxi voucher discussed but pt lives in [place] so voucher will not cover.  Social work consulted and to see pt in MAU. Pt stable waiting for transportation for discharge.  A: 1. Pseudocyesis   2. Abdominal pain, unspecified abdominal location   3. Schizophrenia, unspecified type ([place])   4. Distended abdomen   5. Degenerative disc disease, lumbar     P: D/C home Pt to follow up with primary care   Fatima Blank, CNM 6:28 AM

## 2016-11-21 NOTE — Progress Notes (Signed)
CSW assessed patient for transportation assistance.  Patient arrived to MAU via EMS and is currently ready for discharge.  Patient reports patient resides in [place] with no family support.  Patient states she does not have transportation home from hospital.  CSW provided patient with taxi cab voucher.    There are no barriers to d/c.  Laurey Arrow, MSW, LCSW Clinical Social Work [phone]

## 2016-11-21 NOTE — MAU Note (Signed)
Radiology Notified that pt is finished drinking contrast and labs are back @ 0220.

## 2016-11-21 NOTE — Telephone Encounter (Signed)
lmtcb

## 2016-11-23 ENCOUNTER — Telehealth: Payer: Self-pay | Admitting: Pediatrics

## 2016-11-23 ENCOUNTER — Encounter (INDEPENDENT_AMBULATORY_CARE_PROVIDER_SITE_OTHER): Payer: Self-pay | Admitting: *Deleted

## 2016-11-23 NOTE — Telephone Encounter (Signed)
Patient back and hip pain is no better it has been worse. Meds have no relief. Pt states something has to be done.

## 2016-11-23 NOTE — Telephone Encounter (Signed)
Pt is having worsening low back pain Pt is taking Ibuprofen and muscle relaxer with no relief Offered appt, pt declined stating she was just seen for this problem Please advise

## 2016-11-24 ENCOUNTER — Telehealth: Payer: Self-pay | Admitting: Pediatrics

## 2016-11-24 NOTE — Telephone Encounter (Signed)
Called back, NA, left message to call back.

## 2016-11-24 NOTE — Telephone Encounter (Signed)
Called back again, NA.

## 2016-11-24 NOTE — Telephone Encounter (Signed)
See other phone message, have not been able to reach pt today

## 2016-11-27 ENCOUNTER — Telehealth: Payer: Self-pay | Admitting: *Deleted

## 2016-11-27 ENCOUNTER — Telehealth: Payer: Self-pay | Admitting: Pediatrics

## 2016-11-27 NOTE — Telephone Encounter (Signed)
If patient is feeling her symptoms are a true emergency, she should go to the ED.  No narcotics will be prescribed.  She will need to follow up with Dr Evette Doffing.  Please inform her if she calls back.

## 2016-11-27 NOTE — Telephone Encounter (Signed)
Left message for patient to call back  

## 2016-11-27 NOTE — Telephone Encounter (Signed)
Patient returning Dr. Autumn Patty phone call.  Informed patient that Dr. Evette Doffing will be out of the office until Monday 10/8.  Patient then proceeds to curse this nurse stating that "Yall need to get in contact with Dr. Evette Doffing immediately, or it is going to be bad that this is an emergency"  Patient states that Dr. Evette Doffing put her on bed rest.  Did not see anything in note that states patient is on bed rest.  Patient also states that she is in pain and that muscle relaxer and ibuprofen or tylenol is not helping.  Patient states that a friend gave her a pain pill and that is the only thing that relieved pain.  Offered patient an appt and she declined.

## 2016-11-28 NOTE — Telephone Encounter (Signed)
Patient is aware you are out of the office until Monday 12/04/16 and this will not be addressed until that time.  She reports at her appointment on 11/21/16, you had instructed her not to lift anything over 5 lbs until you were seen by a GI doc.  She lives in [place], [address], and states they are doing some type of remodeling and have asked her to help move heavy items.  She explained to them she is unable to do this on the advise of her doctor.  She is wanting a note addressed to them that states she is unable to do lift over 5 lbs.  I explained to the patient there was no documentation of this at the visit and this would have to be discussed with you first.  She said that would be fine and she can wait until you come back from vacation.

## 2016-12-04 NOTE — Telephone Encounter (Signed)
Called and spoke with patient. She is most bothered by ongoing nausea in the mornings. Not every morning. She threw up in the morning a couple days ago, otherwise no emesis. Sometimes nausea will bother her throughout the day. Sometimes has stomach pains, not all the time. Her back has been itching a lot, putting alcohol and oil on it, helps in the moment then the itching restarts.  Her appetite has been good, eating about 3 meals a day. Has been eating a lot of tomatoes, never had itching with tomatoes before. Having 1-2 stools every day, no diarrhea. No stool yet today. Back pain continues to bother her some, is able to walk regularly still. Note written to avoid heavy lifting while back pain ongoing per pt request.  She will call back to schedule f/u appt in next 1-2 weeks with me.

## 2016-12-07 ENCOUNTER — Ambulatory Visit: Payer: Self-pay | Admitting: Certified Nurse Midwife

## 2016-12-08 ENCOUNTER — Other Ambulatory Visit: Payer: Self-pay | Admitting: Pediatrics

## 2016-12-08 DIAGNOSIS — M545 Low back pain: Secondary | ICD-10-CM

## 2016-12-08 DIAGNOSIS — J439 Emphysema, unspecified: Secondary | ICD-10-CM

## 2016-12-08 DIAGNOSIS — G8929 Other chronic pain: Secondary | ICD-10-CM

## 2016-12-09 ENCOUNTER — Ambulatory Visit (INDEPENDENT_AMBULATORY_CARE_PROVIDER_SITE_OTHER): Payer: Medicaid Other | Admitting: Physician Assistant

## 2016-12-09 VITALS — BP 110/78 | HR 60 | Temp 97.0°F | Ht <= 58 in | Wt 114.0 lb

## 2016-12-09 DIAGNOSIS — K429 Umbilical hernia without obstruction or gangrene: Secondary | ICD-10-CM | POA: Diagnosis not present

## 2016-12-09 MED ORDER — PROMETHAZINE HCL 25 MG PO TABS: 25.0000 mg | ORAL_TABLET | Freq: Three times a day (TID) | ORAL | 0 refills | Status: DC | PRN

## 2016-12-09 NOTE — Patient Instructions (Signed)
In a few days you may receive a survey in the mail or online from [employer] regarding your visit with us today. Please take a moment to fill this out. Your feedback is very important to our whole office. It can help us better understand your needs as well as improve your experience and satisfaction. Thank you for taking your time to complete it. We care about you.  Rael Tilly, PA-C  

## 2016-12-11 ENCOUNTER — Other Ambulatory Visit: Payer: Self-pay | Admitting: *Deleted

## 2016-12-11 DIAGNOSIS — K429 Umbilical hernia without obstruction or gangrene: Secondary | ICD-10-CM

## 2016-12-11 NOTE — Progress Notes (Signed)
BP 110/78 (BP Location: Right Arm, Patient Position: Sitting, Cuff Size: Small)   Pulse 60   Temp (!) 97 F (36.1 C) (Oral)   Ht 4' 7.5" (1.41 m)   Wt 114 lb (51.7 kg)   BMI 26.02 kg/m    Subjective:    Patient ID: Ashley [name], female    DOB: [birth year], 51 y.o.   MRN: [id number]  HPI: Ashley [name] is a 51 y.o. female presenting on 12/09/2016 for Abdominal Pain (Mainly LLQ abd x 1 month. Hx of constipation but resolved with treatment. Last BM was this morning and states that it was soft and she didn't have to strain but that there was a small amount of bright red blood on toilet paper) and Nausea  Patient is complaining of middle to lower abdominal pain. She's had a lot of nausea associated with it when she bends over it will increase. She had had a previous umbilical hernia that was repaired. Her constipation has improved. She did have a straining bowel movement this morning where she saw some bright red blood. It has not continued. She denies any fever or chills.  Relevant past medical, surgical, family and social history reviewed and updated as indicated. Allergies and medications reviewed and updated.  Past Medical History:  Diagnosis Date  . Ankle fracture   . Anxiety   . Asthma   . Collagen vascular disease ([place])   . Depression   . Diabetes mellitus   . Hepatitis C   . Hernia   . HIV (human immunodeficiency virus infection) ([place])   . HIV (human immunodeficiency virus infection) ([place])   . Hypertension   . Pinched nerve   . Scoliosis   . TB (tuberculosis)     Past Surgical History:  Procedure Laterality Date  . CESAREAN SECTION    . CHOLECYSTECTOMY    . HERNIA REPAIR    . LIVER BIOPSY      Review of Systems  Constitutional: Negative.   HENT: Negative.   Eyes: Negative.   Respiratory: Negative.   Gastrointestinal: Positive for abdominal distention, abdominal pain and nausea. Negative for rectal pain and vomiting.  Genitourinary: Negative.     Allergies  as of 12/09/2016      Reactions   Aspirin Other (See Comments)   Liver problems      Medication List       Accurate as of 12/09/16 11:59 PM. Always use your most recent med list.          acetaminophen 325 MG tablet Commonly known as:  TYLENOL Take 650 mg by mouth every 6 (six) hours as needed for mild pain. Reported on 06/16/2015   amLODipine 10 MG tablet Commonly known as:  NORVASC Take 10 mg by mouth daily.   Black Cohosh 40 MG Caps Take by mouth.   carbamide peroxide 6.5 % OTIC solution Commonly known as:  DEBROX Place 5 drops into both ears 2 (two) times daily.   cetirizine 10 MG tablet Commonly known as:  ZYRTEC Take 1 tablet (10 mg total) by mouth daily.   cloNIDine 0.2 MG tablet Commonly known as:  CATAPRES Take 1 tablet (0.2 mg total) by mouth 2 (two) times daily.   DESCOVY 200-25 MG tablet Generic drug:  emtricitabine-tenofovir AF TAKE 1 TABLET BY MOUTH DAILY   fexofenadine 180 MG tablet Commonly known as:  ALLEGRA Take 180 mg by mouth daily.   FLUoxetine 20 MG capsule Commonly known as:  PROZAC Take 1 capsule (20  mg total) by mouth daily.   fluticasone 50 MCG/ACT nasal spray Commonly known as:  FLONASE Place 2 sprays into both nostrils daily.   Glecaprevir-Pibrentasvir 100-40 MG Tabs Commonly known as:  MAVYRET Take 3 tablets by mouth daily.   HAIR SKIN AND NAILS FORMULA PO Take by mouth.   lisinopril 40 MG tablet Commonly known as:  PRINIVIL,ZESTRIL Take 40 mg by mouth daily.   megestrol 40 MG tablet Commonly known as:  MEGACE TAKE ONE TABLET BY MOUTH 2 TIMES A DAY   methocarbamol 500 MG tablet Commonly known as:  ROBAXIN TAKE ONE TABLET BY MOUTH EVERY 8 HOURS AS NEEDED FOR MUSCLE SPASMS   MULTI-VITAMINS Tabs Take 1 tablet by mouth daily.   pantoprazole 40 MG tablet Commonly known as:  PROTONIX Take 1 tablet (40 mg total) by mouth daily.   polyethylene glycol packet Commonly known as:  MIRALAX / GLYCOLAX Take 17 g by mouth  daily.   promethazine 25 MG tablet Commonly known as:  PHENERGAN Take 1 tablet (25 mg total) by mouth every 8 (eight) hours as needed for nausea or vomiting.   PROVENTIL HFA 108 (90 Base) MCG/ACT inhaler Generic drug:  albuterol INHALE 2 PUFFS INTO LUNGS EVERY 6 HOURS AS NEEDED FOR WHEEZING OR SHORTNESS OF BREATH.   senna 8.6 MG tablet Commonly known as:  SENOKOT Take 1 tablet by mouth daily.   TIVICAY 50 MG tablet Generic drug:  dolutegravir TAKE ONE TABLET BY MOUTH EVERY DAY   VOLTAREN 1 % Gel Generic drug:  diclofenac sodium APPLY 2 GRAMS TOPICALLY FOUR TIMES A DAY          Objective:    BP 110/78 (BP Location: Right Arm, Patient Position: Sitting, Cuff Size: Small)   Pulse 60   Temp (!) 97 F (36.1 C) (Oral)   Ht 4' 7.5" (1.41 m)   Wt 114 lb (51.7 kg)   BMI 26.02 kg/m   Allergies  Allergen Reactions  . Aspirin Other (See Comments)    Liver problems    Physical Exam  Constitutional: She is oriented to person, place, and time. She appears well-developed and well-nourished.  HENT:  Head: Normocephalic and atraumatic.  Eyes: Pupils are equal, round, and reactive to light. Conjunctivae and EOM are normal.  Cardiovascular: Normal rate, regular rhythm, normal heart sounds and intact distal pulses.   Pulmonary/Chest: Effort normal and breath sounds normal.  Abdominal: Soft. Bowel sounds are normal. There is tenderness in the right lower quadrant, periumbilical area, suprapubic area and left lower quadrant. There is no CVA tenderness. A hernia is present. Hernia confirmed positive in the ventral area.    Ventral hernia superior to umbilicus. Extremely noticeable when patient reclined on table. When I asked her to sit back up contracting muscles became evident still.  Neurological: She is alert and oriented to person, place, and time. She has normal reflexes.  Skin: Skin is warm and dry. No rash noted.  Psychiatric: She has a normal mood and affect. Her behavior is  normal. Judgment and thought content normal.    Results for orders placed or performed during the hospital encounter of 11/20/16  Urinalysis, Routine w reflex microscopic  Result Value Ref Range   Color, Urine YELLOW YELLOW   APPearance HAZY (A) CLEAR   Specific Gravity, Urine 1.018 1.005 - 1.030   pH 7.0 5.0 - 8.0   Glucose, UA NEGATIVE NEGATIVE mg/dL   Hgb urine dipstick NEGATIVE NEGATIVE   Bilirubin Urine NEGATIVE NEGATIVE   Ketones,  ur NEGATIVE NEGATIVE mg/dL   Protein, ur NEGATIVE NEGATIVE mg/dL   Nitrite NEGATIVE NEGATIVE   Leukocytes, UA NEGATIVE NEGATIVE  CBC with Differential/Platelet  Result Value Ref Range   WBC 4.6 4.0 - 10.5 K/uL   RBC 3.46 (L) 3.87 - 5.11 MIL/uL   Hemoglobin 11.8 (L) 12.0 - 15.0 g/dL   HCT 33.1 (L) 36.0 - 46.0 %   MCV 95.7 78.0 - 100.0 fL   MCH 34.1 (H) 26.0 - 34.0 pg   MCHC 35.6 30.0 - 36.0 g/dL   RDW 13.8 11.5 - 15.5 %   Platelets 147 (L) 150 - 400 K/uL   Neutrophils Relative % 47 %   Neutro Abs 2.2 1.7 - 7.7 K/uL   Lymphocytes Relative 40 %   Lymphs Abs 1.9 0.7 - 4.0 K/uL   Monocytes Relative 10 %   Monocytes Absolute 0.5 0.1 - 1.0 K/uL   Eosinophils Relative 3 %   Eosinophils Absolute 0.1 0.0 - 0.7 K/uL   Basophils Relative 0 %   Basophils Absolute 0.0 0.0 - 0.1 K/uL  Comprehensive metabolic panel  Result Value Ref Range   Sodium 141 135 - 145 mmol/L   Potassium 3.5 3.5 - 5.1 mmol/L   Chloride 106 101 - 111 mmol/L   CO2 27 22 - 32 mmol/L   Glucose, Bld 101 (H) 65 - 99 mg/dL   BUN 7 6 - 20 mg/dL   Creatinine, Ser 1.09 (H) 0.44 - 1.00 mg/dL   Calcium 9.3 8.9 - 10.3 mg/dL   Total Protein 7.3 6.5 - 8.1 g/dL   Albumin 4.3 3.5 - 5.0 g/dL   AST 73 (H) 15 - 41 U/L   ALT 77 (H) 14 - 54 U/L   Alkaline Phosphatase 66 38 - 126 U/L   Total Bilirubin 0.8 0.3 - 1.2 mg/dL   GFR calc non Af Amer 57 (L) >60 mL/min   GFR calc Af Amer >60 >60 mL/min   Anion gap 8 5 - 15  Pregnancy, urine POC  Result Value Ref Range   Preg Test, Ur NEGATIVE  NEGATIVE      Assessment & Plan:   1. Umbilical hernia without obstruction and without gangrene - promethazine (PHENERGAN) 25 MG tablet; Take 1 tablet (25 mg total) by mouth every 8 (eight) hours as needed for nausea or vomiting.  Dispense: 30 tablet; Refill: 0    Current Outpatient Prescriptions:  .  acetaminophen (TYLENOL) 325 MG tablet, Take 650 mg by mouth every 6 (six) hours as needed for mild pain. Reported on 06/16/2015, Disp: , Rfl:  .  amLODipine (NORVASC) 10 MG tablet, Take 10 mg by mouth daily., Disp: , Rfl:  .  Black Cohosh 40 MG CAPS, Take by mouth., Disp: , Rfl:  .  cetirizine (ZYRTEC) 10 MG tablet, Take 1 tablet (10 mg total) by mouth daily., Disp: 30 tablet, Rfl: 11 .  cloNIDine (CATAPRES) 0.2 MG tablet, Take 1 tablet (0.2 mg total) by mouth 2 (two) times daily., Disp: , Rfl: 0 .  DESCOVY 200-25 MG tablet, TAKE 1 TABLET BY MOUTH DAILY, Disp: 30 tablet, Rfl: 3 .  fexofenadine (ALLEGRA) 180 MG tablet, Take 180 mg by mouth daily., Disp: , Rfl:  .  FLUoxetine (PROZAC) 20 MG capsule, Take 1 capsule (20 mg total) by mouth daily., Disp: 90 capsule, Rfl: 1 .  fluticasone (FLONASE) 50 MCG/ACT nasal spray, Place 2 sprays into both nostrils daily., Disp: , Rfl:  .  Glecaprevir-Pibrentasvir (MAVYRET) 100-40 MG TABS, Take 3  tablets by mouth daily., Disp: 84 tablet, Rfl: 1 .  lisinopril (PRINIVIL,ZESTRIL) 40 MG tablet, Take 40 mg by mouth daily., Disp: , Rfl:  .  megestrol (MEGACE) 40 MG tablet, TAKE ONE TABLET BY MOUTH 2 TIMES A DAY, Disp: 60 tablet, Rfl: 0 .  methocarbamol (ROBAXIN) 500 MG tablet, TAKE ONE TABLET BY MOUTH EVERY 8 HOURS AS NEEDED FOR MUSCLE SPASMS, Disp: 30 tablet, Rfl: 0 .  Multiple Vitamin (MULTI-VITAMINS) TABS, Take 1 tablet by mouth daily., Disp: 90 tablet, Rfl: 3 .  Multiple Vitamins-Minerals (HAIR SKIN AND NAILS FORMULA PO), Take by mouth., Disp: , Rfl:  .  pantoprazole (PROTONIX) 40 MG tablet, Take 1 tablet (40 mg total) by mouth daily., Disp: 30 tablet, Rfl: 6 .   polyethylene glycol (MIRALAX / GLYCOLAX) packet, Take 17 g by mouth daily., Disp: 90 each, Rfl: 3 .  PROVENTIL HFA 108 (90 Base) MCG/ACT inhaler, INHALE 2 PUFFS INTO LUNGS EVERY 6 HOURS AS NEEDED FOR WHEEZING OR SHORTNESS OF BREATH., Disp: 6.7 g, Rfl: 0 .  senna (SENOKOT) 8.6 MG tablet, Take 1 tablet by mouth daily., Disp: , Rfl:  .  TIVICAY 50 MG tablet, TAKE ONE TABLET BY MOUTH EVERY DAY, Disp: 30 tablet, Rfl: 3 .  VOLTAREN 1 % GEL, APPLY 2 GRAMS TOPICALLY FOUR TIMES A DAY, Disp: 100 g, Rfl: 2 .  carbamide peroxide (DEBROX) 6.5 % OTIC solution, Place 5 drops into both ears 2 (two) times daily. (Patient not taking: Reported on 12/09/2016), Disp: 15 mL, Rfl: 2 .  promethazine (PHENERGAN) 25 MG tablet, Take 1 tablet (25 mg total) by mouth every 8 (eight) hours as needed for nausea or vomiting., Disp: 30 tablet, Rfl: 0 Continue all other maintenance medications as listed above.  Follow up plan: Return if symptoms worsen or fail to improve.  Educational handout given for [place] PA-C [place] [address]  [place], [place] [address] [phone]   12/11/2016, 9:54 AM

## 2016-12-12 ENCOUNTER — Ambulatory Visit: Payer: Self-pay | Admitting: Certified Nurse Midwife

## 2016-12-13 ENCOUNTER — Other Ambulatory Visit: Payer: Self-pay | Admitting: Pediatrics

## 2016-12-13 ENCOUNTER — Other Ambulatory Visit: Payer: Self-pay | Admitting: Internal Medicine

## 2016-12-14 ENCOUNTER — Ambulatory Visit (INDEPENDENT_AMBULATORY_CARE_PROVIDER_SITE_OTHER): Payer: Medicaid Other | Admitting: General Surgery

## 2016-12-14 ENCOUNTER — Encounter: Payer: Self-pay | Admitting: General Surgery

## 2016-12-14 VITALS — BP 101/59 | HR 59 | Temp 97.8°F | Resp 18 | Ht <= 58 in | Wt 117.0 lb

## 2016-12-14 DIAGNOSIS — M6208 Separation of muscle (nontraumatic), other site: Secondary | ICD-10-CM | POA: Diagnosis not present

## 2016-12-14 DIAGNOSIS — K432 Incisional hernia without obstruction or gangrene: Secondary | ICD-10-CM

## 2016-12-14 NOTE — Progress Notes (Signed)
[facility] History and Physical  Reason for Referral: Abdominal wall hernia Referring Physician: Dr. Sherlean Foot is a 51 y.o. female.  HPI: Ms. Alcantar is a 51 yo with multiple medical issues including HIV, Hep C, with reported good counts and plans for potential Hep C treatment in the near future.  The has a history of a ventral hernia s/p primary repair with mesh 2006 with Dr. Geronimo Boot.  She has been having abdominal pain at the mesh site, and has seen her PCP regaining this multiple times. She was referred for evaluation.  She underwent a CT 10/2016 that demonstrates no obvious hernia defect, some degree of diastasis with obvious mesh implant and inflammation / stranding around the mesh.  She reports no nausea/vomiting, but does have generalized pain. She has daily BMs but has to take medications to have these BMs. She has history of constipation and bleeding from her rectum.  She has never had a colonoscopy per her report.  She also has had a liver biopsy in 2002 and confirmed Hepatitis C and fibrinous. There is no reports of cirrhosis in her chart. She says she had a laparoscopic cholecystectomy but this is not in the EHR. She said this was years ago.   Past Medical History:  Diagnosis Date  . Ankle fracture   . Anxiety   . Asthma   . Collagen vascular disease ([place])   . Depression   . Diabetes mellitus   . Hepatitis C   . Hernia   . HIV (human immunodeficiency virus infection) ([place])   . HIV (human immunodeficiency virus infection) ([place])   . Hypertension   . Pinched nerve   . Scoliosis   . TB (tuberculosis)     Past Surgical History:  Procedure Laterality Date  . CESAREAN SECTION    . CHOLECYSTECTOMY    . HERNIA REPAIR    . LIVER BIOPSY      Family History  Problem Relation Age of Onset  . Diabetes Mother   . Hypertension Mother   . Stroke Mother        died Oct 20, 2013 . Cancer Sister   . Cancer Maternal Aunt     Social History   Substance Use Topics  . Smoking status: Current Every Day Smoker    Packs/day: 1.00    Years: 35.00    Types: Cigarettes  . Smokeless tobacco: Never Used     Comment: 1 pack every 2-3 days  . Alcohol use No    Medications: I have reviewed the patient's current medications. Allergies as of 12/14/2016      Reactions   Aspirin Other (See Comments)   Liver problems      Medication List       Accurate as of 12/14/16  4:19 PM. Always use your most recent med list.          acetaminophen 325 MG tablet Commonly known as:  TYLENOL Take 650 mg by mouth every 6 (six) hours as needed for mild pain. Reported on 06/16/2015   amLODipine 10 MG tablet Commonly known as:  NORVASC Take 10 mg by mouth daily.   Black Cohosh 40 MG Caps Take by mouth.   carbamide peroxide 6.5 % OTIC solution Commonly known as:  DEBROX Place 5 drops into both ears 2 (two) times daily.   cetirizine 10 MG tablet Commonly known as:  ZYRTEC Take 1 tablet (10 mg total) by mouth daily.   cloNIDine 0.2 MG  tablet Commonly known as:  CATAPRES Take 1 tablet (0.2 mg total) by mouth 2 (two) times daily.   DESCOVY 200-25 MG tablet Generic drug:  emtricitabine-tenofovir AF TAKE 1 TABLET BY MOUTH DAILY   fexofenadine 180 MG tablet Commonly known as:  ALLEGRA Take 180 mg by mouth daily.   FLUoxetine 20 MG capsule Commonly known as:  PROZAC Take 1 capsule (20 mg total) by mouth daily.   fluticasone 50 MCG/ACT nasal spray Commonly known as:  FLONASE Place 2 sprays into both nostrils daily.   Glecaprevir-Pibrentasvir 100-40 MG Tabs Commonly known as:  MAVYRET Take 3 tablets by mouth daily.   HAIR SKIN AND NAILS FORMULA PO Take by mouth.   lisinopril 40 MG tablet Commonly known as:  PRINIVIL,ZESTRIL Take 40 mg by mouth daily.   megestrol 40 MG tablet Commonly known as:  MEGACE TAKE ONE TABLET BY MOUTH 2 TIMES A DAY   methocarbamol 500 MG tablet Commonly known as:  ROBAXIN TAKE ONE TABLET BY  MOUTH EVERY 8 HOURS AS NEEDED FOR MUSCLE SPASMS   MULTI-VITAMINS Tabs Take 1 tablet by mouth daily.   pantoprazole 40 MG tablet Commonly known as:  PROTONIX Take 1 tablet (40 mg total) by mouth daily.   polyethylene glycol packet Commonly known as:  MIRALAX / GLYCOLAX Take 17 g by mouth daily.   promethazine 25 MG tablet Commonly known as:  PHENERGAN Take 1 tablet (25 mg total) by mouth every 8 (eight) hours as needed for nausea or vomiting.   PROVENTIL HFA 108 (90 Base) MCG/ACT inhaler Generic drug:  albuterol INHALE 2 PUFFS INTO LUNGS EVERY 6 HOURS AS NEEDED FOR WHEEZING OR SHORTNESS OF BREATH.   senna 8.6 MG tablet Commonly known as:  SENOKOT Take 1 tablet by mouth daily.   TIVICAY 50 MG tablet Generic drug:  dolutegravir TAKE ONE TABLET BY MOUTH EVERY DAY   VOLTAREN 1 % Gel Generic drug:  diclofenac sodium APPLY 2 GRAMS TOPICALLY FOUR TIMES A DAY        ROS:  A comprehensive review of systems was negative except for: Gastrointestinal: positive for abdominal pain, constipation and melena Allergic/Immunologic: positive for HIV  Blood pressure (!) 101/59, pulse (!) 59, temperature 97.8 F (36.6 C), resp. rate 18, height 4\' 7"  (1.397 m), weight 117 lb (53.1 kg). Physical Exam  Constitutional: She is oriented to person, place, and time and well-developed, well-nourished, and in no distress.  HENT:  Head: Normocephalic.  Eyes: Pupils are equal, round, and reactive to light.  Cardiovascular: Normal rate and regular rhythm.   Pulmonary/Chest: Effort normal and breath sounds normal.  Abdominal: Soft. She exhibits no distension. There is tenderness. There is no rebound and no guarding.  Midline scar with palpable mesh, no obvious hernia defect but bugling of the entire midline / mesh prothesis, suture knots palpabled, no drainage or erythema, thin skin overlying  Musculoskeletal: Normal range of motion.  Neurological: She is oriented to person, place, and time. GCS  score is 15.  Skin: Skin is warm and dry.  Psychiatric: Memory, affect and judgment normal.    Results: Personally reviewed CT 10/2016- midline diastasis, inflammation and stranding around midline mesh, thin skin overlying area, no bowel hernia/ no omental hernia  FINDINGS: Lower chest: No pulmonary nodules or pleural effusion. No visible pericardial effusion. Hepatobiliary: Normal hepatic contours and density. No visible biliary dilatation. Status post cholecystectomy. Pancreas: Normal contours without ductal dilatation. No peripancreatic fluid collection. Spleen: Normal. Adrenals/Urinary Tract: --Adrenal glands: Normal. --Right kidney/ureter: No  hydronephrosis or perinephric stranding. No nephrolithiasis. No obstructing ureteral stones. --Left kidney/ureter: No hydronephrosis or perinephric stranding. No nephrolithiasis. No obstructing ureteral stones. --Urinary bladder: Unremarkable. Stomach/Bowel: --Stomach/Duodenum: No hiatal hernia or other gastric abnormality. Normal duodenal course and caliber. --Small bowel: No dilatation or inflammation. --Colon: No focal abnormality. --Appendix: Normal. Vascular/Lymphatic: Atherosclerotic calcification is present within the non-aneurysmal abdominal aorta, without hemodynamically significant stenosis. No abdominal or pelvic lymphadenopathy. Reproductive: Normal uterus and ovaries. Musculoskeletal. Multilevel degenerative disc disease and facet arthrosis. No bony spinal canal stenosis. Other: Periumbilical abdominal muscle diastasis. No herniated bowel. IMPRESSION: 1. No acute abnormality of the abdomen or pelvis. 2.  Aortic Atherosclerosis (ICD10-I70.0).  Assessment & Plan:  OCTOBER PEERY is a 51 y.o. female with abdominal pain along her midline incision and CT findings of mesh with stranding and some inflammation and thin layer of skin covering the mesh. She does not have a mesh erosion but at this time the mesh is very superficial  and causing discomfort.  She has no defect that will cause herniation of bowel but there is concern that the mesh will erode in the future and could cause further problems. Reviewing the operative report. Proceed mesh was used and some of this recalled based on Internet search.  -Patient needs colonoscopy prior to any decisions regarding potential surgery  -Want to discuss patient and CT with partner, given the symptoms, need to weigh the risk and benefits of a mesh explantation and repair of the hernia versus no operative management given no true hernia defect  -Follow up after colonoscopy, patient sees PCP next week, advised her to get referral for colonoscopy   All questions were answered to the satisfaction of the patient.    Virl Cagey 12/14/2016, 4:19 PM

## 2016-12-14 NOTE — Patient Instructions (Addendum)
Need to get colonoscopy prior to any surgeries for hernia repair/ mesh removal.  Please get PCP to refer for colonoscopy.   Abdominal Mesh Removal Abdominal mesh removal is a surgery to take out a sheet of material (mesh) that was previously put in the abdomen. Surgical mesh may have been used during a hernia repair or pelvic surgeries. You may need to have this surgery if you have any of these problems:  An infection.  A reaction to the mesh.  Mesh moving around in the abdomen.  Severe pain.  New problems with the abdomen.  The mesh falling apart, breaking up, or decaying.  Tell a health care provider about:  Any allergies you have.  All medicines you are taking, including vitamins, herbs, eye drops, creams, and over-the-counter medicines.  Any problems you or family members have had with anesthetic medicines.  Any blood disorders you have.  Any surgeries you have had.  Any medical conditions you have.  Whether you are pregnant or may be pregnant. What are the risks? Generally, this is a safe procedure. However, problems may occur, including:  Infection.  Bleeding.  Allergic reactions to medicines or dyes.  Damage to other structures or organs.  What happens before the procedure? Staying hydrated Follow instructions from your health care provider about hydration, which may include:  Up to 2 hours before the procedure - you may continue to drink clear liquids, such as water, clear fruit juice, black coffee, and plain tea.  Eating and drinking restrictions Follow instructions from your health care provider about eating and drinking, which may include:  8 hours before the procedure - stop eating heavy meals or foods such as meat, fried foods, or fatty foods.  6 hours before the procedure - stop eating light meals or foods, such as toast or cereal.  6 hours before the procedure - stop drinking milk or drinks that contain milk.  2 hours before the procedure -  stop drinking clear liquids.  Medicine  Ask your health care provider about: ? Changing or stopping your regular medicines. This is especially important if you are taking diabetes medicines or blood thinners. ? Taking medicines such as aspirin and ibuprofen. These medicines can thin your blood. Do not take these medicines before your procedure if your health care provider instructs you not to.  If you were prescribed an antibiotic medicine to help prevent infection, take the antibiotic as told by your health care provider. General instructions  Do not use any products that contain nicotine or tobacco, such as cigarettes and e-cigarettes. If you need help quitting, ask your health care provider.  Ask your health care provider how your surgical site will be marked or identified.  You may be asked to shower with a germ-killing soap.  Plan to have someone take you home from the hospital or clinic. What happens during the procedure?  To lower your risk of infection: ? Your health care team will wash or sanitize their hands. ? Your skin will be washed with soap. ? Hair may be removed from the surgical area.  An IV will be inserted into one of your veins.  You will be given one or more of the following: ? A medicine to help you relax (sedative). ? A medicine to make you fall asleep (general anesthetic).  Incisions will be made in your abdomen.  The mesh will be cut out and removed from your abdomen.  Small tubes may be placed to drain fluid from your body.  A small, thin tube (catheter) may be placed to drain urine.  The incisions will be closed with stitches (sutures), skin glue, or adhesive strips.  A bandage (dressing) will be placed over the incision. The procedure may vary among health care providers and hospitals. What happens after the procedure?  Your blood pressure, heart rate, breathing rate, and blood oxygen level will be monitored until the medicines you were given  have worn off.  You may be given medicine for pain.  You may have to stay in the hospital after the procedure. Ask your health care provider how long you will stay.  If you had a urinary catheter inserted at the time of surgery, it will be removed before you go home.  You will be encouraged to walk as soon as possible. You will also use a device (incentive spirometer) or do breathing exercises to keep your lungs clear.  You may have to wear compression stockings. These stockings help to prevent blood clots and reduce swelling in your legs.  You may have a liquid diet at first. You will most likely return to your usual diet the day after surgery. Summary  Abdominal mesh removal is a type of surgery to take out a sheet of material (mesh) that was previously put in the abdomen.  You may need to take an antibiotic medicine before the procedure. Take it as told by your health care provider.  You may have to stay in the hospital after abdominal mesh removal. Ask your health care provider how long you will stay. This information is not intended to replace advice given to you by your health care provider. Make sure you discuss any questions you have with your health care provider. Document Released: 03/29/2016 Document Revised: 03/29/2016 Document Reviewed: 03/29/2016 Elsevier Interactive Patient Education  [employer].

## 2016-12-15 ENCOUNTER — Telehealth: Payer: Self-pay | Admitting: Pediatrics

## 2016-12-15 NOTE — Telephone Encounter (Signed)
This is a patient of Dr. Evette Doffing.

## 2016-12-15 NOTE — Telephone Encounter (Signed)
She needs to call Dr. Olevia Perches office, they weren't able to contact her so sent a letter to have her clal back to schedule appt.  Phone: [phone]

## 2016-12-15 NOTE — Telephone Encounter (Signed)
Number given to patient.

## 2016-12-19 ENCOUNTER — Ambulatory Visit: Payer: Medicaid Other | Admitting: Pediatrics

## 2016-12-19 NOTE — Addendum Note (Signed)
Addended by: Lorne Skeens D on: 12/19/2016 04:30 PM   Modules accepted: Orders

## 2016-12-20 ENCOUNTER — Ambulatory Visit: Payer: Self-pay | Admitting: Certified Nurse Midwife

## 2016-12-21 ENCOUNTER — Encounter (INDEPENDENT_AMBULATORY_CARE_PROVIDER_SITE_OTHER): Payer: Self-pay | Admitting: Internal Medicine

## 2016-12-28 ENCOUNTER — Ambulatory Visit: Payer: Medicaid Other | Admitting: Pediatrics

## 2016-12-28 ENCOUNTER — Ambulatory Visit: Payer: Self-pay | Admitting: Certified Nurse Midwife

## 2017-01-01 ENCOUNTER — Ambulatory Visit: Payer: Medicaid Other | Admitting: Pediatrics

## 2017-01-04 ENCOUNTER — Other Ambulatory Visit: Payer: Self-pay | Admitting: Pediatrics

## 2017-01-04 DIAGNOSIS — J439 Emphysema, unspecified: Secondary | ICD-10-CM

## 2017-01-04 DIAGNOSIS — M545 Low back pain: Principal | ICD-10-CM

## 2017-01-04 DIAGNOSIS — G8929 Other chronic pain: Secondary | ICD-10-CM

## 2017-01-08 ENCOUNTER — Encounter (INDEPENDENT_AMBULATORY_CARE_PROVIDER_SITE_OTHER): Payer: Self-pay | Admitting: Internal Medicine

## 2017-01-08 ENCOUNTER — Ambulatory Visit: Payer: Medicaid Other | Admitting: Pediatrics

## 2017-01-08 ENCOUNTER — Ambulatory Visit (INDEPENDENT_AMBULATORY_CARE_PROVIDER_SITE_OTHER): Payer: Self-pay | Admitting: Internal Medicine

## 2017-01-09 ENCOUNTER — Ambulatory Visit: Payer: Self-pay | Admitting: Certified Nurse Midwife

## 2017-01-10 ENCOUNTER — Other Ambulatory Visit: Payer: Self-pay

## 2017-01-10 ENCOUNTER — Telehealth: Payer: Self-pay | Admitting: Pediatrics

## 2017-01-10 ENCOUNTER — Ambulatory Visit: Payer: Medicaid Other | Admitting: Pediatrics

## 2017-01-10 DIAGNOSIS — N898 Other specified noninflammatory disorders of vagina: Secondary | ICD-10-CM

## 2017-01-11 ENCOUNTER — Other Ambulatory Visit: Payer: Self-pay | Admitting: Pediatrics

## 2017-01-11 ENCOUNTER — Other Ambulatory Visit: Payer: Self-pay | Admitting: Internal Medicine

## 2017-01-11 MED ORDER — MONISTAT 7 COMPLETE THERAPY 100-2 MG-% VA KIT: 1.0000 "application " | PACK | Freq: Once | VAGINAL | 2 refills | Status: AC

## 2017-01-11 NOTE — Telephone Encounter (Signed)
Called and d/w pt, she is going to call back about an appt, either today or next week.

## 2017-01-15 ENCOUNTER — Ambulatory Visit (INDEPENDENT_AMBULATORY_CARE_PROVIDER_SITE_OTHER): Payer: Medicaid Other | Admitting: Internal Medicine

## 2017-01-15 ENCOUNTER — Telehealth (INDEPENDENT_AMBULATORY_CARE_PROVIDER_SITE_OTHER): Payer: Self-pay | Admitting: *Deleted

## 2017-01-15 ENCOUNTER — Encounter (INDEPENDENT_AMBULATORY_CARE_PROVIDER_SITE_OTHER): Payer: Self-pay | Admitting: *Deleted

## 2017-01-15 ENCOUNTER — Encounter (INDEPENDENT_AMBULATORY_CARE_PROVIDER_SITE_OTHER): Payer: Self-pay | Admitting: Internal Medicine

## 2017-01-15 VITALS — BP 98/62 | HR 64 | Temp 98.0°F | Ht <= 58 in | Wt 113.0 lb

## 2017-01-15 DIAGNOSIS — Z1211 Encounter for screening for malignant neoplasm of colon: Secondary | ICD-10-CM | POA: Insufficient documentation

## 2017-01-15 MED ORDER — PEG 3350-KCL-NA BICARB-NACL 420 G PO SOLR: 4000.0000 mL | Freq: Once | ORAL | 0 refills | Status: AC

## 2017-01-15 NOTE — Telephone Encounter (Signed)
Patient needs trilyte 

## 2017-01-15 NOTE — Progress Notes (Signed)
Subjective:    Patient ID: Ashley [name], female    DOB: 21-Oct-1965, 51 y.o.   MRN: [id number]  HPI Referred by Dr. Evette Doffing for colonoscopy.   Hx of HIV and Hepatitis C. Hep C will be treated with Mavyret by Dr. Scharlene Gloss with [facility].  Her last colonoscopy was at couple of years ago at [facility]. ( I could not find this record) Redently seen by Dr. Virl Cagey for possible ventral hernia repair. Dr. Constance Haw would like a colonoscopy before she makes any decisions regarding potential surgery.  Her appetite is good. No weight loss. Has a BM daily. No melena or BRRB. She thinks her father had colon cancer.  Has never been treated for Hepatitis C.  Underwent a repair of ventral hernia with Mesh in 2006. (Incarcerated epigastric and ventral hernias) by Dr. Kathrin Penner.  CBC    Component Value Date/Time   WBC 4.6 11/21/2016 0102   RBC 3.46 (L) 11/21/2016 0102   HGB 11.8 (L) 11/21/2016 0102   HGB 10.9 (L) 10/16/2013 0553   HCT 33.1 (L) 11/21/2016 0102   HCT 31.9 (L) 10/16/2013 0553   PLT 147 (L) 11/21/2016 0102   PLT 78 (L) 10/16/2013 0553   MCV 95.7 11/21/2016 0102   MCV 94 10/16/2013 0553   MCH 34.1 (H) 11/21/2016 0102   MCHC 35.6 11/21/2016 0102   RDW 13.8 11/21/2016 0102   RDW 14.4 10/16/2013 0553   LYMPHSABS 1.9 11/21/2016 0102   LYMPHSABS 0.9 (L) 10/16/2013 0553   MONOABS 0.5 11/21/2016 0102   MONOABS 0.8 10/16/2013 0553   EOSABS 0.1 11/21/2016 0102   EOSABS 0.0 10/16/2013 0553   BASOSABS 0.0 11/21/2016 0102   BASOSABS 0.0 10/16/2013 0553     Review of Systems Past Medical History:  Diagnosis Date  . Ankle fracture   . Anxiety   . Asthma   . Collagen vascular disease ([place])   . Depression   . Diabetes mellitus   . Hepatitis C   . Hernia   . HIV (human immunodeficiency virus infection) ([place])   . HIV (human immunodeficiency virus infection) ([place])   . Hypertension   . Pinched nerve   . Scoliosis   . TB  (tuberculosis)     Past Surgical History:  Procedure Laterality Date  . CESAREAN SECTION    . CHOLECYSTECTOMY    . HERNIA REPAIR    . LIVER BIOPSY      Allergies  Allergen Reactions  . Aspirin Other (See Comments)    Liver problems    Current Outpatient Medications on File Prior to Visit  Medication Sig Dispense Refill  . acetaminophen (TYLENOL) 325 MG tablet Take 650 mg by mouth every 6 (six) hours as needed for mild pain. Reported on 06/16/2015    . amLODipine (NORVASC) 10 MG tablet Take 10 mg by mouth daily.    . Black Cohosh 40 MG CAPS Take by mouth.    . cetirizine (ZYRTEC) 10 MG tablet Take 1 tablet (10 mg total) by mouth daily. 30 tablet 11  . cloNIDine (CATAPRES) 0.2 MG tablet Take 1 tablet (0.2 mg total) by mouth 2 (two) times daily.  0  . DESCOVY 200-25 MG tablet TAKE 1 TABLET BY MOUTH DAILY 30 tablet 3  . fexofenadine (ALLEGRA) 180 MG tablet Take 180 mg by mouth daily.    Marland Kitchen FLUoxetine (PROZAC) 20 MG capsule Take 1 capsule (20 mg total) by mouth daily. 90 capsule 1  .  lisinopril (PRINIVIL,ZESTRIL) 40 MG tablet Take 40 mg by mouth daily.    . megestrol (MEGACE) 40 MG tablet TAKE ONE TABLET BY MOUTH 2 TIMES A DAY 60 tablet 2  . methocarbamol (ROBAXIN) 500 MG tablet TAKE ONE TABLET BY MOUTH EVERY 8 HOURS AS NEEDED FOR MUSCLE SPASMS 30 tablet 0  . Multiple Vitamin (MULTI-VITAMINS) TABS Take 1 tablet by mouth daily. 90 tablet 3  . Multiple Vitamins-Minerals (HAIR SKIN AND NAILS FORMULA PO) Take by mouth.    . pantoprazole (PROTONIX) 40 MG tablet Take 1 tablet (40 mg total) by mouth daily. 30 tablet 6  . polyethylene glycol (MIRALAX / GLYCOLAX) packet Take 17 g by mouth daily. 90 each 3  . PROVENTIL HFA 108 (90 Base) MCG/ACT inhaler INHALE 2 PUFFS INTO THE LUNGS EVERY 6 HOURS AS NEEDED FOR WHEEZING ORSHORTNESS OF BREATH. 6.7 g 4  . ranitidine (ZANTAC) 150 MG tablet Take 150 mg 2 (two) times daily by mouth.    . senna (SENOKOT) 8.6 MG TABS tablet TAKE ONE TABLET BY MOUTH EVERY  DAY AS NEEDED FOR CONSTIPATION 30 tablet 0  . TIVICAY 50 MG tablet TAKE ONE TABLET BY MOUTH EVERY DAY 30 tablet 3  . VOLTAREN 1 % GEL APPLY 2 GRAMS TOPICALLY FOUR TIMES A DAY 100 g 2   No current facility-administered medications on file prior to visit.         Objective:   Physical Exam Blood pressure 98/62, pulse 64, temperature 98 F (36.7 C), height 4' 7.5" (1.41 m), weight 113 lb (51.3 kg). Alert and oriented. Skin warm and dry. Oral mucosa is moist.   . Sclera anicteric, conjunctivae is pink. Thyroid not enlarged. No cervical lymphadenopathy. Lungs clear. Heart regular rate and rhythm.  Abdomen is soft. Bowel sounds are positive. No hepatomegaly. No abdominal masses felt. No tenderness.  No edema to lower extremities.  .          Assessment & Plan:  Screening colonoscopy.  The risks of bleeding, perforation and infection were reviewed with patient.

## 2017-01-15 NOTE — Patient Instructions (Signed)
The risks of bleeding, perforation and infection were reviewed with patient.  

## 2017-01-22 ENCOUNTER — Ambulatory Visit: Payer: Medicaid Other | Admitting: Pediatrics

## 2017-01-23 ENCOUNTER — Encounter: Payer: Self-pay | Admitting: Pediatrics

## 2017-01-25 ENCOUNTER — Other Ambulatory Visit: Payer: Self-pay | Admitting: Family Medicine

## 2017-01-25 ENCOUNTER — Ambulatory Visit: Payer: Self-pay | Admitting: Certified Nurse Midwife

## 2017-01-25 ENCOUNTER — Ambulatory Visit: Payer: Self-pay | Admitting: Internal Medicine

## 2017-01-25 DIAGNOSIS — K219 Gastro-esophageal reflux disease without esophagitis: Secondary | ICD-10-CM

## 2017-01-31 ENCOUNTER — Ambulatory Visit: Payer: Medicaid Other | Admitting: Pediatrics

## 2017-01-31 ENCOUNTER — Ambulatory Visit: Payer: Self-pay | Admitting: Certified Nurse Midwife

## 2017-02-07 ENCOUNTER — Other Ambulatory Visit: Payer: Self-pay | Admitting: Pediatrics

## 2017-02-07 DIAGNOSIS — G8929 Other chronic pain: Secondary | ICD-10-CM

## 2017-02-07 DIAGNOSIS — M545 Low back pain, unspecified: Secondary | ICD-10-CM

## 2017-02-10 ENCOUNTER — Encounter: Payer: Self-pay | Admitting: Pediatrics

## 2017-03-07 ENCOUNTER — Other Ambulatory Visit: Payer: Self-pay | Admitting: Pediatrics

## 2017-03-08 ENCOUNTER — Ambulatory Visit (HOSPITAL_COMMUNITY): Admission: RE | Admit: 2017-03-08 | Payer: Medicaid Other | Source: Ambulatory Visit | Admitting: Internal Medicine

## 2017-03-08 ENCOUNTER — Encounter (HOSPITAL_COMMUNITY): Admission: RE | Payer: Self-pay | Source: Ambulatory Visit

## 2017-03-08 SURGERY — COLONOSCOPY
Anesthesia: Moderate Sedation

## 2017-03-20 ENCOUNTER — Other Ambulatory Visit: Payer: Self-pay | Admitting: Internal Medicine

## 2017-03-20 ENCOUNTER — Other Ambulatory Visit: Payer: Self-pay | Admitting: Pediatrics

## 2017-03-20 DIAGNOSIS — F419 Anxiety disorder, unspecified: Secondary | ICD-10-CM

## 2017-03-20 DIAGNOSIS — I10 Essential (primary) hypertension: Secondary | ICD-10-CM

## 2017-03-20 DIAGNOSIS — G8929 Other chronic pain: Secondary | ICD-10-CM

## 2017-03-20 DIAGNOSIS — B20 Human immunodeficiency virus [HIV] disease: Secondary | ICD-10-CM

## 2017-03-20 DIAGNOSIS — M545 Low back pain: Secondary | ICD-10-CM

## 2017-04-23 ENCOUNTER — Other Ambulatory Visit: Payer: Self-pay | Admitting: Internal Medicine

## 2017-04-23 ENCOUNTER — Other Ambulatory Visit: Payer: Self-pay | Admitting: Pediatrics

## 2017-04-23 DIAGNOSIS — G8929 Other chronic pain: Secondary | ICD-10-CM

## 2017-04-23 DIAGNOSIS — M545 Low back pain, unspecified: Secondary | ICD-10-CM

## 2017-04-23 DIAGNOSIS — B20 Human immunodeficiency virus [HIV] disease: Secondary | ICD-10-CM

## 2017-05-21 ENCOUNTER — Other Ambulatory Visit: Payer: Self-pay | Admitting: Pediatrics

## 2017-05-21 DIAGNOSIS — M545 Low back pain, unspecified: Secondary | ICD-10-CM

## 2017-05-21 DIAGNOSIS — G8929 Other chronic pain: Secondary | ICD-10-CM

## 2017-05-24 ENCOUNTER — Telehealth: Payer: Self-pay

## 2017-05-24 NOTE — Telephone Encounter (Signed)
I need more information than this. She has an appt with me 4/1. OK to wait until then to discuss? Most referrals I would need to see her in clinic for anyway

## 2017-05-24 NOTE — Telephone Encounter (Signed)
Wants a referral

## 2017-05-28 ENCOUNTER — Ambulatory Visit: Payer: Medicaid Other | Admitting: Pediatrics

## 2017-05-28 ENCOUNTER — Telehealth: Payer: Self-pay | Admitting: *Deleted

## 2017-05-28 NOTE — Telephone Encounter (Signed)
Attempted to contact patient regarding missed appt today 05/28/17

## 2017-05-29 ENCOUNTER — Other Ambulatory Visit: Payer: Self-pay

## 2017-05-30 ENCOUNTER — Encounter: Payer: Self-pay | Admitting: Pediatrics

## 2017-06-05 ENCOUNTER — Ambulatory Visit: Payer: Medicaid Other | Admitting: Certified Nurse Midwife

## 2017-06-14 ENCOUNTER — Encounter: Payer: Self-pay | Admitting: Internal Medicine

## 2017-06-24 ENCOUNTER — Other Ambulatory Visit: Payer: Self-pay | Admitting: Pediatrics

## 2017-06-24 DIAGNOSIS — G8929 Other chronic pain: Secondary | ICD-10-CM

## 2017-06-24 DIAGNOSIS — J309 Allergic rhinitis, unspecified: Secondary | ICD-10-CM

## 2017-06-24 DIAGNOSIS — K219 Gastro-esophageal reflux disease without esophagitis: Secondary | ICD-10-CM

## 2017-06-24 DIAGNOSIS — J439 Emphysema, unspecified: Secondary | ICD-10-CM

## 2017-06-24 DIAGNOSIS — M545 Low back pain: Principal | ICD-10-CM

## 2017-06-25 ENCOUNTER — Other Ambulatory Visit: Payer: Self-pay | Admitting: Physician Assistant

## 2017-06-25 NOTE — Telephone Encounter (Signed)
Pt aware NTBS 

## 2017-06-25 NOTE — Telephone Encounter (Signed)
Last seen 12/09/16  Dr Evette Doffing

## 2017-07-19 ENCOUNTER — Other Ambulatory Visit: Payer: Self-pay | Admitting: Pediatrics

## 2017-07-19 DIAGNOSIS — M545 Low back pain: Secondary | ICD-10-CM

## 2017-07-19 DIAGNOSIS — I10 Essential (primary) hypertension: Secondary | ICD-10-CM

## 2017-07-19 DIAGNOSIS — G8929 Other chronic pain: Secondary | ICD-10-CM

## 2017-07-19 DIAGNOSIS — J439 Emphysema, unspecified: Secondary | ICD-10-CM

## 2017-08-08 ENCOUNTER — Ambulatory Visit: Payer: Medicaid Other | Admitting: Pediatrics

## 2017-08-08 ENCOUNTER — Encounter: Payer: Self-pay | Admitting: Pediatrics

## 2017-08-08 VITALS — BP 109/76 | HR 70 | Temp 98.1°F | Ht <= 58 in | Wt 108.0 lb

## 2017-08-08 DIAGNOSIS — K219 Gastro-esophageal reflux disease without esophagitis: Secondary | ICD-10-CM | POA: Diagnosis not present

## 2017-08-08 DIAGNOSIS — F339 Major depressive disorder, recurrent, unspecified: Secondary | ICD-10-CM

## 2017-08-08 DIAGNOSIS — G8929 Other chronic pain: Secondary | ICD-10-CM

## 2017-08-08 DIAGNOSIS — M79674 Pain in right toe(s): Secondary | ICD-10-CM | POA: Diagnosis not present

## 2017-08-08 DIAGNOSIS — I10 Essential (primary) hypertension: Secondary | ICD-10-CM | POA: Diagnosis not present

## 2017-08-08 DIAGNOSIS — N898 Other specified noninflammatory disorders of vagina: Secondary | ICD-10-CM

## 2017-08-08 DIAGNOSIS — Z0000 Encounter for general adult medical examination without abnormal findings: Secondary | ICD-10-CM

## 2017-08-08 DIAGNOSIS — K5900 Constipation, unspecified: Secondary | ICD-10-CM | POA: Diagnosis not present

## 2017-08-08 DIAGNOSIS — E119 Type 2 diabetes mellitus without complications: Secondary | ICD-10-CM

## 2017-08-08 DIAGNOSIS — M545 Low back pain: Secondary | ICD-10-CM

## 2017-08-08 DIAGNOSIS — F17210 Nicotine dependence, cigarettes, uncomplicated: Secondary | ICD-10-CM

## 2017-08-08 DIAGNOSIS — Z124 Encounter for screening for malignant neoplasm of cervix: Secondary | ICD-10-CM

## 2017-08-08 DIAGNOSIS — Z720 Tobacco use: Secondary | ICD-10-CM

## 2017-08-08 DIAGNOSIS — H65112 Acute and subacute allergic otitis media (mucoid) (sanguinous) (serous), left ear: Secondary | ICD-10-CM

## 2017-08-08 DIAGNOSIS — B20 Human immunodeficiency virus [HIV] disease: Secondary | ICD-10-CM

## 2017-08-08 LAB — WET PREP FOR TRICH, YEAST, CLUE
CLUE CELL EXAM: POSITIVE — AB
TRICHOMONAS EXAM: NEGATIVE
YEAST EXAM: NEGATIVE

## 2017-08-08 LAB — BAYER DCA HB A1C WAIVED: HB A1C: 5.3 % (ref <7.0)

## 2017-08-08 MED ORDER — POLYETHYLENE GLYCOL 3350 17 G PO PACK: 17.0000 g | PACK | Freq: Every day | ORAL | 3 refills | Status: DC

## 2017-08-08 MED ORDER — MULTI-VITAMINS PO TABS: 1.0000 | ORAL_TABLET | Freq: Every day | ORAL | 3 refills | Status: DC

## 2017-08-08 MED ORDER — PANTOPRAZOLE SODIUM 40 MG PO TBEC: 40.0000 mg | DELAYED_RELEASE_TABLET | Freq: Every day | ORAL | 1 refills | Status: DC

## 2017-08-08 MED ORDER — AMOXICILLIN 500 MG PO CAPS: 500.0000 mg | ORAL_CAPSULE | Freq: Two times a day (BID) | ORAL | 0 refills | Status: DC

## 2017-08-08 MED ORDER — FLUOXETINE HCL 20 MG PO CAPS: 20.0000 mg | ORAL_CAPSULE | Freq: Every day | ORAL | 1 refills | Status: DC

## 2017-08-08 MED ORDER — AMLODIPINE BESYLATE 10 MG PO TABS: 10.0000 mg | ORAL_TABLET | Freq: Every day | ORAL | 0 refills | Status: DC

## 2017-08-08 NOTE — Progress Notes (Addendum)
Subjective:   Patient ID: Ashley [name], female    DOB: 1965-04-09, 52 y.o.   MRN: [id number] CC: Annual exam and chronic disease follow-up. HPI: Ashley [name] is a 52 y.o. female   H/o HIV, following with ID, last appt 1 yr ago.   Says takes her medicines regularly. Forgets about once month.  Lives on her own, son sometimes living there. Recently got disability. Remains EtOH, drug free. Feels safe at home. H/o schizophrenia, says she sees things when she gets upset. Sometimes feels itching/bites but there is not anything there.  This happens about once a month.  She takes fluoxetine daily.  Sometimes her mood bothers her, especially relationship with son and is helping at home.  She was seeing counseling in [place], transportation has been hard.    Cervical cancer screening: Overdue for Pap smear, history of abnormal  Hepatitis C: Following with Dr. Linus Salmons, not yet started treatment but lost to follow-up.  Hypertension: Sometimes feels lightheaded when she stands up. Feels off balance at times at home, such as when in shower and turning around quickly.  Taking clonidine 0.2 mg 3 times a day, amlodipine once a day.  Tobacco use: Smokes half a pack a day.  Wants to quit smoking, has been hard.  Diabetes: As recently as 2013 she was on glipizide per care everywhere.  Now she is diet controlled.  All A1c's in our system have been well controlled.  Mammogram: Due  Ventral hernia: Was planning to get this repaired, she was to have a colonoscopy prior to surgery, she has not yet had that done.  Vaginal irritation: Ongoing for last few weeks.  Sometimes there is some odor.  No new sexual partners since January 2019.  Back pain: History of scoliosis per patient, has been bothered off and on by back pain since 2001.  Lifting heavy things tends to make back pain worse so she avoids it.  Sometimes takes several days to get things like trash outside of the apartment.  Says anything more than about  5 pounds makes it painful for her to do much the next day.  Standing for prolonged periods of time can also make back pain worse.  Relevant past medical, surgical, family and social history reviewed. Allergies and medications reviewed and updated.  Left ear has been bothering her for the last couple weeks.  ROS: Per HPI   Objective:    BP 109/76   Pulse 70   Temp 98.1 F (36.7 C) (Oral)   Ht 4' 7.5" (1.41 m)   Wt 108 lb (49 kg)   BMI 24.65 kg/m   Wt Readings from Last 3 Encounters:  08/08/17 108 lb (49 kg)  01/15/17 113 lb (51.3 kg)  12/14/16 117 lb (53.1 kg)    Gen: NAD, alert, cooperative with exam, NCAT EYES: no conjunctival injection, or no icterus ENT: Left TM dull, bulging with layering white effusion.  Right TM pearly gray. OP without erythema LYMPH: no cervical LAD CV: NRRR, normal S1/S2, no murmur, distal pulses 2+ b/l Resp: CTABL, no wheezes, normal WOB Abd: +BS, soft, ventral hernia present, easily reducible.  No guarding or organomegaly Ext: No edema, warm Neuro: Alert and oriented Skin: Right great toe with approximately 4 mm thick toenail, tender to palpation along nail bed.  No fluctuance.  No erythema.  No swelling. GU: Normal external female genitalia.  No rashes or irritation.  Cervix slightly friable, small amount white discharge in vaginal vault.  Chaperone present throughout  exam.  Assessment & Plan:  Ashley [name] was seen today for annual exam and multiple medical problem follow-up.  Has missed multiple appointments recently with me.  Diagnoses and all orders for this visit:  Encounter for preventive care -     CBC with Differential/Platelet -     CMP14+EGFR -     TSH -     Lipid panel  Type 2 diabetes mellitus without complication, without long-term current use of insulin ([place]) Remains well controlled with diet alone. -     Bayer DCA Hb A1c Waived  Essential hypertension Blood pressure on low side today.  Will stop clonidine, she is having some  symptoms of hypotension at home.  Continue amlodipine. -     amLODipine (NORVASC) 10 MG tablet; Take 1 tablet (10 mg total) by mouth at bedtime.  Gastroesophageal reflux disease, esophagitis presence not specified Stable, continue below -     pantoprazole (PROTONIX) 40 MG tablet; Take 1 tablet (40 mg total) by mouth daily.  Constipation, unspecified constipation type -     polyethylene glycol (MIRALAX / GLYCOLAX) packet; Take 17 g by mouth daily.  Screening for cervical cancer Last Pap in 2017, did not follow-up with gynecology after last visit.  Pap done today. -     Pap IG, CT/NG NAA, and HPV (high risk) [facility]  Depression, recurrent ([employer]) Mood off and down, not able to make it to counseling in [place].  Will refer to virtual behavioral health. -     FLUoxetine (PROZAC) 20 MG capsule; Take 1 capsule (20 mg total) by mouth daily.  Acute otitis media Symptoms ongoing for over 2 weeks, white bulging effusion left ear.  Sent in amoxicillin 500 mg, take twice a day for 7 days.  Chronic midline low back pain without sciatica Does limit her activity some, she is primarily possible for maintaining the home.  She avoids lifting more than 5 pounds because it worsens her back pain.  Vaginal irritation -     WET PREP FOR TRICH, YEAST, CLUE -     RPR  Human immunodeficiency virus (HIV) disease ([place]) Due for follow-up with Dr. Linus Salmons  Pain of toe of right foot -     Ambulatory referral to Podiatry  Tobacco use Cessation encouraged, strategies discussed  Back pain We will refer to physical therapy.  She has paperwork to fill out to ask for assistance at home.  She does feel limited with standing and working in the kitchen to prepare meals due to the back pain.  She has fallen a couple times in the last year from turning around too fast.  When her sons at home he is able to help her up.  At least 3 times a week she feels she is not able to do her regular activities of daily living  because of the back pain.  Follow up plan: Return in about 3 months (around 11/08/2017). [name], MD [place]

## 2017-08-08 NOTE — Patient Instructions (Addendum)
Call to set up GI follow up for colonoscopy: Phone: [phone]  Call to set up mammogram in [place]: [facility] Appointment: [phone]  Call Dr. Linus Salmons to set up follow up [phone]   Gynecology at [phone]

## 2017-08-09 ENCOUNTER — Other Ambulatory Visit: Payer: Self-pay | Admitting: Pediatrics

## 2017-08-09 ENCOUNTER — Telehealth: Payer: Self-pay

## 2017-08-09 ENCOUNTER — Telehealth: Payer: Self-pay | Admitting: Pediatrics

## 2017-08-09 DIAGNOSIS — B9689 Other specified bacterial agents as the cause of diseases classified elsewhere: Secondary | ICD-10-CM

## 2017-08-09 DIAGNOSIS — N760 Acute vaginitis: Principal | ICD-10-CM

## 2017-08-09 LAB — CBC WITH DIFFERENTIAL/PLATELET
Basophils Absolute: 0 10*3/uL (ref 0.0–0.2)
Basos: 1 %
EOS (ABSOLUTE): 0.1 10*3/uL (ref 0.0–0.4)
EOS: 3 %
HEMATOCRIT: 37.9 % (ref 34.0–46.6)
HEMOGLOBIN: 12.3 g/dL (ref 11.1–15.9)
Immature Grans (Abs): 0 10*3/uL (ref 0.0–0.1)
Immature Granulocytes: 0 %
LYMPHS ABS: 2.3 10*3/uL (ref 0.7–3.1)
Lymphs: 56 %
MCH: 32.5 pg (ref 26.6–33.0)
MCHC: 32.5 g/dL (ref 31.5–35.7)
MCV: 100 fL — AB (ref 79–97)
MONOS ABS: 0.4 10*3/uL (ref 0.1–0.9)
Monocytes: 10 %
NEUTROS ABS: 1.2 10*3/uL — AB (ref 1.4–7.0)
Neutrophils: 30 %
Platelets: 134 10*3/uL — ABNORMAL LOW (ref 150–450)
RBC: 3.79 x10E6/uL (ref 3.77–5.28)
RDW: 14.8 % (ref 12.3–15.4)
WBC: 4 10*3/uL (ref 3.4–10.8)

## 2017-08-09 LAB — CMP14+EGFR
A/G RATIO: 1.7 (ref 1.2–2.2)
ALBUMIN: 4.3 g/dL (ref 3.5–5.5)
ALK PHOS: 83 IU/L (ref 39–117)
ALT: 57 IU/L — ABNORMAL HIGH (ref 0–32)
AST: 63 IU/L — AB (ref 0–40)
BILIRUBIN TOTAL: 0.4 mg/dL (ref 0.0–1.2)
BUN / CREAT RATIO: 9 (ref 9–23)
BUN: 9 mg/dL (ref 6–24)
CHLORIDE: 104 mmol/L (ref 96–106)
CO2: 23 mmol/L (ref 20–29)
Calcium: 9.3 mg/dL (ref 8.7–10.2)
Creatinine, Ser: 1.05 mg/dL — ABNORMAL HIGH (ref 0.57–1.00)
GFR calc Af Amer: 70 mL/min/{1.73_m2} (ref >59)
GFR calc non Af Amer: 61 mL/min/{1.73_m2} (ref >59)
GLOBULIN, TOTAL: 2.6 g/dL (ref 1.5–4.5)
Glucose: 70 mg/dL (ref 65–99)
POTASSIUM: 4.4 mmol/L (ref 3.5–5.2)
SODIUM: 141 mmol/L (ref 134–144)
Total Protein: 6.9 g/dL (ref 6.0–8.5)

## 2017-08-09 LAB — LIPID PANEL
CHOLESTEROL TOTAL: 165 mg/dL (ref 100–199)
Chol/HDL Ratio: 2.5 ratio (ref 0.0–4.4)
HDL: 66 mg/dL (ref >39)
LDL Calculated: 78 mg/dL (ref 0–99)
Triglycerides: 107 mg/dL (ref 0–149)
VLDL CHOLESTEROL CAL: 21 mg/dL (ref 5–40)

## 2017-08-09 LAB — TSH: TSH: 0.573 u[IU]/mL (ref 0.450–4.500)

## 2017-08-09 LAB — SYPHILIS: RPR W/REFLEX TO RPR TITER AND TREPONEMAL ANTIBODIES, TRADITIONAL SCREENING AND DIAGNOSIS ALGORITHM: RPR Ser Ql: NONREACTIVE

## 2017-08-09 MED ORDER — METRONIDAZOLE 500 MG PO TABS: 500.0000 mg | ORAL_TABLET | Freq: Two times a day (BID) | ORAL | 0 refills | Status: DC

## 2017-08-09 NOTE — Telephone Encounter (Signed)
[facility] - left message.  

## 2017-08-13 LAB — PAP IG, CT-NG NAA, HPV HIGH-RISK
Chlamydia, Nuc. Acid Amp: NEGATIVE
GONOCOCCUS BY NUCLEIC ACID AMP: NEGATIVE
HPV, high-risk: POSITIVE — AB
PAP Smear Comment: 0

## 2017-08-15 ENCOUNTER — Other Ambulatory Visit: Payer: Self-pay | Admitting: *Deleted

## 2017-08-15 ENCOUNTER — Other Ambulatory Visit: Payer: Medicaid Other

## 2017-08-15 DIAGNOSIS — B20 Human immunodeficiency virus [HIV] disease: Secondary | ICD-10-CM

## 2017-08-15 MED ORDER — DOLUTEGRAVIR SODIUM 50 MG PO TABS: 50.0000 mg | ORAL_TABLET | Freq: Every day | ORAL | 0 refills | Status: DC

## 2017-08-15 MED ORDER — EMTRICITABINE-TENOFOVIR AF 200-25 MG PO TABS: 1.0000 | ORAL_TABLET | Freq: Every day | ORAL | 0 refills | Status: DC

## 2017-08-16 LAB — T-HELPER CELL (CD4) - (RCID CLINIC ONLY)
CD4 T CELL ABS: 710 /uL (ref 400–2700)
CD4 T CELL HELPER: 30 % — AB (ref 33–55)

## 2017-08-19 LAB — HIV-1 RNA QUANT-NO REFLEX-BLD
HIV 1 RNA Quant: <20 copies/mL — AB
HIV-1 RNA Quant, Log: <1.3 Log copies/mL — AB

## 2017-08-20 ENCOUNTER — Telehealth: Payer: Self-pay

## 2017-08-20 DIAGNOSIS — Z1211 Encounter for screening for malignant neoplasm of colon: Secondary | ICD-10-CM

## 2017-08-20 DIAGNOSIS — K439 Ventral hernia without obstruction or gangrene: Secondary | ICD-10-CM

## 2017-08-20 NOTE — Telephone Encounter (Signed)
Needs a referral for hernia surgeon in [place] and For Colonscopy

## 2017-08-22 ENCOUNTER — Telehealth: Payer: Self-pay

## 2017-08-22 DIAGNOSIS — F319 Bipolar disorder, unspecified: Secondary | ICD-10-CM

## 2017-08-22 DIAGNOSIS — F2081 Schizophreniform disorder: Secondary | ICD-10-CM

## 2017-08-22 NOTE — BH Specialist Note (Signed)
[facility] Initial Clinical Assessment  MRN: [id number] NAME: DOLOROS KWOLEK Date: 08/22/17   Total time: 1 hour  Type of Contact: Type of Contact: Phone Call Initial Contact Patient consent obtained: Patient consent obtained for Virtual Visit: No Reason for Visit today: Reason for Your Call/Visit Today: [facility] Initial Assessment   Treatment History Patient recently received Inpatient Treatment: Have You Recently Been in Any Inpatient Treatment (Hospital/Detox/Crisis Center/28-Day Program)?: Yes  Facility/Program: Name/Location of Program/Hospital: 2017 [facility]; [place];                                                                                                                   [facility]  Date of discharge: When Were You Discharged?: 05/23/15 - Unable to remember the dates from previous inpatient phychiatric hospitalizations.   Patient currently being seen by therapist/psychiatrist: No - In 2018 she was being seen at [facility] for therapy but had to stop when she moved to [place]. n  Patient currently receiving the following services: Patient Currently Receiving the Following Services:: Medication Management (PCP prescribes medication)  Past Psychiatric History/Hospitalization(s): Yes - several listed above Anxiety: Yes Bipolar Disorder: Yes Depression: No Mania: No Psychosis: Yes Schizophrenia: No Personality Disorder: No Hospitalization for psychiatric illness: Yes History of Electroconvulsive Shock Therapy: No Prior Suicide Attempts: No  Decreased need for sleep: No  Euphoria: No Self Injurious behaviors No Family History of mental illness: Yes Family History of substance abuse: No  Substance Abuse: None currently - prior hx of alcoholism. Last use 4n months ago DUI: No  Insomnia: Yes  History of violence No  Physical, sexual or emotional abuse:No  Prior outpatient mental health therapy: Yes [facility] 2018       Clinical  Assessment:  PHQ-9 Assessments: Depression screen [facility] 2/9 08/22/2017 08/08/2017 12/09/2016  Decreased Interest 2 0 0  Down, Depressed, Hopeless 2 0 0  PHQ - 2 Score 4 0 0  Altered sleeping 2 - -  Tired, decreased energy 1 - -  Change in appetite - - -  Feeling bad or failure about yourself  3 - -  Trouble concentrating 1 - -  Moving slowly or fidgety/restless 0 - -  Suicidal thoughts 0 - -  PHQ-9 Score 11 - -  Difficult doing work/chores Somewhat difficult - -  Some recent data might be hidden    GAD-7 Assessments: GAD 7 : Generalized Anxiety Score 08/22/2017  Nervous, Anxious, on Edge 2  Control/stop worrying 2  Worry too much - different things 2  Trouble relaxing 2  Restless 1  Easily annoyed or irritable 3  Afraid - awful might happen 2  Total GAD 7 Score 14  Anxiety Difficulty Somewhat difficult     Social Functioning Social maturity: Social Maturity: Responsible Social judgement: Social Judgement: Normal  Stress Current stressors: Current Stressors: (Strained relationship with her son.  Currently seeing shadows whenn she feelsn stressed.) Familial stressors: Familial Stressors: None Sleep: Sleep: Decreased, Difficulty falling asleep, Difficulty staying asleep Appetite: Appetite:  No problems Coping ability: Coping ability: Overwhelmed Patient taking medications as prescribed: Patient taking medications as prescribed: Yes  Current medications:  Outpatient Encounter Medications as of 08/24/2017  Medication Sig  . acetaminophen (TYLENOL) 325 MG tablet Take 650 mg by mouth every 6 (six) hours as needed for mild pain. Reported on 06/16/2015  . amLODipine (NORVASC) 10 MG tablet Take 1 tablet (10 mg total) by mouth at bedtime.  Marland Kitchen amoxicillin (AMOXIL) 500 MG capsule Take 1 capsule (500 mg total) by mouth 2 (two) times daily.  . Black Cohosh 40 MG CAPS Take by mouth.  . cetirizine (ZYRTEC) 10 MG tablet TAKE ONE TABLET BY MOUTH EVERY DAY  . dolutegravir (TIVICAY) 50 MG  tablet Take 1 tablet (50 mg total) by mouth daily.  Marland Kitchen emtricitabine-tenofovir AF (DESCOVY) 200-25 MG tablet Take 1 tablet by mouth daily.  . fexofenadine (ALLEGRA) 180 MG tablet Take 180 mg by mouth daily.  Marland Kitchen FLUoxetine (PROZAC) 20 MG capsule Take 1 capsule (20 mg total) by mouth daily.  . megestrol (MEGACE) 40 MG tablet TAKE ONE TABLET BY MOUTH 2 TIMES A DAY  . methocarbamol (ROBAXIN) 500 MG tablet TAKE ONE TABLET BY MOUTH EVERY 8 HOURS AS NEEDED FOR MUSCLE SPASMS  . metroNIDAZOLE (FLAGYL) 500 MG tablet Take 1 tablet (500 mg total) by mouth 2 (two) times daily.  . Multiple Vitamin (MULTI-VITAMINS) TABS Take 1 tablet by mouth daily.  . Multiple Vitamins-Minerals (HAIR SKIN AND NAILS FORMULA PO) Take by mouth.  . pantoprazole (PROTONIX) 40 MG tablet Take 1 tablet (40 mg total) by mouth daily.  . polyethylene glycol (MIRALAX / GLYCOLAX) packet Take 17 g by mouth daily.  Marland Kitchen PROVENTIL HFA 108 (90 Base) MCG/ACT inhaler INHALE 2 PUFFS INTO THE LUNGS EVERY 6 HOURS AS NEEDED FOR WHEEZING ORSHORTNESS OF BREATH.  . ranitidine (ZANTAC) 150 MG tablet Take 150 mg 2 (two) times daily by mouth.  . senna (SM SENNA LAXATIVE) 8.6 MG tablet TAKE ONE TABLET BY MOUTH EVERY DAY AS NEEDED FOR CONSTIPATION  . VOLTAREN 1 % GEL APPLY 2 GRAMS TOPICALLY FOUR TIMES A DAY   No facility-administered encounter medications on file as of 2017-08-24.     Self-harm Behaviors Risk Assessment Self-harm risk factors: Self-harm risk factors: (N/A) Patient endorses recent thoughts of harming self: Have you recently had any thoughts about harming yourself?: No  [place] Suicide Severity Rating Scale:  C-SRSS 08/24/2017  1. Wish to be Dead No  2. Suicidal Thoughts No  6. Suicide Behavior Question No    Danger to Others Risk Assessment Danger to others risk factors: Danger to Others Risk Factors: No risk factors noted Patient endorses recent thoughts of harming others: Notification required: No need or identified person  Dynamic  Appraisal of Situational Aggression (DASA):  CHL DYNAMIC APPRAISAL OF SITUATIONAL AGGRESSION (DASA) 04/30/2014 04/30/2014 05/01/2014  Irritability 0 0 0  Impulsivity 0 0 0  Unwillingness to Follow Directions 0 0 0  Sensitivity to Perceived Provocation 0 0 0  Easily Angered When Requests are Denied 0 0 0  Negative Attitudes 0 0 0  Verbal Threats 0 0 0  Total DASA Score 0 0 0  Final Risk Rating Low Risk Low Risk Low Risk  Physical Aggression against OBJECTS No No No  Verbal Aggression against OTHER PEOPLE No No No  Physical Aggression against OTHER PEOPLE No No No    Substance Use Assessment Patient recently consumed alcohol: Have you recently consumed alcohol?: No  Alcohol Use Disorder Identification Test (AUDIT):  Alcohol Use Disorder Test (AUDIT) 04/30/2014 08/22/2017  1. How often do you have a drink containing alcohol? 1 0  2. How many drinks containing alcohol do you have on a typical day when you are drinking? 0 0  3. How often do you have six or more drinks on one occasion? 0 0  AUDIT-C Score 0 0  9. Have you or someone else been injured as a result of your drinking? 0 -  10. Has a relative or friend or a doctor or another health worker been concerned about your drinking or suggested you cut down? 2 -  Alcohol Use Disorder Identification Test Final Score (AUDIT) 3 -  Intervention/Follow-up - AUDIT Score <7 follow-up not indicated   Patient recently used drugs: Have you recently used any drugs?: No  Opioid Risk Assessment: No Patient is concerned about dependence or abuse of substances: Does patient seem concerned about dependence or abuse of any substance?: No    Goals, Interventions and Follow-up Plan Goals: Increase healthy adjustment to current life circumstances Interventions: Motivational Interviewing, Solution-Focused Strategies, Mindfulness or Psychologist, educational, Behavioral Activation, Brief CBT, Supportive Counseling and Sleep Hygiene Follow-up Plan: [facility] Follow up    Summary of Clinical Assessment Summary:   Patient is an 52 year old female that reports seeing shadows when she become upset.  Patient nreports that sometimes she feels itching/bites but there is not anything there.  This happens about once a month.  She takes fluoxetine daily.   Patient reports previous inpatient psychiatric hospitalizations.  Patient reports that the medication that she is taking does not help with hearing the loud noises or seeing the shadows.  Patient reports compliance with taking her medication.  Patient reports that she has been seeing shadow and hearing noises since her early twenties.   Patient denies command hallucinations.  Patient denies a history of violence.   Patient reports poor sleep in which she cannot stay asleep. Patient report that her son and his pregnant girlfriend live with her.  Patient reports anxiety and frustration because her son does not help her with the bills. Patient receives disability since 06-04-2017.  Patients mother died 85 yrs ago and her mother died 66 years ago. Johnnye Sima, Cartina Brousseau [place], LCAS-A

## 2017-08-22 NOTE — Telephone Encounter (Signed)
Referrals put in.  She should hear within 1 week.

## 2017-08-22 NOTE — Addendum Note (Signed)
Addended by: Eustaquio Maize on: 08/22/2017 04:15 PM   Modules accepted: Orders

## 2017-08-23 ENCOUNTER — Encounter (INDEPENDENT_AMBULATORY_CARE_PROVIDER_SITE_OTHER): Payer: Self-pay | Admitting: *Deleted

## 2017-08-24 ENCOUNTER — Encounter: Payer: Self-pay | Admitting: Podiatry

## 2017-08-24 ENCOUNTER — Ambulatory Visit: Payer: Medicaid Other | Admitting: Podiatry

## 2017-08-24 VITALS — BP 102/66 | HR 71

## 2017-08-24 DIAGNOSIS — B351 Tinea unguium: Secondary | ICD-10-CM | POA: Diagnosis not present

## 2017-08-24 DIAGNOSIS — M79676 Pain in unspecified toe(s): Secondary | ICD-10-CM

## 2017-08-24 DIAGNOSIS — M79609 Pain in unspecified limb: Principal | ICD-10-CM

## 2017-08-26 NOTE — Progress Notes (Signed)
Subjective:  Patient ID: Ashley [name], female    DOB: May 06, 1965,  MRN: [id number]  Chief Complaint  Patient presents with  . Nail Problem    B/L great toenails are lifting from nail bed, sore since 2 years with no improvements   52 y.o. female returns for the above complaint.  Reports both great toenails are lifting from the nail bed reports soreness for the past 2 years no improvement noted.  Past Medical History:  Diagnosis Date  . Ankle fracture   . Anxiety   . Asthma   . Collagen vascular disease ([place])   . Depression   . Diabetes mellitus   . Hepatitis C   . Hernia   . HIV (human immunodeficiency virus infection) ([place])   . HIV (human immunodeficiency virus infection) ([place])   . Hypertension   . Pinched nerve   . Scoliosis   . TB (tuberculosis)    Past Surgical History:  Procedure Laterality Date  . CESAREAN SECTION    . CHOLECYSTECTOMY    . HERNIA REPAIR    . LIVER BIOPSY      Current Outpatient Medications:  .  acetaminophen (TYLENOL) 325 MG tablet, Take 650 mg by mouth every 6 (six) hours as needed for mild pain. Reported on 06/16/2015, Disp: , Rfl:  .  amLODipine (NORVASC) 10 MG tablet, Take 1 tablet (10 mg total) by mouth at bedtime., Disp: 90 tablet, Rfl: 0 .  amoxicillin (AMOXIL) 500 MG capsule, Take 1 capsule (500 mg total) by mouth 2 (two) times daily., Disp: 14 capsule, Rfl: 0 .  Black Cohosh 40 MG CAPS, Take by mouth., Disp: , Rfl:  .  cetirizine (ZYRTEC) 10 MG tablet, TAKE ONE TABLET BY MOUTH EVERY DAY, Disp: 30 tablet, Rfl: 11 .  dolutegravir (TIVICAY) 50 MG tablet, Take 1 tablet (50 mg total) by mouth daily., Disp: 30 tablet, Rfl: 0 .  emtricitabine-tenofovir AF (DESCOVY) 200-25 MG tablet, Take 1 tablet by mouth daily., Disp: 30 tablet, Rfl: 0 .  fexofenadine (ALLEGRA) 180 MG tablet, Take 180 mg by mouth daily., Disp: , Rfl:  .  FLUoxetine (PROZAC) 20 MG capsule, Take 1 capsule (20 mg total) by mouth daily., Disp: 90 capsule, Rfl: 1 .  megestrol (MEGACE)  40 MG tablet, TAKE ONE TABLET BY MOUTH 2 TIMES A DAY, Disp: 60 tablet, Rfl: 2 .  methocarbamol (ROBAXIN) 500 MG tablet, TAKE ONE TABLET BY MOUTH EVERY 8 HOURS AS NEEDED FOR MUSCLE SPASMS, Disp: 30 tablet, Rfl: 0 .  metroNIDAZOLE (FLAGYL) 500 MG tablet, Take 1 tablet (500 mg total) by mouth 2 (two) times daily., Disp: 14 tablet, Rfl: 0 .  Multiple Vitamin (MULTI-VITAMINS) TABS, Take 1 tablet by mouth daily., Disp: 90 tablet, Rfl: 3 .  Multiple Vitamins-Minerals (HAIR SKIN AND NAILS FORMULA PO), Take by mouth., Disp: , Rfl:  .  pantoprazole (PROTONIX) 40 MG tablet, Take 1 tablet (40 mg total) by mouth daily., Disp: 90 tablet, Rfl: 1 .  polyethylene glycol (MIRALAX / GLYCOLAX) packet, Take 17 g by mouth daily., Disp: 90 each, Rfl: 3 .  PROVENTIL HFA 108 (90 Base) MCG/ACT inhaler, INHALE 2 PUFFS INTO THE LUNGS EVERY 6 HOURS AS NEEDED FOR WHEEZING ORSHORTNESS OF BREATH., Disp: 6.7 g, Rfl: 0 .  ranitidine (ZANTAC) 150 MG tablet, Take 150 mg 2 (two) times daily by mouth., Disp: , Rfl:  .  senna (SM SENNA LAXATIVE) 8.6 MG tablet, TAKE ONE TABLET BY MOUTH EVERY DAY AS NEEDED FOR CONSTIPATION, Disp: 30 tablet, Rfl:  0 .  VOLTAREN 1 % GEL, APPLY 2 GRAMS TOPICALLY FOUR TIMES A DAY, Disp: 100 g, Rfl: 0  Allergies  Allergen Reactions  . Aspirin Other (See Comments)    Liver problems     Objective:   Vitals:   08/24/17 1235  BP: 102/66  Pulse: 71   General AA&O x3. Normal mood and affect.  Vascular Pedal pulses palpable.  Neurologic Epicritic sensation grossly intact.  Dermatologic No open lesions. Skin normal texture and turgor. Toenails x 10 elongated, thickened, dystrophic.  Orthopedic: Pain to palpation about the toenails.   Assessment & Plan:  Patient was evaluated and treated and all questions answered.  Onychomycosis with pain  -Nails palliatively debrided as below. -Educated on self-care  Procedure: Nail Debridement Rationale: pain  Type of Debridement: manual, sharp  debridement. Instrumentation: Nail nipper, rotary burr. Number of Nails: 10   Return if symptoms worsen or fail to improve.

## 2017-08-28 NOTE — Telephone Encounter (Signed)
Referral has been placed. 

## 2017-08-29 ENCOUNTER — Encounter: Payer: Self-pay | Admitting: Internal Medicine

## 2017-08-29 NOTE — Progress Notes (Signed)
Patient reports Ashley [name]. History of alcohol use disorder, bipolar disorder. Will make referral to face to face evaluation with psych/this note Probation officer.

## 2017-09-04 ENCOUNTER — Other Ambulatory Visit: Payer: Self-pay | Admitting: Internal Medicine

## 2017-09-04 DIAGNOSIS — B20 Human immunodeficiency virus [HIV] disease: Secondary | ICD-10-CM

## 2017-09-05 ENCOUNTER — Ambulatory Visit: Payer: Medicaid Other | Admitting: Certified Nurse Midwife

## 2017-09-17 ENCOUNTER — Other Ambulatory Visit: Payer: Self-pay | Admitting: Pediatrics

## 2017-09-17 DIAGNOSIS — J439 Emphysema, unspecified: Secondary | ICD-10-CM

## 2017-09-26 ENCOUNTER — Telehealth: Payer: Self-pay

## 2017-09-26 NOTE — Telephone Encounter (Signed)
[facility] - Left Msg 

## 2017-10-02 ENCOUNTER — Telehealth: Payer: Self-pay

## 2017-10-02 NOTE — Telephone Encounter (Signed)
[facility] - 2nd attempt  

## 2017-10-05 ENCOUNTER — Other Ambulatory Visit: Payer: Self-pay | Admitting: Internal Medicine

## 2017-10-05 DIAGNOSIS — B20 Human immunodeficiency virus [HIV] disease: Secondary | ICD-10-CM

## 2017-10-05 NOTE — Telephone Encounter (Signed)
Placed call to patient to schedule a 6 Month follow up appointment with Dr. Linus Salmons. Patient last office visit noted on 06/29/16. Patient did come to lab visit 08/15/17. Patient office visit scheduled for 10/16/17 at 10:30am. Patient made aware. Patient appreciative of phone call.  S.Meera Vasco LPN

## 2017-10-09 ENCOUNTER — Telehealth: Payer: Self-pay

## 2017-10-09 NOTE — Telephone Encounter (Signed)
[facility] - 3rd attempt ; Left voice mail message in an attempt to schedule an appointment with Dr. Modesta Messing.

## 2017-10-16 ENCOUNTER — Ambulatory Visit: Payer: Self-pay | Admitting: Internal Medicine

## 2017-10-17 ENCOUNTER — Encounter: Payer: Self-pay | Admitting: Internal Medicine

## 2017-10-17 ENCOUNTER — Ambulatory Visit (INDEPENDENT_AMBULATORY_CARE_PROVIDER_SITE_OTHER): Payer: Medicaid Other | Admitting: Licensed Clinical Social Worker

## 2017-10-17 ENCOUNTER — Ambulatory Visit (INDEPENDENT_AMBULATORY_CARE_PROVIDER_SITE_OTHER): Payer: Medicaid Other | Admitting: Internal Medicine

## 2017-10-17 VITALS — BP 145/88 | HR 65 | Temp 98.7°F | Wt 105.0 lb

## 2017-10-17 DIAGNOSIS — K7400 Hepatic fibrosis, unspecified: Secondary | ICD-10-CM

## 2017-10-17 DIAGNOSIS — F32A Depression, unspecified: Secondary | ICD-10-CM | POA: Insufficient documentation

## 2017-10-17 DIAGNOSIS — F329 Major depressive disorder, single episode, unspecified: Secondary | ICD-10-CM | POA: Diagnosis not present

## 2017-10-17 DIAGNOSIS — B20 Human immunodeficiency virus [HIV] disease: Secondary | ICD-10-CM | POA: Diagnosis not present

## 2017-10-17 DIAGNOSIS — F331 Major depressive disorder, recurrent, moderate: Secondary | ICD-10-CM

## 2017-10-17 DIAGNOSIS — Z23 Encounter for immunization: Secondary | ICD-10-CM | POA: Diagnosis not present

## 2017-10-17 DIAGNOSIS — B182 Chronic viral hepatitis C: Secondary | ICD-10-CM

## 2017-10-17 MED ORDER — GLECAPREVIR-PIBRENTASVIR 100-40 MG PO TABS: 3.0000 | ORAL_TABLET | Freq: Every day | ORAL | 2 refills | Status: DC

## 2017-10-17 NOTE — Assessment & Plan Note (Signed)
No HCC screening indicated

## 2017-10-17 NOTE — BH Specialist Note (Signed)
Integrated Behavioral Health Initial Visit  MRN: [id number] Name: Ashley [name]  Number of [place] visits:: 1/6 Session Start time: 10:33am  Session End time: 11:04am Total time: 30 minutes  Type of Service: [place] Interpretor:No. Interpretor Name and Language: n/a   Warm Hand Off Completed.       SUBJECTIVE: Ashley [name] is a 52 y.o. female accompanied by self Patient was referred by Dr. Linus Salmons for depressive symptoms. Patient reports the following symptoms/concerns: stress over relationship with son, nervousness, lack of interest in activities, wanting to be alone, crying a lot, sad mood, angers easily, wants to sleep a lot Duration of problem: 2 months; Severity of problem: moderate  OBJECTIVE: Mood: Depressed and Affect: Constricted Risk of harm to self or others: No plan to harm self or others, reports sometimes telling God it would be okay to take her, but has no thoughts of harming herself or suicidal intent  LIFE CONTEXT: Patient lives alone and has 2 sons. The oldest is 75, has 3 children but only his 54 month old son in his custody, and is homeless. This son, his girlfriend and the baby have been staying with patient quite a bit, which is stressful for her, but she reports she has to let them stay because she cannot turn the baby out. Patient's younger son lives on his own nearby and has a child on the way. He and patient have a good relationship, but it is under some strain because there is conflict between the two sons. Patient reports that she receives disability and has some financial concerns; currently she is struggling with food because there was a problem with her food stamps that has yet to be addressed. Her main supports are her younger (adult) son, her brother, a best friend and a new friend she just met a couple of months ago.  GOALS ADDRESSED: Patient will: 1. Reduce symptoms of:  depression  INTERVENTIONS: Interventions utilized: Motivational Interviewing, Brief CBT and Supportive Counseling   ASSESSMENT: Patient currently experiencing  nervousness, anhedonia, isolating, crying spells, depressed mood, irritability, hypersomnia. Symptoms are consistent with a Moderate episode of Recurrent Major Depressive Disorder. She shares that her relationship with her oldest son is the primary trigger for her depression. In the last year or so their relationship has changed, and now he never comes to see her or contacts her unless he needs something. Patient indicates the last month or two of his girlfriend's pregnancy and first month of their child's life have been very stressful for her because she feels she has to house them when they ask. Not only is this the only way she sees her grandson, but she also cannot bring herself to say they cannot stay with her since this will expose the child to the elements or possibly to people that could be harmful to him. Counselor validated these concerns and commended patient for her caring toward her infant grandson. Patient and counselor explored what patient would like to see be different in her life. Counselor coached patient in identifying the importance of self-care and techniques she can use. Patient was able to recognize that taking a walk to the store and talking to people there, listening to gospel or uplifting music, attending church, and helping a friend with her plants are all ways that she receives positive energy and feeds herself spiritually. Counselor educated patient on the cognitive triangle, and explored with her ways that engaging in self-care behaviors can positively impact her  thoughts and mood.   Patient may benefit from ongoing CBT.  PLAN: 1. Follow up with behavioral health clinician on : 10/24/17@11am    Lillie Fragmin, LCSW

## 2017-10-17 NOTE — Assessment & Plan Note (Signed)
Discussed prevnar and given today

## 2017-10-17 NOTE — Addendum Note (Signed)
Addended by: Eugenia Mcalpine on: 10/17/2017 11:10 AM   Modules accepted: Orders

## 2017-10-17 NOTE — Progress Notes (Signed)
Patient received [id number] vaccination today to Left arm. Patient tolerated well. Patient also met with Rollene Fare for counseling services.   S.Sherlock Nancarrow, LPN

## 2017-10-17 NOTE — Assessment & Plan Note (Signed)
Doing well and will confirm with labs today. Repeat labs in 6 months.

## 2017-10-17 NOTE — Progress Notes (Signed)
   Subjective:    Patient ID: Ashley [name], female    DOB: May 22, 1965, 52 y.o.   MRN: [id number]  HPI Here for follow up of HIV and chronic hepatitis C Has not been seen in over 1 year.  Has moved to [place] and has difficulty with transportation.  Last CD4 of 710 and viral load < 20.  Tearful today due to home situation.  Older son moved in with grandbaby and she is having trouble with him.  Has genotype 1a, elastoraphy F2, some F3.     Review of Systems  Constitutional: Negative for fatigue.  Gastrointestinal: Negative for diarrhea.  Skin: Negative for rash.       Objective:   Physical Exam  Constitutional: She appears well-developed and well-nourished. No distress.  HENT:  Mouth/Throat: No oropharyngeal exudate.  Eyes: No scleral icterus.  Cardiovascular: Normal rate, regular rhythm and normal heart sounds.  No murmur heard. Pulmonary/Chest: Effort normal and breath sounds normal. No respiratory distress.  Skin: No rash noted.    SH: remains drug free      Assessment & Plan:

## 2017-10-17 NOTE — Assessment & Plan Note (Addendum)
Tearful due to her living situation She will talk to our counselor today No SI

## 2017-10-17 NOTE — Assessment & Plan Note (Addendum)
Will prescribe Mavyret  Repeat labs today I will have her scheduled with me in 2 months, hopefully on medication.

## 2017-10-18 LAB — T-HELPER CELL (CD4) - (RCID CLINIC ONLY)
CD4 % Helper T Cell: 36 % (ref 33–55)
CD4 T Cell Abs: 740 /uL (ref 400–2700)

## 2017-10-19 ENCOUNTER — Other Ambulatory Visit: Payer: Self-pay | Admitting: Pediatrics

## 2017-10-19 DIAGNOSIS — I10 Essential (primary) hypertension: Secondary | ICD-10-CM

## 2017-10-21 LAB — COMPLETE METABOLIC PANEL WITH GFR
AG Ratio: 1.4 (calc) (ref 1.0–2.5)
ALBUMIN MSPROF: 4.5 g/dL (ref 3.6–5.1)
ALKALINE PHOSPHATASE (APISO): 88 U/L (ref 33–130)
ALT: 74 U/L — ABNORMAL HIGH (ref 6–29)
AST: 73 U/L — ABNORMAL HIGH (ref 10–35)
BILIRUBIN TOTAL: 0.6 mg/dL (ref 0.2–1.2)
BUN: 7 mg/dL (ref 7–25)
CHLORIDE: 105 mmol/L (ref 98–110)
CO2: 27 mmol/L (ref 20–32)
Calcium: 9.8 mg/dL (ref 8.6–10.4)
Creat: 0.92 mg/dL (ref 0.50–1.05)
GFR, EST AFRICAN AMERICAN: 82 mL/min/{1.73_m2} (ref >=60)
GFR, Est Non African American: 71 mL/min/{1.73_m2} (ref >=60)
GLOBULIN: 3.3 g/dL (ref 1.9–3.7)
Glucose, Bld: 86 mg/dL (ref 65–99)
Potassium: 4.1 mmol/L (ref 3.5–5.3)
SODIUM: 142 mmol/L (ref 135–146)
TOTAL PROTEIN: 7.8 g/dL (ref 6.1–8.1)

## 2017-10-21 LAB — CBC WITH DIFFERENTIAL/PLATELET
BASOS ABS: 39 {cells}/uL (ref 0–200)
Basophils Relative: 1 %
Eosinophils Absolute: 160 cells/uL (ref 15–500)
Eosinophils Relative: 4.1 %
HCT: 41.4 % (ref 35.0–45.0)
HEMOGLOBIN: 14 g/dL (ref 11.7–15.5)
Lymphs Abs: 2071 cells/uL (ref 850–3900)
MCH: 34.1 pg — AB (ref 27.0–33.0)
MCHC: 33.8 g/dL (ref 32.0–36.0)
MCV: 100.7 fL — ABNORMAL HIGH (ref 80.0–100.0)
MONOS PCT: 8 %
MPV: 11.8 fL (ref 7.5–12.5)
NEUTROS ABS: 1318 {cells}/uL — AB (ref 1500–7800)
NEUTROS PCT: 33.8 %
PLATELETS: 141 10*3/uL (ref 140–400)
RBC: 4.11 10*6/uL (ref 3.80–5.10)
RDW: 12.6 % (ref 11.0–15.0)
TOTAL LYMPHOCYTE: 53.1 %
WBC mixed population: 312 cells/uL (ref 200–950)
WBC: 3.9 10*3/uL (ref 3.8–10.8)

## 2017-10-21 LAB — HEPATITIS C GENOTYPE

## 2017-10-21 LAB — HIV-1 RNA QUANT-NO REFLEX-BLD
HIV 1 RNA Quant: 53 copies/mL — ABNORMAL HIGH
HIV-1 RNA Quant, Log: 1.72 Log copies/mL — ABNORMAL HIGH

## 2017-10-21 LAB — HEPATITIS C RNA QUANTITATIVE
HCV QUANT LOG: 6.39 {Log_IU}/mL — AB
HCV RNA, PCR, QN: 2460000 IU/mL — ABNORMAL HIGH

## 2017-10-23 ENCOUNTER — Ambulatory Visit: Payer: Medicaid Other | Admitting: Obstetrics

## 2017-10-24 ENCOUNTER — Ambulatory Visit: Payer: Self-pay | Admitting: Licensed Clinical Social Worker

## 2017-10-26 ENCOUNTER — Ambulatory Visit: Payer: Medicaid Other | Admitting: Family Medicine

## 2017-10-31 ENCOUNTER — Ambulatory Visit: Payer: Medicaid Other | Admitting: Obstetrics

## 2017-11-02 ENCOUNTER — Ambulatory Visit: Payer: Self-pay | Admitting: Licensed Clinical Social Worker

## 2017-11-02 ENCOUNTER — Encounter: Payer: Self-pay | Admitting: Pharmacy Technician

## 2017-11-02 MED FILL — MAVYRET 100-40 MG TABS: 100-40 | 28 days supply | Qty: 84 | Fill #0

## 2017-11-06 ENCOUNTER — Telehealth: Payer: Self-pay

## 2017-11-06 NOTE — Telephone Encounter (Signed)
[facility] - Several attempts have been made to contact patient without success. Patient will be placed on the inactive list.  If services are needed again.  Please contact [facility] at [phone].    Information will be routed to the PCP and Dr. Hisada  

## 2017-11-06 NOTE — Telephone Encounter (Signed)
Error in Charting

## 2017-11-12 ENCOUNTER — Ambulatory Visit: Payer: Medicaid Other | Admitting: Pediatrics

## 2017-11-13 ENCOUNTER — Ambulatory Visit: Payer: Medicaid Other | Admitting: Obstetrics

## 2017-11-27 MED FILL — MAVYRET 100-40 MG TABS: 100-40 | 28 days supply | Qty: 84 | Fill #1

## 2017-12-05 ENCOUNTER — Ambulatory Visit: Payer: Medicaid Other | Admitting: Pediatrics

## 2017-12-13 ENCOUNTER — Ambulatory Visit: Payer: Medicaid Other | Admitting: Obstetrics

## 2017-12-17 ENCOUNTER — Ambulatory Visit: Payer: Self-pay | Admitting: Internal Medicine

## 2017-12-19 ENCOUNTER — Encounter: Payer: Self-pay | Admitting: Pediatrics

## 2018-01-01 NOTE — Progress Notes (Deleted)
Last PAP: 08/08/17 Abnormal: HPV-High risk

## 2018-01-02 ENCOUNTER — Ambulatory Visit: Payer: Medicaid Other | Admitting: Obstetrics

## 2018-01-04 ENCOUNTER — Ambulatory Visit: Payer: Medicaid Other | Admitting: Pediatrics

## 2018-01-07 ENCOUNTER — Other Ambulatory Visit: Payer: Medicaid Other

## 2018-01-07 ENCOUNTER — Other Ambulatory Visit: Payer: Self-pay | Admitting: *Deleted

## 2018-01-07 DIAGNOSIS — B182 Chronic viral hepatitis C: Secondary | ICD-10-CM

## 2018-01-14 ENCOUNTER — Ambulatory Visit: Payer: Medicaid Other | Admitting: Obstetrics

## 2018-01-21 ENCOUNTER — Encounter: Payer: Self-pay | Admitting: Internal Medicine

## 2018-01-30 ENCOUNTER — Other Ambulatory Visit: Payer: Medicaid Other

## 2018-01-30 DIAGNOSIS — B182 Chronic viral hepatitis C: Secondary | ICD-10-CM

## 2018-02-01 ENCOUNTER — Other Ambulatory Visit: Payer: Self-pay | Admitting: Pediatrics

## 2018-02-01 DIAGNOSIS — J439 Emphysema, unspecified: Secondary | ICD-10-CM

## 2018-02-02 LAB — COMPLETE METABOLIC PANEL WITH GFR
AG RATIO: 1.9 (calc) (ref 1.0–2.5)
ALT: 18 U/L (ref 6–29)
AST: 32 U/L (ref 10–35)
Albumin: 4.6 g/dL (ref 3.6–5.1)
Alkaline phosphatase (APISO): 76 U/L (ref 33–130)
BILIRUBIN TOTAL: 0.5 mg/dL (ref 0.2–1.2)
BUN: 14 mg/dL (ref 7–25)
CO2: 27 mmol/L (ref 20–32)
Calcium: 9.6 mg/dL (ref 8.6–10.4)
Chloride: 108 mmol/L (ref 98–110)
Creat: 1.02 mg/dL (ref 0.50–1.05)
GFR, EST AFRICAN AMERICAN: 72 mL/min/{1.73_m2} (ref >=60)
GFR, Est Non African American: 62 mL/min/{1.73_m2} (ref >=60)
Globulin: 2.4 g/dL (calc) (ref 1.9–3.7)
Glucose, Bld: 53 mg/dL — ABNORMAL LOW (ref 65–99)
Potassium: 4.1 mmol/L (ref 3.5–5.3)
Sodium: 140 mmol/L (ref 135–146)
TOTAL PROTEIN: 7 g/dL (ref 6.1–8.1)

## 2018-02-02 LAB — HEPATITIS C RNA QUANTITATIVE
HCV Quantitative Log: <1.18 Log IU/mL
HCV RNA, PCR, QN: <15 IU/mL

## 2018-02-13 ENCOUNTER — Encounter: Payer: Self-pay | Admitting: Internal Medicine

## 2018-02-28 ENCOUNTER — Other Ambulatory Visit: Payer: Self-pay | Admitting: Pediatrics

## 2018-02-28 DIAGNOSIS — J439 Emphysema, unspecified: Secondary | ICD-10-CM

## 2018-03-19 ENCOUNTER — Other Ambulatory Visit: Payer: Self-pay | Admitting: Pediatrics

## 2018-03-19 DIAGNOSIS — K219 Gastro-esophageal reflux disease without esophagitis: Secondary | ICD-10-CM

## 2018-03-19 DIAGNOSIS — I10 Essential (primary) hypertension: Secondary | ICD-10-CM

## 2018-03-19 DIAGNOSIS — F339 Major depressive disorder, recurrent, unspecified: Secondary | ICD-10-CM

## 2018-03-20 NOTE — Telephone Encounter (Signed)
Ashley [name]. NTBS last OV 08/08/17. Mail order RF

## 2018-03-21 NOTE — Telephone Encounter (Signed)
appt given for 03/26/2018

## 2018-03-26 ENCOUNTER — Ambulatory Visit (INDEPENDENT_AMBULATORY_CARE_PROVIDER_SITE_OTHER): Payer: Medicaid Other | Admitting: Pharmacist

## 2018-03-26 ENCOUNTER — Encounter: Payer: Self-pay | Admitting: Family Medicine

## 2018-03-26 ENCOUNTER — Ambulatory Visit: Payer: Medicaid Other | Admitting: Family Medicine

## 2018-03-26 VITALS — BP 116/79 | HR 84 | Temp 96.9°F | Ht <= 58 in | Wt 114.0 lb

## 2018-03-26 DIAGNOSIS — J439 Emphysema, unspecified: Secondary | ICD-10-CM

## 2018-03-26 DIAGNOSIS — F339 Major depressive disorder, recurrent, unspecified: Secondary | ICD-10-CM

## 2018-03-26 DIAGNOSIS — J309 Allergic rhinitis, unspecified: Secondary | ICD-10-CM

## 2018-03-26 DIAGNOSIS — B20 Human immunodeficiency virus [HIV] disease: Secondary | ICD-10-CM

## 2018-03-26 DIAGNOSIS — E119 Type 2 diabetes mellitus without complications: Secondary | ICD-10-CM

## 2018-03-26 DIAGNOSIS — B182 Chronic viral hepatitis C: Secondary | ICD-10-CM

## 2018-03-26 DIAGNOSIS — K5900 Constipation, unspecified: Secondary | ICD-10-CM

## 2018-03-26 DIAGNOSIS — K219 Gastro-esophageal reflux disease without esophagitis: Secondary | ICD-10-CM

## 2018-03-26 DIAGNOSIS — M545 Low back pain, unspecified: Secondary | ICD-10-CM

## 2018-03-26 DIAGNOSIS — E1159 Type 2 diabetes mellitus with other circulatory complications: Secondary | ICD-10-CM

## 2018-03-26 DIAGNOSIS — K625 Hemorrhage of anus and rectum: Secondary | ICD-10-CM

## 2018-03-26 DIAGNOSIS — I10 Essential (primary) hypertension: Secondary | ICD-10-CM

## 2018-03-26 DIAGNOSIS — G8929 Other chronic pain: Secondary | ICD-10-CM

## 2018-03-26 LAB — BAYER DCA HB A1C WAIVED: HB A1C (BAYER DCA - WAIVED): 5.4 % (ref <7.0)

## 2018-03-26 MED ORDER — PANTOPRAZOLE SODIUM 40 MG PO TBEC: 40.0000 mg | DELAYED_RELEASE_TABLET | Freq: Every day | ORAL | 1 refills | Status: DC

## 2018-03-26 MED ORDER — ALBUTEROL SULFATE HFA 108 (90 BASE) MCG/ACT IN AERS: 2.0000 | INHALATION_SPRAY | Freq: Four times a day (QID) | RESPIRATORY_TRACT | 1 refills | Status: DC | PRN

## 2018-03-26 MED ORDER — SENNOSIDES 8.6 MG PO TABS: ORAL_TABLET | ORAL | 0 refills | Status: DC

## 2018-03-26 MED ORDER — AMLODIPINE BESYLATE 10 MG PO TABS: 10.0000 mg | ORAL_TABLET | Freq: Every day | ORAL | 3 refills | Status: DC

## 2018-03-26 MED ORDER — FLUOXETINE HCL 20 MG PO CAPS: 20.0000 mg | ORAL_CAPSULE | Freq: Every day | ORAL | 1 refills | Status: DC

## 2018-03-26 MED ORDER — CETIRIZINE HCL 10 MG PO TABS: 10.0000 mg | ORAL_TABLET | Freq: Every day | ORAL | 3 refills | Status: DC

## 2018-03-26 MED ORDER — FLUTICASONE PROPIONATE 50 MCG/ACT NA SUSP: 2.0000 | Freq: Every day | NASAL | 6 refills | Status: DC

## 2018-03-26 MED ORDER — POLYETHYLENE GLYCOL 3350 17 G PO PACK: 17.0000 g | PACK | Freq: Every day | ORAL | 3 refills | Status: DC

## 2018-03-26 MED ORDER — BICTEGRAVIR-EMTRICITAB-TENOFOV 50-200-25 MG PO TABS: 1.0000 | ORAL_TABLET | Freq: Every day | ORAL | 3 refills | Status: DC

## 2018-03-26 MED ORDER — METHOCARBAMOL 500 MG PO TABS: ORAL_TABLET | ORAL | 0 refills | Status: DC

## 2018-03-26 NOTE — Progress Notes (Signed)
Subjective: CC: f/u HTN,  PCP: Ashley Maize, MD Ashley [name] is a 53 y.o. female presenting to clinic today for:  1. Type 2 Diabetes w/ HTN:  Patient reports compliance with Norvasc 10 mg daily.  She has been diet-controlled from the diabetes standpoint has not had to take medications.  Last eye exam: Due for eye exam Last foot exam: Up-to-date Last A1c: Less than 7 Nephropathy screen indicated?: ordered Last flu, zoster and/or pneumovax: needs flu shot  ROS: denies fever, chills, dizziness, LOC, polyuria, polydipsia, unintended weight loss/gain, foot ulcerations, numbness or tingling in extremities or chest pain.  2.  GERD/constipation Patient reports good control of acid reflux with Protonix but does note that she had some bleeding in her stool a few days ago.  She has a history of chronic constipation with good relief with MiraLAX.  She needs refills on both tonics and MiraLAX.  She states that she attempted to contact the gastroenterologist but they have asked that she contact our office first for referral.  She was being seen in [place] per her report.  Medical history also significant for hepatitis C and HIV.  She is under the care of infectious disease for these issues.  3.  Depression Patient reports good control depression with Prozac 20 mg daily.  No SI or HI.  She needs refills.  4.  Chronic low back pain Patient reports chronic low back pain.  She denies any falls or weakness.  She points to the mid low back as the area of most pain.  She occasionally needs to take Robaxin to relieve this but reports rare use.  She needs refills.  5.    ROS: Per HPI  Allergies  Allergen Reactions  . Aspirin Other (See Comments)    Liver problems   Past Medical History:  Diagnosis Date  . Ankle fracture   . Anxiety   . Asthma   . Collagen vascular disease ([place])   . Depression   . Diabetes mellitus   . Elevated LFTs 01/13/2012  . Hepatitis C   . Hernia   . HIV  (human immunodeficiency virus infection) ([place])   . HIV (human immunodeficiency virus infection) ([place])   . Hypertension   . Pinched nerve   . Scoliosis   . TB (tuberculosis)     Current Outpatient Medications:  .  acetaminophen (TYLENOL) 325 MG tablet, Take 650 mg by mouth every 6 (six) hours as needed for mild pain. Reported on 06/16/2015, Disp: , Rfl:  .  albuterol (PROVENTIL HFA) 108 (90 Base) MCG/ACT inhaler, Inhale 2 puffs into the lungs every 6 (six) hours as needed for wheezing or shortness of breath. (Needs to be seen before next refill), Disp: 6.7 g, Rfl: 0 .  amLODipine (NORVASC) 10 MG tablet, TAKE ONE TABLET BY MOUTH DAILY AT BEDTIME, Disp: 90 tablet, Rfl: 0 .  bictegravir-emtricitabine-tenofovir AF (BIKTARVY) 50-200-25 MG TABS tablet, Take 1 tablet by mouth daily., Disp: 30 tablet, Rfl: 3 .  Black Cohosh 40 MG CAPS, Take by mouth., Disp: , Rfl:  .  cetirizine (ZYRTEC) 10 MG tablet, TAKE ONE TABLET BY MOUTH EVERY DAY, Disp: 30 tablet, Rfl: 11 .  fexofenadine (ALLEGRA) 180 MG tablet, Take 180 mg by mouth daily., Disp: , Rfl:  .  FLUoxetine (PROZAC) 20 MG capsule, Take 1 capsule (20 mg total) by mouth daily., Disp: 90 capsule, Rfl: 1 .  Glecaprevir-Pibrentasvir (MAVYRET) 100-40 MG TABS, Take 3 tablets by mouth daily., Disp: 84 tablet, Rfl:  2 .  megestrol (MEGACE) 40 MG tablet, TAKE ONE TABLET BY MOUTH 2 TIMES A DAY, Disp: 60 tablet, Rfl: 2 .  methocarbamol (ROBAXIN) 500 MG tablet, TAKE ONE TABLET BY MOUTH EVERY 8 HOURS AS NEEDED FOR MUSCLE SPASMS, Disp: 30 tablet, Rfl: 0 .  Multiple Vitamin (MULTI-VITAMINS) TABS, Take 1 tablet by mouth daily., Disp: 90 tablet, Rfl: 3 .  Multiple Vitamins-Minerals (HAIR SKIN AND NAILS FORMULA PO), Take by mouth., Disp: , Rfl:  .  pantoprazole (PROTONIX) 40 MG tablet, Take 1 tablet (40 mg total) by mouth daily., Disp: 90 tablet, Rfl: 1 .  polyethylene glycol (MIRALAX / GLYCOLAX) packet, Take 17 g by mouth daily., Disp: 90 each, Rfl: 3 .  ranitidine  (ZANTAC) 150 MG tablet, Take 150 mg 2 (two) times daily by mouth., Disp: , Rfl:  .  senna (SM SENNA LAXATIVE) 8.6 MG tablet, TAKE ONE TABLET BY MOUTH EVERY DAY AS NEEDED FOR CONSTIPATION, Disp: 30 tablet, Rfl: 0 .  VOLTAREN 1 % GEL, APPLY 2 GRAMS TOPICALLY FOUR TIMES A DAY, Disp: 100 g, Rfl: 0 Social History   Socioeconomic History  . Marital status: Single    Spouse name: Not on file  . Number of children: Not on file  . Years of education: Not on file  . Highest education level: Not on file  Occupational History  . Not on file  Social Needs  . Financial resource strain: Not on file  . Food insecurity:    Worry: Not on file    Inability: Not on file  . Transportation needs:    Medical: Not on file    Non-medical: Not on file  Tobacco Use  . Smoking status: Current Every Day Smoker    Packs/day: 1.00    Years: 35.00    Pack years: 35.00    Types: Cigarettes  . Smokeless tobacco: Never Used  . Tobacco comment: 1 pack every 2-3 days  Substance and Sexual Activity  . Alcohol use: Not Currently    Alcohol/week: 0.0 standard drinks  . Drug use: Not Currently    Types: Marijuana, Cocaine  . Sexual activity: Yes    Birth control/protection: Condom, None    Comment: given condoms  Lifestyle  . Physical activity:    Days per week: Not on file    Minutes per session: Not on file  . Stress: Not on file  Relationships  . Social connections:    Talks on phone: Not on file    Gets together: Not on file    Attends religious service: Not on file    Active member of club or organization: Not on file    Attends meetings of clubs or organizations: Not on file    Relationship status: Not on file  . Intimate partner violence:    Fear of current or ex partner: Not on file    Emotionally abused: Not on file    Physically abused: Not on file    Forced sexual activity: Not on file  Other Topics Concern  . Not on file  Social History Narrative  . Not on file   Family History    Problem Relation Age of Onset  . Diabetes Mother   . Hypertension Mother   . Stroke Mother        died 15-Oct-2013 . Cancer Sister   . Cancer Maternal Aunt     Objective: Office vital signs reviewed. BP 116/79   Pulse 84   Temp (!) 96.9 F (  36.1 C) (Oral)   Ht 4' 7.5" (1.41 m)   Wt 114 lb (51.7 kg)   BMI 26.02 kg/m   Physical Examination:  General: Awake, alert, No acute distress HEENT: Normal, sclera white, MMM, poor dentition Cardio: regular rate and rhythm, S1S2 heard, no murmurs appreciated Pulm: clear to auscultation bilaterally, no wheezes, rhonchi or rales; normal work of breathing on room air Extremities: warm, well perfused, No edema, cyanosis or clubbing; +2 pulses bilaterally; bilateral feet with thickened, onychomycotic great toenails  Lab Results  Component Value Date   HGBA1C 5.3 08/08/2017    Assessment/ Plan: 53 y.o. female   1. Hypertension associated with diabetes ([place]) Under good control Norvasc.  This has been refilled.  Check microalbumin given type 2 diabetes. - Microalbumin / creatinine urine ratio  2. Type 2 diabetes mellitus without complication, without long-term current use of insulin (HCC) Under excellent control without medication.  A1c was less than 5 today. - Bayer DCA Hb A1c Waived - Microalbumin / creatinine urine ratio  3. Chronic hepatitis C without hepatic coma (HCC) Under the care of infectious disease.  Stable.  LFTs within normal limits on last panel.  4. Human immunodeficiency virus (HIV) disease ([place]) Under the care of infectious disease.  5. Pulmonary emphysema, unspecified emphysema type (HCC) - albuterol (PROVENTIL HFA) 108 (90 Base) MCG/ACT inhaler; Inhale 2 puffs into the lungs every 6 (six) hours as needed for wheezing or shortness of breath.  Dispense: 6.7 g; Refill: 1  7. Allergic rhinitis, unspecified seasonality, unspecified trigger Uncontrolled. Needs refills.  Flonase also prescribed - cetirizine (ZYRTEC) 10  MG tablet; Take 1 tablet (10 mg total) by mouth daily.  Dispense: 90 tablet; Refill: 3  8. Depression, recurrent ([place]) Controlled. - FLUoxetine (PROZAC) 20 MG capsule; Take 1 capsule (20 mg total) by mouth daily.  Dispense: 90 capsule; Refill: 1  9. Chronic bilateral low back pain without sciatica Controlled. - methocarbamol (ROBAXIN) 500 MG tablet; TAKE ONE TABLET BY MOUTH EVERY 8 HOURS AS NEEDED FOR MUSCLE SPASMS  Dispense: 30 tablet; Refill: 0  10. Gastroesophageal reflux disease, esophagitis presence not specified Controlled. - pantoprazole (PROTONIX) 40 MG tablet; Take 1 tablet (40 mg total) by mouth daily.  Dispense: 90 tablet; Refill: 1 - Ambulatory referral to Gastroenterology  11. Constipation, unspecified constipation type - polyethylene glycol (MIRALAX / GLYCOLAX) packet; Take 17 g by mouth daily.  Dispense: 90 each; Refill: 3 - Ambulatory referral to Gastroenterology  12. Rectal bleed - Ambulatory referral to Gastroenterology   Orders Placed This Encounter  Procedures  . Bayer DCA Hb A1c Waived  . Microalbumin / creatinine urine ratio   Meds ordered this encounter  Medications  . albuterol (PROVENTIL HFA) 108 (90 Base) MCG/ACT inhaler    Sig: Inhale 2 puffs into the lungs every 6 (six) hours as needed for wheezing or shortness of breath.    Dispense:  6.7 g    Refill:  1  . amLODipine (NORVASC) 10 MG tablet    Sig: Take 1 tablet (10 mg total) by mouth at bedtime.    Dispense:  90 tablet    Refill:  3  . cetirizine (ZYRTEC) 10 MG tablet    Sig: Take 1 tablet (10 mg total) by mouth daily.    Dispense:  90 tablet    Refill:  3  . fluticasone (FLONASE) 50 MCG/ACT nasal spray    Sig: Place 2 sprays into both nostrils daily.    Dispense:  16 g  Refill:  6  . FLUoxetine (PROZAC) 20 MG capsule    Sig: Take 1 capsule (20 mg total) by mouth daily.    Dispense:  90 capsule    Refill:  1  . methocarbamol (ROBAXIN) 500 MG tablet    Sig: TAKE ONE TABLET BY MOUTH  EVERY 8 HOURS AS NEEDED FOR MUSCLE SPASMS    Dispense:  30 tablet    Refill:  0  . pantoprazole (PROTONIX) 40 MG tablet    Sig: Take 1 tablet (40 mg total) by mouth daily.    Dispense:  90 tablet    Refill:  1  . polyethylene glycol (MIRALAX / GLYCOLAX) packet    Sig: Take 17 g by mouth daily.    Dispense:  90 each    Refill:  3  . senna (SM SENNA LAXATIVE) 8.6 MG tablet    Sig: TAKE ONE TABLET BY MOUTH EVERY DAY AS NEEDED FOR CONSTIPATION    Dispense:  30 tablet    Refill:  0     Ashley Simmering Windell Moulding, DO [place] [phone]

## 2018-03-26 NOTE — Patient Instructions (Addendum)
Please make sure that you arrive 15 minutes prior to your scheduled appointment.  I may not be able to accommodate a late arrival in the future and you will be asked to reschedule.  You had labs performed today.  You will be contacted with the results of the labs once they are available, usually in the next 3 business days for routine lab work.   Your medications have been sent.  I sent in a nasal spray to help with allergies.  Allergic Rhinitis, Adult Allergic rhinitis is a reaction to allergens in the air. Allergens are tiny specks (particles) in the air that cause your body to have an allergic reaction. This condition cannot be passed from person to person (is not contagious). Allergic rhinitis cannot be cured, but it can be controlled. There are two types of allergic rhinitis:  Seasonal. This type is also called hay fever. It happens only during certain times of the year.  Perennial. This type can happen at any time of the year. What are the causes? This condition may be caused by:  Pollen from grasses, trees, and weeds.  House dust mites.  Pet dander.  Mold. What are the signs or symptoms? Symptoms of this condition include:  Sneezing.  Runny or stuffy nose (nasal congestion).  A lot of mucus in the back of the throat (postnasal drip).  Itchy nose.  Tearing of the eyes.  Trouble sleeping.  Being sleepy during day. How is this treated? There is no cure for this condition. You should avoid things that trigger your symptoms (allergens). Treatment can help to relieve symptoms. This may include:  Medicines that block allergy symptoms, such as antihistamines. These may be given as a shot, nasal spray, or pill.  Shots that are given until your body becomes less sensitive to the allergen (desensitization).  Stronger medicines, if all other treatments have not worked. Follow these instructions at home: Avoiding allergens   Find out what you are allergic to. Common  allergens include smoke, dust, and pollen.  Avoid them if you can. These are some of the things that you can do to avoid allergens: ? Replace carpet with wood, tile, or vinyl flooring. Carpet can trap dander and dust. ? Clean any mold found in the home. ? Do not smoke. Do not allow smoking in your home. ? Change your heating and air conditioning filter at least once a month. ? During allergy season:  Keep windows closed as much as you can. If possible, use air conditioning when there is a lot of pollen in the air.  Use a special filter for allergies with your furnace and air conditioner.  Plan outdoor activities when pollen counts are lowest. This is usually during the early morning or evening hours.  If you do go outdoors when pollen count is high, wear a special mask for people with allergies.  When you come indoors, take a shower and change your clothes before sitting on furniture or bedding. General instructions  Do not use fans in your home.  Do not hang clothes outside to dry.  Wear sunglasses to keep pollen out of your eyes.  Wash your hands right away after you touch household pets.  Take over-the-counter and prescription medicines only as told by your doctor.  Keep all follow-up visits as told by your doctor. This is important. Contact a doctor if:  You have a fever.  You have a cough that does not go away (is persistent).  You start to make  whistling sounds when you breathe (wheeze).  Your symptoms do not get better with treatment.  You have thick fluid coming from your nose.  You start to have nosebleeds. Get help right away if:  Your tongue or your lips are swollen.  You have trouble breathing.  You feel dizzy or you feel like you are going to pass out (faint).  You have cold sweats. Summary  Allergic rhinitis is a reaction to allergens in the air.  This condition may be caused by allergens. These include pollen, dust mites, pet dander, and  mold.  Symptoms include a runny, itchy nose, sneezing, or tearing eyes. You may also have trouble sleeping or feel sleepy during the day.  Treatment includes taking medicines and avoiding allergens. You may also get shots or take stronger medicines.  Get help if you have a fever or a cough that does not stop. Get help right away if you are short of breath. This information is not intended to replace advice given to you by your health care provider. Make sure you discuss any questions you have with your health care provider. Document Released: 06/15/2010 Document Revised: 09/04/2017 Document Reviewed: 09/04/2017 Elsevier Interactive Patient Education  2019 [employer].

## 2018-03-26 NOTE — Progress Notes (Signed)
HPI: Ashley [name] is a 53 y.o. female presenting for HIV and HCV follow up. She has genotype 1a, F2/F3 and completed 8 weeks of Mavyret in November 2019.  Patient Active Problem List   Diagnosis Date Noted  . Need for prophylactic vaccination against Streptococcus pneumoniae (pneumococcus) 10/17/2017  . Depressive disorder 10/17/2017  . Encounter for screening colonoscopy 01/15/2017  . Umbilical hernia without obstruction and without gangrene 12/11/2016  . Pulmonary emphysema ([place]) 12/17/2015  . Liver fibrosis 11/11/2015  . Screening examination for venereal disease 06/24/2015  . Encounter for long-term (current) use of medications 06/24/2015  . Substance abuse ([place]) 06/24/2015  . Atypical squamous cells of undetermined significance (ASCUS) on Papanicolaou smear of cervix on 03/26/15 04/02/2015  . MDD (major depressive disorder), recurrent, severe, with psychosis ([place]) 04/30/2014  . Elevated LFTs 01/13/2012  . Tobacco use 01/13/2012  . Tuberculosis 12/19/2011  . Cigarette smoker 12/19/2011  . Hereditary and idiopathic peripheral neuropathy 06/16/2009  . Hypertension associated with diabetes ([place]) 03/19/2009  . ABDOMINAL WALL HERNIA 03/19/2009  . BACK PAIN, CHRONIC 03/19/2009  . Human immunodeficiency virus (HIV) disease ([place]) 03/18/2009  . Hepatitis C virus infection without hepatic coma 03/18/2009  . Type 2 diabetes mellitus ([place]) 03/18/2009  . Schizophrenia ([place]) 03/18/2009  . BIPOLAR AFFECTIVE DISORDER, DEPRESSED, SEVERE 03/18/2009    Patient's Medications  New Prescriptions   No medications on file  Previous Medications   ACETAMINOPHEN (TYLENOL) 325 MG TABLET    Take 650 mg by mouth every 6 (six) hours as needed for mild pain. Reported on 06/16/2015   ALBUTEROL (PROVENTIL HFA) 108 (90 BASE) MCG/ACT INHALER    Inhale 2 puffs into the lungs every 6 (six) hours as needed for wheezing or shortness of breath. (Needs to be seen before next refill)   AMLODIPINE (NORVASC) 10 MG  TABLET    TAKE ONE TABLET BY MOUTH DAILY AT BEDTIME   BLACK COHOSH 40 MG CAPS    Take by mouth.   CETIRIZINE (ZYRTEC) 10 MG TABLET    TAKE ONE TABLET BY MOUTH EVERY DAY   DESCOVY 200-25 MG TABLET    TAKE ONE TABLET BY MOUTH DAILY. MAKE APPOINTMENT FOR FURTHER REFILLS   FEXOFENADINE (ALLEGRA) 180 MG TABLET    Take 180 mg by mouth daily.   FLUOXETINE (PROZAC) 20 MG CAPSULE    Take 1 capsule (20 mg total) by mouth daily.   GLECAPREVIR-PIBRENTASVIR (MAVYRET) 100-40 MG TABS    Take 3 tablets by mouth daily.   MEGESTROL (MEGACE) 40 MG TABLET    TAKE ONE TABLET BY MOUTH 2 TIMES A DAY   METHOCARBAMOL (ROBAXIN) 500 MG TABLET    TAKE ONE TABLET BY MOUTH EVERY 8 HOURS AS NEEDED FOR MUSCLE SPASMS   MULTIPLE VITAMIN (MULTI-VITAMINS) TABS    Take 1 tablet by mouth daily.   MULTIPLE VITAMINS-MINERALS (HAIR SKIN AND NAILS FORMULA PO)    Take by mouth.   PANTOPRAZOLE (PROTONIX) 40 MG TABLET    Take 1 tablet (40 mg total) by mouth daily.   POLYETHYLENE GLYCOL (MIRALAX / GLYCOLAX) PACKET    Take 17 g by mouth daily.   RANITIDINE (ZANTAC) 150 MG TABLET    Take 150 mg 2 (two) times daily by mouth.   SENNA (SM SENNA LAXATIVE) 8.6 MG TABLET    TAKE ONE TABLET BY MOUTH EVERY DAY AS NEEDED FOR CONSTIPATION   TIVICAY 50 MG TABLET    TAKE ONE TABLET BY MOUTH DAILY . MAKE APPOINTMENT FOR FURTHER REFILLS  VOLTAREN 1 % GEL    APPLY 2 GRAMS TOPICALLY FOUR TIMES A DAY  Modified Medications   No medications on file  Discontinued Medications   No medications on file    Allergies: Allergies  Allergen Reactions  . Aspirin Other (See Comments)    Liver problems    Past Medical History: Past Medical History:  Diagnosis Date  . Ankle fracture   . Anxiety   . Asthma   . Collagen vascular disease ([place])   . Depression   . Diabetes mellitus   . Hepatitis C   . Hernia   . HIV (human immunodeficiency virus infection) ([place])   . HIV (human immunodeficiency virus infection) ([place])   . Hypertension   . Pinched nerve   .  Scoliosis   . TB (tuberculosis)     Social History: Social History   Socioeconomic History  . Marital status: Single    Spouse name: Not on file  . Number of children: Not on file  . Years of education: Not on file  . Highest education level: Not on file  Occupational History  . Not on file  Social Needs  . Financial resource strain: Not on file  . Food insecurity:    Worry: Not on file    Inability: Not on file  . Transportation needs:    Medical: Not on file    Non-medical: Not on file  Tobacco Use  . Smoking status: Current Every Day Smoker    Packs/day: 1.00    Years: 35.00    Pack years: 35.00    Types: Cigarettes  . Smokeless tobacco: Never Used  . Tobacco comment: 1 pack every 2-3 days  Substance and Sexual Activity  . Alcohol use: Not Currently    Alcohol/week: 0.0 standard drinks  . Drug use: Not Currently    Types: Marijuana, Cocaine  . Sexual activity: Yes    Birth control/protection: Condom, None    Comment: given condoms  Lifestyle  . Physical activity:    Days per week: Not on file    Minutes per session: Not on file  . Stress: Not on file  Relationships  . Social connections:    Talks on phone: Not on file    Gets together: Not on file    Attends religious service: Not on file    Active member of club or organization: Not on file    Attends meetings of clubs or organizations: Not on file    Relationship status: Not on file  Other Topics Concern  . Not on file  Social History Narrative  . Not on file    Labs: Lab Results  Component Value Date   HIV1RNAQUANT 53 (H) 10/17/2017   HIV1RNAQUANT <20 DETECTED (A) 08/15/2017   HIV1RNAQUANT 33 (H) 07/27/2016   CD4TABS 740 10/17/2017   CD4TABS 710 08/15/2017   CD4TABS 1,090 07/27/2016    RPR and STI Lab Results  Component Value Date   LABRPR Non Reactive 08/08/2017   LABRPR NON REAC 04/03/2016   LABRPR NON REAC 06/24/2015   LABRPR Non Reactive 05/01/2014   LABRPR NON REAC 06/18/2013     STI Results GC CT  06/24/2015 Negative Negative  05/28/2015 Negative Negative  02/15/2015 Negative Negative  05/01/2014 NG: Negative CT: Negative  06/18/2013 NG: Negative CT: Negative  12/22/2011 - NEGATIVE   Labs: Hepatitis C Lab Results  Component Value Date   HCVGENOTYPE 1a 10/17/2017   HCVRNAPCRQN <15 NOT DETECTED 01/30/2018   HCVRNAPCRQN 2,460,000 (H) 10/17/2017  FIBROSTAGE F1-F2 07/27/2016   Hepatitis B Lab Results  Component Value Date   HEPBSAB POS (A) 07/27/2016   HEPBSAG NEGATIVE 07/27/2016   HEPBCAB NON REACTIVE 07/27/2016   Hepatitis A Lab Results  Component Value Date   HAV REACTIVE (A) 07/27/2016   Lab Results  Component Value Date   AST 32 01/30/2018   AST 73 (H) 10/17/2017   AST 63 (H) 08/08/2017   ALT 18 01/30/2018   ALT 74 (H) 10/17/2017   ALT 57 (H) 08/08/2017   INR 1.0 07/27/2016   INR 1.00 05/01/2014   INR 1.0 10/28/2008   Lipids: Lab Results  Component Value Date   CHOL 165 08/08/2017   TRIG 107 08/08/2017   HDL 66 08/08/2017   CHOLHDL 2.5 08/08/2017   VLDL 16 06/24/2015   LDLCALC 78 08/08/2017    Current HIV Regimen: Tivicay + Descovy  Assessment: Ashley [name] is here for HIV and HCV follow up. She has missed several previous appointments due to transportation issues. She was previously on Tivicay and Descovy but says that she has been out since December. However, her last refill on these medications was for only 30 days in August. Ashley [name] says that she would sometimes forget to take both pills together and take only one at a time. We will switch to Biktarvy to simplify her regimen. Counseled on appropriate administration, potential side effects, and drug interactions, including instruction to separate Biktarvy and multivitamins by at least 2-3 hours.  Ashley [name] completed 8 weeks of Mavyret in November 2019. She confirms that she took all 3 tablets at once every day and did not miss any doses. She had labs drawn in December but missed the follow  up appointment with Dr. Linus Salmons. Her HCV viral load was undetectable and LFTs WNL at that time. We will check her HCV viral load again today for cure.  Plan: Discontinue Tivicay and Descovy Start Biktarvy HIV RNA and CD4 HCV RNA Follow up with Dr. Linus Salmons on 3/24 at [facility]. Gerarda Fraction, PharmD, [place] PGY2 Infectious Diseases Pharmacy Resident Phone: [phone] 03/26/2018, 9:55 AM

## 2018-03-27 LAB — MICROALBUMIN / CREATININE URINE RATIO
Creatinine, Urine: 150.8 mg/dL
Microalb/Creat Ratio: 8 mg/g creat (ref 0–29)
Microalbumin, Urine: 12.8 ug/mL

## 2018-03-27 LAB — T-HELPER CELL (CD4) - (RCID CLINIC ONLY)
CD4 % Helper T Cell: 23 % — ABNORMAL LOW (ref 33–55)
CD4 T Cell Abs: 400 /uL (ref 400–2700)

## 2018-04-03 ENCOUNTER — Ambulatory Visit (INDEPENDENT_AMBULATORY_CARE_PROVIDER_SITE_OTHER): Payer: Self-pay | Admitting: Internal Medicine

## 2018-04-04 LAB — HIV-1 INTEGRASE GENOTYPE

## 2018-04-04 LAB — HEPATITIS C RNA QUANTITATIVE
HCV Quantitative Log: <1.18 Log IU/mL
HCV RNA, PCR, QN: <15 IU/mL

## 2018-04-04 LAB — HIV-1 GENOTYPE: HIV-1 Genotype: DETECTED — AB

## 2018-04-04 LAB — HIV RNA, RTPCR W/R GT (RTI, PI,INT)
HIV 1 RNA Quant: 79700 copies/mL — ABNORMAL HIGH
HIV-1 RNA Quant, Log: 4.9 Log copies/mL — ABNORMAL HIGH

## 2018-04-09 ENCOUNTER — Ambulatory Visit (INDEPENDENT_AMBULATORY_CARE_PROVIDER_SITE_OTHER): Payer: Medicaid Other | Admitting: Internal Medicine

## 2018-04-09 ENCOUNTER — Encounter (INDEPENDENT_AMBULATORY_CARE_PROVIDER_SITE_OTHER): Payer: Self-pay | Admitting: *Deleted

## 2018-04-09 ENCOUNTER — Telehealth (INDEPENDENT_AMBULATORY_CARE_PROVIDER_SITE_OTHER): Payer: Self-pay | Admitting: *Deleted

## 2018-04-09 ENCOUNTER — Encounter (INDEPENDENT_AMBULATORY_CARE_PROVIDER_SITE_OTHER): Payer: Self-pay | Admitting: Internal Medicine

## 2018-04-09 VITALS — BP 129/70 | HR 95 | Temp 98.5°F | Ht <= 58 in | Wt 117.0 lb

## 2018-04-09 DIAGNOSIS — K625 Hemorrhage of anus and rectum: Secondary | ICD-10-CM | POA: Diagnosis not present

## 2018-04-09 LAB — CBC WITH DIFFERENTIAL/PLATELET
ABSOLUTE MONOCYTES: 503 {cells}/uL (ref 200–950)
Basophils Absolute: 19 cells/uL (ref 0–200)
Basophils Relative: 0.4 %
Eosinophils Absolute: 71 cells/uL (ref 15–500)
Eosinophils Relative: 1.5 %
HEMATOCRIT: 33.2 % — AB (ref 35.0–45.0)
Hemoglobin: 11.5 g/dL — ABNORMAL LOW (ref 11.7–15.5)
Lymphs Abs: 2059 cells/uL (ref 850–3900)
MCH: 33 pg (ref 27.0–33.0)
MCHC: 34.6 g/dL (ref 32.0–36.0)
MCV: 95.4 fL (ref 80.0–100.0)
MPV: 10.1 fL (ref 7.5–12.5)
Monocytes Relative: 10.7 %
Neutro Abs: 2049 cells/uL (ref 1500–7800)
Neutrophils Relative %: 43.6 %
Platelets: 162 10*3/uL (ref 140–400)
RBC: 3.48 10*6/uL — ABNORMAL LOW (ref 3.80–5.10)
RDW: 13.2 % (ref 11.0–15.0)
Total Lymphocyte: 43.8 %
WBC: 4.7 10*3/uL (ref 3.8–10.8)

## 2018-04-09 MED ORDER — SUPREP BOWEL PREP KIT 17.5-3.13-1.6 GM/177ML PO SOLN: 1.0000 | Freq: Once | ORAL | 0 refills | Status: AC

## 2018-04-09 NOTE — Telephone Encounter (Signed)
Patient needs suprep 

## 2018-04-09 NOTE — Patient Instructions (Signed)
The risks of bleeding, perforation and infection were reviewed with patient.  

## 2018-04-09 NOTE — Progress Notes (Signed)
Subjective:    Patient ID: Ashley [name], female    DOB: [birth year], 53 y.o.   MRN: [id number]  HPI Here today with symptoms of GERD. Also c/o constipation. She states she has been having some acid reflux. States occurs everyday. She takes Protonix for the acid reflux at night. She says the Protonix controls her GERD. If she misses a does, she will notice GERD. She is avoiding hot and spicy foods. She says she is allergic to tomatoes.  She tells me a couple of weeks ago she had some dark blood from her rectum. Occurred x 2. She  She says she had a "sore on her rectum". Place Monostat on the area and cleared. She is having a BM daily. She states she is not constipated. She states she takes medication for her constipation (did not bring medications).   She really has no GI complaints except for the dark blood a couple of weeks ago.  She was scheduled for a colonoscopy in 2018 but I do not see it in the system.  Her appetite is okay. No weight loss.    Hx of HIV. Treated at [facility] infectious disease for Hep C. Hep C RNA not detected 2 weeks ago. Treated with Mavyret x 8 weeks.  Hx of Type 2 Diabetes since 1995 (Diet controlled). CBC    Component Value Date/Time   WBC 3.9 10/17/2017 1031   RBC 4.11 10/17/2017 1031   HGB 14.0 10/17/2017 1031   HGB 12.3 08/08/2017 1323   HCT 41.4 10/17/2017 1031   HCT 37.9 08/08/2017 1323   PLT 141 10/17/2017 1031   PLT 134 (L) 08/08/2017 1323   MCV 100.7 (H) 10/17/2017 1031   MCV 100 (H) 08/08/2017 1323   MCV 94 10/16/2013 0553   MCH 34.1 (H) 10/17/2017 1031   MCHC 33.8 10/17/2017 1031   RDW 12.6 10/17/2017 1031   RDW 14.8 08/08/2017 1323   RDW 14.4 10/16/2013 0553   LYMPHSABS 2,071 10/17/2017 1031   LYMPHSABS 2.3 08/08/2017 1323   LYMPHSABS 0.9 (L) 10/16/2013 0553   MONOABS 0.5 11/21/2016 0102   MONOABS 0.8 10/16/2013 0553   EOSABS 160 10/17/2017 1031   EOSABS 0.1 08/08/2017 1323   EOSABS 0.0 10/16/2013 0553   BASOSABS 39 10/17/2017 1031     BASOSABS 0.0 08/08/2017 1323   BASOSABS 0.0 10/16/2013 0553     Review of Systems Past Medical History:  Diagnosis Date  . Ankle fracture   . Anxiety   . Asthma   . Collagen vascular disease ([place])   . Depression   . Diabetes mellitus   . Elevated LFTs 01/13/2012  . Hepatitis C   . Hernia   . HIV (human immunodeficiency virus infection) ([place])   . HIV (human immunodeficiency virus infection) ([place])   . Hypertension   . Pinched nerve   . Scoliosis   . TB (tuberculosis)     Past Surgical History:  Procedure Laterality Date  . CESAREAN SECTION    . CHOLECYSTECTOMY    . HERNIA REPAIR    . LIVER BIOPSY      Allergies  Allergen Reactions  . Aspirin Other (See Comments)    Liver problems    Current Outpatient Medications on File Prior to Visit  Medication Sig Dispense Refill  . albuterol (PROVENTIL HFA) 108 (90 Base) MCG/ACT inhaler Inhale 2 puffs into the lungs every 6 (six) hours as needed for wheezing or shortness of breath. 6.7 g 1  . amLODipine (NORVASC) 10  MG tablet Take 1 tablet (10 mg total) by mouth at bedtime. 90 tablet 3  . bictegravir-emtricitabine-tenofovir AF (BIKTARVY) 50-200-25 MG TABS tablet Take 1 tablet by mouth daily. 30 tablet 3  . cetirizine (ZYRTEC) 10 MG tablet Take 1 tablet (10 mg total) by mouth daily. 90 tablet 3  . FLUoxetine (PROZAC) 20 MG capsule Take 1 capsule (20 mg total) by mouth daily. 90 capsule 1  . fluticasone (FLONASE) 50 MCG/ACT nasal spray Place 2 sprays into both nostrils daily. 16 g 6  . Multiple Vitamin (MULTI-VITAMINS) TABS Take 1 tablet by mouth daily. 90 tablet 3  . pantoprazole (PROTONIX) 40 MG tablet Take 40 mg by mouth daily.     No current facility-administered medications on file prior to visit.         Objective:   Physical Exam Blood pressure 129/70, pulse 95, temperature 98.5 F (36.9 C), height 4\' 7"  (1.397 m), weight 117 lb (53.1 kg). Alert and oriented. Skin warm and dry. Oral mucosa is moist.   . Sclera  anicteric, conjunctivae is pink. Thyroid not enlarged. No cervical lymphadenopathy. Lungs clear. Heart regular rate and rhythm.  Abdomen is soft. Bowel sounds are positive. No hepatomegaly. No abdominal masses felt. No tenderness.  No edema to lower extremities.  Rectal exam: No masses, guaiac negative.         Assessment & Plan:  Rectal bleeding, resolved. Needs colonoscopy to rule out colonic neoplasm. CBC today.  GERD: She will continue the Protonix.

## 2018-04-11 ENCOUNTER — Other Ambulatory Visit: Payer: Self-pay | Admitting: Family Medicine

## 2018-04-11 DIAGNOSIS — M545 Low back pain, unspecified: Secondary | ICD-10-CM

## 2018-04-11 DIAGNOSIS — G8929 Other chronic pain: Secondary | ICD-10-CM

## 2018-04-11 NOTE — Telephone Encounter (Signed)
Last seen 03/26/18

## 2018-04-24 ENCOUNTER — Other Ambulatory Visit (HOSPITAL_COMMUNITY)
Admission: RE | Admit: 2018-04-24 | Discharge: 2018-04-24 | Disposition: A | Discharge status: Home / Self Care | Payer: Medicaid Other | Source: Ambulatory Visit | Attending: Obstetrics and Gynecology | Admitting: Obstetrics and Gynecology

## 2018-04-24 ENCOUNTER — Ambulatory Visit: Payer: Medicaid Other | Admitting: Obstetrics and Gynecology

## 2018-04-24 ENCOUNTER — Encounter: Payer: Self-pay | Admitting: Obstetrics and Gynecology

## 2018-04-24 VITALS — BP 138/98 | HR 93 | Wt 113.5 lb

## 2018-04-24 DIAGNOSIS — R87610 Atypical squamous cells of undetermined significance on cytologic smear of cervix (ASC-US): Secondary | ICD-10-CM | POA: Insufficient documentation

## 2018-04-24 DIAGNOSIS — N898 Other specified noninflammatory disorders of vagina: Secondary | ICD-10-CM | POA: Insufficient documentation

## 2018-04-24 NOTE — Progress Notes (Signed)
Ashley [name] presents with a H/O abnormal pap smear from June 2019. ASCUS + HPV. First abnormal pap smear H/O trich in the past No sexual active at present Postmenopausal  PE AF VSS Lungs clear  Heart RRR Abd soft + BS GI Nl EGBUS vaginal mucosa atrophic, cervix fraible  A/P ASCUS HPV + June pap smear  Abnormal pap smears and HPV reviewed with pt. Repeat pap smear completed today. F/U per pap smear results.

## 2018-04-24 NOTE — Progress Notes (Signed)
Pt presents to f/u pap smear ASCUS +HPV 07/2017. Pt c/o bloody stools. Colonoscopy scheduled next month.  MGM due

## 2018-04-24 NOTE — Patient Instructions (Signed)
Human Papillomavirus  Human papillomavirus (HPV) is the most common sexually transmitted infection (STI). It easily spreads from person to person (is contagious). HPV can cause genital warts and some cancers. The genital warts can be seen and felt. Also, there may be wartlike areas in the throat. HPV may not have any symptoms. It is possible to have HPV for a long time and not know it. You may spread HPV on to others without knowing it.  Follow these instructions at home:  Medicines  · Take over-the-counter and prescription medicines only as told by your doctor. This include creams for itching or irritation.  · Do not treat genital warts with medicines for hand warts.  How is this prevented?  · Talk with your doctor about getting the HPV shots (vaccines). Males and females between ages 9 and 26 should get the shots. The shots will not work if you already have HPV. Pregnant women should not get the shots.  · After treatment, use condoms during sex. This helps to prevent future infections.  · Have only one sex partner.  · Have a sex partner who does not have other sex partners.  · Get Pap tests as told by your doctor.  General instructions  · Do not touch or scratch warts.  · Do not have sex while you are being treated.  · Do not douche or use tampons during treatment.  · Tell your sex partner about your infection. He or she may also need to be treated.  · If you get pregnant, tell your doctor that you have HPV. Your doctor will monitor you during pregnancy.  · Keep all follow-up visits as told by your doctor. This is important.  Contact a doctor if:  · The treated skin is red, swollen, or painful.  · You have a fever.  · You feel ill.  · You feel lumps or pimples in or around your genital area.  · You have bleeding from the vagina.  · You have bleeding from the area that was treated.  · You have pain during sex.  Summary  · Human papillomavirus (HPV) is the most common sexually transmitted infection (STI). It easily  spreads from person to person.  · Talk with your doctor about getting the HPV shots (vaccines). Males and females between ages 9 and 26 should get the shots.  · HPV may not have any symptoms.  · Use creams for itching or irritation only as told by your doctor.  This information is not intended to replace advice given to you by your health care provider. Make sure you discuss any questions you have with your health care provider.  Document Released: 01/27/2008 Document Revised: 03/27/2016 Document Reviewed: 03/27/2016  Elsevier Interactive Patient Education © 2019 [employer]vier Inc.

## 2018-04-25 LAB — CERVICOVAGINAL ANCILLARY ONLY
Bacterial vaginitis: POSITIVE — AB
CHLAMYDIA, DNA PROBE: NEGATIVE
Candida vaginitis: NEGATIVE
Neisseria Gonorrhea: NEGATIVE
TRICH (WINDOWPATH): NEGATIVE

## 2018-04-26 LAB — CYTOLOGY - PAP: HPV: NOT DETECTED

## 2018-04-26 LAB — HIV ANTIBODY (ROUTINE TESTING W REFLEX): HIV Screen 4th Generation wRfx: REACTIVE — AB

## 2018-04-26 LAB — HEPATITIS B SURFACE ANTIGEN: Hepatitis B Surface Ag: NEGATIVE

## 2018-04-26 LAB — HEPATITIS C ANTIBODY: Hep C Virus Ab: >11 s/co ratio — ABNORMAL HIGH (ref 0.0–0.9)

## 2018-04-26 LAB — HIV 1/2 AB DIFFERENTIATION
HIV 1 Ab: POSITIVE — AB
HIV 2 Ab: NEGATIVE
NOTE (HIV CONF MULTISPOT): POSITIVE

## 2018-04-26 LAB — RPR: RPR Ser Ql: NONREACTIVE

## 2018-05-02 ENCOUNTER — Other Ambulatory Visit: Payer: Self-pay

## 2018-05-02 DIAGNOSIS — N760 Acute vaginitis: Principal | ICD-10-CM

## 2018-05-02 DIAGNOSIS — B9689 Other specified bacterial agents as the cause of diseases classified elsewhere: Secondary | ICD-10-CM

## 2018-05-02 MED ORDER — METRONIDAZOLE 500 MG PO TABS: 500.0000 mg | ORAL_TABLET | Freq: Two times a day (BID) | ORAL | 0 refills | Status: DC

## 2018-05-02 NOTE — Progress Notes (Signed)
Rx sent as directed by provider. Pt notified of all results  Info given to front staff to make colpo appt.

## 2018-05-08 ENCOUNTER — Encounter (HOSPITAL_COMMUNITY)
Admission: RE | Disposition: A | Discharge status: Home / Self Care | Payer: Self-pay | Source: Home / Self Care | Attending: Internal Medicine

## 2018-05-08 ENCOUNTER — Other Ambulatory Visit: Payer: Self-pay | Admitting: Family Medicine

## 2018-05-08 ENCOUNTER — Encounter (HOSPITAL_COMMUNITY): Payer: Self-pay | Admitting: *Deleted

## 2018-05-08 ENCOUNTER — Other Ambulatory Visit: Payer: Self-pay

## 2018-05-08 ENCOUNTER — Ambulatory Visit (HOSPITAL_COMMUNITY)
Admission: RE | Admit: 2018-05-08 | Discharge: 2018-05-08 | Disposition: A | Discharge status: Home / Self Care | Payer: Medicaid Other | Attending: Internal Medicine | Admitting: Internal Medicine

## 2018-05-08 DIAGNOSIS — Z79899 Other long term (current) drug therapy: Secondary | ICD-10-CM | POA: Insufficient documentation

## 2018-05-08 DIAGNOSIS — K644 Residual hemorrhoidal skin tags: Secondary | ICD-10-CM | POA: Insufficient documentation

## 2018-05-08 DIAGNOSIS — E119 Type 2 diabetes mellitus without complications: Secondary | ICD-10-CM | POA: Diagnosis not present

## 2018-05-08 DIAGNOSIS — J45909 Unspecified asthma, uncomplicated: Secondary | ICD-10-CM | POA: Diagnosis not present

## 2018-05-08 DIAGNOSIS — F419 Anxiety disorder, unspecified: Secondary | ICD-10-CM | POA: Diagnosis not present

## 2018-05-08 DIAGNOSIS — Z886 Allergy status to analgesic agent status: Secondary | ICD-10-CM | POA: Diagnosis not present

## 2018-05-08 DIAGNOSIS — B1920 Unspecified viral hepatitis C without hepatic coma: Secondary | ICD-10-CM | POA: Insufficient documentation

## 2018-05-08 DIAGNOSIS — R1030 Lower abdominal pain, unspecified: Secondary | ICD-10-CM | POA: Insufficient documentation

## 2018-05-08 DIAGNOSIS — I10 Essential (primary) hypertension: Secondary | ICD-10-CM | POA: Insufficient documentation

## 2018-05-08 DIAGNOSIS — K5900 Constipation, unspecified: Secondary | ICD-10-CM | POA: Insufficient documentation

## 2018-05-08 DIAGNOSIS — Z8249 Family history of ischemic heart disease and other diseases of the circulatory system: Secondary | ICD-10-CM | POA: Insufficient documentation

## 2018-05-08 DIAGNOSIS — K6289 Other specified diseases of anus and rectum: Secondary | ICD-10-CM

## 2018-05-08 DIAGNOSIS — Z833 Family history of diabetes mellitus: Secondary | ICD-10-CM | POA: Insufficient documentation

## 2018-05-08 DIAGNOSIS — F329 Major depressive disorder, single episode, unspecified: Secondary | ICD-10-CM | POA: Insufficient documentation

## 2018-05-08 DIAGNOSIS — K625 Hemorrhage of anus and rectum: Secondary | ICD-10-CM | POA: Insufficient documentation

## 2018-05-08 DIAGNOSIS — M545 Low back pain: Principal | ICD-10-CM

## 2018-05-08 DIAGNOSIS — D123 Benign neoplasm of transverse colon: Secondary | ICD-10-CM | POA: Insufficient documentation

## 2018-05-08 DIAGNOSIS — B20 Human immunodeficiency virus [HIV] disease: Secondary | ICD-10-CM | POA: Insufficient documentation

## 2018-05-08 DIAGNOSIS — G8929 Other chronic pain: Secondary | ICD-10-CM

## 2018-05-08 DIAGNOSIS — F17210 Nicotine dependence, cigarettes, uncomplicated: Secondary | ICD-10-CM | POA: Insufficient documentation

## 2018-05-08 HISTORY — PX: BIOPSY: SHX5522

## 2018-05-08 HISTORY — PX: COLONOSCOPY: SHX5424

## 2018-05-08 LAB — GLUCOSE, CAPILLARY: Glucose-Capillary: 90 mg/dL (ref 70–99)

## 2018-05-08 SURGERY — COLONOSCOPY
Anesthesia: Moderate Sedation

## 2018-05-08 MED ORDER — STERILE WATER FOR IRRIGATION IR SOLN
Status: DC | PRN
  Administered 2018-05-08: 1.5 mL

## 2018-05-08 MED ORDER — MEPERIDINE HCL 50 MG/ML IJ SOLN
INTRAMUSCULAR | Status: DC | PRN
  Administered 2018-05-08: 20 mg via INTRAVENOUS
  Administered 2018-05-08: 10 mg via INTRAVENOUS
  Administered 2018-05-08: 20 mg via INTRAVENOUS

## 2018-05-08 MED ORDER — MEPERIDINE HCL 100 MG/ML IJ SOLN
INTRAMUSCULAR | Status: AC
  Filled 2018-05-08: qty 1

## 2018-05-08 MED ORDER — MIDAZOLAM HCL 5 MG/5ML IJ SOLN
INTRAMUSCULAR | Status: AC
  Filled 2018-05-08: qty 10

## 2018-05-08 MED ORDER — MIDAZOLAM HCL 5 MG/5ML IJ SOLN
INTRAMUSCULAR | Status: DC | PRN
  Administered 2018-05-08: 1 mg via INTRAVENOUS
  Administered 2018-05-08 (×2): 2 mg via INTRAVENOUS

## 2018-05-08 MED ORDER — SODIUM CHLORIDE 0.9 % IV SOLN
INTRAVENOUS | Status: DC
  Administered 2018-05-08: 1000 mL via INTRAVENOUS

## 2018-05-08 MED ORDER — PSYLLIUM 48.57 % PO POWD: 4.0000 g | Freq: Every day | ORAL | 0 refills | Status: DC

## 2018-05-08 NOTE — H&P (Signed)
Ashley [name] is an 53 y.o. female.   Chief Complaint: Patient is here for colonoscopy. HPI: Patient is 53 year old African female with history of HIV who is on triple therapy as well as history of hep C for which she has been treated who presents with 2-year history of intermittent lower abdominal cramping and rectal bleeding.  She describes passing moderate amount of fresh blood each time.  She is prone to constipation.  She has taken senna every now and then but she is trying to control her constipation by eating more fruits and vegetables. Family history as far as she knows is negative for inflammatory bowel disease or CRC.  CBC from 04/09/2018 revealed WBC of 4.7 H&H of 11.5 and 33.2. CD 4 count was 23 on 03/26/2018  Differential normal. Past Medical History:  Diagnosis Date  . Ankle fracture   . Anxiety   . Asthma   . Collagen vascular disease ([place])   . Depression   . Diabetes mellitus   . Elevated LFTs 01/13/2012  . Hepatitis C   . Hernia   . HIV (human immunodeficiency virus infection) ([place])   . HIV (human immunodeficiency virus infection) ([place])   . Hypertension   . Pinched nerve   . Scoliosis   . TB (tuberculosis)     Past Surgical History:  Procedure Laterality Date  . CESAREAN SECTION    . CHOLECYSTECTOMY    . HERNIA REPAIR    . LIVER BIOPSY      Family History  Problem Relation Age of Onset  . Diabetes Mother   . Hypertension Mother   . Stroke Mother        died 10/17/13 . Cancer Sister   . Cancer Maternal Aunt    Social History:  reports that she has been smoking cigarettes. She has a 35.00 pack-year smoking history. She has never used smokeless tobacco. She reports previous alcohol use of about 3.0 standard drinks of alcohol per week. She reports previous drug use. Drugs: Marijuana and Cocaine.  Allergies:  Allergies  Allergen Reactions  . Aspirin Other (See Comments)    Liver problems    Medications Prior to Admission  Medication Sig Dispense  Refill  . albuterol (PROVENTIL HFA) 108 (90 Base) MCG/ACT inhaler Inhale 2 puffs into the lungs every 6 (six) hours as needed for wheezing or shortness of breath. 6.7 g 1  . amLODipine (NORVASC) 10 MG tablet Take 1 tablet (10 mg total) by mouth at bedtime. 90 tablet 3  . bictegravir-emtricitabine-tenofovir AF (BIKTARVY) 50-200-25 MG TABS tablet Take 1 tablet by mouth daily. (Patient taking differently: Take 1 tablet by mouth at bedtime. ) 30 tablet 3  . cetirizine (ZYRTEC) 10 MG tablet Take 1 tablet (10 mg total) by mouth daily. (Patient taking differently: Take 10 mg by mouth at bedtime. ) 90 tablet 3  . desloratadine (CLARINEX) 5 MG tablet Take 5 mg by mouth at bedtime.    Marland Kitchen FLUoxetine (PROZAC) 20 MG capsule Take 1 capsule (20 mg total) by mouth daily. (Patient taking differently: Take 20 mg by mouth at bedtime. ) 90 capsule 1  . fluticasone (FLONASE) 50 MCG/ACT nasal spray Place 2 sprays into both nostrils daily. (Patient taking differently: Place 2 sprays into both nostrils 3 (three) times daily as needed for allergies. ) 16 g 6  . methocarbamol (ROBAXIN) 500 MG tablet TAKE ONE TABLET BY MOUTH EVERY 8 HOURS AS NEEDED FOR MUSCLE SPASMS (Patient taking differently: Take 500 mg by mouth at  bedtime. ) 30 tablet 0  . metroNIDAZOLE (FLAGYL) 500 MG tablet Take 1 tablet (500 mg total) by mouth 2 (two) times daily. 14 tablet 0  . Multiple Vitamin (MULTI-VITAMINS) TABS Take 1 tablet by mouth daily. (Patient taking differently: Take 1 tablet by mouth at bedtime. ) 90 tablet 3  . Omega-3 Fatty Acids (FISH OIL) 1000 MG CAPS Take 1,000 mg by mouth at bedtime.    . pantoprazole (PROTONIX) 40 MG tablet Take 40 mg by mouth at bedtime.     . polyethylene glycol (MIRALAX / GLYCOLAX) packet Take 17 g by mouth daily as needed (constipation).     . Pseudoephedrine-APAP-DM (DAYQUIL MULTI-SYMPTOM COLD/FLU PO) Take by mouth.    . senna (SENOKOT) 8.6 MG TABS tablet TAKE ONE TABLET BY MOUTH EVERY DAY AS NEEDED FOR  CONSTIPATION (Patient taking differently: Take 1 tablet by mouth at bedtime as needed (constipation). ) 30 tablet 0  . sennosides-docusate sodium (SENOKOT-S) 8.6-50 MG tablet Take 1 tablet by mouth daily as needed for constipation.     Manus Gunning BOWEL PREP KIT 17.5-3.13-1.6 GM/177ML SOLN Take 354 mLs by mouth once.    . vitamin B-12 (CYANOCOBALAMIN) 50 MCG tablet Take 50 mcg by mouth at bedtime.    . vitamin C (ASCORBIC ACID) 500 MG tablet Take 500 mg by mouth at bedtime.      Results for orders placed or performed during the hospital encounter of 05/08/18 (from the past 48 hour(s))  Glucose, capillary     Status: None   Collection Time: 05/08/18  1:33 PM  Result Value Ref Range   Glucose-Capillary 90 70 - 99 mg/dL   No results found.  ROS  Blood pressure 112/76, pulse 72, temperature 98.5 F (36.9 C), temperature source Oral, resp. rate 14, height 4' 7"  (1.397 m), weight 51.7 kg, SpO2 100 %. Physical Exam  Constitutional:  Well-developed thin African-American female in NAD.  HENT:  Mouth/Throat: Oropharynx is clear and moist.  Eyes: Conjunctivae are normal. No scleral icterus.  Neck: No thyromegaly present.  Cardiovascular: Normal rate, regular rhythm and normal heart sounds.  No murmur heard. Respiratory: Effort normal and breath sounds normal.  GI:  Abdomen is symmetrical with lower midline scar.  She has hernia twice and of the scar and around umbilicus.  Hernia is completely reducible.  Abdomen is soft and nontender with organomegaly or masses.  Musculoskeletal:        General: No edema.  Lymphadenopathy:    She has no cervical adenopathy.  Neurological: She is alert.  Skin: Skin is warm and dry.     Assessment/Plan Rectal bleeding. Diagnostic colonoscopy.  Hildred Laser, MD 05/08/2018, 2:28 PM

## 2018-05-08 NOTE — Op Note (Signed)
[facility] Patient Name: Ashley [name] Procedure Date: 05/08/2018 2:08 PM MRN: [id number] Date of Birth: Feb 07, 1966 Attending MD: Hildred Laser , MD CSN: [id number] Age: 53 Admit Type: Outpatient Procedure:                Colonoscopy Indications:              Rectal bleeding Providers:                Hildred Laser, MD, Charlsie Quest. Theda Sers RN, RN, Aram Candela Referring MD:             Koleen Distance. Gottschalk, DO Medicines:                Meperidine 50 mg IV Complications:            No immediate complications. Estimated Blood Loss:     Estimated blood loss was minimal. Procedure:                Pre-Anesthesia Assessment:                           - Prior to the procedure, a History and Physical                            was performed, and patient medications and                            allergies were reviewed. The patient's tolerance of                            previous anesthesia was also reviewed. The risks                            and benefits of the procedure and the sedation                            options and risks were discussed with the patient.                            All questions were answered, and informed consent                            was obtained. Prior Anticoagulants: The patient has                            taken no previous anticoagulant or antiplatelet                            agents. ASA Grade Assessment: III - A patient with                            severe systemic disease. After reviewing the risks  and benefits, the patient was deemed in                            satisfactory condition to undergo the procedure.                           After obtaining informed consent, the colonoscope                            was passed under direct vision. Throughout the                            procedure, the patient's blood pressure, pulse, and                            oxygen saturations were  monitored continuously. The                            [id number]-[id number] ([id number]) scope was introduced through                            the anus and advanced to the the cecum, identified                            by appendiceal orifice and ileocecal valve. The                            colonoscopy was performed without difficulty. The                            patient tolerated the procedure well. The quality                            of the bowel preparation was good. The ileocecal                            valve, appendiceal orifice, and rectum were                            photographed. Scope In: 2:40:09 PM Scope Out: 3:00:56 PM Scope Withdrawal Time: 0 hours 14 minutes 35 seconds  Total Procedure Duration: 0 hours 20 minutes 47 seconds  Findings:      The perianal examination was normal.      The digital rectal exam findings include decreased sphincter tone.      Three sessile polyps were found in the transverse colon. The polyps were       small in size. These were biopsied with a cold forceps for histology.       The pathology specimen was placed into Bottle Number 1.      External hemorrhoids were found during retroflexion. The hemorrhoids       were medium-sized. Impression:               - Decreased sphincter tone found on digital rectal  exam.                           - Three small polyps in the transverse colon.                            Biopsied.                           - External hemorrhoids. Moderate Sedation:      Moderate (conscious) sedation was administered by the endoscopy nurse       and supervised by the endoscopist. The following parameters were       monitored: oxygen saturation, heart rate, blood pressure, CO2       capnography and response to care. Total physician intraservice time was       27 minutes. Recommendation:           - Patient has a contact number available for                            emergencies. The signs and  symptoms of potential                            delayed complications were discussed with the                            patient. Return to normal activities tomorrow.                            Written discharge instructions were provided to the                            patient.                           - Resume previous diet today.                           - Continue present medications.                           - No aspirin, ibuprofen, naproxen, or other                            non-steroidal anti-inflammatory drugs for 1 day.                           - Await pathology results.                           - Repeat colonoscopy is recommended. The                            colonoscopy date will be determined after pathology                            results from today's exam become available for  review. Procedure Code(s):        --- Professional ---                           [phone], Colonoscopy, flexible; with biopsy, single                            or multiple                           99153, Moderate sedation; each additional 15                            minutes intraservice time                           G0500, Moderate sedation services provided by the                            same physician or other qualified health care                            professional performing a gastrointestinal                            endoscopic service that sedation supports,                            requiring the presence of an independent trained                            observer to assist in the monitoring of the                            patient's level of consciousness and physiological                            status; initial 15 minutes of intra-service time;                            patient age 56 years or older (additional time may                            be reported with [phone], as appropriate) Diagnosis Code(s):        --- Professional  ---                           K62.89, Other specified diseases of anus and rectum                           D12.3, Benign neoplasm of transverse colon (hepatic                            flexure or splenic flexure)  K64.4, Residual hemorrhoidal skin tags                           K62.5, Hemorrhage of anus and rectum CPT copyright 2018 American Medical Association. All rights reserved. The codes documented in this report are preliminary and upon coder review may  be revised to meet current compliance requirements. Hildred Laser, MD Hildred Laser, MD 05/08/2018 4:24:49 PM This report has been signed electronically. Number of Addenda: 0

## 2018-05-08 NOTE — Discharge Instructions (Signed)
No aspirin or NSAIDs for 24 hours. Resume usual medications as before. Metamucil 4 g by mouth daily at bedtime. High-fiber diet. No driving for 24 hours. Physician will call with biopsy results.   Colonoscopy, Adult, Care After This sheet gives you information about how to care for yourself after your procedure. Your doctor may also give you more specific instructions. If you have problems or questions, call your doctor. What can I expect after the procedure? After the procedure, it is common to have:  A small amount of blood in your poop for 24 hours.  Some gas.  Mild cramping or bloating in your belly. Follow these instructions at home: General instructions  For the first 24 hours after the procedure: ? Do not drive or use machinery. ? Do not sign important documents. ? Do not drink alcohol. ? Do your daily activities more slowly than normal. ? Eat foods that are soft and easy to digest.  Take over-the-counter or prescription medicines only as told by your doctor. To help cramping and bloating:   Try walking around.  Put heat on your belly (abdomen) as told by your doctor. Use a heat source that your doctor recommends, such as a moist heat pack or a heating pad. ? Put a towel between your skin and the heat source. ? Leave the heat on for 20-30 minutes. ? Remove the heat if your skin turns bright red. This is especially important if you cannot feel pain, heat, or cold. You can get burned. Eating and drinking   Drink enough fluid to keep your pee (urine) clear or pale yellow.  Return to your normal diet as told by your doctor. Avoid heavy or fried foods that are hard to digest.  Avoid drinking alcohol for as long as told by your doctor. Contact a doctor if:  You have blood in your poop (stool) 2-3 days after the procedure. Get help right away if:  You have more than a small amount of blood in your poop.  You see large clumps of tissue (blood clots) in your  poop.  Your belly is swollen.  You feel sick to your stomach (nauseous).  You throw up (vomit).  You have a fever.  You have belly pain that gets worse, and medicine does not help your pain. Summary  After the procedure, it is common to have a small amount of blood in your poop. You may also have mild cramping and bloating in your belly.  For the first 24 hours after the procedure, do not drive or use machinery, do not sign important documents, and do not drink alcohol.  Get help right away if you have a lot of blood in your poop, feel sick to your stomach, have a fever, or have more belly pain. This information is not intended to replace advice given to you by your health care provider. Make sure you discuss any questions you have with your health care provider. Document Released: 03/18/2010 Document Revised: 12/14/2016 Document Reviewed: 11/08/2015 Elsevier Interactive Patient Education  2019 [employer].  Hemorrhoids Hemorrhoids are swollen veins in and around the rectum or anus. There are two types of hemorrhoids:  Internal hemorrhoids. These occur in the veins that are just inside the rectum. They may poke through to the outside and become irritated and painful.  External hemorrhoids. These occur in the veins that are outside the anus and can be felt as a painful swelling or hard lump near the anus. Most hemorrhoids do not cause  serious problems, and they can be managed with home treatments such as diet and lifestyle changes. If home treatments do not help the symptoms, procedures can be done to shrink or remove the hemorrhoids. What are the causes? This condition is caused by increased pressure in the anal area. This pressure may result from various things, including:  Constipation.  Straining to have a bowel movement.  Diarrhea.  Pregnancy.  Obesity.  Sitting for long periods of time.  Heavy lifting or other activity that causes you to strain.  Anal  sex.  Riding a bike for a long period of time. What are the signs or symptoms? Symptoms of this condition include:  Pain.  Anal itching or irritation.  Rectal bleeding.  Leakage of stool (feces).  Anal swelling.  One or more lumps around the anus. How is this diagnosed? This condition can often be diagnosed through a visual exam. Other exams or tests may also be done, such as:  An exam that involves feeling the rectal area with a gloved hand (digital rectal exam).  An exam of the anal canal that is done using a small tube (anoscope).  A blood test, if you have lost a significant amount of blood.  A test to look inside the colon using a flexible tube with a camera on the end (sigmoidoscopy or colonoscopy). How is this treated? This condition can usually be treated at home. However, various procedures may be done if dietary changes, lifestyle changes, and other home treatments do not help your symptoms. These procedures can help make the hemorrhoids smaller or remove them completely. Some of these procedures involve surgery, and others do not. Common procedures include:  Rubber band ligation. Rubber bands are placed at the base of the hemorrhoids to cut off their blood supply.  Sclerotherapy. Medicine is injected into the hemorrhoids to shrink them.  Infrared coagulation. A type of light energy is used to get rid of the hemorrhoids.  Hemorrhoidectomy surgery. The hemorrhoids are surgically removed, and the veins that supply them are tied off.  Stapled hemorrhoidopexy surgery. The surgeon staples the base of the hemorrhoid to the rectal wall. Follow these instructions at home: Eating and drinking   Eat foods that have a lot of fiber in them, such as whole grains, beans, nuts, fruits, and vegetables.  Ask your health care provider about taking products that have added fiber (fiber supplements).  Reduce the amount of fat in your diet. You can do this by eating low-fat dairy  products, eating less red meat, and avoiding processed foods.  Drink enough fluid to keep your urine pale yellow. Managing pain and swelling   Take warm sitz baths for 20 minutes, 3-4 times a day to ease pain and discomfort. You may do this in a bathtub or using a portable sitz bath that fits over the toilet.  If directed, apply ice to the affected area. Using ice packs between sitz baths may be helpful. ? Put ice in a plastic bag. ? Place a towel between your skin and the bag. ? Leave the ice on for 20 minutes, 2-3 times a day. General instructions  Take over-the-counter and prescription medicines only as told by your health care provider.  Use medicated creams or suppositories as told.  Get regular exercise. Ask your health care provider how much and what kind of exercise is best for you. In general, you should do moderate exercise for at least 30 minutes on most days of the week (150 minutes each  week). This can include activities such as walking, biking, or yoga.  Go to the bathroom when you have the urge to have a bowel movement. Do not wait.  Avoid straining to have bowel movements.  Keep the anal area dry and clean. Use wet toilet paper or moist towelettes after a bowel movement.  Do not sit on the toilet for long periods of time. This increases blood pooling and pain.  Keep all follow-up visits as told by your health care provider. This is important. Contact a health care provider if you have:  Increasing pain and swelling that are not controlled by treatment or medicine.  Difficulty having a bowel movement, or you are unable to have a bowel movement.  Pain or inflammation outside the area of the hemorrhoids. Get help right away if you have:  Uncontrolled bleeding from your rectum. Summary  Hemorrhoids are swollen veins in and around the rectum or anus.  Most hemorrhoids can be managed with home treatments such as diet and lifestyle changes.  Taking warm sitz  baths can help ease pain and discomfort.  In severe cases, procedures or surgery can be done to shrink or remove the hemorrhoids. This information is not intended to replace advice given to you by your health care provider. Make sure you discuss any questions you have with your health care provider. Document Released: 02/11/2000 Document Revised: 07/05/2017 Document Reviewed: 07/05/2017 Elsevier Interactive Patient Education  2019 [place].  Colon Polyps  Polyps are tissue growths inside the body. Polyps can grow in many places, including the large intestine (colon). A polyp may be a round bump or a mushroom-shaped growth. You could have one polyp or several. Most colon polyps are noncancerous (benign). However, some colon polyps can become cancerous over time. Finding and removing the polyps early can help prevent this. What are the causes? The exact cause of colon polyps is not known. What increases the risk? You are more likely to develop this condition if you:  Have a family history of colon cancer or colon polyps.  Are older than 40 or older than 45 if you are African American.  Have inflammatory bowel disease, such as ulcerative colitis or Crohn's disease.  Have certain hereditary conditions, such as: ? Familial adenomatous polyposis. ? Lynch syndrome. ? Turcot syndrome. ? Peutz-Jeghers syndrome.  Are overweight.  Smoke cigarettes.  Do not get enough exercise.  Drink too much alcohol.  Eat a diet that is high in fat and red meat and low in fiber.  Had childhood cancer that was treated with abdominal radiation. What are the signs or symptoms? Most polyps do not cause symptoms. If you have symptoms, they may include:  Blood coming from your rectum when having a bowel movement.  Blood in your stool. The stool may look dark red or black.  Abdominal pain.  A change in bowel habits, such as constipation or diarrhea. How is this diagnosed? This condition is  diagnosed with a colonoscopy. This is a procedure in which a lighted, flexible scope is inserted into the anus and then passed into the colon to examine the area. Polyps are sometimes found when a colonoscopy is done as part of routine cancer screening tests. How is this treated? Treatment for this condition involves removing any polyps that are found. Most polyps can be removed during a colonoscopy. Those polyps will then be tested for cancer. Additional treatment may be needed depending on the results of testing. Follow these instructions at home: Lifestyle  Maintain a healthy weight, or lose weight if recommended by your health care provider.  Exercise every day or as told by your health care provider.  Do not use any products that contain nicotine or tobacco, such as cigarettes and e-cigarettes. If you need help quitting, ask your health care provider.  If you drink alcohol, limit how much you have: ? 0-1 drink a day for women. ? 0-2 drinks a day for men.  Be aware of how much alcohol is in your drink. In the [place] one drink equals one 12 oz bottle of beer (355 mL), one 5 oz glass of wine (148 mL), or one 1 oz shot of hard liquor (44 mL). Eating and drinking   Eat foods that are high in fiber, such as fruits, vegetables, and whole grains.  Eat foods that are high in calcium and vitamin D, such as milk, cheese, yogurt, eggs, liver, fish, and broccoli.  Limit foods that are high in fat, such as fried foods and desserts.  Limit the amount of red meat and processed meat you eat, such as hot dogs, sausage, bacon, and lunch meats. General instructions  Keep all follow-up visits as told by your health care provider. This is important. ? This includes having regularly scheduled colonoscopies. ? Talk to your health care provider about when you need a colonoscopy. Contact a health care provider if:  You have new or worsening bleeding during a bowel movement.  You have new or increased  blood in your stool.  You have a change in bowel habits.  You lose weight for no known reason. Summary  Polyps are tissue growths inside the body. Polyps can grow in many places, including the colon.  Most colon polyps are noncancerous (benign), but some can become cancerous over time.  This condition is diagnosed with a colonoscopy.  Treatment for this condition involves removing any polyps that are found. Most polyps can be removed during a colonoscopy. This information is not intended to replace advice given to you by your health care provider. Make sure you discuss any questions you have with your health care provider. Document Released: 11/10/2003 Document Revised: 05/31/2017 Document Reviewed: 05/31/2017 Elsevier Interactive Patient Education  2019 [employer].

## 2018-05-13 ENCOUNTER — Encounter (HOSPITAL_COMMUNITY): Payer: Self-pay | Admitting: Internal Medicine

## 2018-05-15 ENCOUNTER — Telehealth: Payer: Self-pay | Admitting: Family Medicine

## 2018-05-15 NOTE — Telephone Encounter (Signed)
I see that she had a colonoscopy.  Her GI doctor will contact her with any further recommendations.  I see that Dr Evette Doffing place a referral for hernia last June.  This should still be good.  She can contact the surgeon's office for appt now that her colonoscopy is done.

## 2018-05-15 NOTE — Telephone Encounter (Signed)
03-26-2018 last visit with Dr. Lajuana Ripple.  Please advise on referral.

## 2018-05-20 NOTE — Telephone Encounter (Signed)
Patient aware provider has reviewed colonoscopy results.  She was advised to call [facility] in [place] to discuss hernia surgery.    [phone]

## 2018-05-21 ENCOUNTER — Telehealth: Payer: Medicaid Other | Admitting: Family

## 2018-05-27 ENCOUNTER — Other Ambulatory Visit: Payer: Self-pay | Admitting: Family

## 2018-05-27 DIAGNOSIS — G8929 Other chronic pain: Secondary | ICD-10-CM

## 2018-05-27 DIAGNOSIS — M545 Low back pain: Principal | ICD-10-CM

## 2018-06-05 ENCOUNTER — Encounter: Payer: Medicaid Other | Admitting: Obstetrics and Gynecology

## 2018-06-28 ENCOUNTER — Other Ambulatory Visit: Payer: Self-pay | Admitting: Family Medicine

## 2018-06-28 ENCOUNTER — Other Ambulatory Visit: Payer: Self-pay | Admitting: Internal Medicine

## 2018-06-28 DIAGNOSIS — J439 Emphysema, unspecified: Secondary | ICD-10-CM

## 2018-06-28 DIAGNOSIS — B20 Human immunodeficiency virus [HIV] disease: Secondary | ICD-10-CM

## 2018-07-17 ENCOUNTER — Encounter: Payer: Medicaid Other | Admitting: Obstetrics and Gynecology

## 2018-07-29 ENCOUNTER — Other Ambulatory Visit: Payer: Self-pay | Admitting: Internal Medicine

## 2018-07-29 DIAGNOSIS — B20 Human immunodeficiency virus [HIV] disease: Secondary | ICD-10-CM

## 2018-07-30 ENCOUNTER — Encounter: Payer: Medicaid Other | Admitting: Obstetrics and Gynecology

## 2018-07-31 ENCOUNTER — Encounter: Payer: Self-pay | Admitting: Obstetrics and Gynecology

## 2018-08-28 ENCOUNTER — Other Ambulatory Visit: Payer: Self-pay | Admitting: *Deleted

## 2018-09-23 ENCOUNTER — Other Ambulatory Visit: Payer: Self-pay

## 2018-09-23 DIAGNOSIS — B20 Human immunodeficiency virus [HIV] disease: Secondary | ICD-10-CM

## 2018-09-23 MED ORDER — BIKTARVY 50-200-25 MG PO TABS: 1.0000 | ORAL_TABLET | Freq: Every day | ORAL | 0 refills | Status: DC

## 2018-09-24 ENCOUNTER — Other Ambulatory Visit: Payer: Self-pay

## 2018-09-24 ENCOUNTER — Ambulatory Visit (INDEPENDENT_AMBULATORY_CARE_PROVIDER_SITE_OTHER): Payer: Medicaid Other | Admitting: General Surgery

## 2018-09-24 ENCOUNTER — Encounter: Payer: Self-pay | Admitting: General Surgery

## 2018-09-24 ENCOUNTER — Encounter

## 2018-09-24 VITALS — BP 121/83 | HR 82 | Temp 98.2°F | Resp 16 | Ht <= 58 in | Wt 114.0 lb

## 2018-09-24 DIAGNOSIS — M6208 Separation of muscle (nontraumatic), other site: Secondary | ICD-10-CM

## 2018-09-24 DIAGNOSIS — K432 Incisional hernia without obstruction or gangrene: Secondary | ICD-10-CM

## 2018-09-24 NOTE — Patient Instructions (Signed)
Open Hernia Repair, Adult  Open hernia repair is a surgical procedure to fix a hernia. A hernia occurs when an internal organ or tissue pushes out through a weak spot in the abdominal wall muscles. Hernias commonly occur in the groin and around the navel. Most hernias tend to get worse over time. Often, surgery is done to prevent the hernia from becoming bigger, uncomfortable, or an emergency. Emergency surgery may be needed if abdominal contents get stuck in the opening (incarcerated hernia) or the blood supply gets cut off (strangulated hernia). In an open repair, an incision is made in the abdomen to perform the surgery. Tell a health care provider about:  Any allergies you have.  All medicines you are taking, including vitamins, herbs, eye drops, creams, and over-the-counter medicines.  Any problems you or family members have had with anesthetic medicines.  Any blood or bone disorders you have.  Any surgeries you have had.  Any medical conditions you have, including any recent cold or flu symptoms.  Whether you are pregnant or may be pregnant. What are the risks? Generally, this is a safe procedure. However, problems may occur, including:  Long-lasting (chronic) pain.  Bleeding.  Infection.  Damage to the testicle. This can cause shrinking or swelling.  Damage to the bladder, blood vessels, intestine, or nerves near the hernia.  Trouble passing urine.  Allergic reactions to medicines.  Return of the hernia. Medicines  Ask your health care provider about: ? Changing or stopping your regular medicines. This is especially important if you are taking diabetes medicines or blood thinners. ? Taking medicines such as aspirin and ibuprofen. These medicines can thin your blood. Do not take these medicines before your procedure if your health care provider instructs you not to.  You may be given antibiotic medicine to help prevent infection. General instructions  You may have  blood tests or imaging studies.  Ask your health care provider how your surgical site will be marked or identified.  If you smoke, do not smoke for at least 2 weeks before your procedure or for as long as told by your health care provider.  Let your health care provider know if you develop a cold or any infection before your surgery.  Plan to have someone take you home from the hospital or clinic.  If you will be going home right after the procedure, plan to have someone with you for 24 hours. What happens during the procedure?  To reduce your risk of infection: ? Your health care team will wash or sanitize their hands. ? Your skin will be washed with soap. ? Hair may be removed from the surgical area.  An IV tube will be inserted into one of your veins.  You will be given one or more of the following: ? A medicine to help you relax (sedative). ? A medicine to numb the area (local anesthetic). ? A medicine to make you fall asleep (general anesthetic).  Your surgeon will make an incision over the hernia.  The tissues of the hernia will be moved back into place.  The edges of the hernia may be stitched together.  The opening in the abdominal muscles will be closed with stitches (sutures). Or, your surgeon will place a mesh patch made of manmade (synthetic) material over the opening.  The incision will be closed.  A bandage (dressing) may be placed over the incision. The procedure may vary among health care providers and hospitals. What happens after the procedure?  Your blood pressure, heart rate, breathing rate, and blood oxygen level will be monitored until the medicines you were given have worn off.  You may be given medicine for pain.  Do not drive for 24 hours if you received a sedative. This information is not intended to replace advice given to you by your health care provider. Make sure you discuss any questions you have with your health care provider. Document  Released: 08/09/2000 Document Revised: 01/26/2017 Document Reviewed: 07/28/2015 Elsevier Patient Education  [place]  Diastasis recti is when the muscles of the abdomen (rectus abdominis muscles) become thin and separate. The result is a wider space between the right and left abdomen (abdominal) muscles. This wider space between the muscles may cause a bulge in the middle of your abdomen. You may notice this bulge when you are straining or when you sit up from a lying down position. Diastasis recti can affect men and women. It is most common among pregnant women, infants, people who are obese, and people who have had abdominal surgery. Exercise or surgical treatment may help correct it. What are the causes? Common causes of this condition include:  Pregnancy. The growing uterus puts pressure on the abdominal muscles, which causes the muscles to separate.  Obesity. Excess fat puts pressure on abdominal muscles.  Weightlifting.  Some abdomen exercises.  Advanced age.  Genetics.  Prior abdominal surgery. What increases the risk? This condition is more likely to develop in:  Women.  Newborns, especially newborns who are born early (prematurely). What are the signs or symptoms? Common symptoms of this condition include:  A bulge in the middle of the abdomen. You will notice it most when you sit up or strain.  Pain in the low back, pelvis, or hips.  Constipation.  Inability to control when you urinate (urinary incontinence).  Bloating.  Poor posture. How is this diagnosed? This condition is diagnosed with a physical exam. Your health care provider will ask you to lie flat on your back and do a crunch or half sit-up. If you have diastasis recti, a vertical bulge will appear between your abdominal muscles in the center of your abdomen. Your health care provider will measure the gap between your muscles with one of the following:  A medical device used  to measure the space between two objects (caliper).  A tape measure.  CT scan.  Ultrasound.  Finger spaces. Your health care provider will measure the space using their fingers. How is this treated? If your muscle separation is not too large, you may not need treatment. However, if you are a woman who plans to become pregnant again, you should treat this condition before your next pregnancy. Treatment may include:  Physical therapy to strengthen and tighten your abdominal muscles.  Lifestyle changes such as weight loss and exercise.  Over-the-counter pain medicines as needed.  Surgery to correct the separation. Follow these instructions at home: Activity  Return to your normal activities as told by your health care provider. Ask your health care provider what activities are safe for you.  When lifting weights or doing exercises using your abdominal muscles or the muscles in the center of your body that give stability (core muscles), make sure you are doing your exercises and movements correctly. Proper form can help to prevent the condition from happening again. General instructions  If you are overweight, ask your health care provider for help with weight loss. Losing even a small amount of weight  can help to improve your diastasis recti.  Take over-the-counter or prescription medicines only as told by your health care provider.  Do not strain. Straining can make the separation worse. Examples of straining include: ? Pushing hard to have a bowel movement, such as due to constipation. ? Lifting heavy objects, including children. ? Standing up and sitting down.  Take steps to prevent constipation: ? Drink enough fluid to keep your urine clear or pale yellow. ? Take over-the-counter or prescription medicines only as directed. ? Eat foods that are high in fiber, such as fresh fruits and vegetables, whole grains, and beans. ? Limit foods that are high in fat and processed sugars,  such as fried and sweet foods. Contact a health care provider if:  You notice a new bulge in your abdomen. Get help right away if:  You experience severe discomfort in your abdomen.  You develop severe abdominal pain along with nausea, vomiting, or fever. Summary  Diastasis recti is when the abdomen (abdominal) muscles become thin and separate. Your abdomen will stick out because the space between your right and left abdomen muscles has widened.  The most common symptom is a bulge in your abdomen. You will notice it most when you sit up or are straining.  This condition is diagnosed during a physical exam.  If the abdomen separation is not too big, you may choose not to have treatment. Otherwise, you may need to undergo physical therapy or surgery. This information is not intended to replace advice given to you by your health care provider. Make sure you discuss any questions you have with your health care provider. Document Released: 04/10/2016 Document Revised: 01/26/2017 Document Reviewed: 04/10/2016 Elsevier Patient Education  2020 [employer]. Ventral Hernia  A ventral hernia is a bulge of tissue from inside the abdomen that pushes through a weak area of the muscles that form the front wall of the abdomen. The tissues inside the abdomen are inside a sac (peritoneum). These tissues include the small intestine, large intestine, and the fatty tissue that covers the intestines (omentum). Sometimes, the bulge that forms a hernia contains intestines. Other hernias contain only fat. Ventral hernias do not go away without surgical treatment. There are several types of ventral hernias. You may have:  A hernia at an incision site from previous abdominal surgery (incisional hernia).  A hernia just above the belly button (epigastric hernia), or at the belly button (umbilical hernia). These types of hernias can develop from heavy lifting or straining.  A hernia that comes and goes (reducible  hernia). It may be visible only when you lift or strain. This type of hernia can be pushed back into the abdomen (reduced).  A hernia that traps abdominal tissue inside the hernia (incarcerated hernia). This type of hernia does not reduce.  A hernia that cuts off blood flow to the tissues inside the hernia (strangulated hernia). The tissues can start to die if this happens. This is a very painful bulge that cannot be reduced. A strangulated hernia is a medical emergency. What are the causes? This condition is caused by abdominal tissue putting pressure on an area of weakness in the abdominal muscles. What increases the risk? The following factors may make you more likely to develop this condition:  Being female.  Being 56 or older.  Being overweight or obese.  Having had previous abdominal surgery, especially if there was an infection after surgery.  Having had an injury to the abdominal wall.  Having had  several pregnancies.  Having a buildup of fluid inside the abdomen (ascites). What are the signs or symptoms? The only symptom of a ventral hernia may be a painless bulge in the abdomen. A reducible hernia may be visible only when you strain, cough, or lift. Other symptoms may include:  Dull pain.  A feeling of pressure. Signs and symptoms of a strangulated hernia may include:  Increasing pain.  Nausea and vomiting.  Pain when pressing on the hernia.  The skin over the hernia turning red or purple.  Constipation.  Blood in the stool (feces). How is this diagnosed? This condition may be diagnosed based on:  Your symptoms.  Your medical history.  A physical exam. You may be asked to cough or strain while standing. These actions increase the pressure inside your abdomen and force the hernia through the opening in your muscles. Your health care provider may try to reduce the hernia by pressing on it.  Imaging studies, such as an ultrasound or CT scan. How is this  treated? This condition is treated with surgery. If you have a strangulated hernia, surgery is done as soon as possible. If your hernia is small and not incarcerated, you may be asked to lose some weight before surgery. Follow these instructions at home:  Follow instructions from your health care provider about eating or drinking restrictions.  If you are overweight, your health care provider may recommend that you increase your activity level and eat a healthier diet.  Do not lift anything that is heavier than 10 lb (4.5 kg).  Return to your normal activities as told by your health care provider. Ask your health care provider what activities are safe for you. You may need to avoid activities that increase pressure on your hernia.  Take over-the-counter and prescription medicines only as told by your health care provider.  Keep all follow-up visits as told by your health care provider. This is important. Contact a health care provider if:  Your hernia gets larger.  Your hernia becomes painful. Get help right away if:  Your hernia becomes increasingly painful.  You have pain along with any of the following: ? Changes in skin color in the area of the hernia. ? Nausea. ? Vomiting. ? Fever. Summary  A ventral hernia is a bulge of tissue from inside the abdomen that pushes through a weak area of the muscles that form the front wall of the abdomen.  This condition is treated with surgery, which may be urgent depending on your hernia.  Do not lift anything that is heavier than 10 lb (4.5 kg), and follow activity instructions from your health care provider. This information is not intended to replace advice given to you by your health care provider. Make sure you discuss any questions you have with your health care provider. Document Released: 01/31/2012 Document Revised: 03/28/2017 Document Reviewed: 09/04/2016 Elsevier Patient Education  2020 [employer].

## 2018-09-24 NOTE — Progress Notes (Signed)
[facility] History and Physical   Chief Complaint    Pre-op Exam      Ashley [name] is a 53 y.o. female.  HPI: Ashley [name] is a 53 yo known to me after seeing her 11/2016 for possible hernia. She has a history of Hep C s/p treatment, HIV on treatment and last saw ID 09/2017, and had a prior ventral hernia repair with mesh 2006 with Dr. Geronimo Boot. She has been complaining of abdominal pain at the umbilicus. She had never had a colonoscopy, and did report some blood per rectum.  Given the question of needing a hernia repair, I asked for her to obtain this prior to further discussion.  She has since had a colonoscopy with Dr. Laural Golden and she was found to have some hemorrhoid and three small polyps.  She returns to discuss her options for repair.  I had reviewed her CT today and previously reviewed her CT from 10/2016 which does not show a hernia defect but there is some inflammation around the mesh/ haziness in the fat.  We had discussed that she could have pain from this, and she returned today to discuss her options.   She says the pain has been about the same but more often. She says that any bending or pushing makes it worse and she now has some nausea.  She had an inlay mesh repair with Dr. Geronimo Boot based on his notes in 2006.    Past Medical History:  Diagnosis Date  . Ankle fracture   . Anxiety   . Asthma   . Collagen vascular disease ([place])   . Depression   . Diabetes mellitus   . Elevated LFTs 01/13/2012  . Hepatitis C   . Hernia   . HIV (human immunodeficiency virus infection) ([place])   . HIV (human immunodeficiency virus infection) ([place])   . Hypertension   . Pinched nerve   . Scoliosis   . TB (tuberculosis)     Past Surgical History:  Procedure Laterality Date  . BIOPSY  05/08/2018   Procedure: biopsy;  Surgeon: Rogene Houston, MD;  Location: [facility];  Service: Endoscopy;;  proximal transverse colon polyp  . CESAREAN SECTION    . CHOLECYSTECTOMY    .  COLONOSCOPY N/A 05/08/2018   Procedure: COLONOSCOPY;  Surgeon: Rogene Houston, MD;  Location: [facility];  Service: Endoscopy;  Laterality: N/A;  2:00  . HERNIA REPAIR    . LIVER BIOPSY      Family History  Problem Relation Age of Onset  . Diabetes Mother   . Hypertension Mother   . Stroke Mother        died 10-12-2013 . Cancer Sister   . Cancer Maternal Aunt     Social History   Tobacco Use  . Smoking status: Current Every Day Smoker    Packs/day: 1.00    Years: 35.00    Pack years: 35.00    Types: Cigarettes  . Smokeless tobacco: Never Used  . Tobacco comment: 1 pack every 2-3 days  Substance Use Topics  . Alcohol use: Not Currently    Alcohol/week: 3.0 standard drinks    Types: 3 Cans of beer per week  . Drug use: Not Currently    Types: Marijuana, Cocaine    Medications: I have reviewed the patient's current medications. Allergies as of 09/24/2018      Reactions   Aspirin Other (See Comments)   Liver problems  Medication List       Accurate as of September 24, 2018 11:44 AM. If you have any questions, ask your nurse or doctor.        albuterol 108 (90 Base) MCG/ACT inhaler Commonly known as: VENTOLIN HFA INHALE 2 PUFFS INTO THE LUNGS EVERY 6 HOURS AS NEEDED FOR WHEEZING ORSHORTNESS OF BREATH   amLODipine 10 MG tablet Commonly known as: NORVASC Take 1 tablet (10 mg total) by mouth at bedtime.   Biktarvy 50-200-25 MG Tabs tablet Generic drug: bictegravir-emtricitabine-tenofovir AF Take 1 tablet by mouth daily.   cetirizine 10 MG tablet Commonly known as: ZYRTEC Take 1 tablet (10 mg total) by mouth daily. What changed: when to take this   DAYQUIL MULTI-SYMPTOM COLD/FLU PO Take by mouth.   desloratadine 5 MG tablet Commonly known as: CLARINEX Take 5 mg by mouth at bedtime.   Fish Oil 1000 MG Caps Take 1,000 mg by mouth at bedtime.   FLUoxetine 20 MG capsule Commonly known as: PROZAC Take 1 capsule (20 mg total) by mouth daily. What  changed: when to take this   fluticasone 50 MCG/ACT nasal spray Commonly known as: FLONASE Place 2 sprays into both nostrils daily. What changed:   when to take this  reasons to take this   methocarbamol 500 MG tablet Commonly known as: ROBAXIN Take 1 tablet (500 mg total) by mouth every 8 (eight) hours as needed for muscle spasms.   metroNIDAZOLE 500 MG tablet Commonly known as: FLAGYL Take 1 tablet (500 mg total) by mouth 2 (two) times daily.   Multi-Vitamins Tabs Take 1 tablet by mouth daily. What changed: when to take this   pantoprazole 40 MG tablet Commonly known as: PROTONIX Take 40 mg by mouth at bedtime.   polyethylene glycol 17 g packet Commonly known as: MIRALAX / GLYCOLAX Take 17 g by mouth daily as needed (constipation).   Psyllium 48.57 % Powd Take 4 g by mouth at bedtime.   senna 8.6 MG Tabs tablet Commonly known as: SENOKOT Take 1 tablet (8.6 mg total) by mouth at bedtime as needed (constipation).   vitamin B-12 50 MCG tablet Commonly known as: CYANOCOBALAMIN Take 50 mcg by mouth at bedtime.   vitamin C 500 MG tablet Commonly known as: ASCORBIC ACID Take 500 mg by mouth at bedtime.        ROS:  A comprehensive review of systems was negative except for: Gastrointestinal: positive for abdominal pain and nausea Hematologic/lymphatic: positive for HIV on treatment  Blood pressure 121/83, pulse 82, temperature 98.2 F (36.8 C), temperature source Tympanic, resp. rate 16, height 4' 7.5" (1.41 m), weight 114 lb (51.7 kg), SpO2 98 %. Physical Exam Vitals signs reviewed.  HENT:     Head: Normocephalic.     Nose: Nose normal.     Mouth/Throat:     Mouth: Mucous membranes are moist.  Eyes:     Extraocular Movements: Extraocular movements intact.     Pupils: Pupils are equal, round, and reactive to light.  Neck:     Musculoskeletal: Normal range of motion.  Cardiovascular:     Rate and Rhythm: Normal rate.  Pulmonary:     Effort: Pulmonary  effort is normal.     Breath sounds: Normal breath sounds.  Abdominal:     General: There is no distension.     Palpations: Abdomen is soft.     Tenderness: There is abdominal tenderness.     Comments: Upper umbilicus with scarring felt, some movement with  bearing down making me think there is some omentum between the fascia and inlay mesh, diastasis superior   Musculoskeletal: Normal range of motion.        General: No swelling.  Skin:    General: Skin is warm and dry.  Neurological:     General: No focal deficit present.     Mental Status: She is alert and oriented to person, place, and time.  Psychiatric:        Mood and Affect: Mood normal.        Behavior: Behavior normal.        Thought Content: Thought content normal.        Judgment: Judgment normal.     Results: CT 10/2016- no obvious hernia defect, prior mesh with diastasis superior, some haziness around mesh  Assessment & Plan:  Ashley [name] is a 53 y.o. female with likely a small recurrence in the inlay mesh based on exam. When she bears down there is some movement/ bulging that feels like a hernia in addition to the diastasis. She also has some scarring that could be causing some pain.   -Open incisional hernia repair with mesh for recurrent hernia -Will possibly need to explant some mesh   All questions were answered to the satisfaction of the patient.  The risk and benefits of incisional hernia repair with mesh were discussed including but not limited to bleeding, infection, injury to other organs, need for mesh, possibly not relieve symptoms, possible need to remove some of the prior mesh.  After careful consideration, Ashley [name] has decided to proceed.    Virl Cagey 09/24/2018, 11:44 AM

## 2018-09-26 NOTE — Patient Instructions (Addendum)
ARDENA [name]  09/26/2018     @PREFPERIOPPHARMACY @   Your procedure is scheduled on  09/30/2018  Report to [facility] at  720  A.M.  Call this number if you have problems the morning of surgery:  [phone]   Remember:  Do not eat or drink after midnight.                       Take these medicines the morning of surgery with A SIP OF WATER  Amlodipine, bictarvy, robaxin( if needed), protonix. Use your inhaler before you come.    Do not wear jewelry, make-up or nail polish.  Do not wear lotions, powders, or perfumes. PLEASE brush your teeth and wear deodorant.   Do not shave 48 hours prior to surgery.  Men may shave face and neck.  Do not bring valuables to the hospital.  [facility] is not responsible for any belongings or valuables.  Contacts, dentures or bridgework may not be worn into surgery.  Leave your suitcase in the car.  After surgery it may be brought to your room.  For patients admitted to the hospital, discharge time will be determined by your treatment team.  Patients discharged the day of surgery will not be allowed to drive home.   Name and phone number of your driver:   family Special instructions:  None  Please read over the following fact sheets that you were given. Anesthesia Post-op Instructions and Care and Recovery After Surgery       Open Hernia Repair, Adult, Care After These instructions give you information about caring for yourself after your procedure. Your doctor may also give you more specific instructions. If you have problems or questions, contact your doctor. Follow these instructions at home: Surgical cut (incision) care   Follow instructions from your doctor about how to take care of your surgical cut area. Make sure you: ? Wash your hands with soap and water before you change your bandage (dressing). If you cannot use soap and water, use hand sanitizer. ? Change your bandage as told by your doctor. ? Leave stitches  (sutures), skin glue, or skin tape (adhesive) strips in place. They may need to stay in place for 2 weeks or longer. If tape strips get loose and curl up, you may trim the loose edges. Do not remove tape strips completely unless your doctor says it is okay.  Check your surgical cut every day for signs of infection. Check for: ? More redness, swelling, or pain. ? More fluid or blood. ? Warmth. ? Pus or a bad smell. Activity  Do not drive or use heavy machinery while taking prescription pain medicine. Do not drive until your doctor says it is okay.  Until your doctor says it is okay: ? Do not lift anything that is heavier than 10 lb (4.5 kg). ? Do not play contact sports.  Return to your normal activities as told by your doctor. Ask your doctor what activities are safe. General instructions  To prevent or treat having a hard time pooping (constipation) while you are taking prescription pain medicine, your doctor may recommend that you: ? Drink enough fluid to keep your pee (urine) clear or pale yellow. ? Take over-the-counter or prescription medicines. ? Eat foods that are high in fiber, such as fresh fruits and vegetables, whole grains, and beans. ? Limit foods that are high in fat and processed sugars, such as fried  and sweet foods.  Take over-the-counter and prescription medicines only as told by your doctor.  Do not take baths, swim, or use a hot tub until your doctor says it is okay.  Keep all follow-up visits as told by your doctor. This is important. Contact a doctor if:  You develop a rash.  You have more redness, swelling, or pain around your surgical cut.  You have more fluid or blood coming from your surgical cut.  Your surgical cut feels warm to the touch.  You have pus or a bad smell coming from your surgical cut.  You have a fever or chills.  You have blood in your poop (stool).  You have not pooped in 2-3 days.  Medicine does not help your pain. Get help  right away if:  You have chest pain or you are short of breath.  You feel light-headed.  You feel weak and dizzy (feel faint).  You have very bad pain.  You throw up (vomit) and your pain is worse. This information is not intended to replace advice given to you by your health care provider. Make sure you discuss any questions you have with your health care provider. Document Released: 03/06/2014 Document Revised: 06/07/2018 Document Reviewed: 07/28/2015 Elsevier Patient Education  2020 [place] Anesthesia, Adult, Care After This sheet gives you information about how to care for yourself after your procedure. Your health care provider may also give you more specific instructions. If you have problems or questions, contact your health care provider. What can I expect after the procedure? After the procedure, the following side effects are common:  Pain or discomfort at the IV site.  Nausea.  Vomiting.  Sore throat.  Trouble concentrating.  Feeling cold or chills.  Weak or tired.  Sleepiness and fatigue.  Soreness and body aches. These side effects can affect parts of the body that were not involved in surgery. Follow these instructions at home:  For at least 24 hours after the procedure:  Have a responsible adult stay with you. It is important to have someone help care for you until you are awake and alert.  Rest as needed.  Do not: ? Participate in activities in which you could fall or become injured. ? Drive. ? Use heavy machinery. ? Drink alcohol. ? Take sleeping pills or medicines that cause drowsiness. ? Make important decisions or sign legal documents. ? Take care of children on your own. Eating and drinking  Follow any instructions from your health care provider about eating or drinking restrictions.  When you feel hungry, start by eating small amounts of foods that are soft and easy to digest (bland), such as toast. Gradually return to  your regular diet.  Drink enough fluid to keep your urine pale yellow.  If you vomit, rehydrate by drinking water, juice, or clear broth. General instructions  If you have sleep apnea, surgery and certain medicines can increase your risk for breathing problems. Follow instructions from your health care provider about wearing your sleep device: ? Anytime you are sleeping, including during daytime naps. ? While taking prescription pain medicines, sleeping medicines, or medicines that make you drowsy.  Return to your normal activities as told by your health care provider. Ask your health care provider what activities are safe for you.  Take over-the-counter and prescription medicines only as told by your health care provider.  If you smoke, do not smoke without supervision.  Keep all follow-up visits as told by your  health care provider. This is important. Contact a health care provider if:  You have nausea or vomiting that does not get better with medicine.  You cannot eat or drink without vomiting.  You have pain that does not get better with medicine.  You are unable to pass urine.  You develop a skin rash.  You have a fever.  You have redness around your IV site that gets worse. Get help right away if:  You have difficulty breathing.  You have chest pain.  You have blood in your urine or stool, or you vomit blood. Summary  After the procedure, it is common to have a sore throat or nausea. It is also common to feel tired.  Have a responsible adult stay with you for the first 24 hours after general anesthesia. It is important to have someone help care for you until you are awake and alert.  When you feel hungry, start by eating small amounts of foods that are soft and easy to digest (bland), such as toast. Gradually return to your regular diet.  Drink enough fluid to keep your urine pale yellow.  Return to your normal activities as told by your health care provider.  Ask your health care provider what activities are safe for you. This information is not intended to replace advice given to you by your health care provider. Make sure you discuss any questions you have with your health care provider. Document Released: 05/22/2000 Document Revised: 02/16/2017 Document Reviewed: 09/29/2016 Elsevier Patient Education  2020 [employer]. How to Use Chlorhexidine for Bathing Chlorhexidine gluconate (CHG) is a germ-killing (antiseptic) solution that is used to clean the skin. It can get rid of the bacteria that normally live on the skin and can keep them away for about 24 hours. To clean your skin with CHG, you may be given:  A CHG solution to use in the shower or as part of a sponge bath.  A prepackaged cloth that contains CHG. Cleaning your skin with CHG may help lower the risk for infection:  While you are staying in the intensive care unit of the hospital.  If you have a vascular access, such as a central line, to provide short-term or long-term access to your veins.  If you have a catheter to drain urine from your bladder.  If you are on a ventilator. A ventilator is a machine that helps you breathe by moving air in and out of your lungs.  After surgery. What are the risks? Risks of using CHG include:  A skin reaction.  Hearing loss, if CHG gets in your ears.  Eye injury, if CHG gets in your eyes and is not rinsed out.  The CHG product catching fire. Make sure that you avoid smoking and flames after applying CHG to your skin. Do not use CHG:  If you have a chlorhexidine allergy or have previously reacted to chlorhexidine.  On babies younger than 74 months of age. How to use CHG solution  Use CHG only as told by your health care provider, and follow the instructions on the label.  Use the full amount of CHG as directed. Usually, this is one bottle. During a shower Follow these steps when using CHG solution during a shower (unless your health  care provider gives you different instructions): 1. Start the shower. 2. Use your normal soap and shampoo to wash your face and hair. 3. Turn off the shower or move out of the shower stream. 4. Pour the  CHG onto a clean washcloth. Do not use any type of brush or rough-edged sponge. 5. Starting at your neck, lather your body down to your toes. Make sure you follow these instructions: ? If you will be having surgery, pay special attention to the part of your body where you will be having surgery. Scrub this area for at least 1 minute. ? Do not use CHG on your head or face. If the solution gets into your ears or eyes, rinse them well with water. ? Avoid your genital area. ? Avoid any areas of skin that have broken skin, cuts, or scrapes. ? Scrub your back and under your arms. Make sure to wash skin folds. 6. Let the lather sit on your skin for 1-2 minutes or as long as told by your health care provider. 7. Thoroughly rinse your entire body in the shower. Make sure that all body creases and crevices are rinsed well. 8. Dry off with a clean towel. Do not put any substances on your body afterward-such as powder, lotion, or perfume-unless you are told to do so by your health care provider. Only use lotions that are recommended by the manufacturer. 9. Put on clean clothes or pajamas. 10. If it is the night before your surgery, sleep in clean sheets.  During a sponge bath Follow these steps when using CHG solution during a sponge bath (unless your health care provider gives you different instructions): 1. Use your normal soap and shampoo to wash your face and hair. 2. Pour the CHG onto a clean washcloth. 3. Starting at your neck, lather your body down to your toes. Make sure you follow these instructions: ? If you will be having surgery, pay special attention to the part of your body where you will be having surgery. Scrub this area for at least 1 minute. ? Do not use CHG on your head or face. If the  solution gets into your ears or eyes, rinse them well with water. ? Avoid your genital area. ? Avoid any areas of skin that have broken skin, cuts, or scrapes. ? Scrub your back and under your arms. Make sure to wash skin folds. 4. Let the lather sit on your skin for 1-2 minutes or as long as told by your health care provider. 5. Using a different clean, wet washcloth, thoroughly rinse your entire body. Make sure that all body creases and crevices are rinsed well. 6. Dry off with a clean towel. Do not put any substances on your body afterward-such as powder, lotion, or perfume-unless you are told to do so by your health care provider. Only use lotions that are recommended by the manufacturer. 7. Put on clean clothes or pajamas. 8. If it is the night before your surgery, sleep in clean sheets. How to use CHG prepackaged cloths  Only use CHG cloths as told by your health care provider, and follow the instructions on the label.  Use the CHG cloth on clean, dry skin.  Do not use the CHG cloth on your head or face unless your health care provider tells you to.  When washing with the CHG cloth: ? Avoid your genital area. ? Avoid any areas of skin that have broken skin, cuts, or scrapes. Before surgery Follow these steps when using a CHG cloth to clean before surgery (unless your health care provider gives you different instructions): 1. Using the CHG cloth, vigorously scrub the part of your body where you will be having surgery. Scrub using a  back-and-forth motion for 3 minutes. The area on your body should be completely wet with CHG when you are done scrubbing. 2. Do not rinse. Discard the cloth and let the area air-dry. Do not put any substances on the area afterward, such as powder, lotion, or perfume. 3. Put on clean clothes or pajamas. 4. If it is the night before your surgery, sleep in clean sheets.  For general bathing Follow these steps when using CHG cloths for general bathing (unless  your health care provider gives you different instructions). 1. Use a separate CHG cloth for each area of your body. Make sure you wash between any folds of skin and between your fingers and toes. Wash your body in the following order, switching to a new cloth after each step: ? The front of your neck, shoulders, and chest. ? Both of your arms, under your arms, and your hands. ? Your stomach and groin area, avoiding the genitals. ? Your right leg and foot. ? Your left leg and foot. ? The back of your neck, your back, and your buttocks. 2. Do not rinse. Discard the cloth and let the area air-dry. Do not put any substances on your body afterward-such as powder, lotion, or perfume-unless you are told to do so by your health care provider. Only use lotions that are recommended by the manufacturer. 3. Put on clean clothes or pajamas. Contact a health care provider if:  Your skin gets irritated after scrubbing.  You have questions about using your solution or cloth. Get help right away if:  Your eyes become very red or swollen.  Your eyes itch badly.  Your skin itches badly and is red or swollen.  Your hearing changes.  You have trouble seeing.  You have swelling or tingling in your mouth or throat.  You have trouble breathing.  You swallow any chlorhexidine. Summary  Chlorhexidine gluconate (CHG) is a germ-killing (antiseptic) solution that is used to clean the skin. Cleaning your skin with CHG may help to lower your risk for infection.  You may be given CHG to use for bathing. It may be in a bottle or in a prepackaged cloth to use on your skin. Carefully follow your health care provider's instructions and the instructions on the product label.  Do not use CHG if you have a chlorhexidine allergy.  Contact your health care provider if your skin gets irritated after scrubbing. This information is not intended to replace advice given to you by your health care provider. Make sure you  discuss any questions you have with your health care provider. Document Released: 11/08/2011 Document Revised: 05/02/2018 Document Reviewed: 01/11/2017 Elsevier Patient Education  2020 [employer].

## 2018-09-27 ENCOUNTER — Other Ambulatory Visit: Payer: Self-pay

## 2018-09-27 ENCOUNTER — Other Ambulatory Visit: Payer: Self-pay | Admitting: Family Medicine

## 2018-09-27 ENCOUNTER — Other Ambulatory Visit (HOSPITAL_COMMUNITY)
Admission: RE | Admit: 2018-09-27 | Discharge: 2018-09-27 | Disposition: A | Discharge status: Home / Self Care | Payer: Medicaid Other | Source: Ambulatory Visit | Attending: General Surgery | Admitting: General Surgery

## 2018-09-27 ENCOUNTER — Encounter (HOSPITAL_COMMUNITY): Payer: Self-pay

## 2018-09-27 ENCOUNTER — Encounter (HOSPITAL_COMMUNITY)
Admission: RE | Admit: 2018-09-27 | Discharge: 2018-09-27 | Disposition: A | Discharge status: Home / Self Care | Payer: Medicaid Other | Source: Ambulatory Visit | Attending: General Surgery | Admitting: General Surgery

## 2018-09-27 DIAGNOSIS — Z01818 Encounter for other preprocedural examination: Secondary | ICD-10-CM | POA: Insufficient documentation

## 2018-09-27 DIAGNOSIS — K432 Incisional hernia without obstruction or gangrene: Secondary | ICD-10-CM | POA: Diagnosis not present

## 2018-09-27 DIAGNOSIS — Z20828 Contact with and (suspected) exposure to other viral communicable diseases: Secondary | ICD-10-CM | POA: Diagnosis not present

## 2018-09-27 DIAGNOSIS — F339 Major depressive disorder, recurrent, unspecified: Secondary | ICD-10-CM

## 2018-09-27 HISTORY — DX: Chronic obstructive pulmonary disease, unspecified: J44.9

## 2018-09-27 HISTORY — DX: Hypothyroidism, unspecified: E03.9

## 2018-09-27 LAB — CBC WITH DIFFERENTIAL/PLATELET
Abs Immature Granulocytes: 0.01 10*3/uL (ref 0.00–0.07)
Basophils Absolute: 0 10*3/uL (ref 0.0–0.1)
Basophils Relative: 0 %
Eosinophils Absolute: 0.1 10*3/uL (ref 0.0–0.5)
Eosinophils Relative: 2 %
HCT: 36.5 % (ref 36.0–46.0)
Hemoglobin: 12.4 g/dL (ref 12.0–15.0)
Immature Granulocytes: 0 %
Lymphocytes Relative: 48 %
Lymphs Abs: 2.4 10*3/uL (ref 0.7–4.0)
MCH: 34 pg (ref 26.0–34.0)
MCHC: 34 g/dL (ref 30.0–36.0)
MCV: 100 fL (ref 80.0–100.0)
Monocytes Absolute: 0.4 10*3/uL (ref 0.1–1.0)
Monocytes Relative: 9 %
Neutro Abs: 2.1 10*3/uL (ref 1.7–7.7)
Neutrophils Relative %: 41 %
Platelets: 147 10*3/uL — ABNORMAL LOW (ref 150–400)
RBC: 3.65 MIL/uL — ABNORMAL LOW (ref 3.87–5.11)
RDW: 13.2 % (ref 11.5–15.5)
WBC: 5 10*3/uL (ref 4.0–10.5)
nRBC: 0 % (ref 0.0–0.2)

## 2018-09-27 LAB — GLUCOSE, CAPILLARY: Glucose-Capillary: 97 mg/dL (ref 70–99)

## 2018-09-27 LAB — PROTIME-INR
INR: 1.1 (ref 0.8–1.2)
Prothrombin Time: 14 seconds (ref 11.4–15.2)

## 2018-09-27 LAB — BASIC METABOLIC PANEL
Anion gap: 9 (ref 5–15)
BUN: 11 mg/dL (ref 6–20)
CO2: 23 mmol/L (ref 22–32)
Calcium: 9.3 mg/dL (ref 8.9–10.3)
Chloride: 109 mmol/L (ref 98–111)
Creatinine, Ser: 1.03 mg/dL — ABNORMAL HIGH (ref 0.44–1.00)
GFR calc Af Amer: >60 mL/min (ref >60)
GFR calc non Af Amer: >60 mL/min (ref >60)
Glucose, Bld: 95 mg/dL (ref 70–99)
Potassium: 4.1 mmol/L (ref 3.5–5.1)
Sodium: 141 mmol/L (ref 135–145)

## 2018-09-27 LAB — HEMOGLOBIN A1C
Hgb A1c MFr Bld: 5.4 % (ref 4.8–5.6)
Mean Plasma Glucose: 108.28 mg/dL

## 2018-09-27 LAB — SARS CORONAVIRUS 2 (TAT 6-24 HRS): SARS Coronavirus 2: NEGATIVE

## 2018-09-27 NOTE — H&P (Signed)
[facility] History and Physical      Chief Complaint    Pre-op Exam      Ashley [name] is a 53 y.o. female.  HPI: Ashley [name] is a 53 yo known to me after seeing her 11/2016 for possible hernia. She has a history of Hep C s/p treatment, HIV on treatment and last saw ID 09/2017, and had a prior ventral hernia repair with mesh 2006 with Dr. Geronimo Boot. She has been complaining of abdominal pain at the umbilicus. She had never had a colonoscopy, and did report some blood per rectum.  Given the question of needing a hernia repair, I asked for her to obtain this prior to further discussion.  She has since had a colonoscopy with Dr. Laural Golden and she was found to have some hemorrhoid and three small polyps.  She returns to discuss her options for repair.  I had reviewed her CT today and previously reviewed her CT from 10/2016 which does not show a hernia defect but there is some inflammation around the mesh/ haziness in the fat.  We had discussed that she could have pain from this, and she returned today to discuss her options.   She says the pain has been about the same but more often. She says that any bending or pushing makes it worse and she now has some nausea.  She had an inlay mesh repair with Dr. Geronimo Boot based on his notes in 2006.        Past Medical History:  Diagnosis Date  . Ankle fracture   . Anxiety   . Asthma   . Collagen vascular disease ([place])   . Depression   . Diabetes mellitus   . Elevated LFTs 01/13/2012  . Hepatitis C   . Hernia   . HIV (human immunodeficiency virus infection) ([place])   . HIV (human immunodeficiency virus infection) ([place])   . Hypertension   . Pinched nerve   . Scoliosis   . TB (tuberculosis)          Past Surgical History:  Procedure Laterality Date  . BIOPSY  05/08/2018   Procedure: biopsy;  Surgeon: Rogene Houston, MD;  Location: [facility];  Service: Endoscopy;;  proximal transverse colon polyp  . CESAREAN  SECTION    . CHOLECYSTECTOMY    . COLONOSCOPY N/A 05/08/2018   Procedure: COLONOSCOPY;  Surgeon: Rogene Houston, MD;  Location: [facility];  Service: Endoscopy;  Laterality: N/A;  2:00  . HERNIA REPAIR    . LIVER BIOPSY           Family History  Problem Relation Age of Onset  . Diabetes Mother   . Hypertension Mother   . Stroke Mother        died 10-06-13 . Cancer Sister   . Cancer Maternal Aunt     Social History        Tobacco Use  . Smoking status: Current Every Day Smoker    Packs/day: 1.00    Years: 35.00    Pack years: 35.00    Types: Cigarettes  . Smokeless tobacco: Never Used  . Tobacco comment: 1 pack every 2-3 days  Substance Use Topics  . Alcohol use: Not Currently    Alcohol/week: 3.0 standard drinks    Types: 3 Cans of beer per week  . Drug use: Not Currently    Types: Marijuana, Cocaine    Medications: I have reviewed the patient's current medications.  Allergies as of 09/24/2018      Reactions   Aspirin Other (See Comments)   Liver problems         Medication List       Accurate as of September 24, 2018 11:44 AM. If you have any questions, ask your nurse or doctor.        albuterol 108 (90 Base) MCG/ACT inhaler Commonly known as: VENTOLIN HFA INHALE 2 PUFFS INTO THE LUNGS EVERY 6 HOURS AS NEEDED FOR WHEEZING ORSHORTNESS OF BREATH   amLODipine 10 MG tablet Commonly known as: NORVASC Take 1 tablet (10 mg total) by mouth at bedtime.   Biktarvy 50-200-25 MG Tabs tablet Generic drug: bictegravir-emtricitabine-tenofovir AF Take 1 tablet by mouth daily.   cetirizine 10 MG tablet Commonly known as: ZYRTEC Take 1 tablet (10 mg total) by mouth daily. What changed: when to take this   DAYQUIL MULTI-SYMPTOM COLD/FLU PO Take by mouth.   desloratadine 5 MG tablet Commonly known as: CLARINEX Take 5 mg by mouth at bedtime.   Fish Oil 1000 MG Caps Take 1,000 mg by mouth at bedtime.    FLUoxetine 20 MG capsule Commonly known as: PROZAC Take 1 capsule (20 mg total) by mouth daily. What changed: when to take this   fluticasone 50 MCG/ACT nasal spray Commonly known as: FLONASE Place 2 sprays into both nostrils daily. What changed:   when to take this  reasons to take this   methocarbamol 500 MG tablet Commonly known as: ROBAXIN Take 1 tablet (500 mg total) by mouth every 8 (eight) hours as needed for muscle spasms.   metroNIDAZOLE 500 MG tablet Commonly known as: FLAGYL Take 1 tablet (500 mg total) by mouth 2 (two) times daily.   Multi-Vitamins Tabs Take 1 tablet by mouth daily. What changed: when to take this   pantoprazole 40 MG tablet Commonly known as: PROTONIX Take 40 mg by mouth at bedtime.   polyethylene glycol 17 g packet Commonly known as: MIRALAX / GLYCOLAX Take 17 g by mouth daily as needed (constipation).   Psyllium 48.57 % Powd Take 4 g by mouth at bedtime.   senna 8.6 MG Tabs tablet Commonly known as: SENOKOT Take 1 tablet (8.6 mg total) by mouth at bedtime as needed (constipation).   vitamin B-12 50 MCG tablet Commonly known as: CYANOCOBALAMIN Take 50 mcg by mouth at bedtime.   vitamin C 500 MG tablet Commonly known as: ASCORBIC ACID Take 500 mg by mouth at bedtime.        ROS:  A comprehensive review of systems was negative except for: Gastrointestinal: positive for abdominal pain and nausea Hematologic/lymphatic: positive for HIV on treatment  Blood pressure 121/83, pulse 82, temperature 98.2 F (36.8 C), temperature source Tympanic, resp. rate 16, height 4' 7.5" (1.41 m), weight 114 lb (51.7 kg), SpO2 98 %. Physical Exam Vitals signs reviewed.  HENT:     Head: Normocephalic.     Nose: Nose normal.     Mouth/Throat:     Mouth: Mucous membranes are moist.  Eyes:     Extraocular Movements: Extraocular movements intact.     Pupils: Pupils are equal, round, and reactive to light.  Neck:      Musculoskeletal: Normal range of motion.  Cardiovascular:     Rate and Rhythm: Normal rate.  Pulmonary:     Effort: Pulmonary effort is normal.     Breath sounds: Normal breath sounds.  Abdominal:     General: There is no distension.  Palpations: Abdomen is soft.     Tenderness: There is abdominal tenderness.     Comments: Upper umbilicus with scarring felt, some movement with bearing down making me think there is some omentum between the fascia and inlay mesh, diastasis superior   Musculoskeletal: Normal range of motion.        General: No swelling.  Skin:    General: Skin is warm and dry.  Neurological:     General: No focal deficit present.     Mental Status: She is alert and oriented to person, place, and time.  Psychiatric:        Mood and Affect: Mood normal.        Behavior: Behavior normal.        Thought Content: Thought content normal.        Judgment: Judgment normal.     Results: CT 10/2016- no obvious hernia defect, prior mesh with diastasis superior, some haziness around mesh  Assessment & Plan:  Ashley [name] is a 53 y.o. female with likely a small recurrence in the inlay mesh based on exam. When she bears down there is some movement/ bulging that feels like a hernia in addition to the diastasis. She also has some scarring that could be causing some pain.   -Open incisional hernia repair with mesh for recurrent hernia -Will possibly need to explant some mesh   All questions were answered to the satisfaction of the patient.  The risk and benefits of incisional hernia repair with mesh were discussed including but not limited to bleeding, infection, injury to other organs, need for mesh, possibly not relieve symptoms, possible need to remove some of the prior mesh.  After careful consideration, Ashley [name] has decided to proceed.    Ashley [name] 09/24/2018, 11:44 AM

## 2018-09-30 ENCOUNTER — Ambulatory Visit (HOSPITAL_COMMUNITY): Payer: Medicaid Other | Admitting: Anesthesiology

## 2018-09-30 ENCOUNTER — Ambulatory Visit (HOSPITAL_COMMUNITY)
Admission: RE | Admit: 2018-09-30 | Discharge: 2018-09-30 | Disposition: A | Discharge status: Home / Self Care | Payer: Medicaid Other | Attending: General Surgery | Admitting: General Surgery

## 2018-09-30 ENCOUNTER — Encounter (HOSPITAL_COMMUNITY)
Admission: RE | Disposition: A | Discharge status: Home / Self Care | Payer: Self-pay | Source: Home / Self Care | Attending: General Surgery

## 2018-09-30 ENCOUNTER — Encounter (HOSPITAL_COMMUNITY): Payer: Self-pay | Admitting: *Deleted

## 2018-09-30 ENCOUNTER — Other Ambulatory Visit: Payer: Self-pay

## 2018-09-30 DIAGNOSIS — Z8249 Family history of ischemic heart disease and other diseases of the circulatory system: Secondary | ICD-10-CM | POA: Diagnosis not present

## 2018-09-30 DIAGNOSIS — B1920 Unspecified viral hepatitis C without hepatic coma: Secondary | ICD-10-CM | POA: Diagnosis not present

## 2018-09-30 DIAGNOSIS — F329 Major depressive disorder, single episode, unspecified: Secondary | ICD-10-CM | POA: Diagnosis not present

## 2018-09-30 DIAGNOSIS — F17210 Nicotine dependence, cigarettes, uncomplicated: Secondary | ICD-10-CM | POA: Diagnosis not present

## 2018-09-30 DIAGNOSIS — F419 Anxiety disorder, unspecified: Secondary | ICD-10-CM | POA: Insufficient documentation

## 2018-09-30 DIAGNOSIS — I10 Essential (primary) hypertension: Secondary | ICD-10-CM | POA: Insufficient documentation

## 2018-09-30 DIAGNOSIS — J45909 Unspecified asthma, uncomplicated: Secondary | ICD-10-CM | POA: Insufficient documentation

## 2018-09-30 DIAGNOSIS — E119 Type 2 diabetes mellitus without complications: Secondary | ICD-10-CM | POA: Insufficient documentation

## 2018-09-30 DIAGNOSIS — Z21 Asymptomatic human immunodeficiency virus [HIV] infection status: Secondary | ICD-10-CM | POA: Diagnosis not present

## 2018-09-30 DIAGNOSIS — M419 Scoliosis, unspecified: Secondary | ICD-10-CM | POA: Diagnosis not present

## 2018-09-30 DIAGNOSIS — Z886 Allergy status to analgesic agent status: Secondary | ICD-10-CM | POA: Insufficient documentation

## 2018-09-30 DIAGNOSIS — K429 Umbilical hernia without obstruction or gangrene: Secondary | ICD-10-CM | POA: Insufficient documentation

## 2018-09-30 DIAGNOSIS — K432 Incisional hernia without obstruction or gangrene: Secondary | ICD-10-CM | POA: Diagnosis present

## 2018-09-30 HISTORY — PX: INCISIONAL HERNIA REPAIR: SHX193

## 2018-09-30 LAB — GLUCOSE, CAPILLARY
Glucose-Capillary: 92 mg/dL (ref 70–99)
Glucose-Capillary: 135 mg/dL — ABNORMAL HIGH (ref 70–99)

## 2018-09-30 SURGERY — REPAIR, HERNIA, INCISIONAL
Anesthesia: General | Site: Abdomen

## 2018-09-30 MED ORDER — EPHEDRINE SULFATE 50 MG/ML IJ SOLN
INTRAMUSCULAR | Status: DC | PRN
  Administered 2018-09-30: 5 mg via INTRAVENOUS

## 2018-09-30 MED ORDER — CEFAZOLIN SODIUM-DEXTROSE 2-4 GM/100ML-% IV SOLN
2.0000 g | INTRAVENOUS | Status: AC
  Administered 2018-09-30: 10:00:00 2 g via INTRAVENOUS

## 2018-09-30 MED ORDER — FENTANYL CITRATE (PF) 100 MCG/2ML IJ SOLN
INTRAMUSCULAR | Status: AC
  Filled 2018-09-30: qty 4

## 2018-09-30 MED ORDER — ONDANSETRON HCL 4 MG/2ML IJ SOLN: 4.0000 mg | Freq: Once | INTRAMUSCULAR | Status: DC | PRN

## 2018-09-30 MED ORDER — BUPIVACAINE LIPOSOME 1.3 % IJ SUSP
INTRAMUSCULAR | Status: AC
  Filled 2018-09-30: qty 20

## 2018-09-30 MED ORDER — FENTANYL CITRATE (PF) 100 MCG/2ML IJ SOLN
INTRAMUSCULAR | Status: DC | PRN
  Administered 2018-09-30 (×2): 50 ug via INTRAVENOUS

## 2018-09-30 MED ORDER — SUCCINYLCHOLINE CHLORIDE 200 MG/10ML IV SOSY
PREFILLED_SYRINGE | INTRAVENOUS | Status: AC
  Filled 2018-09-30: qty 10

## 2018-09-30 MED ORDER — ROCURONIUM BROMIDE 100 MG/10ML IV SOLN
INTRAVENOUS | Status: DC | PRN
  Administered 2018-09-30: 30 mg via INTRAVENOUS

## 2018-09-30 MED ORDER — CHLORHEXIDINE GLUCONATE CLOTH 2 % EX PADS: 6.0000 | MEDICATED_PAD | Freq: Once | CUTANEOUS | Status: DC

## 2018-09-30 MED ORDER — PHENYLEPHRINE HCL (PRESSORS) 10 MG/ML IV SOLN
INTRAVENOUS | Status: DC | PRN
  Administered 2018-09-30: 40 ug via INTRAVENOUS

## 2018-09-30 MED ORDER — ROCURONIUM BROMIDE 10 MG/ML (PF) SYRINGE
PREFILLED_SYRINGE | INTRAVENOUS | Status: AC
  Filled 2018-09-30: qty 10

## 2018-09-30 MED ORDER — MIDAZOLAM HCL 2 MG/2ML IJ SOLN
INTRAMUSCULAR | Status: AC
  Filled 2018-09-30: qty 2

## 2018-09-30 MED ORDER — HYDROMORPHONE HCL 1 MG/ML IJ SOLN
0.2500 mg | INTRAMUSCULAR | Status: AC | PRN
  Administered 2018-09-30 (×4): 0.25 mg via INTRAVENOUS
  Filled 2018-09-30: qty 0.5

## 2018-09-30 MED ORDER — OXYCODONE HCL 5 MG PO TABS: 5.0000 mg | ORAL_TABLET | ORAL | 0 refills | Status: DC | PRN

## 2018-09-30 MED ORDER — LIDOCAINE HCL 1 % IJ SOLN
INTRAMUSCULAR | Status: DC | PRN
  Administered 2018-09-30: 50 mg via INTRADERMAL

## 2018-09-30 MED ORDER — 0.9 % SODIUM CHLORIDE (POUR BTL) OPTIME
TOPICAL | Status: DC | PRN
  Administered 2018-09-30: 10:00:00 1000 mL

## 2018-09-30 MED ORDER — DOCUSATE SODIUM 100 MG PO CAPS: 100.0000 mg | ORAL_CAPSULE | Freq: Two times a day (BID) | ORAL | 2 refills | Status: DC

## 2018-09-30 MED ORDER — MIDAZOLAM HCL 5 MG/5ML IJ SOLN
INTRAMUSCULAR | Status: DC | PRN
  Administered 2018-09-30: 2 mg via INTRAVENOUS

## 2018-09-30 MED ORDER — ONDANSETRON HCL 4 MG/2ML IJ SOLN
INTRAMUSCULAR | Status: DC | PRN
  Administered 2018-09-30: 4 mg via INTRAVENOUS

## 2018-09-30 MED ORDER — LACTATED RINGERS IV SOLN
Freq: Once | INTRAVENOUS | Status: AC
  Administered 2018-09-30: 09:00:00 1000 mL via INTRAVENOUS

## 2018-09-30 MED ORDER — PROPOFOL 10 MG/ML IV BOLUS
INTRAVENOUS | Status: DC | PRN
  Administered 2018-09-30: 150 mg via INTRAVENOUS

## 2018-09-30 MED ORDER — SUGAMMADEX SODIUM 200 MG/2ML IV SOLN
INTRAVENOUS | Status: DC | PRN
  Administered 2018-09-30: 100 mg via INTRAVENOUS

## 2018-09-30 MED ORDER — PROPOFOL 10 MG/ML IV BOLUS
INTRAVENOUS | Status: AC
  Filled 2018-09-30: qty 40

## 2018-09-30 MED ORDER — BUPIVACAINE LIPOSOME 1.3 % IJ SUSP
INTRAMUSCULAR | Status: DC | PRN
  Administered 2018-09-30: 20 mL

## 2018-09-30 MED ORDER — ONDANSETRON HCL 4 MG/2ML IJ SOLN
INTRAMUSCULAR | Status: AC
  Filled 2018-09-30: qty 2

## 2018-09-30 MED ORDER — HYDROMORPHONE HCL 1 MG/ML IJ SOLN
INTRAMUSCULAR | Status: AC
  Filled 2018-09-30: qty 0.5

## 2018-09-30 MED ORDER — FENTANYL CITRATE (PF) 100 MCG/2ML IJ SOLN
INTRAMUSCULAR | Status: AC
  Filled 2018-09-30: qty 2

## 2018-09-30 MED ORDER — LACTATED RINGERS IV SOLN
INTRAVENOUS | Status: DC | PRN
  Administered 2018-09-30: 08:00:00 via INTRAVENOUS

## 2018-09-30 MED ORDER — CEFAZOLIN SODIUM-DEXTROSE 2-4 GM/100ML-% IV SOLN
INTRAVENOUS | Status: AC
  Filled 2018-09-30: qty 100

## 2018-09-30 MED ORDER — MEPERIDINE HCL 50 MG/ML IJ SOLN: 6.2500 mg | INTRAMUSCULAR | Status: DC | PRN

## 2018-09-30 MED ORDER — SUCCINYLCHOLINE CHLORIDE 20 MG/ML IJ SOLN
INTRAMUSCULAR | Status: DC | PRN
  Administered 2018-09-30: 160 mg via INTRAVENOUS

## 2018-09-30 SURGICAL SUPPLY — 47 items
ADH SKN CLS APL DERMABOND .7 (GAUZE/BANDAGES/DRESSINGS) ×1
APL PRP STRL LF DISP 70% ISPRP (MISCELLANEOUS) ×1
BLADE SURG SZ11 CARB STEEL (BLADE) ×3 IMPLANT
CHLORAPREP W/TINT 26 (MISCELLANEOUS) ×3 IMPLANT
CLOTH BEACON ORANGE TIMEOUT ST (SAFETY) ×3 IMPLANT
COVER LIGHT HANDLE STERIS (MISCELLANEOUS) ×6 IMPLANT
COVER WAND RF STERILE (DRAPES) ×4 IMPLANT
DECANTER SPIKE VIAL GLASS SM (MISCELLANEOUS) ×2 IMPLANT
DERMABOND ADVANCED (GAUZE/BANDAGES/DRESSINGS) ×2
DERMABOND ADVANCED .7 DNX12 (GAUZE/BANDAGES/DRESSINGS) IMPLANT
DRSG TEGADERM 4X4.75 (GAUZE/BANDAGES/DRESSINGS) ×2 IMPLANT
ELECT REM PT RETURN 9FT ADLT (ELECTROSURGICAL) ×3
ELECTRODE REM PT RTRN 9FT ADLT (ELECTROSURGICAL) ×1 IMPLANT
GAUZE SPONGE 4X4 12PLY STRL (GAUZE/BANDAGES/DRESSINGS) ×3 IMPLANT
GLOVE BIO SURGEON STRL SZ 6.5 (GLOVE) ×4 IMPLANT
GLOVE BIO SURGEON STRL SZ7 (GLOVE) ×4 IMPLANT
GLOVE BIO SURGEONS STRL SZ 6.5 (GLOVE) ×3
GLOVE BIOGEL PI IND STRL 6.5 (GLOVE) ×1 IMPLANT
GLOVE BIOGEL PI IND STRL 7.0 (GLOVE) ×1 IMPLANT
GLOVE BIOGEL PI INDICATOR 6.5 (GLOVE) ×6
GLOVE BIOGEL PI INDICATOR 7.0 (GLOVE) ×4
GLOVE SURG SS PI 7.5 STRL IVOR (GLOVE) ×3 IMPLANT
GOWN STRL REUS W/ TWL LRG LVL3 (GOWN DISPOSABLE) ×1 IMPLANT
GOWN STRL REUS W/ TWL XL LVL3 (GOWN DISPOSABLE) ×1 IMPLANT
GOWN STRL REUS W/TWL LRG LVL3 (GOWN DISPOSABLE) ×11 IMPLANT
GOWN STRL REUS W/TWL XL LVL3 (GOWN DISPOSABLE) ×3
INST SET MAJOR GENERAL (KITS) IMPLANT
INST SET MINOR GENERAL (KITS) ×3 IMPLANT
KIT TURNOVER KIT A (KITS) ×3 IMPLANT
MANIFOLD NEPTUNE II (INSTRUMENTS) ×3 IMPLANT
MESH VENTRALEX ST 2.5 CRC MED (Mesh General) ×2 IMPLANT
NDL HYPO 21X1.5 SAFETY (NEEDLE) ×1 IMPLANT
NEEDLE HYPO 21X1.5 SAFETY (NEEDLE) ×3 IMPLANT
NS IRRIG 1000ML POUR BTL (IV SOLUTION) ×3 IMPLANT
PACK ABDOMINAL MAJOR (CUSTOM PROCEDURE TRAY) IMPLANT
PACK MINOR (CUSTOM PROCEDURE TRAY) ×3 IMPLANT
PAD ARMBOARD 7.5X6 YLW CONV (MISCELLANEOUS) ×3 IMPLANT
SET BASIN LINEN APH (SET/KITS/TRAYS/PACK) ×3 IMPLANT
SPONGE GAUZE 2X2 8PLY STER LF (GAUZE/BANDAGES/DRESSINGS) ×1
SPONGE GAUZE 2X2 8PLY STRL LF (GAUZE/BANDAGES/DRESSINGS) ×1 IMPLANT
SUT ETHIBOND 0 MO6 C/R (SUTURE) ×4 IMPLANT
SUT PROLENE 2 0 SH 30 (SUTURE) IMPLANT
SUT VIC AB 2-0 CT1 27 (SUTURE) ×3
SUT VIC AB 2-0 CT1 TAPERPNT 27 (SUTURE) ×1 IMPLANT
SUT VIC AB 3-0 SH 27 (SUTURE) ×3
SUT VIC AB 3-0 SH 27X BRD (SUTURE) ×1 IMPLANT
SYR 20CC LL (SYRINGE) ×3 IMPLANT

## 2018-09-30 NOTE — Anesthesia Procedure Notes (Signed)
Procedure Name: Intubation Date/Time: 09/30/2018 9:42 AM Performed by: Charmaine Downs, CRNA Pre-anesthesia Checklist: Patient identified, Patient being monitored, Timeout performed, Emergency Drugs available and Suction available Patient Re-evaluated:Patient Re-evaluated prior to induction Oxygen Delivery Method: Circle System Utilized Preoxygenation: Pre-oxygenation with 100% oxygen Induction Type: IV induction Ventilation: Mask ventilation without difficulty Laryngoscope Size: Mac and 3 Grade View: Grade I Tube type: Oral Tube size: 7.0 mm Number of attempts: 1 Airway Equipment and Method: stylet Placement Confirmation: ETT inserted through vocal cords under direct vision,  positive ETCO2 and breath sounds checked- equal and bilateral Secured at: 22 cm Tube secured with: Tape Dental Injury: Teeth and Oropharynx as per pre-operative assessment

## 2018-09-30 NOTE — Op Note (Signed)
[facility] Operative Note  09/30/18  Preoperative Diagnosis:  Recurrent incisional hernia    Postoperative Diagnosis: Same   Procedure(s) Performed: Explant of prior mesh, Incisional hernia repair with mesh (Ventralex ST patch 6.4cm)    Surgeon: Lanell Matar. Constance Haw, MD   Assistants: No qualified resident was available    Anesthesia: General endotracheal   Anesthesiologist: Denese Killings, MD    Specimens:  Explanted mesh, omentum    Estimated Blood Loss: Minimal   Blood Replacement: None    Complications: None    Wound Class: Clean    Operative Indications: Ashley [name] is a 53 yo with a prior umbilical/ epigastric hernia repair with mesh inlay in 2006 that comes in complaining of pain and bulging in the umbilical hernia.  She has had CT scans that demonstrated diastasis but no obvious hernia. I had her complete a colonoscopy prior to discussion of repair and that was done with no concerning findings.  She came to the office and on exam, I felt possible recurrence with some bulging omentum, and we discussed that some of the pain could be from this and some could be from the mesh itself. We discussed exploration with plans for hernia repair and possible mesh explant. We discussed the risk of bleeding, infection, use of mesh, explant of part of her mesh, injury to other organs, and recurrence. She has opted to proceed.   Findings: Umbilical repair with omentum coming between inlay mesh that had incorporated and fascia    Procedure: The patient was taken to the operating room and placed supine. General endotracheal anesthesia was induced. Intravenous antibiotics were administered per protocol.  An orogastric tube positioned to decompress the stomach. The abdomen was prepared and draped in the usual sterile fashion.   A vertical incision was made in the patient's prior incisions over the umbilical area and point of pain which I had marked when the patient was  awake.  This was carried down with care to the fascia/ prior mesh inlay.  I visualized several permanent sutures bridging the incorporated mesh and fascia.  In the superior left upper portion there was omentum coming from the peritoneal cavity. This was small be definitely a recurrence.  I cleared the edges of the inlay, and was able to enter the peritoneal cavity through this defect. Using great care, I was able to remove this portion inlay mesh as the omentum had completely fused to this portion of the mesh.  The mesh and omentum were sent to pathology.  The omentum was ligated with a 3-0 Vicryl suture.   The peritoneal cavity was cleared under to allow for mesh placement. A Ventralex ST 6.4cm Patch was placed and secured with 0 Ethibond suture under the fascia in 4 points. Additional 0 Ethibond was used to closed the fascia in the midline in an interrupted fashion.   The deep space was closed with 3-0 Vicryl and attempts at remaking an umbilicus was made. Final inspection revealed acceptable hemostasis.  The skin was closed with a 4-0 Monocryl subdermal and dermabond.  The umbilicus was compressed with a 2x2 folded and tegaderm to help aid in umbilicus healing.   All counts were correct at the end of the case. The patient was awakened from anesthesia and extubate without complication.  The patient went to the PACU in stable condition.   Curlene Labrum, MD [facility] [address] [place],  [address] [phone] (office)

## 2018-09-30 NOTE — Interval H&P Note (Signed)
History and Physical Interval Note:  09/30/2018 9:08 AM  Ashley [name]  has presented today for surgery, with the diagnosis of recurrent incisional hernia.  The various methods of treatment have been discussed with the patient and family. After consideration of risks, benefits and other options for treatment, the patient has consented to  Procedure(s): OPEN HERNIA REPAIR INCISIONAL WITH MESH (N/A) as a surgical intervention.  The patient's history has been reviewed, patient examined, no change in status, stable for surgery.  I have reviewed the patient's chart and labs.  Questions were answered to the patient's satisfaction.     No changes since last saw patient. Asked her about the elevated HIV viral loads in 04/2018 which I did not find until after her visit.  She reports stopping her meds then. She assures me she is on her HIV medication. She has follow up with ID this month.    Virl Cagey

## 2018-09-30 NOTE — Transfer of Care (Signed)
Immediate Anesthesia Transfer of Care Note  Patient: Ashley [name]  Procedure(s) Performed: OPEN HERNIA REPAIR INCISIONAL WITH MESH (N/A Abdomen)  Patient Location: PACU  Anesthesia Type:General  Level of Consciousness: drowsy and patient cooperative  Airway & Oxygen Therapy: Patient Spontanous Breathing and Patient connected to nasal cannula oxygen  Post-op Assessment: Report given to RN, Post -op Vital signs reviewed and stable and Patient moving all extremities  Post vital signs: Reviewed and stable  Last Vitals:  Vitals Value Taken Time  BP    Temp    Pulse    Resp    SpO2      Last Pain:  Vitals:   09/30/18 0808  TempSrc: Oral  PainSc: 0-No pain      Patients Stated Pain Goal: 6 (36/06/77 0340)  Complications: No apparent anesthesia complications

## 2018-09-30 NOTE — Discharge Instructions (Signed)
Discharge Instructions:  Common Complaints: Pain at the incision site is common. This will improve with time. Take your pain medications as described below. Some nausea is common and poor appetite. The main goal is to stay hydrated the first few days after surgery.   Diet/ Activity: Diet as tolerated.  You may not have a large appetite, but it is important to stay hydrated. Drink 64 ounces of water a day. Your appetite will return with time.  Keep your dressing in place for 2 days following the surgery and then remove the dressing.  If the gauze is stuck to the glue, then gently cut the gauze and leave any pieces attached to the glue so you do not pull the glue off.  You can shower after the bandage is removed.  Do not take hot showers. Take warm showers that are less than 10 minutes. Pat the incision dry. Wear an abdominal binder daily with activity. You do not have to wear this while sleeping or sitting.  Rest and listen to your body, but do not remain in bed all day.  Walk everyday for at least 15-20 minutes. Deep cough and move around every 1-2 hours in the first few days after surgery.  Do not lift > 10 lbs, perform excessive bending, pushing, pulling, squatting for 6-8 weeks after surgery.  The activity restrictions and the abdominal binder are to prevent hernia formation at your incision while you are healing.  Do not place lotions or balms on your incision unless instructed to specifically by Dr. Constance Haw.   Medication: Take tylenol and ibuprofen as needed for pain control, alternating every 4-6 hours.  Example:  Tylenol 1000mg  @ 6am, 12noon, 6pm, 63midnight (Do not exceed 4000mg  of tylenol a day). Ibuprofen 800mg  @ 9am, 3pm, 9pm, 3am (Do not exceed 3600mg  of ibuprofen a day).  Take Roxicodone for breakthrough pain every 4 hours.  Take Colace for constipation related to narcotic pain medication. If you do not have a bowel movement in 2 days, take Miralax over the counter.  Drink  plenty of water to also prevent constipation.   Contact Information: If you have questions or concerns, please call our office, [phone], Monday- Thursday 8AM-5PM and Friday 8AM-12Noon.  If it is after hours or on the weekend, please call [facility], [phone], and ask to speak to the surgeon on call for Dr. Constance Haw at [facility].    Open Hernia Repair, Adult, Care After These instructions give you information about caring for yourself after your procedure. Your doctor may also give you more specific instructions. If you have problems or questions, contact your doctor. Follow these instructions at home: Surgical cut (incision) care   Follow instructions from your doctor about how to take care of your surgical cut area. Make sure you: ? Wash your hands with soap and water before you change your bandage (dressing). If you cannot use soap and water, use hand sanitizer. ? Change your bandage as told by your doctor. ? Leave stitches (sutures), skin glue, or skin tape (adhesive) strips in place. They may need to stay in place for 2 weeks or longer. If tape strips get loose and curl up, you may trim the loose edges. Do not remove tape strips completely unless your doctor says it is okay.  Check your surgical cut every day for signs of infection. Check for: ? More redness, swelling, or pain. ? More fluid or blood. ? Warmth. ? Pus or a bad smell. Activity  Do not  drive or use heavy machinery while taking prescription pain medicine. Do not drive until your doctor says it is okay.  Until your doctor says it is okay: ? Do not lift anything that is heavier than 10 lb (4.5 kg). ? Do not play contact sports.  Return to your normal activities as told by your doctor. Ask your doctor what activities are safe. General instructions  To prevent or treat having a hard time pooping (constipation) while you are taking prescription pain medicine, your doctor may recommend that you: ? Drink  enough fluid to keep your pee (urine) clear or pale yellow. ? Take over-the-counter or prescription medicines. ? Eat foods that are high in fiber, such as fresh fruits and vegetables, whole grains, and beans. ? Limit foods that are high in fat and processed sugars, such as fried and sweet foods.  Take over-the-counter and prescription medicines only as told by your doctor.  Do not take baths, swim, or use a hot tub until your doctor says it is okay.  You can take showers.   Keep all follow-up visits as told by your doctor. This is important. Contact a doctor if:  You develop a rash.  You have more redness, swelling, or pain around your surgical cut.  You have more fluid or blood coming from your surgical cut.  Your surgical cut feels warm to the touch.  You have pus or a bad smell coming from your surgical cut.  You have a fever or chills.  You have blood in your poop (stool).  You have not pooped in 2-3 days.  Medicine does not help your pain. Get help right away if:  You have chest pain or you are short of breath.  You feel light-headed.  You feel weak and dizzy (feel faint).  You have very bad pain.  You throw up (vomit) and your pain is worse. This information is not intended to replace advice given to you by your health care provider. Make sure you discuss any questions you have with your health care provider. Document Released: 03/06/2014 Document Revised: 06/07/2018 Document Reviewed: 07/28/2015 Elsevier Patient Education  2020 [place] Anesthesia, Adult, Care After This sheet gives you information about how to care for yourself after your procedure. Your health care provider may also give you more specific instructions. If you have problems or questions, contact your health care provider. What can I expect after the procedure? After the procedure, the following side effects are common:  Pain or discomfort at the IV  site.  Nausea.  Vomiting.  Sore throat.  Trouble concentrating.  Feeling cold or chills.  Weak or tired.  Sleepiness and fatigue.  Soreness and body aches. These side effects can affect parts of the body that were not involved in surgery. Follow these instructions at home:  For at least 24 hours after the procedure:  Have a responsible adult stay with you. It is important to have someone help care for you until you are awake and alert.  Rest as needed.  Do not: ? Participate in activities in which you could fall or become injured. ? Drive. ? Use heavy machinery. ? Drink alcohol. ? Take sleeping pills or medicines that cause drowsiness. ? Make important decisions or sign legal documents. ? Take care of children on your own. Eating and drinking  Follow any instructions from your health care provider about eating or drinking restrictions.  When you feel hungry, start by eating small  amounts of foods that are soft and easy to digest (bland), such as toast. Gradually return to your regular diet.  Drink enough fluid to keep your urine pale yellow.  If you vomit, rehydrate by drinking water, juice, or clear broth. General instructions  If you have sleep apnea, surgery and certain medicines can increase your risk for breathing problems. Follow instructions from your health care provider about wearing your sleep device: ? Anytime you are sleeping, including during daytime naps. ? While taking prescription pain medicines, sleeping medicines, or medicines that make you drowsy.  Return to your normal activities as told by your health care provider. Ask your health care provider what activities are safe for you.  Take over-the-counter and prescription medicines only as told by your health care provider.  If you smoke, do not smoke without supervision.  Keep all follow-up visits as told by your health care provider. This is important. Contact a health care provider if:  You  have nausea or vomiting that does not get better with medicine.  You cannot eat or drink without vomiting.  You have pain that does not get better with medicine.  You are unable to pass urine.  You develop a skin rash.  You have a fever.  You have redness around your IV site that gets worse. Get help right away if:  You have difficulty breathing.  You have chest pain.  You have blood in your urine or stool, or you vomit blood. Summary  After the procedure, it is common to have a sore throat or nausea. It is also common to feel tired.  Have a responsible adult stay with you for the first 24 hours after general anesthesia. It is important to have someone help care for you until you are awake and alert.  When you feel hungry, start by eating small amounts of foods that are soft and easy to digest (bland), such as toast. Gradually return to your regular diet.  Drink enough fluid to keep your urine pale yellow.  Return to your normal activities as told by your health care provider. Ask your health care provider what activities are safe for you. This information is not intended to replace advice given to you by your health care provider. Make sure you discuss any questions you have with your health care provider. Document Released: 05/22/2000 Document Revised: 02/16/2017 Document Reviewed: 09/29/2016 Elsevier Patient Education  2020 [employer].

## 2018-09-30 NOTE — Progress Notes (Signed)
[facility]  McBee, son, everything went well. Recurrent hernia repair and part of old mesh explanted.  Curlene Labrum, MD [facility] [address] [place], [place] [address] [phone] (office)

## 2018-09-30 NOTE — Anesthesia Preprocedure Evaluation (Addendum)
Anesthesia Evaluation  Patient identified by MRN, date of birth, ID band Patient awake    Airway Mallampati: II  TM Distance: >3 FB     Dental  (+) Missing   Pulmonary asthma , COPD, Current Smoker,  H/o TB   breath sounds clear to auscultation       Cardiovascular hypertension,  Rhythm:Regular Rate:Normal  DANIKAH, BUDZIK [id number] 27-Sep-2018 14:22:46 [facility] System-AP-300 ROUTINE RECORD Sinus rhythm Left axis deviation Low voltage QRS Septal infarct , age undetermined Abnormal ECG Confirmed by Asencion Noble [phone]) on 09/29/2018 10:33:30 AM   Neuro/Psych PSYCHIATRIC DISORDERS Anxiety Depression Bipolar Disorder Schizophrenia  Neuromuscular disease    GI/Hepatic (+)     substance abuse (last use - more than year )  cocaine use, Hepatitis -, CLiver fibrosis    Endo/Other  diabetes, Type 2Hypothyroidism   Renal/GU      Musculoskeletal   Abdominal   Peds  Hematology  (+) HIV,   Anesthesia Other Findings   Reproductive/Obstetrics                            Anesthesia Physical Anesthesia Plan  ASA: III  Anesthesia Plan: General   Post-op Pain Management:    Induction: Intravenous  PONV Risk Score and Plan: 2 and Ondansetron and Metaclopromide  Airway Management Planned: Oral ETT  Additional Equipment:   Intra-op Plan:   Post-operative Plan:   Informed Consent: I have reviewed the patients History and Physical, chart, labs and discussed the procedure including the risks, benefits and alternatives for the proposed anesthesia with the patient or authorized representative who has indicated his/her understanding and acceptance.       Plan Discussed with:   Anesthesia Plan Comments:         Anesthesia Quick Evaluation

## 2018-09-30 NOTE — Anesthesia Postprocedure Evaluation (Signed)
Anesthesia Post Note  Patient: Ashley [name]  Procedure(s) Performed: OPEN HERNIA REPAIR INCISIONAL WITH MESH (N/A Abdomen)  Patient location during evaluation: PACU Anesthesia Type: General Level of consciousness: awake and patient cooperative Pain management: pain level controlled Vital Signs Assessment: post-procedure vital signs reviewed and stable Respiratory status: spontaneous breathing and respiratory function stable Cardiovascular status: blood pressure returned to baseline Postop Assessment: no apparent nausea or vomiting Anesthetic complications: no     Last Vitals:  Vitals:   09/30/18 1134 09/30/18 1145  BP:  106/79  Pulse: 72 79  Resp: 17 16  Temp:    SpO2: 90% 90%    Last Pain:  Vitals:   09/30/18 1129  TempSrc:   PainSc: 5                  Bryer Gottsch J

## 2018-10-01 ENCOUNTER — Encounter: Payer: Medicaid Other | Admitting: Obstetrics and Gynecology

## 2018-10-01 ENCOUNTER — Encounter (HOSPITAL_COMMUNITY): Payer: Self-pay | Admitting: General Surgery

## 2018-10-01 ENCOUNTER — Other Ambulatory Visit: Payer: Self-pay | Admitting: *Deleted

## 2018-10-01 DIAGNOSIS — B20 Human immunodeficiency virus [HIV] disease: Secondary | ICD-10-CM

## 2018-10-02 ENCOUNTER — Other Ambulatory Visit: Payer: Medicaid Other

## 2018-10-02 ENCOUNTER — Telehealth: Payer: Self-pay | Admitting: Family Medicine

## 2018-10-02 ENCOUNTER — Other Ambulatory Visit: Payer: Self-pay | Admitting: *Deleted

## 2018-10-02 MED ORDER — MULTI-VITAMINS PO TABS: 1.0000 | ORAL_TABLET | Freq: Every day | ORAL | 1 refills | Status: DC

## 2018-10-03 ENCOUNTER — Telehealth: Payer: Self-pay

## 2018-10-03 DIAGNOSIS — K432 Incisional hernia without obstruction or gangrene: Secondary | ICD-10-CM

## 2018-10-03 MED ORDER — TRAMADOL HCL 50 MG PO TABS: 50.0000 mg | ORAL_TABLET | Freq: Four times a day (QID) | ORAL | 0 refills | Status: DC | PRN

## 2018-10-03 NOTE — Telephone Encounter (Signed)
Patient called stating that she thinks the oxycodone is causing an allergic reaction and making her itch. Told patient to stop taking medication until I can talk with MD, patient says she took another one this morning and is already itching.

## 2018-10-07 ENCOUNTER — Telehealth: Payer: Self-pay

## 2018-10-07 NOTE — Telephone Encounter (Signed)
Patient left voice mail stating that Tramadol is not strong enough for her pain and she is having to take 2 pills at a time. MD notified.

## 2018-10-08 ENCOUNTER — Other Ambulatory Visit: Payer: Self-pay | Admitting: Family Medicine

## 2018-10-08 DIAGNOSIS — J439 Emphysema, unspecified: Secondary | ICD-10-CM

## 2018-10-16 ENCOUNTER — Encounter: Payer: Medicaid Other | Admitting: Internal Medicine

## 2018-10-23 ENCOUNTER — Encounter: Payer: Medicaid Other | Admitting: Obstetrics & Gynecology

## 2018-10-29 ENCOUNTER — Other Ambulatory Visit: Payer: Self-pay

## 2018-10-29 ENCOUNTER — Ambulatory Visit (INDEPENDENT_AMBULATORY_CARE_PROVIDER_SITE_OTHER): Payer: Self-pay | Admitting: General Surgery

## 2018-10-29 ENCOUNTER — Encounter: Payer: Self-pay | Admitting: General Surgery

## 2018-10-29 VITALS — BP 120/81 | HR 60 | Temp 96.9°F | Resp 16 | Ht <= 58 in | Wt 113.0 lb

## 2018-10-29 DIAGNOSIS — K432 Incisional hernia without obstruction or gangrene: Secondary | ICD-10-CM

## 2018-10-29 NOTE — Patient Instructions (Signed)
No heavy lifting until 8 weeks after your surgery, no lifting > 10 lbs, excessive bending, pushing, pulling.  Diet as tolerated. Abdominal binder as tolerated.

## 2018-10-29 NOTE — Progress Notes (Signed)
[facility] Note   HPI:  53 y.o. Female presents to clinic for post-op follow-up evaluation after incisional hernia repair with mesh and explant of her prior mesh. She is doing well and having no pain. She says she is feeling much better. She denies any nausea/vomiting and is having regular Bms.   Review of Systems:  No fever or chills Tolerating diet No pain All other review of systems: otherwise negative   Vital Signs:  BP 120/81 (BP Location: Left Arm, Patient Position: Sitting, Cuff Size: Normal)   Pulse 60   Temp (!) 96.9 F (36.1 C) (Tympanic)   Resp 16   Ht 4\' 7"  (1.397 m)   Wt 113 lb (51.3 kg)   SpO2 96%   BMI 26.26 kg/m    Physical Exam:  Physical Exam Vitals signs reviewed.  HENT:     Head: Normocephalic.  Cardiovascular:     Rate and Rhythm: Normal rate.  Pulmonary:     Effort: Pulmonary effort is normal.  Abdominal:     Palpations: Abdomen is soft.     Tenderness: There is no abdominal tenderness.     Hernia: No hernia is present.     Comments: Induration at the incision, scarring, well healed, no erythema or drainage  Neurological:     Mental Status: She is alert.    Assessment:  52 y.o. yo Female s/p open incisional hernia repair with mesh and explant of prior mesh. Doing well.   Plan:  - Wait until 8 weeks post op to lift anything > 10 lbs, excessive bending, pushing, pulling - Diet as tolerated - Abdominal binder as needed   - Follow up PRN  All of the above recommendations were discussed with the patient and all of patient's questions were answered to her expressed satisfaction.  Curlene Labrum, MD [facility] [address] [place], [place] [address] [phone] (office)

## 2018-11-07 ENCOUNTER — Other Ambulatory Visit: Payer: Self-pay | Admitting: Family Medicine

## 2018-11-07 DIAGNOSIS — J439 Emphysema, unspecified: Secondary | ICD-10-CM

## 2018-11-08 NOTE — Telephone Encounter (Signed)
Gottschalk. NTBS RF 10/22/18

## 2018-11-13 ENCOUNTER — Ambulatory Visit: Payer: Medicaid Other | Admitting: Obstetrics & Gynecology

## 2018-11-13 ENCOUNTER — Other Ambulatory Visit: Payer: Self-pay

## 2018-11-13 NOTE — Progress Notes (Signed)
Pt is here for colposcopy. Abnormal pap 04/24/18 LSIL. Dr. Roselie Awkward recommends that the patient return in 03/2019 for repeat pap smear instead of colpo today. Pt made aware and verbalized understanding.

## 2018-11-14 ENCOUNTER — Other Ambulatory Visit: Payer: Medicaid Other

## 2018-11-14 DIAGNOSIS — B20 Human immunodeficiency virus [HIV] disease: Secondary | ICD-10-CM

## 2018-11-14 NOTE — Progress Notes (Signed)
Patient seen and assessed by nursing staff during this encounter. I have reviewed the chart and agree with the documentation and plan.  Emeterio Reeve, MD 11/14/2018 2:09 PM

## 2018-11-16 LAB — HELPER T-LYMPH-CD4 (ARMC ONLY)
% CD 4 Pos. Lymph.: 33 % (ref 30.8–58.5)
Absolute CD 4 Helper: 792 /uL (ref 359–1519)
Basophils Absolute: 0 10*3/uL (ref 0.0–0.2)
Basos: 1 %
EOS (ABSOLUTE): 0.2 10*3/uL (ref 0.0–0.4)
Eos: 4 %
Hematocrit: 37.3 % (ref 34.0–46.6)
Hemoglobin: 12.2 g/dL (ref 11.1–15.9)
Immature Grans (Abs): 0 10*3/uL (ref 0.0–0.1)
Immature Granulocytes: 0 %
Lymphocytes Absolute: 2.4 10*3/uL (ref 0.7–3.1)
Lymphs: 46 %
MCH: 33.9 pg — ABNORMAL HIGH (ref 26.6–33.0)
MCHC: 32.7 g/dL (ref 31.5–35.7)
MCV: 104 fL — ABNORMAL HIGH (ref 79–97)
Monocytes Absolute: 0.5 10*3/uL (ref 0.1–0.9)
Monocytes: 9 %
Neutrophils Absolute: 2.1 10*3/uL (ref 1.4–7.0)
Neutrophils: 40 %
Platelets: 184 10*3/uL (ref 150–450)
RBC: 3.6 x10E6/uL — ABNORMAL LOW (ref 3.77–5.28)
RDW: 12 % (ref 11.7–15.4)
WBC: 5.3 10*3/uL (ref 3.4–10.8)

## 2018-11-19 LAB — CBC WITH DIFFERENTIAL/PLATELET
Absolute Monocytes: 459 cells/uL (ref 200–950)
Basophils Absolute: 41 cells/uL (ref 0–200)
Basophils Relative: 0.8 %
Eosinophils Absolute: 168 cells/uL (ref 15–500)
Eosinophils Relative: 3.3 %
HCT: 34.9 % — ABNORMAL LOW (ref 35.0–45.0)
Hemoglobin: 11.9 g/dL (ref 11.7–15.5)
Lymphs Abs: 2443 cells/uL (ref 850–3900)
MCH: 34 pg — ABNORMAL HIGH (ref 27.0–33.0)
MCHC: 34.1 g/dL (ref 32.0–36.0)
MCV: 99.7 fL (ref 80.0–100.0)
MPV: 10.3 fL (ref 7.5–12.5)
Monocytes Relative: 9 %
Neutro Abs: 1989 cells/uL (ref 1500–7800)
Neutrophils Relative %: 39 %
Platelets: 188 10*3/uL (ref 140–400)
RBC: 3.5 10*6/uL — ABNORMAL LOW (ref 3.80–5.10)
RDW: 12.1 % (ref 11.0–15.0)
Total Lymphocyte: 47.9 %
WBC: 5.1 10*3/uL (ref 3.8–10.8)

## 2018-11-19 LAB — COMPLETE METABOLIC PANEL WITH GFR
AG Ratio: 1.5 (calc) (ref 1.0–2.5)
ALT: 16 U/L (ref 6–29)
AST: 29 U/L (ref 10–35)
Albumin: 4.1 g/dL (ref 3.6–5.1)
Alkaline phosphatase (APISO): 76 U/L (ref 37–153)
BUN: 8 mg/dL (ref 7–25)
CO2: 32 mmol/L (ref 20–32)
Calcium: 9.2 mg/dL (ref 8.6–10.4)
Chloride: 107 mmol/L (ref 98–110)
Creat: 0.85 mg/dL (ref 0.50–1.05)
GFR, Est African American: 89 mL/min/{1.73_m2} (ref >=60)
GFR, Est Non African American: 77 mL/min/{1.73_m2} (ref >=60)
Globulin: 2.8 g/dL (calc) (ref 1.9–3.7)
Glucose, Bld: 81 mg/dL (ref 65–99)
Potassium: 4.2 mmol/L (ref 3.5–5.3)
Sodium: 143 mmol/L (ref 135–146)
Total Bilirubin: 0.5 mg/dL (ref 0.2–1.2)
Total Protein: 6.9 g/dL (ref 6.1–8.1)

## 2018-11-19 LAB — HIV-1 RNA QUANT-NO REFLEX-BLD
HIV 1 RNA Quant: <20 copies/mL
HIV-1 RNA Quant, Log: <1.3 Log copies/mL

## 2018-12-04 ENCOUNTER — Other Ambulatory Visit: Payer: Self-pay | Admitting: Family Medicine

## 2018-12-04 DIAGNOSIS — F339 Major depressive disorder, recurrent, unspecified: Secondary | ICD-10-CM

## 2018-12-04 MED ORDER — FLUOXETINE HCL 20 MG PO CAPS: 20.0000 mg | ORAL_CAPSULE | Freq: Every day | ORAL | 0 refills | Status: DC

## 2018-12-04 NOTE — Telephone Encounter (Signed)
Appointment scheduled rx sent to pharmacy

## 2018-12-04 NOTE — Telephone Encounter (Signed)
Gottschalk. NTBS. Last RF 09/30/18 LOV 02/2018

## 2018-12-04 NOTE — Addendum Note (Signed)
Addended by: Thana Ates on: 12/04/2018 04:27 PM   Modules accepted: Orders

## 2018-12-05 ENCOUNTER — Encounter: Payer: Medicaid Other | Admitting: Obstetrics and Gynecology

## 2018-12-06 ENCOUNTER — Telehealth: Payer: Self-pay

## 2018-12-06 ENCOUNTER — Encounter: Payer: Self-pay | Admitting: Obstetrics & Gynecology

## 2018-12-06 ENCOUNTER — Other Ambulatory Visit: Payer: Self-pay

## 2018-12-06 ENCOUNTER — Encounter: Payer: Medicaid Other | Admitting: Obstetrics & Gynecology

## 2018-12-06 NOTE — Telephone Encounter (Signed)
COVID-19 Pre-Screening Questions:12/06/18  Do you currently have a fever (>100 F), chills or unexplained body aches?NO  Are you currently experiencing new cough, shortness of breath, sore throat, runny nose? NO .  Marland Kitchen Have you recently travelled outside the state of [place] in the last 14 days?NO .  Have you been in contact with someone that is currently pending confirmation of Covid19 testing or has been confirmed to have the [place] virus?  NO  **If the patient answers NO to ALL questions -  advise the patient to please call the clinic before coming to the office should any symptoms develop.

## 2018-12-09 ENCOUNTER — Ambulatory Visit (INDEPENDENT_AMBULATORY_CARE_PROVIDER_SITE_OTHER): Payer: Medicaid Other | Admitting: Internal Medicine

## 2018-12-09 ENCOUNTER — Other Ambulatory Visit: Payer: Self-pay

## 2018-12-09 ENCOUNTER — Encounter: Payer: Self-pay | Admitting: Internal Medicine

## 2018-12-09 VITALS — BP 111/76 | HR 74 | Temp 98.4°F

## 2018-12-09 DIAGNOSIS — R634 Abnormal weight loss: Secondary | ICD-10-CM | POA: Diagnosis not present

## 2018-12-09 DIAGNOSIS — Z23 Encounter for immunization: Secondary | ICD-10-CM

## 2018-12-09 DIAGNOSIS — B20 Human immunodeficiency virus [HIV] disease: Secondary | ICD-10-CM

## 2018-12-09 DIAGNOSIS — B182 Chronic viral hepatitis C: Secondary | ICD-10-CM

## 2018-12-09 DIAGNOSIS — Z113 Encounter for screening for infections with a predominantly sexual mode of transmission: Secondary | ICD-10-CM

## 2018-12-09 MED ORDER — BIKTARVY 50-200-25 MG PO TABS: 1.0000 | ORAL_TABLET | Freq: Every day | ORAL | 6 refills | Status: DC

## 2018-12-09 MED ORDER — MEGESTROL ACETATE 625 MG/5ML PO SUSP: 625.0000 mg | Freq: Every day | ORAL | 6 refills | Status: DC

## 2018-12-09 NOTE — Progress Notes (Signed)
This encounter was created in error - please disregard.

## 2018-12-09 NOTE — Assessment & Plan Note (Signed)
Some recent weightloss by her account.  Will refill megace.

## 2018-12-09 NOTE — Progress Notes (Signed)
   Subjective:    Patient ID: Ashley [name], female    DOB: 12-Jun-1965, 53 y.o.   MRN: [id number]  HPI Here for follow up of HIV and HCV. She has been on Biktarvy but last visit had been off with a high viral load.  She restarted after being seen in January and her labs prior to this visit show good compliance with a suppressed viral load and CD4 of 792.  No missed doses since restarting.  Feels well today. Asking about megace.     Review of Systems  Constitutional: Negative for fatigue.  Gastrointestinal: Negative for diarrhea and nausea.  Neurological: Negative for dizziness.       Objective:   Physical Exam Constitutional:      Appearance: Normal appearance.  Eyes:     General: No scleral icterus. Cardiovascular:     Rate and Rhythm: Normal rate and regular rhythm.  Skin:    Findings: No rash.  Neurological:     General: No focal deficit present.     Mental Status: She is alert.  Psychiatric:        Mood and Affect: Mood normal.   SH: + tobacco       Assessment & Plan:

## 2018-12-09 NOTE — Assessment & Plan Note (Addendum)
Back on track, doing well.  No changes and rtc 4 months Flu shot today

## 2018-12-09 NOTE — Assessment & Plan Note (Signed)
Last viral load suppressed c/w cure.  Discussed with her and pleased with the result

## 2018-12-10 ENCOUNTER — Telehealth: Payer: Self-pay

## 2018-12-10 NOTE — Telephone Encounter (Signed)
Received a call today from Joellen Jersey, Pharmacist with [employer]. Pharmacist states that patient's megace is is requiring a PA before it can be dispensed.  Initiated PA through [employer] over the phone. PA represenative was able to initiate process, but PA is currently pending clinical review. Will need to contact [facility] [employer] within 24 hours for PA decision.  PA# [id number] Nctracks: [id number] Aundria Rud, CMA

## 2018-12-13 ENCOUNTER — Ambulatory Visit: Payer: Medicaid Other | Admitting: Family Medicine

## 2019-01-09 ENCOUNTER — Ambulatory Visit: Payer: Medicaid Other | Admitting: Family Medicine

## 2019-02-07 ENCOUNTER — Ambulatory Visit: Payer: Medicaid Other | Admitting: Family Medicine

## 2019-02-07 ENCOUNTER — Ambulatory Visit (INDEPENDENT_AMBULATORY_CARE_PROVIDER_SITE_OTHER): Payer: Medicaid Other | Admitting: Family Medicine

## 2019-02-07 DIAGNOSIS — M545 Low back pain: Secondary | ICD-10-CM | POA: Diagnosis not present

## 2019-02-07 DIAGNOSIS — J309 Allergic rhinitis, unspecified: Secondary | ICD-10-CM

## 2019-02-07 DIAGNOSIS — E1159 Type 2 diabetes mellitus with other circulatory complications: Secondary | ICD-10-CM

## 2019-02-07 DIAGNOSIS — I10 Essential (primary) hypertension: Secondary | ICD-10-CM

## 2019-02-07 DIAGNOSIS — F339 Major depressive disorder, recurrent, unspecified: Secondary | ICD-10-CM

## 2019-02-07 DIAGNOSIS — K219 Gastro-esophageal reflux disease without esophagitis: Secondary | ICD-10-CM

## 2019-02-07 DIAGNOSIS — B20 Human immunodeficiency virus [HIV] disease: Secondary | ICD-10-CM

## 2019-02-07 DIAGNOSIS — I152 Hypertension secondary to endocrine disorders: Secondary | ICD-10-CM

## 2019-02-07 DIAGNOSIS — B182 Chronic viral hepatitis C: Secondary | ICD-10-CM

## 2019-02-07 DIAGNOSIS — G8929 Other chronic pain: Secondary | ICD-10-CM

## 2019-02-07 MED ORDER — FLUTICASONE PROPIONATE 50 MCG/ACT NA SUSP: 2.0000 | Freq: Every day | NASAL | 3 refills | Status: DC

## 2019-02-07 MED ORDER — METHOCARBAMOL 500 MG PO TABS: 500.0000 mg | ORAL_TABLET | Freq: Three times a day (TID) | ORAL | 1 refills | Status: DC | PRN

## 2019-02-07 MED ORDER — PANTOPRAZOLE SODIUM 40 MG PO TBEC: 40.0000 mg | DELAYED_RELEASE_TABLET | Freq: Every day | ORAL | 3 refills | Status: DC

## 2019-02-07 MED ORDER — CETIRIZINE HCL 10 MG PO TABS: 10.0000 mg | ORAL_TABLET | Freq: Every day | ORAL | 3 refills | Status: DC

## 2019-02-07 MED ORDER — AMLODIPINE BESYLATE 10 MG PO TABS: 10.0000 mg | ORAL_TABLET | Freq: Every day | ORAL | 3 refills | Status: DC

## 2019-02-07 MED ORDER — FLUOXETINE HCL 20 MG PO CAPS: 20.0000 mg | ORAL_CAPSULE | Freq: Every day | ORAL | 3 refills | Status: DC

## 2019-02-07 NOTE — Progress Notes (Signed)
Telephone visit  Subjective: CC:HTN PCP: Janora Norlander, DO [id number] Ashley [name] is a 53 y.o. female calls for telephone consult today. Patient provides verbal consent for consult held via phone.  Location of patient: home Location of provider: [facility] Others present for call: none  1.  Hypertension Patient reports compliance with Norvasc 10 mg daily.  Denies any chest pain, shortness of breath, dizziness or falls.  She does not keep her blood pressure checked but notes that her blood pressure was within normal range at her most recent ID visit.  2.  HIV/hepatitis C Patient reports compliance with medications.  She has cleared the hepatitis C virus.  She is monitored by Dr. Linus Salmons with infectious disease and had a good checkup recently.  She has been maintaining weight with use of Megace.  She reports good appetite with this.  No fevers.  3.  GERD Patient reports compliance with Protonix.  She needs refills on this.  No nausea, vomiting, abdominal pain.  No hematochezia or melena  4.  Allergic rhinitis Patient reports compliance with Zyrtec and Flonase.  She needs refills on both.  No rhinorrhea, nasal congestion.   ROS: Per HPI  Allergies  Allergen Reactions  . Aspirin Other (See Comments)    Liver problems  . Roxicodone [Oxycodone Hcl] Itching   Past Medical History:  Diagnosis Date  . Ankle fracture   . Anxiety   . Asthma   . Collagen vascular disease ([place])   . COPD (chronic obstructive pulmonary disease) ([place])   . Depression   . Diabetes mellitus   . Elevated LFTs 01/13/2012  . Hepatitis C   . Hernia   . HIV (human immunodeficiency virus infection) ([place])   . HIV (human immunodeficiency virus infection) ([place])   . Hypertension   . Hypothyroidism   . Pinched nerve   . Scoliosis   . TB (tuberculosis)     Current Outpatient Medications:  .  albuterol (VENTOLIN HFA) 108 (90 Base) MCG/ACT inhaler, INHALE 2 PUFFS INTO THE LUNGS EVERY 6 HOURS AS NEEDED FOR WHEEZING  ORSHORTNESS OF BREATH *NEEDS AN APPOINTMENT*, Disp: 8.5 g, Rfl: 0 .  amLODipine (NORVASC) 10 MG tablet, Take 1 tablet (10 mg total) by mouth at bedtime., Disp: 90 tablet, Rfl: 3 .  bictegravir-emtricitabine-tenofovir AF (BIKTARVY) 50-200-25 MG TABS tablet, Take 1 tablet by mouth daily., Disp: 30 tablet, Rfl: 6 .  cetirizine (ZYRTEC) 10 MG tablet, Take 1 tablet (10 mg total) by mouth daily., Disp: 90 tablet, Rfl: 3 .  docusate sodium (COLACE) 100 MG capsule, Take 1 capsule (100 mg total) by mouth 2 (two) times daily., Disp: 60 capsule, Rfl: 2 .  FLUoxetine (PROZAC) 20 MG capsule, Take 1 capsule (20 mg total) by mouth daily., Disp: 90 capsule, Rfl: 3 .  fluticasone (FLONASE) 50 MCG/ACT nasal spray, Place 2 sprays into both nostrils daily., Disp: 48 g, Rfl: 3 .  megestrol (MEGACE ES) 625 MG/5ML suspension, Take 5 mLs (625 mg total) by mouth daily., Disp: 150 mL, Rfl: 6 .  methocarbamol (ROBAXIN) 500 MG tablet, Take 1 tablet (500 mg total) by mouth every 8 (eight) hours as needed for muscle spasms., Disp: 30 tablet, Rfl: 1 .  Multiple Vitamin (MULTI-VITAMINS) TABS, Take 1 tablet by mouth daily., Disp: 90 tablet, Rfl: 1 .  Omega-3 Fatty Acids (FISH OIL) 1000 MG CAPS, Take 1,000 mg by mouth at bedtime., Disp: , Rfl:  .  pantoprazole (PROTONIX) 40 MG tablet, Take 1 tablet (40 mg total) by mouth at  bedtime., Disp: 90 tablet, Rfl: 3 .  Psyllium 48.57 % POWD, Take 4 g by mouth at bedtime. (Patient taking differently: Take 1 packet by mouth daily as needed (for constipation). ), Disp: , Rfl: 0 .  senna (SENOKOT) 8.6 MG TABS tablet, Take 1 tablet (8.6 mg total) by mouth at bedtime as needed (constipation). (Patient taking differently: Take 1 tablet by mouth at bedtime as needed for moderate constipation. ), Disp: 90 tablet, Rfl: 2 .  vitamin B-12 (CYANOCOBALAMIN) 50 MCG tablet, Take 50 mcg by mouth at bedtime., Disp: , Rfl:  .  vitamin C (ASCORBIC ACID) 500 MG tablet, Take 500 mg by mouth at bedtime., Disp: ,  Rfl:   Assessment/ Plan: 53 y.o. female   1. Hypertension associated with diabetes ([place]) Controlled.  I reviewed her last note with infectious disease and her blood pressure was under excellent control.  Continue Norvasc 10 mg daily.  Refill sent - amLODipine (NORVASC) 10 MG tablet; Take 1 tablet (10 mg total) by mouth at bedtime.  Dispense: 90 tablet; Refill: 3  2. Allergic rhinitis, unspecified seasonality, unspecified trigger Stable with use of Flonase and Zyrtec. - cetirizine (ZYRTEC) 10 MG tablet; Take 1 tablet (10 mg total) by mouth daily.  Dispense: 90 tablet; Refill: 3  3. Depression, recurrent ([place]) Stable.  Fluoxetine renewed - FLUoxetine (PROZAC) 20 MG capsule; Take 1 capsule (20 mg total) by mouth daily.  Dispense: 90 capsule; Refill: 3  4. Chronic bilateral low back pain without sciatica Stable with as needed use of Robaxin.  This has been refilled - methocarbamol (ROBAXIN) 500 MG tablet; Take 1 tablet (500 mg total) by mouth every 8 (eight) hours as needed for muscle spasms.  Dispense: 30 tablet; Refill: 1  5. Human immunodeficiency virus (HIV) disease ([place]) Followed by ID.  Most recent checkup was good.  Continue to follow intermittently as scheduled.  Continue antivirals  6. Chronic hepatitis C without hepatic coma ([facility]) Appears to have cleared the virus.  Monitored by ID  7. Gastroesophageal reflux disease without esophagitis Stable with use of Protonix.  Renewals have been sent  Meds ordered this encounter  Medications  . amLODipine (NORVASC) 10 MG tablet    Sig: Take 1 tablet (10 mg total) by mouth at bedtime.    Dispense:  90 tablet    Refill:  3  . cetirizine (ZYRTEC) 10 MG tablet    Sig: Take 1 tablet (10 mg total) by mouth daily.    Dispense:  90 tablet    Refill:  3  . FLUoxetine (PROZAC) 20 MG capsule    Sig: Take 1 capsule (20 mg total) by mouth daily.    Dispense:  90 capsule    Refill:  3  . fluticasone (FLONASE) 50 MCG/ACT nasal spray    Sig:  Place 2 sprays into both nostrils daily.    Dispense:  48 g    Refill:  3  . methocarbamol (ROBAXIN) 500 MG tablet    Sig: Take 1 tablet (500 mg total) by mouth every 8 (eight) hours as needed for muscle spasms.    Dispense:  30 tablet    Refill:  1  . pantoprazole (PROTONIX) 40 MG tablet    Sig: Take 1 tablet (40 mg total) by mouth at bedtime.    Dispense:  90 tablet    Refill:  3    Start time: 12:44pm End time: 12:49pm  Total time spent on patient care (including telephone call/ virtual visit): 10 minutes  Janora Norlander, DO [facility] [phone]

## 2019-02-11 ENCOUNTER — Telehealth: Payer: Self-pay | Admitting: Family Medicine

## 2019-02-11 ENCOUNTER — Encounter: Payer: Self-pay | Admitting: Family Medicine

## 2019-02-11 ENCOUNTER — Ambulatory Visit (INDEPENDENT_AMBULATORY_CARE_PROVIDER_SITE_OTHER): Payer: Medicaid Other | Admitting: Family Medicine

## 2019-02-11 DIAGNOSIS — J019 Acute sinusitis, unspecified: Secondary | ICD-10-CM | POA: Diagnosis not present

## 2019-02-11 MED ORDER — AMOXICILLIN-POT CLAVULANATE 875-125 MG PO TABS: 1.0000 | ORAL_TABLET | Freq: Two times a day (BID) | ORAL | 0 refills | Status: AC

## 2019-02-11 NOTE — Progress Notes (Signed)
Virtual Visit via Telephone Note  I connected with Ashley [name] on 02/11/19 at 1:26 PM by telephone and verified that I am speaking with the correct person using two identifiers. Ashley [name] is currently located at home and nobody is currently with her during this visit. The provider, Loman Brooklyn, FNP is located in their office at time of visit.  I discussed the limitations, risks, security and privacy concerns of performing an evaluation and management service by telephone and the availability of in person appointments. I also discussed with the patient that there may be a patient responsible charge related to this service. The patient expressed understanding and agreed to proceed.  Subjective: PCP: Janora Norlander, DO  Chief Complaint  Patient presents with  . Otalgia   Patient complains of cough, head congestion, runny nose, sneezing, right ear pain/pressure, postnasal drainage and loss of taste.  Patient reports that her ear pops when she blows her nose and otherwise it sounds like she is in a hole.  Onset of symptoms was 1 week ago, gradually worsening since that time. She is drinking plenty of fluids. Evaluation to date: none. Treatment to date: antihistamines and nasal steroids.  She has tried putting hot oil in her ear followed by peroxide. She has a history of asthma. She does smoke.    ROS: Per HPI  Current Outpatient Medications:  .  albuterol (VENTOLIN HFA) 108 (90 Base) MCG/ACT inhaler, INHALE 2 PUFFS INTO THE LUNGS EVERY 6 HOURS AS NEEDED FOR WHEEZING ORSHORTNESS OF BREATH *NEEDS AN APPOINTMENT*, Disp: 8.5 g, Rfl: 0 .  amLODipine (NORVASC) 10 MG tablet, Take 1 tablet (10 mg total) by mouth at bedtime., Disp: 90 tablet, Rfl: 3 .  amoxicillin-clavulanate (AUGMENTIN) 875-125 MG tablet, Take 1 tablet by mouth 2 (two) times daily for 7 days., Disp: 14 tablet, Rfl: 0 .  bictegravir-emtricitabine-tenofovir AF (BIKTARVY) 50-200-25 MG TABS tablet, Take 1 tablet by mouth  daily., Disp: 30 tablet, Rfl: 6 .  cetirizine (ZYRTEC) 10 MG tablet, Take 1 tablet (10 mg total) by mouth daily., Disp: 90 tablet, Rfl: 3 .  docusate sodium (COLACE) 100 MG capsule, Take 1 capsule (100 mg total) by mouth 2 (two) times daily., Disp: 60 capsule, Rfl: 2 .  FLUoxetine (PROZAC) 20 MG capsule, Take 1 capsule (20 mg total) by mouth daily., Disp: 90 capsule, Rfl: 3 .  fluticasone (FLONASE) 50 MCG/ACT nasal spray, Place 2 sprays into both nostrils daily., Disp: 48 g, Rfl: 3 .  megestrol (MEGACE ES) 625 MG/5ML suspension, Take 5 mLs (625 mg total) by mouth daily., Disp: 150 mL, Rfl: 6 .  methocarbamol (ROBAXIN) 500 MG tablet, Take 1 tablet (500 mg total) by mouth every 8 (eight) hours as needed for muscle spasms., Disp: 30 tablet, Rfl: 1 .  Multiple Vitamin (MULTI-VITAMINS) TABS, Take 1 tablet by mouth daily., Disp: 90 tablet, Rfl: 1 .  Omega-3 Fatty Acids (FISH OIL) 1000 MG CAPS, Take 1,000 mg by mouth at bedtime., Disp: , Rfl:  .  pantoprazole (PROTONIX) 40 MG tablet, Take 1 tablet (40 mg total) by mouth at bedtime., Disp: 90 tablet, Rfl: 3 .  Psyllium 48.57 % POWD, Take 4 g by mouth at bedtime. (Patient taking differently: Take 1 packet by mouth daily as needed (for constipation). ), Disp: , Rfl: 0 .  senna (SENOKOT) 8.6 MG TABS tablet, Take 1 tablet (8.6 mg total) by mouth at bedtime as needed (constipation). (Patient taking differently: Take 1 tablet by mouth at bedtime  as needed for moderate constipation. ), Disp: 90 tablet, Rfl: 2 .  vitamin B-12 (CYANOCOBALAMIN) 50 MCG tablet, Take 50 mcg by mouth at bedtime., Disp: , Rfl:  .  vitamin C (ASCORBIC ACID) 500 MG tablet, Take 500 mg by mouth at bedtime., Disp: , Rfl:   Allergies  Allergen Reactions  . Aspirin Other (See Comments)    Liver problems  . Roxicodone [Oxycodone Hcl] Itching   Past Medical History:  Diagnosis Date  . Ankle fracture   . Anxiety   . Asthma   . Collagen vascular disease ([place])   . COPD (chronic obstructive  pulmonary disease) ([place])   . Depression   . Diabetes mellitus   . Elevated LFTs 01/13/2012  . Hepatitis C   . Hernia   . HIV (human immunodeficiency virus infection) ([place])   . HIV (human immunodeficiency virus infection) ([place])   . Hypertension   . Hypothyroidism   . Pinched nerve   . Scoliosis   . TB (tuberculosis)     Observations/Objective: A&O  No respiratory distress or wheezing audible over the phone Mood, judgement, and thought processes all WNL  Assessment and Plan: 1. Acute non-recurrent sinusitis, unspecified location - Education provided on sinusitis. Symptom management. Encouraged patient to get tested for COVID-19; she will schedule when her son gets home from work.  - amoxicillin-clavulanate (AUGMENTIN) 875-125 MG tablet; Take 1 tablet by mouth 2 (two) times daily for 7 days.  Dispense: 14 tablet; Refill: 0   Follow Up Instructions:  I discussed the assessment and treatment plan with the patient. The patient was provided an opportunity to ask questions and all were answered. The patient agreed with the plan and demonstrated an understanding of the instructions.   The patient was advised to call back or seek an in-person evaluation if the symptoms worsen or if the condition fails to improve as anticipated.  The above assessment and management plan was discussed with the patient. The patient verbalized understanding of and has agreed to the management plan. Patient is aware to call the clinic if symptoms persist or worsen. Patient is aware when to return to the clinic for a follow-up visit. Patient educated on when it is appropriate to go to the emergency department.   Time call ended: 1:36 PM  I provided 15 minutes of non-face-to-face time during this encounter.  Hendricks Limes, MSN, APRN, FNP-C [facility] 02/11/19

## 2019-02-11 NOTE — Patient Instructions (Signed)

## 2019-02-11 NOTE — Telephone Encounter (Signed)
What symptoms do you have? earache  How long have you been sick? 3 days  Have you been seen for this problem? just had televisit 02/07/2019  If your provider decides to give you a prescription, which pharmacy would you like for it to be sent to? [facility] [place]   Patient informed that this information will be sent to the clinical staff for review and that they should receive a follow up call.

## 2019-02-11 NOTE — Telephone Encounter (Signed)
Her 02/07/2019 appointment was for chronic care follow-up.  We did not discuss any new onset illness.  Please have her schedule a telephone visit with another provider for acute illness.

## 2019-02-11 NOTE — Telephone Encounter (Signed)
Appt made

## 2019-02-24 ENCOUNTER — Other Ambulatory Visit: Payer: Self-pay | Admitting: Family Medicine

## 2019-02-24 DIAGNOSIS — J439 Emphysema, unspecified: Secondary | ICD-10-CM

## 2019-03-15 ENCOUNTER — Other Ambulatory Visit: Payer: Self-pay | Admitting: Family Medicine

## 2019-03-18 ENCOUNTER — Other Ambulatory Visit: Payer: Self-pay | Admitting: Family

## 2019-03-18 ENCOUNTER — Other Ambulatory Visit: Payer: Self-pay | Admitting: Family Medicine

## 2019-03-18 DIAGNOSIS — J439 Emphysema, unspecified: Secondary | ICD-10-CM

## 2019-03-31 ENCOUNTER — Other Ambulatory Visit: Payer: Medicaid Other

## 2019-04-02 ENCOUNTER — Other Ambulatory Visit: Payer: Self-pay | Admitting: Family Medicine

## 2019-04-02 DIAGNOSIS — K5900 Constipation, unspecified: Secondary | ICD-10-CM

## 2019-04-02 NOTE — Telephone Encounter (Signed)
Miralax is not on pt's med list. It is documented that pt manages well on it in the January 2020 note. Are you alright with sending in refills. Pt does not have a follow up scheduled at this time.

## 2019-04-14 ENCOUNTER — Encounter: Payer: Medicaid Other | Admitting: Internal Medicine

## 2019-04-15 ENCOUNTER — Other Ambulatory Visit: Payer: Self-pay | Admitting: Family Medicine

## 2019-04-15 DIAGNOSIS — G8929 Other chronic pain: Secondary | ICD-10-CM

## 2019-05-01 ENCOUNTER — Ambulatory Visit (INDEPENDENT_AMBULATORY_CARE_PROVIDER_SITE_OTHER): Payer: Medicaid Other | Admitting: Family Medicine

## 2019-05-01 ENCOUNTER — Encounter: Payer: Self-pay | Admitting: Family Medicine

## 2019-05-01 DIAGNOSIS — M25562 Pain in left knee: Secondary | ICD-10-CM

## 2019-05-01 DIAGNOSIS — M25532 Pain in left wrist: Secondary | ICD-10-CM

## 2019-05-01 DIAGNOSIS — M25571 Pain in right ankle and joints of right foot: Secondary | ICD-10-CM

## 2019-05-01 DIAGNOSIS — M25531 Pain in right wrist: Secondary | ICD-10-CM

## 2019-05-01 DIAGNOSIS — G8929 Other chronic pain: Secondary | ICD-10-CM

## 2019-05-01 NOTE — Progress Notes (Signed)
Virtual Visit via telephone Note Due to COVID-19 pandemic this visit was conducted virtually. This visit type was conducted due to national recommendations for restrictions regarding the COVID-19 Pandemic (e.g. social distancing, sheltering in place) in an effort to limit this patient's exposure and mitigate transmission in our community. All issues noted in this document were discussed and addressed.  A physical exam was not performed with this format.   I connected with Ashley [name] on 05/01/2019 at 0850 by telephone and verified that I am speaking with the correct person using two identifiers. Ashley [name] is currently located at home and no one is currently with them during visit. The provider, Monia Pouch, FNP is located in their office at time of visit.  I discussed the limitations, risks, security and privacy concerns of performing an evaluation and management service by telephone and the availability of in person appointments. I also discussed with the patient that there may be a patient responsible charge related to this service. The patient expressed understanding and agreed to proceed.  Subjective:  Patient ID: Ashley [name], female    DOB: April 09, 1965, 54 y.o.   MRN: [id number]  Chief Complaint:  Ankle Pain   HPI: Ashley [name] is a 54 y.o. female presenting on 05/01/2019 for Ankle Pain   Pt with chronic pain of bilateral wrists, left knee, and right ankle. Pt requesting ae bandages for knee and ankle and braces for her wrists. States she needs a prescription so she can pick these up. She denies need for medications for the symptoms. No new injuries.   Ankle Pain  Incident onset: several years ago. The pain is present in the right ankle. The pain is at a severity of 4/10. The pain is mild. The pain has been fluctuating since onset. Associated symptoms include an inability to bear weight (at times) and tingling. Pertinent negatives include no loss of motion, loss of sensation,  muscle weakness or numbness. The symptoms are aggravated by movement and weight bearing. She has tried acetaminophen and elevation for the symptoms. The treatment provided mild relief.  Knee Pain  Incident onset: more than a year ago. The pain is present in the left knee. The pain is at a severity of 4/10. The pain is mild. The pain has been fluctuating since onset. Associated symptoms include an inability to bear weight (at times) and tingling. Pertinent negatives include no loss of motion, loss of sensation, muscle weakness or numbness. The symptoms are aggravated by movement, weight bearing and palpation. She has tried elevation and acetaminophen for the symptoms. The treatment provided mild relief.  Wrist Pain  The pain is present in the left wrist and right wrist. This is a chronic problem. The current episode started more than 1 year ago. There has been no history of extremity trauma. The problem occurs daily. The problem has been waxing and waning. The quality of the pain is described as aching, sharp and burning. The pain is at a severity of 4/10. The pain is mild. Associated symptoms include an inability to bear weight (at times), a limited range of motion and tingling. Pertinent negatives include no fever, itching, joint locking, joint swelling, numbness or stiffness. The symptoms are aggravated by activity. She has tried acetaminophen and heat for the symptoms. The treatment provided mild relief.     Relevant past medical, surgical, family, and social history reviewed and updated as indicated.  Allergies and medications reviewed and updated.   Past Medical History:  Diagnosis  Date  . Ankle fracture   . Anxiety   . Asthma   . Collagen vascular disease ([place])   . COPD (chronic obstructive pulmonary disease) ([place])   . Depression   . Diabetes mellitus   . Elevated LFTs 01/13/2012  . Hepatitis C   . Hernia   . HIV (human immunodeficiency virus infection) ([place])   . HIV (human  immunodeficiency virus infection) ([place])   . Hypertension   . Hypothyroidism   . Pinched nerve   . Scoliosis   . TB (tuberculosis)     Past Surgical History:  Procedure Laterality Date  . BIOPSY  05/08/2018   Procedure: biopsy;  Surgeon: Rogene Houston, MD;  Location: [facility];  Service: Endoscopy;;  proximal transverse colon polyp  . CESAREAN SECTION    . CHOLECYSTECTOMY    . COLONOSCOPY N/A 05/08/2018   Procedure: COLONOSCOPY;  Surgeon: Rogene Houston, MD;  Location: [facility];  Service: Endoscopy;  Laterality: N/A;  2:00  . HERNIA REPAIR    . INCISIONAL HERNIA REPAIR N/A 09/30/2018   Procedure: OPEN HERNIA REPAIR INCISIONAL WITH MESH;  Surgeon: Virl Cagey, MD;  Location: [facility] ORS;  Service: General;  Laterality: N/A;  . LIVER BIOPSY      Social History   Socioeconomic History  . Marital status: Single    Spouse name: Not on file  . Number of children: 2  . Years of education: Not on file  . Highest education level: Not on file  Occupational History  . Not on file  Tobacco Use  . Smoking status: Current Every Day Smoker    Packs/day: 1.00    Years: 35.00    Pack years: 35.00    Types: Cigarettes  . Smokeless tobacco: Never Used  . Tobacco comment: 1 pack every 2-3 days  Substance and Sexual Activity  . 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 Sexual activity: Yes    Birth control/protection: Condom, None    Comment: condoms offered, condoms and lube provided  Other Topics Concern  . Not on file  Social History Narrative  . Not on file   Social Determinants of Health   Financial Resource Strain:   . Difficulty of Paying Living Expenses: Not on file  Food Insecurity:   . Worried About Charity fundraiser in the Last Year: Not on file  . Ran Out of  Food in the Last Year: Not on file  Transportation Needs:   . Lack of Transportation (Medical): Not on file  . Lack of Transportation (Non-Medical): Not on file  Physical Activity:   . Days of Exercise per Week: Not on file  . Minutes of Exercise per Session: Not on file  Stress:   . Feeling of Stress : Not on file  Social Connections:   . Frequency of Communication with Friends and Family: Not on file  . Frequency of Social Gatherings with Friends and Family: Not on file  . Attends Religious Services: Not on file  . Active Member of Clubs or Organizations: Not on file  . Attends Archivist Meetings: Not on file  . Marital Status: Not on file  Intimate Partner Violence:   . Fear of Current  or Ex-Partner: Not on file  . Emotionally Abused: Not on file  . Physically Abused: Not on file  . Sexually Abused: Not on file    Outpatient Encounter Medications as of 05/01/2019  Medication Sig  . albuterol (VENTOLIN HFA) 108 (90 Base) MCG/ACT inhaler INHALE 2 PUFFS INTO THE LUNGS EVERY 6 HOURS AS NEEDED FOR WHEEZING ORSHORTNESS OF BREATH *NEEDS AN APPOINTMENT*  . amLODipine (NORVASC) 10 MG tablet Take 1 tablet (10 mg total) by mouth at bedtime.  . bictegravir-emtricitabine-tenofovir AF (BIKTARVY) 50-200-25 MG TABS tablet Take 1 tablet by mouth daily.  . cetirizine (ZYRTEC) 10 MG tablet Take 1 tablet (10 mg total) by mouth daily.  Marland Kitchen docusate sodium (COLACE) 100 MG capsule Take 1 capsule (100 mg total) by mouth 2 (two) times daily.  Marland Kitchen FLUoxetine (PROZAC) 20 MG capsule Take 1 capsule (20 mg total) by mouth daily.  . fluticasone (FLONASE) 50 MCG/ACT nasal spray Place 2 sprays into both nostrils daily.  . megestrol (MEGACE ES) 625 MG/5ML suspension Take 5 mLs (625 mg total) by mouth daily.  . methocarbamol (ROBAXIN) 500 MG tablet TAKE ONE TABLET BY MOUTH EVERY 8 HOURS AS NEEDED FOR MUSCLE SPASMS  . Multiple Vitamin (MULTI-VITAMINS) TABS Take 1 tablet by mouth daily.  . Multiple  Vitamins-Minerals (TAB-A-VITE) TABS TAKE ONE TABLET BY MOUTH DAILY  . Omega-3 Fatty Acids (FISH OIL) 1000 MG CAPS Take 1,000 mg by mouth at bedtime.  . pantoprazole (PROTONIX) 40 MG tablet Take 1 tablet (40 mg total) by mouth at bedtime.  . Polyethylene Glycol 3350 (PEG 3350) 17 g PACK Mix 1 packet in 8 ounces of fluid and drink daily IF needed for constipation.  . Psyllium 48.57 % POWD Take 4 g by mouth at bedtime. (Patient taking differently: Take 1 packet by mouth daily as needed (for constipation). )  . senna (SENOKOT) 8.6 MG TABS tablet TAKE ONE TABLET BY MOUTH AT BEDTIME AS NEEDED FOR CONSTIPATION  . vitamin B-12 (CYANOCOBALAMIN) 50 MCG tablet Take 50 mcg by mouth at bedtime.  . vitamin C (ASCORBIC ACID) 500 MG tablet Take 500 mg by mouth at bedtime.   No facility-administered encounter medications on file as of 05/01/2019.    Allergies  Allergen Reactions  . Aspirin Other (See Comments)    Liver problems  . Roxicodone [Oxycodone Hcl] Itching    Review of Systems  Constitutional: Negative for activity change, appetite change, chills, diaphoresis, fatigue, fever and unexpected weight change.  HENT: Negative.   Eyes: Negative.   Respiratory: Negative for cough, chest tightness and shortness of breath.   Cardiovascular: Negative for chest pain, palpitations and leg swelling.  Gastrointestinal: Negative for blood in stool, constipation, diarrhea, nausea and vomiting.  Endocrine: Negative.   Genitourinary: Negative for dysuria, frequency and urgency.  Musculoskeletal: Positive for arthralgias, gait problem, joint swelling and myalgias. Negative for neck pain, neck stiffness and stiffness.  Skin: Negative.  Negative for itching.  Allergic/Immunologic: Negative.   Neurological: Positive for tingling. Negative for dizziness, tremors, seizures, syncope, facial asymmetry, speech difficulty, weakness, light-headedness, numbness and headaches.  Hematological: Negative.     Psychiatric/Behavioral: Negative for confusion, hallucinations, sleep disturbance and suicidal ideas.  All other systems reviewed and are negative.        Observations/Objective: No vital signs or physical exam, this was a telephone or virtual health encounter.  Pt alert and oriented, answers all questions appropriately, and able to speak in full sentences.    Assessment and Plan: Nandi was seen today for ankle  pain.  Diagnoses and all orders for this visit:  Bilateral wrist pain Chronic bilateral wrist pain with reported tingling in fingers at times. Likely carpal tunnel syndrome. Will write for bilateral wrist braces. Symptomatic care discussed in detail. Pt aware to report any new or worsening symptoms.  -     For home use only DME Other see comment  Chronic pain of left knee No new or worsening symptoms. Would like Ace bandage.  -     For home use only DME Other see comment  Chronic pain of right ankle No new or worsening symptoms. Would like Ace bandage.  -     For home use only DME Other see comment     Follow Up Instructions: Return if symptoms worsen or fail to improve.    I discussed the assessment and treatment plan with the patient. The patient was provided an opportunity to ask questions and all were answered. The patient agreed with the plan and demonstrated an understanding of the instructions.   The patient was advised to call back or seek an in-person evaluation if the symptoms worsen or if the condition fails to improve as anticipated.  The above assessment and management plan was discussed with the patient. The patient verbalized understanding of and has agreed to the management plan. Patient is aware to call the clinic if they develop any new symptoms or if symptoms persist or worsen. Patient is aware when to return to the clinic for a follow-up visit. Patient educated on when it is appropriate to go to the emergency department.    I provided 15 minutes  of non-face-to-face time during this encounter. The call started at 0850. The call ended at 0905. The other time was used for coordination of care.    Monia Pouch, FNP-C [facility] [address] [place], [place] [address] [phone] 05/01/2019

## 2019-05-14 ENCOUNTER — Other Ambulatory Visit: Payer: Self-pay | Admitting: Family Medicine

## 2019-05-14 DIAGNOSIS — J439 Emphysema, unspecified: Secondary | ICD-10-CM

## 2019-05-19 ENCOUNTER — Other Ambulatory Visit: Payer: Self-pay | Admitting: Family Medicine

## 2019-05-19 DIAGNOSIS — J439 Emphysema, unspecified: Secondary | ICD-10-CM

## 2019-05-28 ENCOUNTER — Other Ambulatory Visit: Payer: Self-pay | Admitting: Family Medicine

## 2019-05-28 DIAGNOSIS — G8929 Other chronic pain: Secondary | ICD-10-CM

## 2019-05-28 NOTE — Telephone Encounter (Signed)
Methocarbamol: Last office visit 05/01/2019 Last refill 04/16/2019, #30, no refills

## 2019-05-28 NOTE — Telephone Encounter (Signed)
Ok to refill x1.  If ongoing issue, recommend evaluation by ortho.

## 2019-06-03 ENCOUNTER — Ambulatory Visit (INDEPENDENT_AMBULATORY_CARE_PROVIDER_SITE_OTHER): Payer: Medicaid Other | Admitting: Family Medicine

## 2019-06-03 DIAGNOSIS — M25562 Pain in left knee: Secondary | ICD-10-CM | POA: Diagnosis not present

## 2019-06-03 DIAGNOSIS — R4586 Emotional lability: Secondary | ICD-10-CM

## 2019-06-03 DIAGNOSIS — R21 Rash and other nonspecific skin eruption: Secondary | ICD-10-CM

## 2019-06-03 DIAGNOSIS — G8929 Other chronic pain: Secondary | ICD-10-CM

## 2019-06-03 MED ORDER — PREDNISONE 10 MG (21) PO TBPK: ORAL_TABLET | ORAL | 0 refills | Status: DC

## 2019-06-03 MED ORDER — DICLOFENAC SODIUM 1 % EX GEL: 2.0000 g | Freq: Four times a day (QID) | CUTANEOUS | 2 refills | Status: DC

## 2019-06-03 NOTE — Progress Notes (Signed)
Telephone visit  Subjective: CC: difficulty sleeping/ knee pain PCP: Janora Norlander, DO [id number] Ashley [name] is a 54 y.o. female calls for telephone consult today. Patient provides verbal consent for consult held via phone.  Due to COVID-19 pandemic this visit was conducted virtually. This visit type was conducted due to national recommendations for restrictions regarding the COVID-19 Pandemic (e.g. social distancing, sheltering in place) in an effort to limit this patient's exposure and mitigate transmission in our community. All issues noted in this document were discussed and addressed.  A physical exam was not performed with this format.   Location of patient: home Location of provider: [facility] Others present for call: none  1. Difficulty sleeping/ mood lability Patient reports that over the last few days she has been having mood lability. She notes difficulty with a girlfriend of her son's.  She has had some difficulty sleeping as a result.  Compliant with Prozac.  2. Knee pain/ swelling Patient reports pain in her left knee.  She has history of injury several years ago. She notes symptoms are refractory to OTC analgesics.  She wants a knee brace.  3. Rash She at tomatoes and broke out in a rash.  She had some facial swelling that has resolved.  No SOB, wheezing, throat swelling.  ROS: Per HPI  Allergies  Allergen Reactions  . Aspirin Other (See Comments)    Liver problems  . Roxicodone [Oxycodone Hcl] Itching   Past Medical History:  Diagnosis Date  . Ankle fracture   . Anxiety   . Asthma   . Collagen vascular disease ([place])   . COPD (chronic obstructive pulmonary disease) ([place])   . Depression   . Diabetes mellitus   . Elevated LFTs 01/13/2012  . Hepatitis C   . Hernia   . HIV (human immunodeficiency virus infection) ([place])   . HIV (human immunodeficiency virus infection) ([place])   . Hypertension   . Hypothyroidism   . Pinched nerve   . Scoliosis   . TB  (tuberculosis)     Current Outpatient Medications:  .  albuterol (VENTOLIN HFA) 108 (90 Base) MCG/ACT inhaler, Inhale 2 puffs into the lungs every 6 (six) hours as needed for wheezing or shortness of breath., Disp: 18 g, Rfl: 1 .  amLODipine (NORVASC) 10 MG tablet, Take 1 tablet (10 mg total) by mouth at bedtime., Disp: 90 tablet, Rfl: 3 .  bictegravir-emtricitabine-tenofovir AF (BIKTARVY) 50-200-25 MG TABS tablet, Take 1 tablet by mouth daily., Disp: 30 tablet, Rfl: 6 .  cetirizine (ZYRTEC) 10 MG tablet, Take 1 tablet (10 mg total) by mouth daily., Disp: 90 tablet, Rfl: 3 .  docusate sodium (COLACE) 100 MG capsule, Take 1 capsule (100 mg total) by mouth 2 (two) times daily., Disp: 60 capsule, Rfl: 2 .  FLUoxetine (PROZAC) 20 MG capsule, Take 1 capsule (20 mg total) by mouth daily., Disp: 90 capsule, Rfl: 3 .  fluticasone (FLONASE) 50 MCG/ACT nasal spray, Place 2 sprays into both nostrils daily., Disp: 48 g, Rfl: 3 .  megestrol (MEGACE ES) 625 MG/5ML suspension, Take 5 mLs (625 mg total) by mouth daily., Disp: 150 mL, Rfl: 6 .  methocarbamol (ROBAXIN) 500 MG tablet, TAKE ONE TABLET BY MOUTH EVERY 8 HOURS AS NEEDED FOR MUSCLE SPASMS, Disp: 30 tablet, Rfl: 0 .  Multiple Vitamin (MULTI-VITAMINS) TABS, Take 1 tablet by mouth daily., Disp: 90 tablet, Rfl: 1 .  Multiple Vitamins-Minerals (TAB-A-VITE) TABS, TAKE ONE TABLET BY MOUTH DAILY, Disp: 90 tablet, Rfl: 0 .  Omega-3 Fatty Acids (FISH OIL) 1000 MG CAPS, Take 1,000 mg by mouth at bedtime., Disp: , Rfl:  .  pantoprazole (PROTONIX) 40 MG tablet, Take 1 tablet (40 mg total) by mouth at bedtime., Disp: 90 tablet, Rfl: 3 .  Polyethylene Glycol 3350 (PEG 3350) 17 g PACK, Mix 1 packet in 8 ounces of fluid and drink daily IF needed for constipation., Disp: 30 each, Rfl: prn .  Psyllium 48.57 % POWD, Take 4 g by mouth at bedtime. (Patient taking differently: Take 1 packet by mouth daily as needed (for constipation). ), Disp: , Rfl: 0 .  senna (SENOKOT) 8.6  MG TABS tablet, TAKE ONE TABLET BY MOUTH AT BEDTIME AS NEEDED FOR CONSTIPATION, Disp: 30 tablet, Rfl: 0 .  vitamin B-12 (CYANOCOBALAMIN) 50 MCG tablet, Take 50 mcg by mouth at bedtime., Disp: , Rfl:  .  vitamin C (ASCORBIC ACID) 500 MG tablet, Take 500 mg by mouth at bedtime., Disp: , Rfl:   Assessment/ Plan: 54 y.o. female   1. Chronic pain of left knee Recommend ortho evaluation if ongoing.  Brace, ice.  Prednisone - predniSONE (STERAPRED UNI-PAK 21 TAB) 10 MG (21) TBPK tablet; As directed x 6 days  Dispense: 21 tablet; Refill: 0  2. Rash Prednisone rx'd.  Avoid triggers - predniSONE (STERAPRED UNI-PAK 21 TAB) 10 MG (21) TBPK tablet; As directed x 6 days  Dispense: 21 tablet; Refill: 0  3. Labile mood May need evaluation by psych if not doing well.    Meds ordered this encounter  Medications  . predniSONE (STERAPRED UNI-PAK 21 TAB) 10 MG (21) TBPK tablet    Sig: As directed x 6 days    Dispense:  21 tablet    Refill:  0  . diclofenac Sodium (VOLTAREN) 1 % GEL    Sig: Apply 2 g topically 4 (four) times daily.    Dispense:  200 g    Refill:  2     Start time: 5:23pm End time: 5:35pm  Total time spent on patient care (including telephone call/ virtual visit): 20 minutes  [place], [place] [phone]

## 2019-06-10 ENCOUNTER — Other Ambulatory Visit: Payer: Self-pay | Admitting: Internal Medicine

## 2019-06-10 DIAGNOSIS — B20 Human immunodeficiency virus [HIV] disease: Secondary | ICD-10-CM

## 2019-06-18 ENCOUNTER — Emergency Department (HOSPITAL_COMMUNITY): Payer: Medicaid Other

## 2019-06-18 ENCOUNTER — Encounter (HOSPITAL_COMMUNITY): Payer: Self-pay | Admitting: Emergency Medicine

## 2019-06-18 ENCOUNTER — Emergency Department (HOSPITAL_COMMUNITY)
Admission: EM | Admit: 2019-06-18 | Discharge: 2019-06-19 | Disposition: A | Discharge status: Other Acute Inpatient Hospital | Payer: Medicaid Other | Attending: Emergency Medicine | Admitting: Emergency Medicine

## 2019-06-18 ENCOUNTER — Other Ambulatory Visit: Payer: Self-pay

## 2019-06-18 DIAGNOSIS — Z7984 Long term (current) use of oral hypoglycemic drugs: Secondary | ICD-10-CM | POA: Insufficient documentation

## 2019-06-18 DIAGNOSIS — M545 Low back pain, unspecified: Secondary | ICD-10-CM

## 2019-06-18 DIAGNOSIS — R1084 Generalized abdominal pain: Secondary | ICD-10-CM | POA: Insufficient documentation

## 2019-06-18 DIAGNOSIS — Z20822 Contact with and (suspected) exposure to covid-19: Secondary | ICD-10-CM | POA: Diagnosis not present

## 2019-06-18 DIAGNOSIS — I10 Essential (primary) hypertension: Secondary | ICD-10-CM | POA: Insufficient documentation

## 2019-06-18 DIAGNOSIS — E876 Hypokalemia: Secondary | ICD-10-CM | POA: Diagnosis not present

## 2019-06-18 DIAGNOSIS — F17210 Nicotine dependence, cigarettes, uncomplicated: Secondary | ICD-10-CM | POA: Diagnosis not present

## 2019-06-18 DIAGNOSIS — Z21 Asymptomatic human immunodeficiency virus [HIV] infection status: Secondary | ICD-10-CM | POA: Insufficient documentation

## 2019-06-18 DIAGNOSIS — F22 Delusional disorders: Secondary | ICD-10-CM

## 2019-06-18 DIAGNOSIS — J449 Chronic obstructive pulmonary disease, unspecified: Secondary | ICD-10-CM | POA: Diagnosis not present

## 2019-06-18 DIAGNOSIS — F333 Major depressive disorder, recurrent, severe with psychotic symptoms: Secondary | ICD-10-CM | POA: Diagnosis not present

## 2019-06-18 DIAGNOSIS — R4182 Altered mental status, unspecified: Secondary | ICD-10-CM | POA: Diagnosis present

## 2019-06-18 DIAGNOSIS — E119 Type 2 diabetes mellitus without complications: Secondary | ICD-10-CM | POA: Diagnosis not present

## 2019-06-18 DIAGNOSIS — Z79899 Other long term (current) drug therapy: Secondary | ICD-10-CM | POA: Diagnosis not present

## 2019-06-18 LAB — CBC WITH DIFFERENTIAL/PLATELET
Abs Immature Granulocytes: 0.01 10*3/uL (ref 0.00–0.07)
Basophils Absolute: 0 10*3/uL (ref 0.0–0.1)
Basophils Relative: 1 %
Eosinophils Absolute: 0.2 10*3/uL (ref 0.0–0.5)
Eosinophils Relative: 4 %
HCT: 37.8 % (ref 36.0–46.0)
Hemoglobin: 13 g/dL (ref 12.0–15.0)
Immature Granulocytes: 0 %
Lymphocytes Relative: 36 %
Lymphs Abs: 2 10*3/uL (ref 0.7–4.0)
MCH: 34.2 pg — ABNORMAL HIGH (ref 26.0–34.0)
MCHC: 34.4 g/dL (ref 30.0–36.0)
MCV: 99.5 fL (ref 80.0–100.0)
Monocytes Absolute: 0.5 10*3/uL (ref 0.1–1.0)
Monocytes Relative: 9 %
Neutro Abs: 2.8 10*3/uL (ref 1.7–7.7)
Neutrophils Relative %: 50 %
Platelets: 162 10*3/uL (ref 150–400)
RBC: 3.8 MIL/uL — ABNORMAL LOW (ref 3.87–5.11)
RDW: 13.7 % (ref 11.5–15.5)
WBC: 5.5 10*3/uL (ref 4.0–10.5)
nRBC: 0 % (ref 0.0–0.2)

## 2019-06-18 LAB — COMPREHENSIVE METABOLIC PANEL
ALT: 70 U/L — ABNORMAL HIGH (ref 0–44)
AST: 73 U/L — ABNORMAL HIGH (ref 15–41)
Albumin: 4.7 g/dL (ref 3.5–5.0)
Alkaline Phosphatase: 70 U/L (ref 38–126)
Anion gap: 14 (ref 5–15)
BUN: 10 mg/dL (ref 6–20)
CO2: 25 mmol/L (ref 22–32)
Calcium: 9.4 mg/dL (ref 8.9–10.3)
Chloride: 102 mmol/L (ref 98–111)
Creatinine, Ser: 0.77 mg/dL (ref 0.44–1.00)
GFR calc Af Amer: >60 mL/min (ref >60)
GFR calc non Af Amer: >60 mL/min (ref >60)
Glucose, Bld: 125 mg/dL — ABNORMAL HIGH (ref 70–99)
Potassium: 3.2 mmol/L — ABNORMAL LOW (ref 3.5–5.1)
Sodium: 141 mmol/L (ref 135–145)
Total Bilirubin: 0.9 mg/dL (ref 0.3–1.2)
Total Protein: 7.7 g/dL (ref 6.5–8.1)

## 2019-06-18 LAB — URINALYSIS, ROUTINE W REFLEX MICROSCOPIC
Bilirubin Urine: NEGATIVE
Glucose, UA: NEGATIVE mg/dL
Hgb urine dipstick: NEGATIVE
Ketones, ur: 5 mg/dL — AB
Leukocytes,Ua: NEGATIVE
Nitrite: NEGATIVE
Protein, ur: NEGATIVE mg/dL
Specific Gravity, Urine: 1.017 (ref 1.005–1.030)
pH: 7 (ref 5.0–8.0)

## 2019-06-18 LAB — LIPASE, BLOOD: Lipase: 27 U/L (ref 11–51)

## 2019-06-18 LAB — RAPID URINE DRUG SCREEN, HOSP PERFORMED
Amphetamines: NOT DETECTED
Barbiturates: NOT DETECTED
Benzodiazepines: NOT DETECTED
Cocaine: NOT DETECTED
Opiates: NOT DETECTED
Tetrahydrocannabinol: POSITIVE — AB

## 2019-06-18 LAB — I-STAT BETA HCG BLOOD, ED (MC, WL, AP ONLY): I-stat hCG, quantitative: <5 m[IU]/mL (ref <5)

## 2019-06-18 LAB — ETHANOL: Alcohol, Ethyl (B): <10 mg/dL (ref <10)

## 2019-06-18 LAB — PREGNANCY, URINE: Preg Test, Ur: NEGATIVE

## 2019-06-18 MED ORDER — FLUTICASONE PROPIONATE 50 MCG/ACT NA SUSP
2.0000 | Freq: Every day | NASAL | Status: DC
  Filled 2019-06-18: qty 16

## 2019-06-18 MED ORDER — IOHEXOL 300 MG/ML  SOLN
100.0000 mL | Freq: Once | INTRAMUSCULAR | Status: AC | PRN
  Administered 2019-06-18: 100 mL via INTRAVENOUS

## 2019-06-18 MED ORDER — AMLODIPINE BESYLATE 5 MG PO TABS
10.0000 mg | ORAL_TABLET | Freq: Every day | ORAL | Status: DC
  Administered 2019-06-18: 10 mg via ORAL
  Filled 2019-06-18: qty 2

## 2019-06-18 MED ORDER — ALBUTEROL SULFATE HFA 108 (90 BASE) MCG/ACT IN AERS
2.0000 | INHALATION_SPRAY | Freq: Four times a day (QID) | RESPIRATORY_TRACT | Status: DC | PRN
  Administered 2019-06-18 – 2019-06-19 (×2): 2 via RESPIRATORY_TRACT
  Filled 2019-06-18: qty 6.7

## 2019-06-18 MED ORDER — BICTEGRAVIR-EMTRICITAB-TENOFOV 50-200-25 MG PO TABS
1.0000 | ORAL_TABLET | Freq: Every day | ORAL | Status: DC
  Administered 2019-06-18 – 2019-06-19 (×2): 1 via ORAL
  Filled 2019-06-18 (×3): qty 1

## 2019-06-18 MED ORDER — FLUOXETINE HCL 20 MG PO CAPS
20.0000 mg | ORAL_CAPSULE | Freq: Every day | ORAL | Status: DC
  Administered 2019-06-18 – 2019-06-19 (×2): 20 mg via ORAL
  Filled 2019-06-18 (×2): qty 1

## 2019-06-18 MED ORDER — LORATADINE 10 MG PO TABS
10.0000 mg | ORAL_TABLET | Freq: Every day | ORAL | Status: DC
  Administered 2019-06-19: 10:00:00 10 mg via ORAL
  Filled 2019-06-18: qty 1

## 2019-06-18 MED ORDER — POTASSIUM CHLORIDE CRYS ER 20 MEQ PO TBCR
40.0000 meq | EXTENDED_RELEASE_TABLET | Freq: Three times a day (TID) | ORAL | Status: AC
  Administered 2019-06-18 – 2019-06-19 (×3): 40 meq via ORAL
  Filled 2019-06-18 (×3): qty 2

## 2019-06-18 NOTE — ED Triage Notes (Signed)
Pt presents by EMS with C/O "labor pains." Pt C/O back and lower abdominal pain. Pt having flight of ideas. Pt is reporting "my husband just got out of prison and I got some lab reports back from my doctor today that told me I had cramping."

## 2019-06-18 NOTE — ED Notes (Signed)
TTS in progress 

## 2019-06-18 NOTE — ED Notes (Signed)
Per Cornelia Copa, at [facility] [place] inpatient placement.

## 2019-06-18 NOTE — Progress Notes (Signed)
Patient meets inpatient criteria per Earleen Newport, NP. Patient has been faxed out to the following facilities for review:   [facility]  [facility]  [facility]   CSW will continue to follow and assist with securing bed placement.   Domenic Schwab, MSW, LCSW-A Clinical Disposition Social Worker [facility]/TTS [phone]
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
## 2019-06-18 NOTE — ED Notes (Addendum)
Pt belongings placed in pt belongings bag and pt dressed out in scrubs. Pt has 4 pt belongings bags in the locker room. Security has 2 small clear bags with pt phones (2) and cords in one and jewelry in the other. Pt was wanded by security. Pt was upset about her belongings being taken. Pt is now clam.

## 2019-06-18 NOTE — ED Notes (Signed)
Consult to TTS order placed 730, 930 this am. Pt has not been evaluated. Attempted to contact assessment department x2. No answer. [facility] notified and reported would follow-up with staff.

## 2019-06-18 NOTE — ED Notes (Signed)
Placed AVasyst at bedside. Contacted Tele-monitoring. Tele-monitor to call me back.

## 2019-06-18 NOTE — ED Provider Notes (Signed)
[facility] EMERGENCY DEPARTMENT Provider Note   CSN: [id number] Arrival date & time: 06/18/19  0457   Time seen 5:25 AM  History Chief Complaint  Patient presents with  . Mental Health Problem    Ashley [name] is a 54 y.o. female.  HPI   Patient is hard to keep on one topic.  She seems to be mixing up her times between recently and years ago.  She states she fell 2 months ago when she tripped over some boxes and she hit her forehead and landed on her left hip and hurt her back.  She states she forgot to tell her doctor about it but then she states she discussed it with her doctor.  She states she now wears bilateral wrist and ankle splints.  She also starts tell me some about her husband was cheating on her and that was 1 or 2 years ago when she got trichomonas.  She states the past 3 days she has fallen every day because she tripped over boxes.  She states "I am clumsy".  She states she has scoliosis.  She states she had hernia surgery done in September or August by Dr. Constance Haw however I do not see anything like that in the chart.  She also seems to think she is pregnant.  She states her last period was when she had her son 28 years ago.  She states her tubes are tied.  She has been having nausea and vomiting off and on that she takes Protonix for.  She states she is depressed but she denies suicidal or homicidal ideation.  She states she knows people are talking about her and that this girl has stolen her identity and is going around telling everybody that the patient is crazy.  However it seems she is here because she is having abdominal pain and back pain.  PCP Janora Norlander, DO   Past Medical History:  Diagnosis Date  . Ankle fracture   . Anxiety   . Asthma   . Collagen vascular disease ([place])   . COPD (chronic obstructive pulmonary disease) ([place])   . Depression   . Diabetes mellitus   . Elevated LFTs 01/13/2012  . Hepatitis C   . Hernia   . HIV (human immunodeficiency  virus infection) ([place])   . HIV (human immunodeficiency virus infection) ([place])   . Hypertension   . Hypothyroidism   . Pinched nerve   . Scoliosis   . TB (tuberculosis)     Patient Active Problem List   Diagnosis Date Noted  . Loss of weight 12/09/2018  . Recurrent incisional hernia 09/24/2018  . Rectus diastasis of lower abdomen 09/24/2018  . Rectal bleeding 04/09/2018  . Depressive disorder 10/17/2017  . Umbilical hernia without obstruction and without gangrene 12/11/2016  . Pulmonary emphysema ([place]) 12/17/2015  . Liver fibrosis 11/11/2015  . Encounter for long-term (current) use of medications 06/24/2015  . Substance abuse ([place]) 06/24/2015  . Atypical squamous cells of undetermined significance (ASCUS) on Papanicolaou smear of cervix on 03/26/15 04/02/2015  . MDD (major depressive disorder), recurrent, severe, with psychosis ([address] [address]) 04/30/2014  . Tobacco use 01/13/2012  . Tuberculosis 12/19/2011  . Cigarette smoker 12/19/2011  . Hereditary and idiopathic peripheral neuropathy 06/16/2009  . Hypertension associated with diabetes ([place]) 03/19/2009  . ABDOMINAL WALL HERNIA 03/19/2009  . BACK PAIN, CHRONIC 03/19/2009  . Human immunodeficiency virus (HIV) disease ([place]) 03/18/2009  . Hepatitis C virus infection without hepatic coma 03/18/2009  . Type 2  diabetes mellitus ([place]) 03/18/2009  . Schizophrenia ([place]) 03/18/2009  . BIPOLAR AFFECTIVE DISORDER, DEPRESSED, SEVERE 03/18/2009    Past Surgical History:  Procedure Laterality Date  . BIOPSY  05/08/2018   Procedure: biopsy;  Surgeon: Rogene Houston, MD;  Location: [facility] ENDO SUITE;  Service: Endoscopy;;  proximal transverse colon polyp  . CESAREAN SECTION    . CHOLECYSTECTOMY    . COLONOSCOPY N/A 05/08/2018   Procedure: COLONOSCOPY;  Surgeon: Rogene Houston, MD;  Location: [facility] ENDO SUITE;  Service: Endoscopy;  Laterality: N/A;  2:00  . HERNIA REPAIR    . INCISIONAL HERNIA REPAIR N/A 09/30/2018   Procedure: OPEN HERNIA REPAIR  INCISIONAL WITH MESH;  Surgeon: Virl Cagey, MD;  Location: [facility] ORS;  Service: General;  Laterality: N/A;  . LIVER BIOPSY       OB History    Gravida  2   Para  2   Term      Preterm      AB      Living  2     SAB      TAB      Ectopic      Multiple      Live Births              Family History  Problem Relation Age of Onset  . Diabetes Mother   . Hypertension Mother   . Stroke Mother        died 2013/10/19 . Cancer Sister   . Cancer Maternal Aunt     Social History   Tobacco Use  . Smoking status: Current Every Day Smoker    Packs/day: 1.00    Years: 35.00    Pack years: 35.00    Types: Cigarettes  . Smokeless tobacco: Never Used  . Tobacco comment: 1 pack every 2-3 days  Substance Use Topics  . 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 Drug use: Not Currently    Types: Marijuana, Cocaine    Comment: "been clean for 18 months"    Home Medications Prior to Admission medications   Medication Sig Start Date End Date Taking? Authorizing Provider  albuterol (VENTOLIN HFA) 108 (90 Base) MCG/ACT inhaler Inhale 2 puffs into the lungs every 6 (six) hours as needed for wheezing or shortness of breath. 05/15/19   Janora Norlander, DO  amLODipine (NORVASC) 10 MG tablet Take 1 tablet (10 mg total) by mouth at bedtime. 02/07/19   Ronnie Doss M, DO  BIKTARVY 50-200-25 MG TABS tablet TAKE ONE TABLET BY MOUTH EVERY DAY 06/10/19   Comer, Okey Regal, MD  cetirizine (ZYRTEC) 10 MG tablet Take 1 tablet (10 mg total) by mouth daily. 02/07/19   Janora Norlander, DO  diclofenac Sodium (VOLTAREN) 1 % GEL Apply 2 g topically 4 (four) times daily. 06/03/19   Janora Norlander, DO  docusate sodium (COLACE) 100 MG capsule Take 1 capsule (100 mg total) by mouth 2 (two) times daily. 09/30/18 09/30/19  Virl Cagey, MD  FLUoxetine (PROZAC) 20 MG capsule Take 1  capsule (20 mg total) by mouth daily. 02/07/19   Janora Norlander, DO  fluticasone (FLONASE) 50 MCG/ACT nasal spray Place 2 sprays into both nostrils daily. 02/07/19   Janora Norlander, DO  megestrol (MEGACE ES) 625 MG/5ML suspension Take 5 mLs (625 mg total) by  mouth daily. 12/09/18   Comer, Okey Regal, MD  methocarbamol (ROBAXIN) 500 MG tablet TAKE ONE TABLET BY MOUTH EVERY 8 HOURS AS NEEDED FOR MUSCLE SPASMS 05/28/19   Ronnie Doss M, DO  Multiple Vitamin (MULTI-VITAMINS) TABS Take 1 tablet by mouth daily. 10/02/18   Janora Norlander, DO  Multiple Vitamins-Minerals (TAB-A-VITE) TABS TAKE ONE TABLET BY MOUTH DAILY 05/19/19   Ronnie Doss M, DO  Omega-3 Fatty Acids (FISH OIL) 1000 MG CAPS Take 1,000 mg by mouth at bedtime.    [provider]  pantoprazole (PROTONIX) 40 MG tablet Take 1 tablet (40 mg total) by mouth at bedtime. 02/07/19   Janora Norlander, DO  Polyethylene Glycol 3350 (PEG 3350) 17 g PACK Mix 1 packet in 8 ounces of fluid and drink daily IF needed for constipation. 04/02/19   Janora Norlander, DO  predniSONE (STERAPRED UNI-PAK 21 TAB) 10 MG (21) TBPK tablet As directed x 6 days 06/03/19   Ronnie Doss M, DO  Psyllium 48.57 % POWD Take 4 g by mouth at bedtime. Patient taking differently: Take 1 packet by mouth daily as needed (for constipation).  05/08/18   Rogene Houston, MD  senna (SENOKOT) 8.6 MG TABS tablet TAKE ONE TABLET BY MOUTH AT BEDTIME AS NEEDED FOR CONSTIPATION 05/28/19   Ronnie Doss M, DO  vitamin B-12 (CYANOCOBALAMIN) 50 MCG tablet Take 50 mcg by mouth at bedtime.    [provider]  vitamin C (ASCORBIC ACID) 500 MG tablet Take 500 mg by mouth at bedtime.    [provider]    Allergies    Aspirin and Roxicodone [oxycodone hcl]  Review of Systems   Review of Systems  All other systems reviewed and are negative.   Physical Exam Updated Vital Signs BP (!) 129/95 (BP Location: Right Arm)   Pulse 68   Temp  98.2 F (36.8 C) (Oral)   Resp 17   Ht 4\' 11"  (1.499 m)   Wt 68 kg   SpO2 99%   BMI 30.30 kg/m   Physical Exam Vitals and nursing note reviewed.  Constitutional:      General: She is in acute distress.     Appearance: Normal appearance. She is well-developed. She is not ill-appearing or toxic-appearing.  HENT:     Head: Normocephalic and atraumatic.     Right Ear: External ear normal.     Left Ear: External ear normal.     Nose: Nose normal. No mucosal edema or rhinorrhea.     Mouth/Throat:     Dentition: No dental abscesses.     Pharynx: No uvula swelling.  Eyes:     Extraocular Movements: Extraocular movements intact.     Conjunctiva/sclera: Conjunctivae normal.     Pupils: Pupils are equal, round, and reactive to light.  Cardiovascular:     Rate and Rhythm: Normal rate and regular rhythm.     Heart sounds: Normal heart sounds. No murmur. No friction rub. No gallop.   Pulmonary:     Effort: Pulmonary effort is normal. No respiratory distress.     Breath sounds: Normal breath sounds. No wheezing, rhonchi or rales.  Chest:     Chest wall: No tenderness or crepitus.  Abdominal:     General: Bowel sounds are normal. There is no distension.     Palpations: Abdomen is soft.     Tenderness: There is generalized abdominal tenderness. There is no guarding or rebound.     Comments: When patient strains I can  feel a bulge around her umbilicus.  Musculoskeletal:        General: No tenderness. Normal range of motion.     Cervical back: Full passive range of motion without pain, normal range of motion and neck supple.     Comments: Moves all extremities well.   Skin:    General: Skin is warm and dry.     Coloration: Skin is not pale.     Findings: No erythema or rash.  Neurological:     Mental Status: She is alert and oriented to person, place, and time.     Cranial Nerves: No cranial nerve deficit.  Psychiatric:        Mood and Affect: Mood is anxious. Affect is labile.         Speech: Speech is rapid and pressured.        Behavior: Behavior is agitated.        Thought Content: Thought content is paranoid. Thought content does not include homicidal or suicidal ideation. Thought content does not include homicidal or suicidal plan.     ED Results / Procedures / Treatments   Labs (all labs ordered are listed, but only abnormal results are displayed) Results for orders placed or performed during the hospital encounter of 06/18/19  Comprehensive metabolic panel  Result Value Ref Range   Sodium 141 135 - 145 mmol/L   Potassium 3.2 (L) 3.5 - 5.1 mmol/L   Chloride 102 98 - 111 mmol/L   CO2 25 22 - 32 mmol/L   Glucose, Bld 125 (H) 70 - 99 mg/dL   BUN 10 6 - 20 mg/dL   Creatinine, Ser 0.77 0.44 - 1.00 mg/dL   Calcium 9.4 8.9 - 10.3 mg/dL   Total Protein 7.7 6.5 - 8.1 g/dL   Albumin 4.7 3.5 - 5.0 g/dL   AST 73 (H) 15 - 41 U/L   ALT 70 (H) 0 - 44 U/L   Alkaline Phosphatase 70 38 - 126 U/L   Total Bilirubin 0.9 0.3 - 1.2 mg/dL   GFR calc non Af Amer >60 >60 mL/min   GFR calc Af Amer >60 >60 mL/min   Anion gap 14 5 - 15  Lipase, blood  Result Value Ref Range   Lipase 27 11 - 51 U/L  CBC with Differential  Result Value Ref Range   WBC 5.5 4.0 - 10.5 K/uL   RBC 3.80 (L) 3.87 - 5.11 MIL/uL   Hemoglobin 13.0 12.0 - 15.0 g/dL   HCT 37.8 36.0 - 46.0 %   MCV 99.5 80.0 - 100.0 fL   MCH 34.2 (H) 26.0 - 34.0 pg   MCHC 34.4 30.0 - 36.0 g/dL   RDW 13.7 11.5 - 15.5 %   Platelets 162 150 - 400 K/uL   nRBC 0.0 0.0 - 0.2 %   Neutrophils Relative % 50 %   Neutro Abs 2.8 1.7 - 7.7 K/uL   Lymphocytes Relative 36 %   Lymphs Abs 2.0 0.7 - 4.0 K/uL   Monocytes Relative 9 %   Monocytes Absolute 0.5 0.1 - 1.0 K/uL   Eosinophils Relative 4 %   Eosinophils Absolute 0.2 0.0 - 0.5 K/uL   Basophils Relative 1 %   Basophils Absolute 0.0 0.0 - 0.1 K/uL   Immature Granulocytes 0 %   Abs Immature Granulocytes 0.01 0.00 - 0.07 K/uL  I-Stat beta hCG blood, ED  Result Value Ref  Range   I-stat hCG, quantitative <5.0 <5 mIU/mL   Comment 3  Laboratory interpretation all normal except hypokalemia, nonfasting hyperglycemia, stable mild elevation of LFTs    EKG None  Radiology CT Abdomen Pelvis W Contrast  Result Date: 06/18/2019 CLINICAL DATA:  Lower abdominal pain and low back pain for 2 days. Tripped and fell a couple of times. EXAM: CT ABDOMEN AND PELVIS WITH CONTRAST TECHNIQUE: Multidetector CT imaging of the abdomen and pelvis was performed using the standard protocol following bolus administration of intravenous contrast. CONTRAST:  163mL OMNIPAQUE IOHEXOL 300 MG/ML  SOLN COMPARISON:  11/21/2016 FINDINGS: Lower chest: The lung bases are clear of acute process. No pleural effusion or pulmonary lesions. The heart is normal in size. No pericardial effusion. The distal esophagus and aorta are unremarkable. Hepatobiliary: No hepatic lesions or intrahepatic biliary dilatation. The gallbladder is surgically absent. No common bile duct dilatation. Pancreas: No mass, inflammation or ductal dilatation. Spleen: Normal size. No focal lesions. Adrenals/Urinary Tract: The adrenal glands and kidneys are unremarkable. Mild renal cortical scarring changes bilaterally, right greater than left. No renal or obstructing ureteral calculi. No worrisome renal lesions or findings for acute pyelonephritis. The bladder is unremarkable. Stomach/Bowel: The stomach, duodenum, small bowel and colon are grossly normal without oral contrast. No inflammatory changes, mass lesions or obstructive findings. The appendix is normal. Vascular/Lymphatic: The aorta is normal in caliber. No dissection. The branch vessels are patent. The major venous structures are patent. No mesenteric or retroperitoneal mass or adenopathy. Small scattered lymph nodes are noted. Reproductive: The uterus and ovaries are unremarkable. Other: No pelvic mass or adenopathy. No free pelvic fluid collections. No inguinal mass or  adenopathy. No abdominal wall hernia or subcutaneous lesions. Musculoskeletal: No significant or acute bony findings. IMPRESSION: 1. No acute abdominal/pelvic findings, mass lesions or adenopathy. 2. Status post cholecystectomy. No biliary dilatation. 3. Mild renal cortical scarring changes bilaterally, right greater than left. 4. No acute bony findings. Electronically Signed   By: Marijo Sanes M.D.   On: 06/18/2019 07:29    Procedures Procedures (including critical care time)  Colonoscopy May 17, 2018 Impression:       - Decreased sphincter tone found on digital                              rectal exam.                           - Three small polyps in the transverse colon.                            Biopsied.                           - External hemorrhoids.  Medications Ordered in ED Medications  amLODipine (NORVASC) tablet 10 mg (has no administration in time range)  bictegravir-emtricitabine-tenofovir AF (BIKTARVY) 50-200-25 MG per tablet 1 tablet (has no administration in time range)  loratadine (CLARITIN) tablet 10 mg (has no administration in time range)  FLUoxetine (PROZAC) capsule 20 mg (has no administration in time range)  fluticasone (FLONASE) 50 MCG/ACT nasal spray 2 spray (has no administration in time range)  potassium chloride SA (KLOR-CON) CR tablet 40 mEq (has no administration in time range)  iohexol (OMNIPAQUE) 300 MG/ML solution 100 mL (100 mLs Intravenous Contrast Given 06/18/19 0709)    ED Course  I have reviewed the triage vital  signs and the nursing notes.  Pertinent labs & imaging results that were available during my care of the patient were reviewed by me and considered in my medical decision making (see chart for details).    MDM Rules/Calculators/A&P                     When I look at patient's prior surgical procedures there is a surgical repair of her hernia in August by Dr. Constance Haw.  Patient has a history of's schizophrenia and she seems to be  having some delusions, possible hallucinations because she thinks people are talking about her and she actually sees people.  She also thinks she is pregnant.  CT the abdomen/pelvis was done after negative pregnancy test without acute findings to explain her complaints of pain.  TTS consult was ordered.  Patient has prior history of depression.  Her hyperkalemia was treated with oral potassium.  Dr. Helane Gunther was made aware of patient at change of shift at 8:20 AM.  Final Clinical Impression(s) / ED Diagnoses Final diagnoses:  Delusions ([place])  Severe episode of recurrent major depressive disorder, with psychotic features ([place])  Generalized abdominal pain  Low back pain without sciatica, unspecified back pain laterality, unspecified chronicity  Hypokalemia    Rx / DC Orders   Disposition pending  Rolland Porter, MD, Barbette Or, MD 06/18/19 [phone]
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
## 2019-06-19 ENCOUNTER — Inpatient Hospital Stay
Admission: AD | Admit: 2019-06-19 | Discharge: 2019-06-26 | DRG: 885 | Disposition: A | Discharge status: Home / Self Care | Payer: Medicaid Other | Source: Intra-hospital | Attending: Psychiatry | Admitting: Psychiatry

## 2019-06-19 DIAGNOSIS — Z21 Asymptomatic human immunodeficiency virus [HIV] infection status: Secondary | ICD-10-CM | POA: Diagnosis present

## 2019-06-19 DIAGNOSIS — F419 Anxiety disorder, unspecified: Secondary | ICD-10-CM | POA: Diagnosis present

## 2019-06-19 DIAGNOSIS — K219 Gastro-esophageal reflux disease without esophagitis: Secondary | ICD-10-CM | POA: Diagnosis present

## 2019-06-19 DIAGNOSIS — B1920 Unspecified viral hepatitis C without hepatic coma: Secondary | ICD-10-CM | POA: Diagnosis present

## 2019-06-19 DIAGNOSIS — J439 Emphysema, unspecified: Secondary | ICD-10-CM

## 2019-06-19 DIAGNOSIS — I152 Hypertension secondary to endocrine disorders: Secondary | ICD-10-CM | POA: Diagnosis present

## 2019-06-19 DIAGNOSIS — Z20822 Contact with and (suspected) exposure to covid-19: Secondary | ICD-10-CM | POA: Diagnosis present

## 2019-06-19 DIAGNOSIS — F333 Major depressive disorder, recurrent, severe with psychotic symptoms: Secondary | ICD-10-CM | POA: Diagnosis not present

## 2019-06-19 DIAGNOSIS — F209 Schizophrenia, unspecified: Secondary | ICD-10-CM | POA: Diagnosis present

## 2019-06-19 DIAGNOSIS — F17210 Nicotine dependence, cigarettes, uncomplicated: Secondary | ICD-10-CM | POA: Diagnosis present

## 2019-06-19 DIAGNOSIS — F312 Bipolar disorder, current episode manic severe with psychotic features: Secondary | ICD-10-CM | POA: Diagnosis not present

## 2019-06-19 DIAGNOSIS — F319 Bipolar disorder, unspecified: Principal | ICD-10-CM | POA: Diagnosis present

## 2019-06-19 DIAGNOSIS — G4700 Insomnia, unspecified: Secondary | ICD-10-CM | POA: Diagnosis present

## 2019-06-19 DIAGNOSIS — E1169 Type 2 diabetes mellitus with other specified complication: Secondary | ICD-10-CM | POA: Diagnosis present

## 2019-06-19 DIAGNOSIS — E1159 Type 2 diabetes mellitus with other circulatory complications: Secondary | ICD-10-CM | POA: Diagnosis present

## 2019-06-19 DIAGNOSIS — B20 Human immunodeficiency virus [HIV] disease: Secondary | ICD-10-CM | POA: Diagnosis present

## 2019-06-19 LAB — CBG MONITORING, ED: Glucose-Capillary: 132 mg/dL — ABNORMAL HIGH (ref 70–99)

## 2019-06-19 LAB — RESPIRATORY PANEL BY RT PCR (FLU A&B, COVID)
Influenza A by PCR: NEGATIVE
Influenza B by PCR: NEGATIVE
SARS Coronavirus 2 by RT PCR: NEGATIVE

## 2019-06-19 MED ORDER — FLUTICASONE PROPIONATE 50 MCG/ACT NA SUSP
2.0000 | Freq: Every day | NASAL | Status: DC
  Administered 2019-06-20 – 2019-06-25 (×5): 2 via NASAL
  Filled 2019-06-19 (×2): qty 16

## 2019-06-19 MED ORDER — BICTEGRAVIR-EMTRICITAB-TENOFOV 50-200-25 MG PO TABS
1.0000 | ORAL_TABLET | Freq: Every day | ORAL | Status: DC
  Administered 2019-06-20 – 2019-06-26 (×7): 1 via ORAL
  Filled 2019-06-19 (×8): qty 1

## 2019-06-19 MED ORDER — HYDROXYZINE HCL 25 MG PO TABS
25.0000 mg | ORAL_TABLET | Freq: Three times a day (TID) | ORAL | Status: DC | PRN
  Administered 2019-06-19 – 2019-06-20 (×2): 25 mg via ORAL
  Filled 2019-06-19 (×2): qty 1

## 2019-06-19 MED ORDER — LORATADINE 10 MG PO TABS
10.0000 mg | ORAL_TABLET | Freq: Every day | ORAL | Status: DC
  Administered 2019-06-20 – 2019-06-26 (×7): 10 mg via ORAL
  Filled 2019-06-19 (×7): qty 1

## 2019-06-19 MED ORDER — FLUOXETINE HCL 20 MG PO CAPS
20.0000 mg | ORAL_CAPSULE | Freq: Every day | ORAL | Status: DC
  Administered 2019-06-20: 20 mg via ORAL
  Filled 2019-06-19: qty 1

## 2019-06-19 MED ORDER — ALBUTEROL SULFATE (2.5 MG/3ML) 0.083% IN NEBU
3.0000 mL | INHALATION_SOLUTION | Freq: Four times a day (QID) | RESPIRATORY_TRACT | Status: DC | PRN
  Administered 2019-06-20 – 2019-06-24 (×2): 3 mL via RESPIRATORY_TRACT
  Filled 2019-06-19: qty 9
  Filled 2019-06-19: qty 3

## 2019-06-19 MED ORDER — ACETAMINOPHEN 325 MG PO TABS
650.0000 mg | ORAL_TABLET | Freq: Four times a day (QID) | ORAL | Status: DC | PRN
  Administered 2019-06-20 – 2019-06-26 (×8): 650 mg via ORAL
  Filled 2019-06-19 (×9): qty 2

## 2019-06-19 MED ORDER — TRAZODONE HCL 50 MG PO TABS
50.0000 mg | ORAL_TABLET | Freq: Every evening | ORAL | Status: DC | PRN
  Administered 2019-06-19 – 2019-06-26 (×4): 50 mg via ORAL
  Filled 2019-06-19 (×4): qty 1

## 2019-06-19 MED ORDER — MAGNESIUM HYDROXIDE 400 MG/5ML PO SUSP
30.0000 mL | Freq: Every day | ORAL | Status: DC | PRN
  Administered 2019-06-25: 30 mL via ORAL
  Filled 2019-06-19: qty 30

## 2019-06-19 MED ORDER — AMLODIPINE BESYLATE 5 MG PO TABS
10.0000 mg | ORAL_TABLET | Freq: Every day | ORAL | Status: DC
  Administered 2019-06-20 – 2019-06-26 (×7): 10 mg via ORAL
  Filled 2019-06-19 (×7): qty 2

## 2019-06-19 MED ORDER — ALUM & MAG HYDROXIDE-SIMETH 200-200-20 MG/5ML PO SUSP
30.0000 mL | ORAL | Status: DC | PRN
  Administered 2019-06-21 – 2019-06-23 (×6): 30 mL via ORAL
  Filled 2019-06-19 (×6): qty 30

## 2019-06-19 NOTE — ED Notes (Signed)
Pt served IVC papers and papers faxed to [facility] per calvin
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
## 2019-06-19 NOTE — BH Assessment (Signed)
Patient has been accepted to [facility].  Accepted by Nurse Practitioner [facility], on the behalf of Dr. Weber Cooks..  Attending  Physician will be Dr. Weber Cooks.  Patient has been assigned to room 304, by [place]   Call report to [phone].  Representative/Transfer Coordinator is Dispensing optician Patient pre-admitted by [facility] Patient Access ([place])   [facility] Staff ([place], Disposition Social Worker) made aware of acceptance.

## 2019-06-19 NOTE — ED Notes (Signed)
Spoke with son Ronalee Belts asking about Pt status. And visitation hrs.
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
## 2019-06-19 NOTE — ED Provider Notes (Signed)
Patient has been accepted at [facility].  Patient now not wanting to go.  But behavioral health recommending inpatient treatment.  So patient will be IVC.   Fredia Sorrow, MD 06/19/19 1620

## 2019-06-19 NOTE — BHH Counselor (Signed)
Clinician received a call from Amy at [facility]. Pt is under review.     Vertell Novak, [place], [facility], [facility] Triage Specialist [phone]

## 2019-06-19 NOTE — ED Notes (Signed)
Pt noted to be getting more aggressive, crying walking around her room, Upset that she can not just go home. Continues to say that she is pregnant, and not crazy. Pt will yell at nurse when talking at times, and threatens to call lawyers and sue the hospital. Charge Nurse came into pt room with said nurse due to concern of  [facility] nurse safety due to escalation. Dr. And EDP aware of IVC paperwork to begin.

## 2019-06-19 NOTE — ED Notes (Signed)
Pt assisted to the shower with this NT. This NT gathered the pt toiletries and the pt was able to take her shower independently.

## 2019-06-19 NOTE — ED Notes (Signed)
Called BH to let them know pt will be IVC'D due to more aggressive.

## 2019-06-19 NOTE — ED Provider Notes (Signed)
Emergency Medicine Observation Re-evaluation Note  JANELIZ RUNKLE is a 54 y.o. female, seen on rounds today.  Pt initially presented to the ED for complaints of Mental Health Problem Currently, the patient is awaiting placement.  Physical Exam  BP (!) 131/96 (BP Location: Left Arm)   Pulse 65   Temp 98.4 F (36.9 C) (Oral)   Resp 18   Ht 4\' 11"  (1.499 m)   Wt 68 kg   SpO2 100%   BMI 30.30 kg/m  Physical Exam  Alert. No acute distress.  Even, unlabored respirations.   ED Course / MDM  EKG:    I have reviewed the labs performed to date as well as medications administered while in observation.  Recent changes in the last 24 hours include [place] reviewing case for possible admission.  Plan  Current plan is for TTS disposition. Seeking placement. Patient under review and [facility].   12:30 PM  Patient's son here to visit but here with a child so cannot allow back due to visitor restrictions. Made TTS aware that he is requesting an update on patient's status.    Margette Fast, MD 06/20/19 [phone]

## 2019-06-19 NOTE — ED Notes (Signed)
Calvin from [facility] called and stated for the nurse to call report at 73pm.  Also Son Ronalee Belts would like a update call when pt leaves the facility.

## 2019-06-20 ENCOUNTER — Other Ambulatory Visit: Payer: Self-pay

## 2019-06-20 ENCOUNTER — Encounter: Payer: Self-pay | Admitting: Psychiatry

## 2019-06-20 DIAGNOSIS — F312 Bipolar disorder, current episode manic severe with psychotic features: Secondary | ICD-10-CM

## 2019-06-20 LAB — TSH: TSH: 0.7 u[IU]/mL (ref 0.350–4.500)

## 2019-06-20 LAB — HEMOGLOBIN A1C
Hgb A1c MFr Bld: 5.5 % (ref 4.8–5.6)
Mean Plasma Glucose: 111.15 mg/dL

## 2019-06-20 MED ORDER — QUETIAPINE FUMARATE 200 MG PO TABS
300.0000 mg | ORAL_TABLET | Freq: Every day | ORAL | Status: DC
  Administered 2019-06-20 – 2019-06-25 (×6): 300 mg via ORAL
  Filled 2019-06-20 (×6): qty 1

## 2019-06-20 NOTE — BHH Suicide Risk Assessment (Signed)
[facility] Admission Suicide Risk Assessment   Nursing information obtained from:  Patient Demographic factors:  NA Current Mental Status:  NA Loss Factors:  NA Historical Factors:  NA Risk Reduction Factors:  NA  Total Time spent with patient: 1 hour Principal Problem: Bipolar affective ([place]) Diagnosis:  Principal Problem:   Bipolar affective ([place]) Active Problems:   Human immunodeficiency virus (HIV) disease ([place])   Hepatitis C virus infection without hepatic coma   Hypertension associated with diabetes ([place])  Subjective Data: Patient seen chart reviewed.  With her consent I also spoke with her son.  This is a 54 year old woman sent here from any [facility] because of confusion and agitation and psychotic symptoms.  Patient is denying suicidal or homicidal ideation.  Claims to still be compliant with medicine.  She is disorganized and a little irritable and seems to be hypomanic possibly manic.  Continued Clinical Symptoms:  Alcohol Use Disorder Identification Test Final Score (AUDIT): 0 The "Alcohol Use Disorders Identification Test", Guidelines for Use in Primary Care, Second Edition.  World Pharmacologist [facility]). Score between 0-7:  no or low risk or alcohol related problems. Score between 8-15:  moderate risk of alcohol related problems. Score between 16-19:  high risk of alcohol related problems. Score 20 or above:  warrants further diagnostic evaluation for alcohol dependence and treatment.   CLINICAL FACTORS:   Bipolar Disorder:   Mixed State Currently Psychotic   Musculoskeletal: Strength & Muscle Tone: within normal limits Gait & Station: normal Patient leans: N/A  Psychiatric Specialty Exam: Physical Exam  Nursing note and vitals reviewed. Constitutional: She appears well-developed and well-nourished.  HENT:  Head: Normocephalic and atraumatic.  Eyes: Pupils are equal, round, and reactive to light. Conjunctivae are normal.  Cardiovascular: Regular rhythm and  normal heart sounds.  Respiratory: Effort normal.  GI: Soft.  Musculoskeletal:        General: Normal range of motion.     Cervical back: Normal range of motion.  Neurological: She is alert.  Skin: Skin is warm and dry.  Psychiatric: Her mood appears anxious. Her affect is labile. Her speech is rapid and/or pressured and tangential. She is agitated. She is not aggressive and not hyperactive. Thought content is paranoid and delusional. Cognition and memory are impaired. She expresses impulsivity. She expresses no homicidal and no suicidal ideation.    Review of Systems  Constitutional: Negative.   HENT: Negative.   Eyes: Negative.   Respiratory: Negative.   Cardiovascular: Negative.   Gastrointestinal: Negative.   Musculoskeletal: Negative.   Skin: Negative.   Neurological: Negative.   Psychiatric/Behavioral: Positive for behavioral problems and dysphoric mood. The patient is nervous/anxious.     Blood pressure 122/87, pulse 82, temperature 98.7 F (37.1 C), temperature source Oral, resp. rate 18, height 4\' 11"  (1.499 m), weight 68 kg, SpO2 99 %.Body mass index is 30.28 kg/m.  General Appearance: Casual  Eye Contact:  Good  Speech:  Pressured  Volume:  Increased  Mood:  Irritable  Affect:  Labile  Thought Process:  Disorganized  Orientation:  Full (Time, Place, and Person)  Thought Content:  Illogical and Paranoid Ideation  Suicidal Thoughts:  No  Homicidal Thoughts:  No  Memory:  Immediate;   Fair Recent;   Poor Remote;   Poor  Judgement:  Impaired  Insight:  Shallow  Psychomotor Activity:  Restlessness  Concentration:  Concentration: Fair  Recall:  [employer] of Knowledge:  Fair  Language:  Fair  Akathisia:  No  Handed:  Right  AIMS (if indicated):     Assets:  Desire for Improvement Housing Resilience  ADL's:  Intact  Cognition:  Impaired,  Mild  Sleep:  Number of Hours: 5.5      COGNITIVE FEATURES THAT CONTRIBUTE TO RISK:  Polarized thinking    SUICIDE  RISK:   Minimal: No identifiable suicidal ideation.  Patients presenting with no risk factors but with morbid ruminations; may be classified as minimal risk based on the severity of the depressive symptoms  PLAN OF CARE: Patient with bipolar disorder appears to be manic with possibly some delusional psychotic believes.  According to her son she has been more agitated and delusional recently.  Not currently threatening or suicidal.  Continue 15-minute checks.  Continue medication management.  Reassess in an ongoing way and try to get collateral information before discharge  I certify that inpatient services furnished can reasonably be expected to improve the patient's condition.   Alethia Berthold, MD 06/20/2019, 11:42 AM
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
## 2019-06-20 NOTE — Progress Notes (Signed)
Recreation Therapy Notes  Date: 06/20/2019  Time: 9:30 am  Location: Craft Room  Behavioral response: Appropriate   Intervention Topic: Communication   Discussion/Intervention:  Group content today was focused on communication. The group defined communication and ways to communicate with others. Individuals stated reason why communication is important and some reasons to communicate with others. Patients expressed if they thought they were good at communicating with others and ways they could improve their communication skills. The group identified important parts of communication and some experiences they have had in the past with communication. The group participated in the intervention "What is that?", where they had a chance to test out their communication skills and identify ways to improve their communication techniques.  Clinical Observations/Feedback:  Patient came to group late due to unknown reasons. Individual was social with peers and staff while participating in the intervention.   Jerin Franzel LRT/CTRS         Stan Cantave 06/20/2019 12:06 PM

## 2019-06-20 NOTE — Plan of Care (Signed)
Patient new to the unit tonight  Problem: Education: Goal: Knowledge of  General Education information/materials will improve Outcome: Not Progressing Goal: Emotional status will improve Outcome: Not Progressing Goal: Mental status will improve Outcome: Not Progressing Goal: Verbalization of understanding the information provided will improve Outcome: Not Progressing   Problem: Safety: Goal: Periods of time without injury will increase Outcome: Not Progressing   Problem: Activity: Goal: Will verbalize the importance of balancing activity with adequate rest periods Outcome: Not Progressing   Problem: Safety: Goal: Ability to redirect hostility and anger into socially appropriate behaviors will improve Outcome: Not Progressing Goal: Ability to remain free from injury will improve Outcome: Not Progressing

## 2019-06-20 NOTE — B[facility] Group Notes (Signed)

## 2019-06-20 NOTE — Progress Notes (Signed)
Admission Note:  54 yr Female who presents IVC in no acute distress for the treatment of SI and Depression, she appears flat and depressed she was crying upon arrival, her thoughts are disorganized and incoherent, she was later calm and cooperative with admission process. Patient currently denies SI/HI/AVH  and contracts for safety upon admission.patient has past health hx  HIV, dm, HTN, Asthma, Anxiety COPD, scoliosis and Hepatitis.   Patient's skin was assessed and found to be dry, she was noted to have scabs on her right hand, she also has a healing scab on her forehead she stated " I fell recently" . Patient was searched and no contraband found, POC and unit policies explained and understanding verbalized, consents obtained. Food and fluids offered, and patient accepted it. 15 minutes safety checks maintained
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
## 2019-06-20 NOTE — H&P (Signed)
Psychiatric Admission Assessment Adult  Patient Identification: Ashley [name] MRN:  [id number] Date of Evaluation:  06/20/2019 Chief Complaint:  Bipolar affective ([place]) [F31.9] Principal Diagnosis: Bipolar affective ([place]) Diagnosis:  Principal Problem:   Bipolar affective ([place]) Active Problems:   Human immunodeficiency virus (HIV) disease ([place])   Hepatitis C virus infection without hepatic coma   Hypertension associated with diabetes ([place])  History of Present Illness: Patient seen chart reviewed.  Also with her consent I spoke with her son Ashley [name].  His telephone number is [phone].  This is a 54 year old woman sent here from any [facility].  She presented there a couple days ago complaining of abdominal pain and evidently expressing the belief that she was pregnant.  Patient was documented as being disorganized and possibly delusional and it was thought that she needed inpatient psychiatric treatment.  Patient is rambling and disorganized in her thinking and not necessarily the best possible historian.  She tells me that recently she has been under some stress from a variety of things.  She says that a boyfriend of hers, who she at one point refers to as her husband but later clarifies is not really her legal husband, just recently got out of [facility] prison.  She tells a somewhat confusing story about another woman who she says is trying to come between her and this man.  Patient however denies any suicidal or homicidal ideation.  She does mention that she had recently had the belief that she was pregnant but she does not appear to be firmly still believing that.  She acknowledges that she had had a hernia recently and that some of her changes in her abdomen could be related to that.  Patient claims to still be compliant with her medicine although she complained to me multiple times about her Seroquel raising the question with me as to whether she was really taking it.  Patient denies any  suicidal or homicidal ideation.  Denies any current hallucinations.  She admits to regular marijuana use but says she has not used cocaine in a couple of years.  Her son tells me that for the past 2 weeks approximately they have noticed that she is not really herself.  She has been talking more about being pregnant and been more confused and disorganized in her thinking.  He had no idea that she had any mental health history and has no idea about medicine compliance. Associated Signs/Symptoms: Depression Symptoms:  insomnia, psychomotor agitation, anxiety, disturbed sleep, (Hypo) Manic Symptoms:  Flight of Ideas, Impulsivity, Irritable Mood, Labiality of Mood, Anxiety Symptoms:  Excessive Worry, Psychotic Symptoms:  Delusions, Paranoia, PTSD Symptoms: Negative Total Time spent with patient: 1 hour  Past Psychiatric History: Patient has a history of mood disorder.  In 2016 she was [facility] with what seems like depressive symptoms and was diagnosed with depression with psychotic features although the diagnosis of bipolar is present in her chart as well.  Patient reports that she had had hospitalizations before that.  She admits to having had suicidal thoughts several years ago but denies anything recently.  [facility] and says that she is regularly on Prozac trazodone and Seroquel.  Denies any homicidal behavior.  She admits to a history of crack cocaine abuse but says she has been sober for a couple of years.  She is HIV positive but claims to be fully compliant with her medication  Is the patient at risk to self?  No.  Has the patient been a risk to self in the past 6 months? No.  Has the patient been a risk to self within the distant past? Yes.    Is the patient a risk to others? No.  Has the patient been a risk to others in the past 6 months? No.  Has the patient been a risk to others within the distant past? No.   Prior  Inpatient Therapy:   Prior Outpatient Therapy:    Alcohol Screening: 1. How often do you have a drink containing alcohol?: Never 2. How many drinks containing alcohol do you have on a typical day when you are drinking?: 1 or 2 3. How often do you have six or more drinks on one occasion?: Never AUDIT-C Score: 0 4. How often during the last year have you found that you were not able to stop drinking once you had started?: Never 5. How often during the last year have you failed to do what was normally expected from you becasue of drinking?: Never 6. How often during the last year have you needed a first drink in the morning to get yourself going after a heavy drinking session?: Never 7. How often during the last year have you had a feeling of guilt of remorse after drinking?: Never 8. How often during the last year have you been unable to remember what happened the night before because you had been drinking?: Never 9. Have you or someone else been injured as a result of your drinking?: No 10. Has a relative or friend or a doctor or another health worker been concerned about your drinking or suggested you cut down?: No Alcohol Use Disorder Identification Test Final Score (AUDIT): 0 Alcohol Brief Interventions/Follow-up: AUDIT Score <7 follow-up not indicated Substance Abuse History in the last 12 months:  Yes.   Consequences of Substance Abuse: Presumably regular marijuana use is not very good for her. Previous Psychotropic Medications: Yes  Psychological Evaluations: Yes  Past Medical History:  Past Medical History:  Diagnosis Date  . Ankle fracture   . Anxiety   . Asthma   . Collagen vascular disease ([place])   . COPD (chronic obstructive pulmonary disease) ([place])   . Depression   . Diabetes mellitus   . Elevated LFTs 01/13/2012  . Hepatitis C   . Hernia   . HIV (human immunodeficiency virus infection) ([place])   . HIV (human immunodeficiency virus infection) ([place])   . Hypertension   .  Hypothyroidism   . Pinched nerve   . Scoliosis   . TB (tuberculosis)     Past Surgical History:  Procedure Laterality Date  . BIOPSY  05/08/2018   Procedure: biopsy;  Surgeon: Rogene Houston, MD;  [facility];  Service: Endoscopy;;  proximal transverse colon polyp  . CESAREAN SECTION    . CHOLECYSTECTOMY    . COLONOSCOPY N/A 05/08/2018   Procedure: COLONOSCOPY;  Surgeon: Rogene Houston, MD;  Location: [facility];  Service: Endoscopy;  Laterality: N/A;  2:00  . HERNIA REPAIR    . INCISIONAL HERNIA REPAIR N/A 09/30/2018   Procedure: OPEN HERNIA REPAIR INCISIONAL WITH MESH;  Surgeon: Virl Cagey, MD;  Location: [facility];  Service: General;  Laterality: N/A;  . LIVER BIOPSY     Family History:  Family History  Problem Relation Age of Onset  . Diabetes Mother   . Hypertension Mother   . Stroke Mother        died [place]  2015  . Cancer Sister   . Cancer Maternal Aunt    Family Psychiatric  History: Denies knowing of any Tobacco Screening: Have you used any form of tobacco in the last 30 days? (Cigarettes, Smokeless Tobacco, Cigars, and/or Pipes): Yes Tobacco use, Select all that apply: 4 or less cigarettes per day Are you interested in Tobacco Cessation Medications?: No, patient refused Counseled patient on smoking cessation including recognizing danger situations, developing coping skills and basic information about quitting provided: Refused/Declined practical counseling Social History:  Social History   Substance and Sexual Activity  Alcohol Use Not Currently  . Alcohol/week: 3.0 standard drinks  . Types: 3 Cans of beer per week     Social History   Substance and Sexual Activity  Drug Use Not Currently  . Types: Marijuana, Cocaine    Additional Social History: Marital status: Long term relationship Long term relationship, how long?: 2 years What types of issues is patient dealing with in the relationship?: Pt denies. Are you sexually active?: Yes What  is your sexual orientation?: Heterosexual Has your sexual activity been affected by drugs, alcohol, medication, or emotional stress?: Pt denies. Does patient have children?: Yes How many children?: 2 How is patient's relationship with their children?: Pt reports that she has a good realtionship with her oldest son but a conflictual one with her son.                         Allergies:   Allergies  Allergen Reactions  . Aspirin Other (See Comments)    Liver problems  . Roxicodone [Oxycodone Hcl] Itching   Lab Results:  Results for orders placed or performed during the hospital encounter of 06/18/19 (from the past 48 hour(s))  CBG monitoring, ED     Status: Abnormal   Collection Time: 06/19/19  7:41 AM  Result Value Ref Range   Glucose-Capillary 132 (H) 70 - 99 mg/dL    Comment: Glucose reference range applies only to samples taken after fasting for at least 8 hours.  Respiratory Panel by RT PCR (Flu A&B, Covid) - Nasopharyngeal Swab     Status: None   Collection Time: 06/19/19 10:49 AM   Specimen: Nasopharyngeal Swab  Result Value Ref Range   SARS Coronavirus 2 by RT PCR NEGATIVE NEGATIVE    Comment: (NOTE) SARS-CoV-2 target nucleic acids are NOT DETECTED. The SARS-CoV-2 RNA is generally detectable in upper respiratoy specimens during the acute phase of infection. The lowest concentration of SARS-CoV-2 viral copies this assay can detect is 131 copies/mL. A negative result does not preclude SARS-Cov-2 infection and should not be used as the sole basis for treatment or other patient management decisions. A negative result may occur with  improper specimen collection/handling, submission of specimen other than nasopharyngeal swab, presence of viral mutation(s) within the areas targeted by this assay, and inadequate number of viral copies (<131 copies/mL). A negative result must be combined with clinical observations, patient history, and epidemiological information.  The expected result is Negative. Fact Sheet for Patients:  PinkCheek.be Fact Sheet for Healthcare Providers:  GravelBags.it This test is not yet ap proved or cleared by the [place] FDA and  has been authorized for detection and/or diagnosis of SARS-CoV-2 by FDA under an Emergency Use Authorization (EUA). This EUA will remain  in effect (meaning this test can be used) for the duration of the COVID-19 declaration under Section 564(b)(1) of the Act, 21 U.S.C. section 360bbb-3(b)(1), unless the authorization is  terminated or revoked sooner.    Influenza A by PCR NEGATIVE NEGATIVE   Influenza B by PCR NEGATIVE NEGATIVE    Comment: (NOTE) The Xpert Xpress SARS-CoV-2/FLU/RSV assay is intended as an aid in  the diagnosis of influenza from Nasopharyngeal swab specimens and  should not be used as a sole basis for treatment. Nasal washings and  aspirates are unacceptable for Xpert Xpress SARS-CoV-2/FLU/RSV  testing. Fact Sheet for Patients: PinkCheek.be Fact Sheet for Healthcare Providers: GravelBags.it This test is not yet approved or cleared by the [place] FDA and  has been authorized for detection and/or diagnosis of SARS-CoV-2 by  FDA under an Emergency Use Authorization (EUA). This EUA will remain  in effect (meaning this test can be used) for the duration of the  Covid-19 declaration under Section 564(b)(1) of the Act, 21  U.S.C. section 360bbb-3(b)(1), unless the authorization is  terminated or revoked. Performed at [facility], [address], [place], [place] [address]     Blood Alcohol level:  Lab Results  Component Value Date   [facility] <10 06/18/2019   ETH <5 [id number]    Metabolic Disorder Labs:  Lab Results  Component Value Date   HGBA1C 5.4 09/27/2018   MPG 108.28 09/27/2018   MPG 114 05/01/2014   No results found for: PROLACTIN Lab Results   Component Value Date   CHOL 165 08/08/2017   TRIG 107 08/08/2017   HDL 66 08/08/2017   CHOLHDL 2.5 08/08/2017   VLDL 16 06/24/2015   LDLCALC 78 08/08/2017   LDLCALC 75 01/24/2016    Current Medications: Current Facility-Administered Medications  Medication Dose Route Frequency Provider Last Rate Last Admin  . acetaminophen (TYLENOL) tablet 650 mg  650 mg Oral Q6H PRN Money, Lowry Ram, FNP      . albuterol (PROVENTIL) (2.5 MG/3ML) 0.083% nebulizer solution 3 mL  3 mL Inhalation Q6H PRN Money, Lowry Ram, FNP   3 mL at 06/20/19 0727  . alum & mag hydroxide-simeth (MAALOX/MYLANTA) 200-200-20 MG/5ML suspension 30 mL  30 mL Oral Q4H PRN Money, Darnelle Maffucci B, FNP      . amLODipine (NORVASC) tablet 10 mg  10 mg Oral Daily Money, Darnelle Maffucci B, FNP   10 mg at 06/20/19 0830  . bictegravir-emtricitabine-tenofovir AF (BIKTARVY) 50-200-25 MG per tablet 1 tablet  1 tablet Oral Daily Money, Lowry Ram, FNP   1 tablet at 06/20/19 0931  . fluticasone (FLONASE) 50 MCG/ACT nasal spray 2 spray  2 spray Each Nare Daily Money, Lowry Ram, FNP   2 spray at 06/20/19 0931  . hydrOXYzine (ATARAX/VISTARIL) tablet 25 mg  25 mg Oral TID PRN Money, Lowry Ram, FNP   25 mg at 06/19/19 2308  . loratadine (CLARITIN) tablet 10 mg  10 mg Oral Daily Money, Lowry Ram, FNP   10 mg at 06/20/19 0830  . magnesium hydroxide (MILK OF MAGNESIA) suspension 30 mL  30 mL Oral Daily PRN Money, Lowry Ram, FNP      . QUEtiapine (SEROQUEL) tablet 300 mg  300 mg Oral QHS Keeven Matty T, MD      . traZODone (DESYREL) tablet 50 mg  50 mg Oral QHS PRN Money, Lowry Ram, FNP   50 mg at 06/19/19 2308   PTA Medications: Medications Prior to Admission  Medication Sig Dispense Refill Last Dose  . albuterol (VENTOLIN HFA) 108 (90 Base) MCG/ACT inhaler Inhale 2 puffs into the lungs every 6 (six) hours as needed for wheezing or shortness of breath. 18 g 1   .  amLODipine (NORVASC) 10 MG tablet Take 1 tablet (10 mg total) by mouth at bedtime. 90 tablet 3   . BIKTARVY  50-200-25 MG TABS tablet TAKE ONE TABLET BY MOUTH EVERY DAY 30 tablet 0   . cetirizine (ZYRTEC) 10 MG tablet Take 1 tablet (10 mg total) by mouth daily. 90 tablet 3   . diclofenac Sodium (VOLTAREN) 1 % GEL Apply 2 g topically 4 (four) times daily. 200 g 2   . docusate sodium (COLACE) 100 MG capsule Take 1 capsule (100 mg total) by mouth 2 (two) times daily. 60 capsule 2   . FLUoxetine (PROZAC) 20 MG capsule Take 1 capsule (20 mg total) by mouth daily. 90 capsule 3   . fluticasone (FLONASE) 50 MCG/ACT nasal spray Place 2 sprays into both nostrils daily. 48 g 3   . megestrol (MEGACE ES) 625 MG/5ML suspension Take 5 mLs (625 mg total) by mouth daily. 150 mL 6   . methocarbamol (ROBAXIN) 500 MG tablet TAKE ONE TABLET BY MOUTH EVERY 8 HOURS AS NEEDED FOR MUSCLE SPASMS 30 tablet 0   . Multiple Vitamin (MULTI-VITAMINS) TABS Take 1 tablet by mouth daily. 90 tablet 1   . Multiple Vitamins-Minerals (TAB-A-VITE) TABS TAKE ONE TABLET BY MOUTH DAILY 90 tablet 0   . Omega-3 Fatty Acids (FISH OIL) 1000 MG CAPS Take 1,000 mg by mouth at bedtime.     . pantoprazole (PROTONIX) 40 MG tablet Take 1 tablet (40 mg total) by mouth at bedtime. 90 tablet 3   . Polyethylene Glycol 3350 (PEG 3350) 17 g PACK Mix 1 packet in 8 ounces of fluid and drink daily IF needed for constipation. 30 each prn   . predniSONE (STERAPRED UNI-PAK 21 TAB) 10 MG (21) TBPK tablet As directed x 6 days 21 tablet 0   . Psyllium 48.57 % POWD Take 4 g by mouth at bedtime. (Patient taking differently: Take 1 packet by mouth daily as needed (for constipation). )  0   . senna (SENOKOT) 8.6 MG TABS tablet TAKE ONE TABLET BY MOUTH AT BEDTIME AS NEEDED FOR CONSTIPATION 30 tablet 0   . vitamin B-12 (CYANOCOBALAMIN) 50 MCG tablet Take 50 mcg by mouth at bedtime.     . vitamin C (ASCORBIC ACID) 500 MG tablet Take 500 mg by mouth at bedtime.       Musculoskeletal: Strength & Muscle Tone: within normal limits Gait & Station: normal Patient leans:  N/A  Psychiatric Specialty Exam: Physical Exam  Nursing note and vitals reviewed. Constitutional: She appears well-developed and well-nourished.  HENT:  Head: Normocephalic and atraumatic.  Eyes: Pupils are equal, round, and reactive to light. Conjunctivae are normal.  Cardiovascular: Regular rhythm and normal heart sounds.  Respiratory: Effort normal.  GI: Soft.  Musculoskeletal:        General: Normal range of motion.     Cervical back: Normal range of motion.  Neurological: She is alert.  Skin: Skin is warm and dry.  Psychiatric: Her affect is labile. Her speech is rapid and/or pressured and tangential. She is agitated. She is not aggressive. Thought content is paranoid and delusional. Cognition and memory are impaired. She expresses impulsivity and inappropriate judgment. She expresses no homicidal and no suicidal ideation.    Review of Systems  Constitutional: Negative.   HENT: Negative.   Eyes: Negative.   Respiratory: Negative.   Cardiovascular: Negative.   Gastrointestinal: Negative.   Musculoskeletal: Negative.   Skin: Negative.   Neurological: Negative.   Psychiatric/Behavioral: Positive for behavioral  problems, dysphoric mood and sleep disturbance. The patient is nervous/anxious.     Blood pressure 122/87, pulse 82, temperature 98.7 F (37.1 C), temperature source Oral, resp. rate 18, height 4\' 11"  (1.499 m), weight 68 kg, SpO2 99 %.Body mass index is 30.28 kg/m.  General Appearance: Casual  Eye Contact:  Good  Speech:  Pressured  Volume:  Increased  Mood:  Irritable  Affect:  Labile  Thought Process:  Disorganized  Orientation:  Full (Time, Place, and Person)  Thought Content:  Illogical  Suicidal Thoughts:  No  Homicidal Thoughts:  No  Memory:  Immediate;   Fair Recent;   Poor Remote;   Fair  Judgement:  Impaired  Insight:  Shallow  Psychomotor Activity:  Restlessness  Concentration:  Concentration: Fair  Recall:  [employer] of Knowledge:  Fair   Language:  Fair  Akathisia:  No  Handed:  Right  AIMS (if indicated):     Assets:  Desire for Improvement  ADL's:  Intact  Cognition:  WNL  Sleep:  Number of Hours: 5.5    Treatment Plan Summary: Plan History and current presentation suggest bipolar disorder.  Labs reviewed.  No obvious new medical problems.  No evidence of stimulant abuse although she does appear to regularly use marijuana which could be worsening the situation.  Patient will be taken off of fluoxetine as not appropriate in a manic episode and continued on Seroquel at the dose she quotes of 300 mg at night.  We will [facility] information.  Engage in individual and group therapy and work on safety plan prior to discharge  Observation Level/Precautions:  15 minute checks  Laboratory:  HbAIC  Psychotherapy:    Medications:    Consultations:    Discharge Concerns:    Estimated LOS:  Other:     Physician Treatment Plan for Primary Diagnosis: Bipolar affective ([place]) Long Term Goal(s): Improvement in symptoms so as ready for discharge  Short Term Goals: Ability to verbalize feelings will improve and Ability to demonstrate self-control will improve  Physician Treatment Plan for Secondary Diagnosis: Principal Problem:   Bipolar affective ([place]) Active Problems:   Human immunodeficiency virus (HIV) disease ([place])   Hepatitis C virus infection without hepatic coma   Hypertension associated with diabetes ([place])  Long Term Goal(s): Improvement in symptoms so as ready for discharge  Short Term Goals: Ability to identify and develop effective coping behaviors will improve, Ability to maintain clinical measurements within normal limits will improve and Compliance with prescribed medications will improve  I certify that inpatient services furnished can reasonably be expected to improve the patient's condition.    Alethia Berthold, MD 4/23/202111:48 AM

## 2019-06-20 NOTE — Tx Team (Signed)
Initial Treatment Plan 06/20/2019 5:06 AM [name] [id number]    PATIENT STRESSORS: Health problems Medication change or noncompliance Traumatic event   PATIENT STRENGTHS: Motivation for treatment/growth Religious Affiliation Supportive family/friends   PATIENT IDENTIFIED PROBLEMS: Depression     Psychosis                  DISCHARGE CRITERIA:  Improved stabilization in mood, thinking, and/or behavior Motivation to continue treatment in a less acute level of care  PRELIMINARY DISCHARGE PLAN: Outpatient therapy  PATIENT/FAMILY INVOLVEMENT: This treatment plan has been presented to and reviewed with the patient, Ashley [name], have been given the opportunity to ask questions and make suggestions.  Harl Bowie, RN 06/20/2019, 5:06 AM

## 2019-06-20 NOTE — Plan of Care (Signed)
Patient is hyper verbal and hyper religious states " I have a light around me and I can see everyone with that."Denies SI,HI and AVH.Compliant with medications.Attended groups.Appetite and energy level good.Support and encouragement given.

## 2019-06-21 NOTE — Progress Notes (Signed)
F - Decrease delusional thoughts.   D - Ashley [name] ate breakfast, socialized, and remained visible in the milieu. She attended groups and was able to adequately advocate for herself. Ashley [name] continues to endorse delusional thoughts about being pregnant, but has displayed increased insight.  Her mood has been stable.  Moments of agitation redirectable.    A - Medications were provided as ordered, as well as emotional support and reality testing.  R - Continue with care.

## 2019-06-21 NOTE — [facility] Group Notes (Signed)
LCSW Group Therapy Note  06/21/2019 5:01 PM  Type of Therapy/Topic:  Group Therapy:  Balance in Life  Participation Level:  Minimal  Description of Group:    This group will address the concept of balance and how it feels and looks when one is unbalanced. Patients will be encouraged to process areas in their lives that are out of balance and identify reasons for remaining unbalanced. Facilitators will guide patients in utilizing problem-solving interventions to address and correct the stressor making their life unbalanced. Understanding and applying boundaries will be explored and addressed for obtaining and maintaining a balanced life. Patients will be encouraged to explore ways to assertively make their unbalanced needs known to significant others in their lives, using other group members and facilitator for support and feedback.  Therapeutic Goals: 1. Patient will identify two or more emotions or situations they have that consume much of in their lives. 2. Patient will identify signs/triggers that life has become out of balance:  3. Patient will identify two ways to set boundaries in order to achieve balance in their lives:  4. Patient will demonstrate ability to communicate their needs through discussion and/or role plays  Summary of Patient Progress:   The pt identified areas in life that were unbalanced. Pt provided examples of how they can improve areas on the wellness wheel that yields feelings of dissatisfaction. The patient was able to acknowledge the areas that are going well. Pt left the group early due to being upset about being told not to interrupt her peers while speaking.    Therapeutic Modalities:   Cognitive Behavioral Therapy Solution-Focused Therapy Assertiveness Training  Ashley [name] MSW, [place] Clinical Social Work 06/21/2019 5:01 PM

## 2019-06-21 NOTE — Progress Notes (Signed)
[facility] MD Progress Note  06/21/2019 1:03 PM Ashley [name]  MRN:  [id number]   Ashley [name] is a 54yo F with history of bipolar disorder, who was admitted for delusional thinking.  Patient seen.  Chart reviewed. Patient discussed with nursing: patient was irritable,angry and verbally aggressive towards staff, she was particularly upset with staff because she was told not to share belongings with her peers and to keep the six feet rule because of Covid she was not receptive,in addition patient was noted very loud and disruptive on the unit, posturing and banging her doors. Patient was complaint with medication.  Subjective: Patient reports "I don`t know how I feel. I think I am alright". She says she wants to go home. She says she has her own apartment and she has a payee "she is like a sister to me". Patient denies any suicidal or homicidal ideation. Denies any current hallucinations. She does  Not express any delusions, but on further questioning about the reason for this admission, she akcnowledged that she thought she was pregnant "I don`t know now. They run all tests and did not find anything". She complaints of some "acid reflux" today and she took a medicine for that. She reports no side effects from medications.    Principal Problem: Bipolar affective ([place]) Diagnosis: Principal Problem:   Bipolar affective ([place]) Active Problems:   Human immunodeficiency virus (HIV) disease ([place])   Hepatitis C virus infection without hepatic coma   Hypertension associated with diabetes ([place])  Total Time spent with patient: 15 minutes  Past Psychiatric History: see H&P  Past Medical History:  Past Medical History:  Diagnosis Date  . Ankle fracture   . Anxiety   . Asthma   . Collagen vascular disease ([place])   . COPD (chronic obstructive pulmonary disease) ([place])   . Depression   . Diabetes mellitus   . Elevated LFTs 01/13/2012  . Hepatitis C   . Hernia   . HIV (human immunodeficiency virus infection)  ([place])   . HIV (human immunodeficiency virus infection) ([place])   . Hypertension   . Hypothyroidism   . Pinched nerve   . Scoliosis   . TB (tuberculosis)     Past Surgical History:  Procedure Laterality Date  . BIOPSY  05/08/2018   Procedure: biopsy;  Surgeon: Rogene [place], MD;  Location: [facility] ENDO SU[facility];  Service: Endoscopy;;  proximal transverse colon polyp  . CESAREAN SECTION    . CHOLECYSTECTOMY    . COLONOSCOPY N/A 05/08/2018   Procedure: COLONOSCOPY;  Surgeon: Rogene [place], MD;  Location: [facility] END[facility] SU[facility];  Service: Endoscopy;  Laterality: N/A;  2:00  . HERNIA REPAIR    . INCISIONAL HERNIA REPAIR N/A 09/30/2018   Procedure: OPEN HERNIA REPAIR INCISIONAL WITH MESH;  Surgeon: Virl Cagey, MD;  Location: [facility] ORS;  Service: General;  Laterality: N/A;  . LIVER BIOPSY     Family History:  Family History  Problem Relation Age of Onset  . Diabetes Mother   . Hypertension Mother   . Stroke Mother        died 10/14/13 . Cancer Sister   . Cancer Maternal Aunt    Family Psychiatric  History: see H&P Social History:  Social History   Substance and Sexual Activity  Alcohol Use Not Currently  . Alcohol/week: 3.0 standard drinks  . Types: 3 Cans of beer per week     Social History   Substance and Sexual Activity  Drug Use Not Currently  .  Types: Marijuana, Cocaine    Social History   Socioeconomic History  . Marital status: Single    Spouse name: Not on file  . Number of children: 2  . Years of education: Not on file  . Highest education level: Not on file  Occupational History  . Occupation: Disability  Tobacco Use  . Smoking status: Current Every Day Smoker    Packs/day: 1.00    Years: 35.00    Pack years: 35.00    Types: Cigarettes  . Smokeless tobacco: Never Used  . Tobacco comment: 1 pack every 2-3 days  Substance and Sexual Activity  . Alcohol use: Not Currently    Alcohol/week: 3.0 standard drinks    Types: 3 Cans of beer per week  . Drug use: Not  Currently    Types: Marijuana, Cocaine  . Sexual activity: Not Currently    Birth control/protection: Condom, None    Comment: condoms offered, condoms and lube provided  Other Topics Concern  . Not on file  Social History Narrative   Pt lives in an apartment by herself.  Pt stated that she is married and that her husband is in a Corporate treasurer, and that husband is being released yesterday or today.  Asked several times who Pt's psychiatric provider is.   Social Determinants of Health   Financial Resource Strain:   . Difficulty of Paying Living Expenses:   Food Insecurity:   . Worried About Charity fundraiser in the Last Year:   . Arboriculturist in the Last Year:   Transportation Needs:   . Film/video editor (Medical):   Marland Kitchen Lack of Transportation (Non-Medical):   Physical Activity:   . Days of Exercise per Week:   . Minutes of Exercise per Session:   Stress:   . Feeling of Stress :   Social Connections:   . Frequency of Communication with Friends and Family:   . Frequency of Social Gatherings with Friends and Family:   . Attends Religious Services:   . Active Member of Clubs or Organizations:   . Attends Archivist Meetings:   Marland Kitchen Marital Status:    Additional Social History:                         Sleep: Good  Appetite:  Good  Current Medications: Current Facility-Administered Medications  Medication Dose Route Frequency Provider Last Rate Last Admin  . acetaminophen (TYLENOL) tablet 650 mg  650 mg Oral Q6H PRN Money, Lowry Ram, FNP   650 mg at 06/20/19 1224  . albuterol (PROVENTIL) (2.5 MG/3ML) 0.083% nebulizer solution 3 mL  3 mL Inhalation Q6H PRN Money, Lowry Ram, FNP   3 mL at 06/20/19 0727  . alum & mag hydroxide-simeth (MAALOX/MYLANTA) 200-200-20 MG/5ML suspension 30 mL  30 mL Oral Q4H PRN Money, Darnelle Maffucci B, FNP   30 mL at 06/21/19 1247  . amLODipine (NORVASC) tablet 10 mg  10 mg Oral Daily Money, Lowry Ram, FNP   10 mg at 06/21/19 [id number]   . bictegravir-emtricitabine-tenofovir AF (BIKTARVY) 50-200-25 MG per tablet 1 tablet  1 tablet Oral Daily Money, Lowry Ram, FNP   1 tablet at 06/21/19 0813  . fluticasone (FLONASE) 50 MCG/ACT nasal spray 2 spray  2 spray Each Nare Daily Money, Lowry Ram, FNP   2 spray at 06/21/19 0813  . hydrOXYzine (ATARAX/VISTARIL) tablet 25 mg  25 mg Oral TID PRN Money, Lowry Ram, FNP   25  mg at 06/20/19 2126  . loratadine (CLARITIN) tablet 10 mg  10 mg Oral Daily Money, Lowry Ram, FNP   10 mg at 06/21/19 [id number]  . magnesium hydroxide (MILK OF MAGNESIA) suspension 30 mL  30 mL Oral Daily PRN Money, Lowry Ram, FNP      . QUEtiapine (SEROQUEL) tablet 300 mg  300 mg Oral QHS Clapacs, John T, MD   300 mg at 06/20/19 2126  . traZODone (DESYREL) tablet 50 mg  50 mg Oral QHS PRN Money, Lowry Ram, FNP   50 mg at 06/20/19 2126    Lab Results:  Results for orders placed or performed during the hospital encounter of 06/19/19 (from the past 48 hour(s))  Hemoglobin A1c     Status: None   Collection Time: 06/20/19  2:22 PM  Result Value Ref Range   Hgb A1c MFr Bld 5.5 4.8 - 5.6 %    Comment: (NOTE) Pre diabetes:          5.7%-6.4% Diabetes:              >6.4% Glycemic control for   <7.0% adults with diabetes    Mean Plasma Glucose 111.15 mg/dL    Comment: Performed at [facility], [place] [address] [place], [place] [address]  TSH     Status: None   Collection Time: 06/20/19  2:22 PM  Result Value Ref Range   TSH 0.700 0.350 - 4.500 uIU/mL    Comment: Performed by a 3rd Generation assay with a functional sensitivity of <=0.01 uIU/mL. Performed at [facility], [address] [place], [place] [address]     Blood Alcohol level:  Lab Results  Component Value Date   [facility] <10 06/18/2019   ETH <5 [id number]    Metabolic Disorder Labs: Lab Results  Component Value Date   HGBA1C 5.5 06/20/2019   MPG 111.15 06/20/2019   MPG 108.28 09/27/2018   No results found for: PROLACTIN Lab Results  Component  Value Date   CHOL 165 08/08/2017   TRIG 107 08/08/2017   HDL 66 08/08/2017   CHOLHDL 2.5 08/08/2017   VLDL 16 06/24/2015   LDLCALC 78 08/08/2017   LDLCALC 75 01/24/2016    Physical Findings: AIMS:  , ,  ,  ,    CIWA:    COWS:     Musculoskeletal: Strength & Muscle Tone: within normal limits Gait & Station: normal Patient leans: N/A  Psychiatric Specialty Exam: Physical Exam  Review of Systems  Blood pressure 114/86, pulse 76, temperature 97.6 F (36.4 C), temperature source Oral, resp. rate 18, height 4\' 11"  (1.499 m), weight 68 kg, SpO2 93 %.Body mass index is 30.28 kg/m.  General Appearance: Casual  Eye Contact:  Good  Speech:  Pressured  Volume:  Normal  Mood:  Euthymic  Affect:  Congruent  Thought Process:  Coherent  Orientation:  Full (Time, Place, and Person)  Thought Content:  Illogical  Suicidal Thoughts:  No  Homicidal Thoughts:  No  Memory:  Immediate;   Fair Recent;   Fair Remote;   Fair  Judgement:  Impaired  Insight:  Shallow  Psychomotor Activity:  Increased  Concentration:  Concentration: Fair and Attention Span: Fair  Recall:  [employer] of Knowledge:  Fair  Language:  Fair  Akathisia:  No  Handed:  Right  AIMS (if indicated):     Assets:  Desire for Improvement  ADL's:  Intact  Cognition:  WNL  Sleep:  Number of Hours:  7.15     Treatment Plan Summary: Daily contact with patient to assess and evaluate symptoms and progress in treatment and Medication management  Patient still expresses occasional irritability and agitation, but redirectable and less delusional. Will continue current dose of Seroquel 300mg  QHS for mood stabilization without changes today. Continue inpatient psych admission; 15-minute checks; daily contact with patient to assess and evaluate symptoms and progress in treatment; psychoeducation.      Larita Fife, MD 06/21/2019, 1:03 PM

## 2019-06-21 NOTE — Progress Notes (Signed)
Patient alert and oriented x 4, affect is blunted, her mood is irritable,angry and verbally aggressive towards staff, she was particularly upset with staff because she was told not to share belongings with her peers and to keep the six feet rule because of Covid she was not receptive,in addition patient was noted very loud and  disruptive on the unit. Patient was angry, posturing and banging her doors. Patient was complaint with medication, 15 minutes safety checks was maintained will continue to monitor. Marland Kitchen

## 2019-06-21 NOTE — Plan of Care (Signed)
The patient was pleasant and cooperative with the Probation officer.  Problem: Education: Goal: Mental status will improve Outcome: Progressing Goal: Verbalization of understanding the information provided will improve Outcome: Progressing

## 2019-06-22 NOTE — Tx Team (Signed)
Interdisciplinary Treatment and Diagnostic Plan Update  06/22/2019 Time of Session: 1100am Ashley [name] MRN: [id number]  Principal Diagnosis: Bipolar affective [facility])  Secondary Diagnoses: Principal Problem:   Bipolar affective ([place]) Active Problems:   Human immunodeficiency virus (HIV) disease ([place])   Hepatitis C virus infection without hepatic coma   Hypertension associated with diabetes ([place])   Current Medications:  Current Facility-Administered Medications  Medication Dose Route Frequency Provider Last Rate Last Admin  . acetaminophen (TYLENOL) tablet 650 mg  650 mg Oral Q6H PRN Money, Lowry Ram, FNP   650 mg at 06/22/19 0028  . albuterol (PROVENTIL) (2.5 MG/3ML) 0.083% nebulizer solution 3 mL  3 mL Inhalation Q6H PRN Money, Lowry Ram, FNP   3 mL at 06/20/19 0727  . alum & mag hydroxide-simeth (MAALOX/MYLANTA) 200-200-20 MG/5ML suspension 30 mL  30 mL Oral Q4H PRN Money, Darnelle Maffucci B, FNP   30 mL at 06/22/19 1300  . amLODipine (NORVASC) tablet 10 mg  10 mg Oral Daily Money, Lowry Ram, FNP   10 mg at 06/22/19 0810  . bictegravir-emtricitabine-tenofovir AF (BIKTARVY) 50-200-25 MG per tablet 1 tablet  1 tablet Oral Daily Money, Lowry Ram, FNP   1 tablet at 06/22/19 [id number]  . fluticasone (FLONASE) 50 MCG/ACT nasal spray 2 spray  2 spray Each Nare Daily Money, Lowry Ram, FNP   2 spray at 06/22/19 [id number]  . hydrOXYzine (ATARAX/VISTARIL) tablet 25 mg  25 mg Oral TID PRN Money, Lowry Ram, FNP   25 mg at 06/20/19 2126  . loratadine (CLARITIN) tablet 10 mg  10 mg Oral Daily Money, Lowry Ram, FNP   10 mg at 06/22/19 0810  . magnesium hydroxide (MILK OF MAGNESIA) suspension 30 mL  30 mL Oral Daily PRN Money, Lowry Ram, FNP      . QUEtiapine (SEROQUEL) tablet 300 mg  300 mg Oral QHS Clapacs, John T, MD   300 mg at 06/21/19 2128  . traZODone (DESYREL) tablet 50 mg  50 mg Oral QHS PRN Money, Lowry Ram, FNP   50 mg at 06/20/19 2126   PTA Medications: Medications Prior to Admission  Medication Sig Dispense Refill  Last Dose  . albuterol (VENTOLIN HFA) 108 (90 Base) MCG/ACT inhaler Inhale 2 puffs into the lungs every 6 (six) hours as needed for wheezing or shortness of breath. 18 g 1   . amLODipine (NORVASC) 10 MG tablet Take 1 tablet (10 mg total) by mouth at bedtime. 90 tablet 3   . BIKTARVY 50-200-25 MG TABS tablet TAKE ONE TABLET BY MOUTH EVERY DAY 30 tablet 0   . cetirizine (ZYRTEC) 10 MG tablet Take 1 tablet (10 mg total) by mouth daily. 90 tablet 3   . diclofenac Sodium (VOLTAREN) 1 % GEL Apply 2 g topically 4 (four) times daily. 200 g 2   . docusate sodium (COLACE) 100 MG capsule Take 1 capsule (100 mg total) by mouth 2 (two) times daily. 60 capsule 2   . FLUoxetine (PROZAC) 20 MG capsule Take 1 capsule (20 mg total) by mouth daily. 90 capsule 3   . fluticasone (FLONASE) 50 MCG/ACT nasal spray Place 2 sprays into both nostrils daily. 48 g 3   . megestrol (MEGACE ES) 625 MG/5ML suspension Take 5 mLs (625 mg total) by mouth daily. 150 mL 6   . methocarbamol (ROBAXIN) 500 MG tablet TAKE ONE TABLET BY MOUTH EVERY 8 HOURS AS NEEDED FOR MUSCLE SPASMS 30 tablet 0   . Multiple Vitamin (MULTI-VITAMINS) TABS Take 1 tablet by mouth daily.  90 tablet 1   . Multiple Vitamins-Minerals (TAB-A-VITE) TABS TAKE ONE TABLET BY MOUTH DAILY 90 tablet 0   . Omega-3 Fatty Acids (FISH OIL) 1000 MG CAPS Take 1,000 mg by mouth at bedtime.     . pantoprazole (PROTONIX) 40 MG tablet Take 1 tablet (40 mg total) by mouth at bedtime. 90 tablet 3   . Polyethylene Glycol 3350 (PEG 3350) 17 g PACK Mix 1 packet in 8 ounces of fluid and drink daily IF needed for constipation. 30 each prn   . predniSONE (STERAPRED UNI-PAK 21 TAB) 10 MG (21) TBPK tablet As directed x 6 days 21 tablet 0   . Psyllium 48.57 % POWD Take 4 g by mouth at bedtime. (Patient taking differently: Take 1 packet by mouth daily as needed (for constipation). )  0   . senna (SENOKOT) 8.6 MG TABS tablet TAKE ONE TABLET BY MOUTH AT BEDTIME AS NEEDED FOR CONSTIPATION 30  tablet 0   . vitamin B-12 (CYANOCOBALAMIN) 50 MCG tablet Take 50 mcg by mouth at bedtime.     . vitamin C (ASCORBIC ACID) 500 MG tablet Take 500 mg by mouth at bedtime.       Patient Stressors: Health problems Medication change or noncompliance Traumatic event  Patient Strengths: Motivation for treatment/growth Religious Affiliation Supportive family/friends  Treatment Modalities: Medication Management, Group therapy, Case management,  1 to 1 session with clinician, Psychoeducation, Recreational therapy.   Physician Treatment Plan for Primary Diagnosis: Bipolar affective ([place]) Long Term Goal(s): Improvement in symptoms so as ready for discharge Improvement in symptoms so as ready for discharge   Short Term Goals: Ability to verbalize feelings will improve Ability to demonstrate self-control will improve Ability to identify and develop effective coping behaviors will improve Ability to maintain clinical measurements within normal limits will improve Compliance with prescribed medications will improve  Medication Management: Evaluate patient's response, side effects, and tolerance of medication regimen.  Therapeutic Interventions: 1 to 1 sessions, Unit Group sessions and Medication administration.  Evaluation of Outcomes: Progressing  Physician Treatment Plan for Secondary Diagnosis: Principal Problem:   Bipolar affective ([place]) Active Problems:   Human immunodeficiency virus (HIV) disease ([place])   Hepatitis C virus infection without hepatic coma   Hypertension associated with diabetes ([place])  Long Term Goal(s): Improvement in symptoms so as ready for discharge Improvement in symptoms so as ready for discharge   Short Term Goals: Ability to verbalize feelings will improve Ability to demonstrate self-control will improve Ability to identify and develop effective coping behaviors will improve Ability to maintain clinical measurements within normal limits will  improve Compliance with prescribed medications will improve     Medication Management: Evaluate patient's response, side effects, and tolerance of medication regimen.  Therapeutic Interventions: 1 to 1 sessions, Unit Group sessions and Medication administration.  Evaluation of Outcomes: Progressing   RN Treatment Plan for Primary Diagnosis: Bipolar affective ([place]) Long Term Goal(s): Knowledge of disease and therapeutic regimen to maintain health will improve  Short Term Goals: Ability to remain free from injury will improve, Ability to verbalize frustration and anger appropriately will improve, Ability to demonstrate self-control, Ability to participate in decision making will improve, Ability to verbalize feelings will improve, Ability to identify and develop effective coping behaviors will improve and Compliance with prescribed medications will improve  Medication Management: RN will administer medications as ordered by provider, will assess and evaluate patient's response and provide education to patient for prescribed medication. RN will report any adverse and/or side effects  to prescribing provider.  Therapeutic Interventions: 1 on 1 counseling sessions, Psychoeducation, Medication administration, Evaluate responses to treatment, Monitor vital signs and CBGs as ordered, Perform/monitor CIWA, COWS, AIMS and Fall Risk screenings as ordered, Perform wound care treatments as ordered.  Evaluation of Outcomes: Progressing   LCSW Treatment Plan for Primary Diagnosis: Bipolar affective ([place]) Long Term Goal(s): Safe transition to appropriate next level of care at discharge, Engage patient in therapeutic group addressing interpersonal concerns.  Short Term Goals: Engage patient in aftercare planning with referrals and resources, Increase social support, Increase ability to appropriately verbalize feelings, Increase emotional regulation, Identify triggers associated with mental health/substance  abuse issues and Increase skills for wellness and recovery  Therapeutic Interventions: Assess for all discharge needs, 1 to 1 time with Social worker, Explore available resources and support systems, Assess for adequacy in community support network, Educate family and significant other(s) on suicide prevention, Complete Psychosocial Assessment, Interpersonal group therapy.  Evaluation of Outcomes: Progressing   Progress in Treatment: Attending groups: Yes. Participating in groups: Yes. Taking medication as prescribed: Yes. Toleration medication: Yes. Family/Significant other contact made: No, will contact:  son Patient understands diagnosis: Yes. Discussing patient identified problems/goals with staff: Yes. Medical problems stabilized or resolved: Yes. Denies suicidal/homicidal ideation: Yes. Issues/concerns per patient self-inventory: No. Other: none  New problem(s) identified: No, Describe:  none  New Short Term/Long Term Goal(s):  Patient Goals:  "I want to work on my attitude"  Discharge Plan or Barriers: Pt will receive follow up care with [facility].  Reason for Continuation of Hospitalization: Aggression Suicidal ideation  Estimated Length of Stay: 3-5 days  Attendees: Patient: Ashley [name] 06/22/2019   Physician: Dr. Larita Fife, MD 06/22/2019   Nursing: Lyda Kalata, RN 06/22/2019   RN Care Manager: 06/22/2019   Social Worker: [place], [place] 06/22/2019   Recreational Therapist:  06/22/2019   Other:  06/22/2019  Other:  06/22/2019   Other: 06/22/2019    Scribe for Treatment Team: Charlott Rakes, Latanya Presser 06/22/2019 3:33 PM

## 2019-06-22 NOTE — Progress Notes (Signed)
[facility] MD Progress Note  06/22/2019 11:49 AM Ashley [name]  MRN:  [id number]   Ashley [name] is a 54yo F with history of bipolar disorder, who was admitted for delusional thinking.  Patient seen.  Chart reviewed. Patient discussed with nursing, no overnight events reported, patient was calm and pleasant.  Subjective: Patient reports "I feel good". She reports she feels calm and slept well. She reports that her goal during this admission is "to keep my attitude": and states she thinks it is going in right direction "I am calm", "I will maintain my medicine at home", "I would like outpatient therapy." She denies any suicidal or homicidal ideation. Denies any current hallucinations. She does not express any delusions and thinks "my stomach issues could be related to hernia. I need to see a GI after discharge." She reports no side effects from medications.    Principal Problem: Bipolar affective ([place]) Diagnosis: Principal Problem:   Bipolar affective ([place]) Active Problems:   Human immunodeficiency virus (HIV) disease ([place])   Hepatitis C virus infection without hepatic coma   Hypertension associated with diabetes ([place])  Total Time spent with patient: 15 minutes  Past Psychiatric History: see H&P  Past Medical History:  Past Medical History:  Diagnosis Date  . Ankle fracture   . Anxiety   . Asthma   . Collagen vascular disease ([place])   . COPD (chronic obstructive pulmonary disease) ([place])   . Depression   . Diabetes mellitus   . Elevated LFTs 01/13/2012  . Hepatitis C   . Hernia   . HIV (human immunodeficiency virus infection) ([place])   . HIV (human immunodeficiency virus infection) ([place])   . Hypertension   . Hypothyroidism   . Pinched nerve   . Scoliosis   . TB (tuberculosis)     Past Surgical History:  Procedure Laterality Date  . BIOPSY  05/08/2018   Procedure: biopsy;  Surgeon: Rogene Houston, MD;  Location: [facility];  Service: Endoscopy;;  proximal transverse colon polyp   . CESAREAN SECTION    . CHOLECYSTECTOMY    . COLONOSCOPY N/A 05/08/2018   Procedure: COLONOSCOPY;  Surgeon: Rogene Houston, MD;  Location: [facility];  Service: Endoscopy;  Laterality: N/A;  2:00  . HERNIA REPAIR    . INCISIONAL HERNIA REPAIR N/A 09/30/2018   Procedure: OPEN HERNIA REPAIR INCISIONAL WITH MESH;  Surgeon: Virl Cagey, MD;  Location: [facility] ORS;  Service: General;  Laterality: N/A;  . LIVER BIOPSY     Family History:  Family History  Problem Relation Age of Onset  . Diabetes Mother   . Hypertension Mother   . Stroke Mother        died 2013/09/25 . Cancer Sister   . Cancer Maternal Aunt    Family Psychiatric  History: see H&P Social History:  Social History   Substance and Sexual Activity  Alcohol Use Not Currently  . Alcohol/week: 3.0 standard drinks  . Types: 3 Cans of beer per week     Social History   Substance and Sexual Activity  Drug Use Not Currently  . Types: Marijuana, Cocaine    Social History   Socioeconomic History  . Marital status: Single    Spouse name: Not on file  . Number of children: 2  . Years of education: Not on file  . Highest education level: Not on file  Occupational History  . Occupation: Disability  Tobacco Use  . Smoking status: Current Every Day Smoker  Packs/day: 1.00    Years: 35.00    Pack years: 35.00    Types: Cigarettes  . Smokeless tobacco: Never Used  . Tobacco comment: 1 pack every 2-3 days  Substance and Sexual Activity  . Alcohol use: Not Currently    Alcohol/week: 3.0 standard drinks    Types: 3 Cans of beer per week  . Drug use: Not Currently    Types: Marijuana, Cocaine  . Sexual activity: Not Currently    Birth control/protection: Condom, None    Comment: condoms offered, condoms and lube provided  Other Topics Concern  . Not on file  Social History Narrative   Pt lives in an apartment by herself.  Pt stated that she is married and that her husband is in a Corporate treasurer, and that  husband is being released yesterday or today.  Asked several times who Pt's psychiatric provider is.   Social Determinants of Health   Financial Resource Strain:   . Difficulty of Paying Living Expenses:   Food Insecurity:   . Worried About Charity fundraiser in the Last Year:   . Arboriculturist in the Last Year:   Transportation Needs:   . Film/video editor (Medical):   Marland Kitchen Lack of Transportation (Non-Medical):   Physical Activity:   . Days of Exercise per Week:   . Minutes of Exercise per Session:   Stress:   . Feeling of Stress :   Social Connections:   . Frequency of Communication with Friends and Family:   . Frequency of Social Gatherings with Friends and Family:   . Attends Religious Services:   . Active Member of Clubs or Organizations:   . Attends Archivist Meetings:   Marland Kitchen Marital Status:    Additional Social History:                         Sleep: Good  Appetite:  Good  Current Medications: Current Facility-Administered Medications  Medication Dose Route Frequency Provider Last Rate Last Admin  . acetaminophen (TYLENOL) tablet 650 mg  650 mg Oral Q6H PRN Money, Lowry Ram, FNP   650 mg at 06/22/19 0028  . albuterol (PROVENTIL) (2.5 MG/3ML) 0.083% nebulizer solution 3 mL  3 mL Inhalation Q6H PRN Money, Lowry Ram, FNP   3 mL at 06/20/19 0727  . alum & mag hydroxide-simeth (MAALOX/MYLANTA) 200-200-20 MG/5ML suspension 30 mL  30 mL Oral Q4H PRN Money, Lowry Ram, FNP   30 mL at 06/21/19 1809  . amLODipine (NORVASC) tablet 10 mg  10 mg Oral Daily Money, Lowry Ram, FNP   10 mg at 06/22/19 0810  . bictegravir-emtricitabine-tenofovir AF (BIKTARVY) 50-200-25 MG per tablet 1 tablet  1 tablet Oral Daily Money, Lowry Ram, FNP   1 tablet at 06/22/19 [id number]  . fluticasone (FLONASE) 50 MCG/ACT nasal spray 2 spray  2 spray Each Nare Daily Money, Lowry Ram, FNP   2 spray at 06/22/19 [id number]  . hydrOXYzine (ATARAX/VISTARIL) tablet 25 mg  25 mg Oral TID PRN Money, Lowry Ram,  FNP   25 mg at 06/20/19 2126  . loratadine (CLARITIN) tablet 10 mg  10 mg Oral Daily Money, Lowry Ram, FNP   10 mg at 06/22/19 0810  . magnesium hydroxide (MILK OF MAGNESIA) suspension 30 mL  30 mL Oral Daily PRN Money, Lowry Ram, FNP      . QUEtiapine (SEROQUEL) tablet 300 mg  300 mg Oral QHS Clapacs, Madie Reno, MD  300 mg at 06/21/19 2128  . traZODone (DESYREL) tablet 50 mg  50 mg Oral QHS PRN Money, Lowry Ram, FNP   50 mg at 06/20/19 2126    Lab Results:  Results for orders placed or performed during the hospital encounter of 06/19/19 (from the past 48 hour(s))  Hemoglobin A1c     Status: None   Collection Time: 06/20/19  2:22 PM  Result Value Ref Range   Hgb A1c MFr Bld 5.5 4.8 - 5.6 %    Comment: (NOTE) Pre diabetes:          5.7%-6.4% Diabetes:              >6.4% Glycemic control for   <7.0% adults with diabetes    Mean Plasma Glucose 111.15 mg/dL    Comment: Performed at [facility], [place] [address] [place], [place] [address]  TSH     Status: None   Collection Time: 06/20/19  2:22 PM  Result Value Ref Range   TSH 0.700 0.350 - 4.500 uIU/mL    Comment: Performed by a 3rd Generation assay with a functional sensitivity of <=0.01 uIU/mL. Performed at [facility], [address] [place], [place] [address]     Blood Alcohol level:  Lab Results  Component Value Date   [facility] <10 06/18/2019   ETH <5 [id number]    Metabolic Disorder Labs: Lab Results  Component Value Date   HGBA1C 5.5 06/20/2019   MPG 111.15 06/20/2019   MPG 108.28 09/27/2018   No results found for: PROLACTIN Lab Results  Component Value Date   CHOL 165 08/08/2017   TRIG 107 08/08/2017   HDL 66 08/08/2017   CHOLHDL 2.5 08/08/2017   VLDL 16 06/24/2015   LDLCALC 78 08/08/2017   LDLCALC 75 01/24/2016    Physical Findings: AIMS:  , ,  ,  ,    CIWA:    COWS:     Musculoskeletal: Strength & Muscle Tone: within normal limits Gait & Station: normal Patient leans: N/A  Psychiatric  Specialty Exam: Physical Exam   Review of Systems   Blood pressure 109/74, pulse 68, temperature 97.8 F (36.6 C), temperature source Oral, resp. rate (!) 8, height 4\' 11"  (1.499 m), weight 68 kg, SpO2 100 %.Body mass index is 30.28 kg/m.  General Appearance: Casual  Eye Contact:  Good  Speech:  Pressured  Volume:  Normal  Mood:  Euthymic  Affect:  Congruent  Thought Process:  Coherent  Orientation:  Full (Time, Place, and Person)  Thought Content:  No delusions  Suicidal Thoughts:  No  Homicidal Thoughts:  No  Memory:  Immediate;   Fair Recent;   Fair Remote;   Fair  Judgement:  improving  Insight: limited, but improving  Psychomotor Activity: wnl  Concentration:  Concentration: Fair and Attention Span: Fair  Recall:  [employer] of Knowledge:  Fair  Language:  Fair  Akathisia:  No  Handed:  Right  AIMS (if indicated):     Assets:  Desire for Improvement  ADL's:  Intact  Cognition:  WNL  Sleep:  Number of Hours: 7.5     Treatment Plan Summary: Daily contact with patient to assess and evaluate symptoms and progress in treatment and Medication management  Patient appears calm, no irritability or agitation. Will continue current dose of Seroquel 300mg  QHS for mood stabilization without changes. Continue inpatient psych admission; 15-minute checks; daily contact with patient to assess and evaluate symptoms and progress in treatment; psychoeducation.  Patient likely will be ready for discharge early next week.      Larita Fife, MD 06/22/2019, 11:49 AM

## 2019-06-22 NOTE — [facility] Group Notes (Signed)
LCSW Group Therapy Notes  Date and Time: 06/22/19 1:00PM  Type of Therapy and Topic: Group Therapy: Healthy Vs. Unhealthy Coping Strategies  Participation Level: [facility] PARTICIPATION LEVEL: Active  Description of Group:  In this group, patients will be encouraged to explore their healthy and unhealthy coping strategics. Coping strategies are actions that we take to deal with stress, problems, or uncomfortable emotions in our daily lives. Each patient will be challenged to read some scenarios and discuss the unhealthy and healthy coping strategies within those scenarios. Also, each patient will be challenged to describe current healthy and unhealthy strategies that they use in their own lives and discuss the outcomes and barriers to those strategies. This group will be process-oriented, with patients participating in exploration of their own experiences as well as giving and receiving support and challenge from other group members.  Therapeutic Goals: 1. Patient will identify personal healthy and unhealthy coping strategies. 2. Patient will identify healthy and unhealthy coping strategies, in others, through scenarios.  3. Patient will identify expected outcomes of healthy and unhealthy coping strategies. 4. Patient will identify barriers to using healthy coping strategies.   Summary of Patient Progress:  The patient stated that current healthy supports in her life are religious beliefs and writing while current unhealthy supports include agression.  The patient expressed a willingness to add walking away as support(s) to help in her recovery journey.   Therapeutic Modalities:  Cognitive Behavioral Therapy Solution Focused Therapy Motivational Interviewing   Juanetta Beets, MSW, [place] Social Worker

## 2019-06-22 NOTE — Plan of Care (Signed)
Cooperative with treatment and pleasant on approach. She was visible in the dayroom during the evening and she interacted well with peers and staff .She appears to be in bed resting quietly at this time.

## 2019-06-22 NOTE — Progress Notes (Signed)
F - Stabilize mood.  D - Ashley [name] joined others for breakfast and remained visible in the milieu for the majority of the day. She denied suicidal ideation and presented with an elevated mood.  Ashley [name] was energetic, detail oriented, and active.  On the PSI-A, Ashley [name] rated her depression as a 3 and her anxiety as a 3.  She was able to manage her anger and did not display any episodes of agitation. Medications were accepted as ordered and the patient denied problematic side effects (but did endorse a headache). Overall functioning and control of psychiatric issues are improved.  Somatic complaints decreased when compared to the prior day.   A - The patient was provided support and encouragement. No significant issues to note.    R - Continue with care and monitor for hypomania.

## 2019-06-22 NOTE — Plan of Care (Signed)
Patient is pleasant and cooperative.  Problem: Education: Goal: Mental status will improve Outcome: Progressing   Problem: Safety: Goal: Ability to redirect hostility and anger into socially appropriate behaviors will improve Outcome: Progressing

## 2019-06-23 MED ORDER — PANTOPRAZOLE SODIUM 40 MG PO TBEC: 40.0000 mg | DELAYED_RELEASE_TABLET | Freq: Every day | ORAL | 1 refills | Status: DC

## 2019-06-23 MED ORDER — DOCUSATE SODIUM 100 MG PO CAPS: 100.0000 mg | ORAL_CAPSULE | Freq: Two times a day (BID) | ORAL | 2 refills | Status: AC

## 2019-06-23 MED ORDER — ALBUTEROL SULFATE HFA 108 (90 BASE) MCG/ACT IN AERS: 2.0000 | INHALATION_SPRAY | Freq: Four times a day (QID) | RESPIRATORY_TRACT | 1 refills | Status: DC | PRN

## 2019-06-23 MED ORDER — PANTOPRAZOLE SODIUM 40 MG PO TBEC
40.0000 mg | DELAYED_RELEASE_TABLET | Freq: Every day | ORAL | Status: DC
  Administered 2019-06-23 – 2019-06-26 (×4): 40 mg via ORAL
  Filled 2019-06-23 (×4): qty 1

## 2019-06-23 MED ORDER — LORATADINE 10 MG PO TABS: 10.0000 mg | ORAL_TABLET | Freq: Every day | ORAL | 1 refills | Status: DC

## 2019-06-23 MED ORDER — AMLODIPINE BESYLATE 10 MG PO TABS: 10.0000 mg | ORAL_TABLET | Freq: Every day | ORAL | 1 refills | Status: DC

## 2019-06-23 MED ORDER — QUETIAPINE FUMARATE 300 MG PO TABS: 300.0000 mg | ORAL_TABLET | Freq: Every day | ORAL | 1 refills | Status: DC

## 2019-06-23 MED ORDER — BICTEGRAVIR-EMTRICITAB-TENOFOV 50-200-25 MG PO TABS: 1.0000 | ORAL_TABLET | Freq: Every day | ORAL | 1 refills | Status: DC

## 2019-06-23 NOTE — [facility] Group Notes (Signed)
LCSW Group Therapy Note   06/23/2019 1:00 PM  Type of Therapy and Topic:  Group Therapy:  Overcoming Obstacles   Participation Level:  None   Description of Group:    In this group patients will be encouraged to explore what they see as obstacles to their own wellness and recovery. They will be guided to discuss their thoughts, feelings, and behaviors related to these obstacles. The group will process together ways to cope with barriers, with attention given to specific choices patients can make. Each patient will be challenged to identify changes they are motivated to make in order to overcome their obstacles. This group will be process-oriented, with patients participating in exploration of their own experiences as well as giving and receiving support and challenge from other group members.   Therapeutic Goals: 1. Patient will identify personal and current obstacles as they relate to admission. 2. Patient will identify barriers that currently interfere with their wellness or overcoming obstacles.  3. Patient will identify feelings, thought process and behaviors related to these barriers. 4. Patient will identify two changes they are willing to make to overcome these obstacles:      Summary of Patient Progress Patient was present at the beginning of group, however, left upset when CSW asked for patient to put on a mask.    Therapeutic Modalities:   Cognitive Behavioral Therapy Solution Focused Therapy Motivational Interviewing Relapse Prevention Therapy  Assunta Curtis, MSW, LCSW 06/23/2019 12:22 PM

## 2019-06-23 NOTE — Discharge Summary (Signed)
Physician Discharge Summary Note  Patient:  Ashley [name] is an 54 y.o., female MRN:  [id number] DOB:  02-15-1966 Patient phone:  [phone] (home)  Patient address:   [address] [place] [address],  Total Time spent with patient: 30 minutes  Date of Admission:  06/19/2019 Date of Discharge: June 23, 2019  Reason for Admission: Admitted because of concern about continued psychotic symptoms specifically about being pregnant as well as some disorganized behavior  Principal Problem: Bipolar affective [facility]) Discharge Diagnoses: Principal Problem:   Bipolar affective ([place]) Active Problems:   Human immunodeficiency virus (HIV) disease ([place])   Hepatitis C virus infection without hepatic coma   Hypertension associated with diabetes ([place])   Past Psychiatric History: History of chronic psychotic disorder [facility]  Past Medical History:  Past Medical History:  Diagnosis Date  . Ankle fracture   . Anxiety   . Asthma   . Collagen vascular disease ([place])   . COPD (chronic obstructive pulmonary disease) ([place])   . Depression   . Diabetes mellitus   . Elevated LFTs 01/13/2012  . Hepatitis C   . Hernia   . HIV (human immunodeficiency virus infection) ([place])   . HIV (human immunodeficiency virus infection) ([place])   . Hypertension   . Hypothyroidism   . Pinched nerve   . Scoliosis   . TB (tuberculosis)     Past Surgical History:  Procedure Laterality Date  . BIOPSY  05/08/2018   Procedure: biopsy;  Surgeon: Rogene Houston, MD;  [facility];  Service: Endoscopy;;  proximal transverse colon polyp  . CESAREAN SECTION    . CHOLECYSTECTOMY    . COLONOSCOPY N/A 05/08/2018   Procedure: COLONOSCOPY;  Surgeon: Rogene Houston, MD;  Location: [facility];  Service: Endoscopy;  Laterality: N/A;  2:00  . HERNIA REPAIR    . INCISIONAL HERNIA REPAIR N/A 09/30/2018   Procedure: OPEN HERNIA REPAIR INCISIONAL WITH MESH;  Surgeon:  Virl Cagey, MD;  Location: [facility] ORS;  Service: General;  Laterality: N/A;  . LIVER BIOPSY     Family History:  Family History  Problem Relation Age of Onset  . Diabetes Mother   . Hypertension Mother   . Stroke Mother        died 2013-10-08 . Cancer Sister   . Cancer Maternal Aunt    Family Psychiatric  History: See previous Social History:  Social History   Substance and Sexual Activity  Alcohol Use Not Currently  . Alcohol/week: 3.0 standard drinks  . Types: 3 Cans of beer per week     Social History   Substance and Sexual Activity  Drug Use Not Currently  . Types: Marijuana, Cocaine    Social History   Socioeconomic History  . Marital status: Single    Spouse name: Not on file  . Number of children: 2  . Years of education: Not on file  . Highest education level: Not on file  Occupational History  . Occupation: Disability  Tobacco Use  . Smoking status: Current Every Day Smoker    Packs/day: 1.00    Years: 35.00    Pack years: 35.00    Types: Cigarettes  . Smokeless tobacco: Never Used  . Tobacco comment: 1 pack every 2-3 days  Substance and Sexual Activity  . Alcohol use: Not Currently    Alcohol/week: 3.0 standard drinks    Types: 3 Cans of beer per week  .  Drug use: Not Currently    Types: Marijuana, Cocaine  . Sexual activity: Not Currently    Birth control/protection: Condom, None    Comment: condoms offered, condoms and lube provided  Other Topics Concern  . Not on file  Social History Narrative   Pt lives in an apartment by herself.  Pt stated that she is married and that her husband is in a Corporate treasurer, and that husband is being released yesterday or today.  Asked several times who Pt's psychiatric provider is.   Social Determinants of Health   Financial Resource Strain:   . Difficulty of Paying Living Expenses:   Food Insecurity:   . Worried About Charity fundraiser in the Last Year:   . Arboriculturist in the Last Year:    Transportation Needs:   . Film/video editor (Medical):   Marland Kitchen Lack of Transportation (Non-Medical):   Physical Activity:   . Days of Exercise per Week:   . Minutes of Exercise per Session:   Stress:   . Feeling of Stress :   Social Connections:   . Frequency of Communication with Friends and Family:   . Frequency of Social Gatherings with Friends and Family:   . Attends Religious Services:   . Active Member of Clubs or Organizations:   . Attends Archivist Meetings:   Marland Kitchen Marital Status:     Hospital Course: 15-minute checks patient did not show any dangerous behavior.  She was compliant with medication.  Physically stable.  No sign of current dangerousness.  Continues to have a believe that she may be pregnant but is not acting dangerously on it.  Agreeable to other medication.  Patient no longer meets commitment criteria.  She will be discharged home and follow-[facility] will [facility].  Physical Findings: AIMS:  , ,  ,  ,    CIWA:    COWS:     Musculoskeletal: Strength & Muscle Tone: within normal limits Gait & Station: normal Patient leans: N/A  Psychiatric Specialty Exam: Physical Exam  Nursing note and vitals reviewed. Constitutional: She appears well-developed and well-nourished.  HENT:  Head: Normocephalic and atraumatic.  Eyes: Pupils are equal, round, and reactive to light. Conjunctivae are normal.  Cardiovascular: Regular rhythm and normal heart sounds.  Respiratory: Effort normal. No respiratory distress.  GI: Soft.  Musculoskeletal:        General: Normal range of motion.     Cervical back: Normal range of motion.  Neurological: She is alert.  Skin: Skin is warm and dry.  Psychiatric: She has a normal mood and affect. Her speech is normal and behavior is normal. Judgment normal. Thought content is delusional. Cognition and memory are normal. She expresses no homicidal and no suicidal ideation.    Review of Systems  Constitutional:  Negative.   HENT: Negative.   Eyes: Negative.   Respiratory: Negative.   Cardiovascular: Negative.   Gastrointestinal: Positive for abdominal distention.  Musculoskeletal: Negative.   Skin: Negative.   Neurological: Negative.   Psychiatric/Behavioral: Negative.     Blood pressure (!) 138/98, pulse 75, temperature 97.8 F (36.6 C), temperature source Oral, resp. rate 18, height 4\' 11"  (1.499 m), weight 68 kg, SpO2 100 %.Body mass index is 30.28 kg/m.  General Appearance: Casual  Eye Contact:  Good  Speech:  Clear and Coherent  Volume:  Normal  Mood:  Euthymic  Affect:  Congruent  Thought Process:  Goal Directed  Orientation:  Full (  Time, Place, and Person)  Thought Content:  Logical  Suicidal Thoughts:  No  Homicidal Thoughts:  No  Memory:  Immediate;   Fair Recent;   Fair Remote;   Fair  Judgement:  Fair  Insight:  Fair  Psychomotor Activity:  Decreased  Concentration:  Concentration: Fair  Recall:  [place] of Knowledge:  Fair  Language:  Fair  Akathisia:  No  Handed:  Right  AIMS (if indicated):     Assets:  Desire for Improvement  ADL's:  Intact  Cognition:  Impaired,  Mild  Sleep:  Number of Hours: 3     Have you used any form of tobacco in the last 30 days? (Cigarettes, Smokeless Tobacco, Cigars, and/or Pipes): Yes  Has this patient used any form of tobacco in the last 30 days? (Cigarettes, Smokeless Tobacco, Cigars, and/or Pipes) Yes, No  Blood Alcohol level:  Lab Results  Component Value Date   ETH <10 06/18/2019   ETH <5 [id number]    Metabolic Disorder Labs:  Lab Results  Component Value Date   HGBA1C 5.5 06/20/2019   MPG 111.15 06/20/2019   MPG 108.28 09/27/2018   No results found for: PROLACTIN Lab Results  Component Value Date   CHOL 165 08/08/2017   TRIG 107 08/08/2017   HDL 66 08/08/2017   CHOLHDL 2.5 08/08/2017   VLDL 16 06/24/2015   LDLCALC 78 08/08/2017   LDLCALC 75 01/24/2016    See Psychiatric Specialty Exam and Suicide  Risk Assessment completed by Attending Physician prior to discharge.  Discharge destination:  Home  Is patient on multiple antipsychotic therapies at discharge:  No   Has Patient had three or more failed trials of antipsychotic monotherapy by history:  No  Recommended Plan for Multiple Antipsychotic Therapies: NA  Discharge Instructions    Diet - low sodium heart healthy   Complete by: As directed    Increase activity slowly   Complete by: As directed      Allergies as of 06/23/2019      Reactions   Aspirin Other (See Comments)   Liver problems   Roxicodone [oxycodone Hcl] Itching      Medication List    STOP taking these medications   cetirizine 10 MG tablet Commonly known as: ZYRTEC   diclofenac Sodium 1 % Gel Commonly known as: Voltaren   Fish Oil 1000 MG Caps   FLUoxetine 20 MG capsule Commonly known as: PROZAC   fluticasone 50 MCG/ACT nasal spray Commonly known as: FLONASE   megestrol 625 MG/5ML suspension Commonly known as: MEGACE ES   methocarbamol 500 MG tablet Commonly known as: ROBAXIN   Multi-Vitamins Tabs   PEG 3350 17 g Pack   predniSONE 10 MG (21) Tbpk tablet Commonly known as: STERAPRED UNI-PAK 21 TAB   Psyllium 48.57 % Powd   senna 8.6 MG Tabs tablet Commonly known as: SENOKOT   Tab-A-Vite Tabs   vitamin B-12 50 MCG tablet Commonly known as: CYANOCOBALAMIN   vitamin C 500 MG tablet Commonly known as: ASCORBIC ACID     TAKE these medications     Indication  albuterol 108 (90 Base) MCG/ACT inhaler Commonly known as: VENTOLIN HFA Inhale 2 puffs into the lungs every 6 (six) hours as needed for wheezing or shortness of breath.  Indication: Asthma   amLODipine 10 MG tablet Commonly known as: NORVASC Take 1 tablet (10 mg total) by mouth daily. Start taking on: June 24, 2019 What changed: when to take this  Indication: High  Blood Pressure Disorder   bictegravir-emtricitabine-tenofovir AF 50-200-25 MG Tabs tablet Commonly known  as: BIKTARVY Take 1 tablet by mouth daily. Start taking on: June 24, 2019  Indication: HIV Disease   docusate sodium 100 MG capsule Commonly known as: Colace Take 1 capsule (100 mg total) by mouth 2 (two) times daily.  Indication: Constipation   loratadine 10 MG tablet Commonly known as: CLARITIN Take 1 tablet (10 mg total) by mouth daily. Start taking on: June 24, 2019  Indication: Runny Nose   pantoprazole 40 MG tablet Commonly known as: PROTONIX Take 1 tablet (40 mg total) by mouth daily. What changed: when to take this  Indication: Gastroesophageal Reflux Disease   QUEtiapine 300 MG tablet Commonly known as: SEROQUEL Take 1 tablet (300 mg total) by mouth at bedtime.  Indication: Manic Phase of Manic-Depression, Schizophrenia      Follow-up Information    [facility] Follow up on 06/30/2019.   Why: Appointment is scheduled for 06/30/2019 at 11AM.  Appointment is in person.  Thanks! Contact information: [place] [address] [phone]           Follow-up recommendations:  Activity:  Activity as tolerated Diet:  Regular diet Other:  [facility]  Comments: Prescriptions given at discharge  Signed: Alethia Berthold, MD 06/23/2019, 10:56 AM

## 2019-06-23 NOTE — BHH Suicide Risk Assessment (Signed)
[facility] Discharge Suicide Risk Assessment   Principal Problem: Bipolar affective [facility]) Discharge Diagnoses: Principal Problem:   Bipolar affective ([place]) Active Problems:   Human immunodeficiency virus (HIV) disease ([place])   Hepatitis C virus infection without hepatic coma   Hypertension associated with diabetes ([place])   Total Time spent with patient: 30 minutes  Musculoskeletal: Strength & Muscle Tone: within normal limits Gait & Station: normal Patient leans: N/A  Psychiatric Specialty Exam: Review of Systems  Constitutional: Negative.   HENT: Negative.   Eyes: Negative.   Respiratory: Negative.   Cardiovascular: Negative.   Gastrointestinal: Negative.   Musculoskeletal: Negative.   Skin: Negative.   Neurological: Negative.   Psychiatric/Behavioral: Negative.     Blood pressure (!) 138/98, pulse 75, temperature 97.8 F (36.6 C), temperature source Oral, resp. rate 18, height 4\' 11"  (1.499 m), weight 68 kg, SpO2 100 %.Body mass index is 30.28 kg/m.  General Appearance: Casual  Eye Contact::  Good  Speech:  Clear and [id number]  Volume:  Normal  Mood:  Euthymic  Affect:  Congruent  Thought Process:  Goal Directed  Orientation:  Full (Time, Place, and Person)  Thought Content:  Logical  Suicidal Thoughts:  No  Homicidal Thoughts:  No  Memory:  Immediate;   Fair Recent;   Fair Remote;   Fair  Judgement:  Fair  Insight:  Fair  Psychomotor Activity:  Normal  Concentration:  Fair  Recall:  [employer] of Knowledge:Fair  Language: Fair  Akathisia:  No  Handed:  Right  AIMS (if indicated):     Assets:  Desire for Improvement Housing Physical Health  Sleep:  Number of Hours: 3  Cognition: WNL  ADL's:  Intact   Mental Status Per Nursing Assessment::   On Admission:  NA  Demographic Factors:  Living alone  Loss Factors: NA  Historical Factors: Impulsivity  Risk Reduction Factors:   Positive social support and Positive therapeutic relationship  Continued  Clinical Symptoms:  Bipolar Disorder:   Mixed State Schizophrenia:   Paranoid or undifferentiated type  Cognitive Features That Contribute To Risk:  None    Suicide Risk:  Minimal: No identifiable suicidal ideation.  Patients presenting with no risk factors but with morbid ruminations; may be classified as minimal risk based on the severity of the depressive symptoms  Follow-up Information    [facility] Follow up on 06/30/2019.   Why: Appointment is scheduled for 06/30/2019 at 11AM.  Appointment is in person.  Thanks! Contact information: [place] [address] [phone]           Plan Of Care/Follow-up recommendations:  Activity:  Activity as tolerated Diet:  Regular diet Other:  [facility]  Alethia Berthold, MD 06/23/2019, 10:51 AM

## 2019-06-23 NOTE — Progress Notes (Signed)
Patient ID: Ashley [name], female   DOB: Jun 21, 1965, 54 y.o.   MRN: [id number] Follow-up to the previous discharge plan.  Patient's son called and requested that I call him back.  I asked the patient's prescription and she told me that she wished that I would call him back.  I spoke with the son who also put the payee and some other people on the phone.  All of them were out rage that the patient was being discharged today because she has still been making comments about being pregnant.  They were quite distressed and did not want to come and pick her up.  I explained that the patient did not appear to be acutely dangerous and had outpatient follow-up arranged.  This did not seem to make much impact on the as they repeated several times that they wanted the patient to be well, by which they seem to mean asymptomatic.  Therefore I have gone to the patient and informed her that discharge is canceled for today and we will have to reassess things.

## 2019-06-23 NOTE — BHH Group Notes (Signed)
[place] Group Notes:  (Nursing/MHT/Case Management/Adjunct)  Date:  06/23/2019  Time:  9:44 AM  Type of Therapy:  Community meeting   Participation Level:  Active  Participation Quality:  Appropriate, Attentive, Sharing and Supportive  Affect:  Appropriate  Cognitive:  Alert, Appropriate and Oriented  Insight:  Appropriate and Good  Engagement in Group:  Engaged and Supportive  Modes of Intervention:  Discussion and Orientation  Summary of Progress/Problems:  Ashley [name] 06/23/2019, 9:44 AM

## 2019-06-23 NOTE — Plan of Care (Signed)
  Problem: Education: Goal: Knowledge of [facility] Education information/materials will improve Outcome: Progressing Goal: Emotional status will improve Outcome: Progressing Goal: Mental status will improve Outcome: Progressing Goal: Verbalization of understanding the information provided will improve Outcome: Progressing   

## 2019-06-23 NOTE — Progress Notes (Signed)
Patient is verbally and physcically aggressive when she came to know that her discharge is cancelled.Patient is hitting  the wall,threwing things in her room and stated that she does not want to talk to anyone.

## 2019-06-23 NOTE — Progress Notes (Signed)
Patient was present in the day room. Getting a little bit irritable with one of her younger peers but kept her composure, saying "I get to go home tomorrow and they don't". Complained of heartburn and was medicated per prn order. Denies SI, HI and AVH.

## 2019-06-23 NOTE — Plan of Care (Signed)
Patient is sad and tearful states " my son does not want me.He thinks I am crazy.He hang up on me.I know I am not going to get out from this place." Support and reassurance given.Patient is in & out of room talking loud.Patient is requesting to see a doctor for her abdominal pain.Patient sated that it is not from her hernia.Patient states " miracle can happen any time.I may be pregnant."No aggressive behaviors noted.Compliant with medications.Appetite and energy level good.Support and encouragement given.

## 2019-06-24 MED ORDER — RISPERIDONE 1 MG PO TABS
0.5000 mg | ORAL_TABLET | Freq: Two times a day (BID) | ORAL | Status: DC
  Administered 2019-06-24 – 2019-06-26 (×4): 0.5 mg via ORAL
  Filled 2019-06-24 (×4): qty 1

## 2019-06-24 NOTE — Plan of Care (Signed)
  Problem: Education: Goal: Emotional status will improve Outcome: Progressing Goal: Mental status will improve Outcome: Progressing Goal: Verbalization of understanding the information provided will improve Outcome: Progressing   Problem: Safety: Goal: Ability to redirect hostility and anger into socially appropriate behaviors will improve Outcome: Progressing

## 2019-06-24 NOTE — Plan of Care (Signed)
Pt rates depression 10/10 and anxiety 3/10. Pt denies SI, HI and AVH. Pt was educated on care plan and verbalizes understanding. Pt was encouraged to attend groups. [name] Bullock RN Problem: Education: Goal: Knowledge of [place] [facility] Education information/materials will improve Outcome: Progressing Goal: Emotional status will improve Outcome: Progressing Goal: Mental status will improve Outcome: Progressing Goal: Verbalization of understanding the information provided will improve Outcome: Progressing   Problem: Safety: Goal: Periods of time without injury will increase Outcome: Progressing   Problem: Activity: Goal: Will verbalize the importance of balancing activity with adequate rest periods Outcome: Progressing   Problem: Safety: Goal: Ability to redirect hostility and anger into socially appropriate behaviors will improve Outcome: Progressing Goal: Ability to remain free from injury will improve Outcome: Progressing

## 2019-06-24 NOTE — Progress Notes (Signed)
Pt requested a PRN for a headache. She got teary eyed so  I gave her support and she replied " I am the closest thing next to God, I have a white light around me, I'm a lefty". [name] Bullock RN

## 2019-06-24 NOTE — Progress Notes (Signed)
Patient presented in sad and depressed  mood during encounter.  She stated that she was suppose to go home today,  but her son prevented that from happening.  She reports being upset and hurt that her son would do that to her. She denied SI/HI/AVH and anxiety, but does endorse depression, which she rated at 8 due to the situation with her son and missing her grandson. Patient received prescribed medications and tolerated without incident. Patient is active on the unit interacting well with peers and attending group.  Patient informed to notify staff with any concerns and remains safe on the unit with 15 minute safety checks.

## 2019-06-24 NOTE — Progress Notes (Signed)
Recreation Therapy Notes          Ashley [name] 06/24/2019 11:32 AM

## 2019-06-24 NOTE — Progress Notes (Addendum)
[facility] MD Progress Note  06/24/2019 11:39 AM Ashley [name]  MRN:  [id number] Subjective:  Eh is 54 year old female who presented with abdominal pain and delusional beliefs that she is pregnant. On this day she continue to endorse being pregnant and was very animated and agitated when describing her pregnancy and wanting to go home. Patient continues to ramble that she did have sex, she is going to be marrying a Environmental education officer and her son needs to be ok with that. She denies any current hallucinations, suicidal ideations or homicidal ideations. Today her energy is geared towards the physician as she was notified by her son " the doctor is lying. My son came to pick me up yesterday and the doctor wouldn't let me go. I am pregnant and he is the one that needs to be looked at. " During that time the patient is observed to be jumping up and down becoming agitated, however she is redirectable. She also is observed to be pulling up her shirt to show off her stomach which is appeared to be round, protruding and soft. Of note her urine hcg and I-stat hcg are both negative, in addition to a CT scan of abdomen and pelvis which is unremarkable.     Principal Problem: Bipolar affective ([place]) Diagnosis: Principal Problem:   Bipolar affective ([place]) Active Problems:   Human immunodeficiency virus (HIV) disease ([place])   Hepatitis C virus infection without hepatic coma   Hypertension associated with diabetes ([place])  Total Time spent with patient: 30 minutes  Past Psychiatric History: Patient has a history of mood disorder.  In 2016 she was [facility] with what seems like depressive symptoms and was diagnosed with depression with psychotic features although the diagnosis of bipolar is present in her chart as well.  Patient reports that she had had hospitalizations before that.  She admits to having had suicidal thoughts several years ago but denies anything recently.  Apparently she is still  [facility] and says that she is regularly on Prozac trazodone and Seroquel.  Denies any homicidal behavior.  She admits to a history of crack cocaine abuse but says she has been sober for a couple of years.  She is HIV positive but claims to be fully compliant with her medication.   Past Medical History:  Past Medical History:  Diagnosis Date  . Ankle fracture   . Anxiety   . Asthma   . Collagen vascular disease ([place])   . COPD (chronic obstructive pulmonary disease) ([place])   . Depression   . Diabetes mellitus   . Elevated LFTs 01/13/2012  . Hepatitis C   . Hernia   . HIV (human immunodeficiency virus infection) ([place])   . HIV (human immunodeficiency virus infection) ([place])   . Hypertension   . Hypothyroidism   . Pinched nerve   . Scoliosis   . TB (tuberculosis)     Past Surgical History:  Procedure Laterality Date  . BIOPSY  05/08/2018   Procedure: biopsy;  Surgeon: Rogene Houston, MD;  [facility];  Service: Endoscopy;;  proximal transverse colon polyp  . CESAREAN SECTION    . CHOLECYSTECTOMY    . COLONOSCOPY N/A 05/08/2018   Procedure: COLONOSCOPY;  Surgeon: Rogene Houston, MD;  [facility];  Service: Endoscopy;  Laterality: N/A;  2:00  . HERNIA REPAIR    . INCISIONAL HERNIA REPAIR N/A 09/30/2018   Procedure: OPEN HERNIA REPAIR INCISIONAL WITH MESH;  Surgeon: Virl Cagey, MD;  Location: [facility] ORS;  Service: General;  Laterality: N/A;  . LIVER BIOPSY     Family History:  Family History  Problem Relation Age of Onset  . Diabetes Mother   . Hypertension Mother   . Stroke Mother        died 10-08-2013 . Cancer Sister   . Cancer Maternal Aunt    Family Psychiatric  History: Denies any Social History:  Social History   Substance and Sexual Activity  Alcohol Use Not Currently  . Alcohol/week: 3.0 standard drinks  . Types: 3 Cans of beer per week     Social History   Substance and Sexual Activity  Drug Use Not Currently  . Types:  Marijuana, Cocaine    Social History   Socioeconomic History  . Marital status: Single    Spouse name: Not on file  . Number of children: 2  . Years of education: Not on file  . Highest education level: Not on file  Occupational History  . Occupation: Disability  Tobacco Use  . Smoking status: Current Every Day Smoker    Packs/day: 1.00    Years: 35.00    Pack years: 35.00    Types: Cigarettes  . Smokeless tobacco: Never Used  . Tobacco comment: 1 pack every 2-3 days  Substance and Sexual Activity  . Alcohol use: Not Currently    Alcohol/week: 3.0 standard drinks    Types: 3 Cans of beer per week  . Drug use: Not Currently    Types: Marijuana, Cocaine  . Sexual activity: Not Currently    Birth control/protection: Condom, None    Comment: condoms offered, condoms and lube provided  Other Topics Concern  . Not on file  Social History Narrative   Pt lives in an apartment by herself.  Pt stated that she is married and that her husband is in a Corporate treasurer, and that husband is being released yesterday or today.  Asked several times who Pt's psychiatric provider is.   Social Determinants of Health   Financial Resource Strain:   . Difficulty of Paying Living Expenses:   Food Insecurity:   . Worried About Charity fundraiser in the Last Year:   . Arboriculturist in the Last Year:   Transportation Needs:   . Film/video editor (Medical):   Marland Kitchen Lack of Transportation (Non-Medical):   Physical Activity:   . Days of Exercise per Week:   . Minutes of Exercise per Session:   Stress:   . Feeling of Stress :   Social Connections:   . Frequency of Communication with Friends and Family:   . Frequency of Social Gatherings with Friends and Family:   . Attends Religious Services:   . Active Member of Clubs or Organizations:   . Attends Archivist Meetings:   Marland Kitchen Marital Status:    Additional Social History:                         Sleep:  Fair  Appetite:  Good  Current Medications: Current Facility-Administered Medications  Medication Dose Route Frequency Provider Last Rate Last Admin  . acetaminophen (TYLENOL) tablet 650 mg  650 mg Oral Q6H PRN Money, Lowry Ram, FNP   650 mg at 06/24/19 [id number]  . albuterol (PROVENTIL) (2.5 MG/3ML) 0.083% nebulizer solution 3 mL  3 mL Inhalation Q6H PRN Money, Lowry Ram, FNP   3 mL at 06/24/19  [id number]  . alum & mag hydroxide-simeth (MAALOX/MYLANTA) 200-200-20 MG/5ML suspension 30 mL  30 mL Oral Q4H PRN Money, Lowry Ram, FNP   30 mL at 06/23/19 2102  . amLODipine (NORVASC) tablet 10 mg  10 mg Oral Daily Money, Lowry Ram, FNP   10 mg at 06/24/19 0818  . bictegravir-emtricitabine-tenofovir AF (BIKTARVY) 50-200-25 MG per tablet 1 tablet  1 tablet Oral Daily Money, Lowry Ram, FNP   1 tablet at 06/24/19 0819  . fluticasone (FLONASE) 50 MCG/ACT nasal spray 2 spray  2 spray Each Nare Daily Money, Lowry Ram, FNP   2 spray at 06/23/19 0809  . hydrOXYzine (ATARAX/VISTARIL) tablet 25 mg  25 mg Oral TID PRN Money, Lowry Ram, FNP   25 mg at 06/20/19 2126  . loratadine (CLARITIN) tablet 10 mg  10 mg Oral Daily Money, Lowry Ram, FNP   10 mg at 06/24/19 0818  . magnesium hydroxide (MILK OF MAGNESIA) suspension 30 mL  30 mL Oral Daily PRN Money, Lowry Ram, FNP      . pantoprazole (PROTONIX) EC tablet 40 mg  40 mg Oral Daily Clapacs, Madie Reno, MD   40 mg at 06/24/19 0818  . QUEtiapine (SEROQUEL) tablet 300 mg  300 mg Oral QHS Clapacs, John T, MD   300 mg at 06/23/19 2050  . traZODone (DESYREL) tablet 50 mg  50 mg Oral QHS PRN Money, Lowry Ram, FNP   50 mg at 06/20/19 2126    Lab Results: No results found for this or any previous visit (from the past 48 hour(s)).  Blood Alcohol level:  Lab Results  Component Value Date   ETH <10 06/18/2019   ETH <5 [id number]    Metabolic Disorder Labs: Lab Results  Component Value Date   HGBA1C 5.5 06/20/2019   MPG 111.15 06/20/2019   MPG 108.28 09/27/2018   No results found for:  PROLACTIN Lab Results  Component Value Date   CHOL 165 08/08/2017   TRIG 107 08/08/2017   HDL 66 08/08/2017   CHOLHDL 2.5 08/08/2017   VLDL 16 06/24/2015   LDLCALC 78 08/08/2017   LDLCALC 75 01/24/2016    Physical Findings: AIMS:  , ,  ,  ,    CIWA:    COWS:     Musculoskeletal: Strength & Muscle Tone: within normal limits Gait & Station: normal Patient leans: Right  Psychiatric Specialty Exam: Physical Exam  Review of Systems  Blood pressure 119/86, pulse 75, temperature 98.4 F (36.9 C), temperature source Oral, resp. rate 18, height 4\' 11"  (1.499 m), weight 68 kg, SpO2 100 %.Body mass index is 30.28 kg/m.  General Appearance: Fairly Groomed  Eye Contact:  Fair  Speech:  Clear and Coherent and Normal Rate  Volume:  Increased  Mood:  Euthymic and Irritable  Affect:  Blunt and Full Range  Thought Process:  Irrelevant, Linear and Descriptions of Associations: Tangential  Orientation:  Full (Time, Place, and Person)  Thought Content:  Illogical, Delusions, Paranoid Ideation, Rumination and Tangential  Suicidal Thoughts:  No  Homicidal Thoughts:  No  Memory:  Immediate;   Fair Recent;   Fair Remote;   Fair  Judgement:  Fair  Insight:  Fair  Psychomotor Activity:  psychomotor agitation  Concentration:  Concentration: Fair and Attention Span: Fair  Recall:  [employer] of Knowledge:  Fair  Language:  Good  Akathisia:  No  Handed:  Right  AIMS (if indicated):     Assets:  Communication Skills Desire for Improvement Financial  Resources/Insurance Housing Physical Health Social Support  ADL's:  Intact  Cognition:  WNL  Sleep:  Number of Hours: 7.75     Treatment Plan Summary: Daily contact with patient to assess and evaluate symptoms and progress in treatment, Medication management and Plan Continue current medications at this time. Seroquel 300mg  po QHS. Hydroxyzine 25mg  po TID prn for anxiety, and Trazodone 50mg  po qhs prn.  Will add low dose Risperdal 0.5mg   po BID for delusions, pending EKG results.   All home medications have been resumed.  Suella Broad, FNP 06/24/2019, 11:39 AM

## 2019-06-24 NOTE — Progress Notes (Signed)
Pt is agitated and irritable. Pt wasn't to take her tray to her room and eat alone. She is upset that she does not have the remote to the tv. She punched a wall with her right hand but no injury observed other than her usual  pain that she has in her wrist. Pt is calm now. [name] Bullock RN

## 2019-06-24 NOTE — Progress Notes (Signed)
Pt has attended groups and went outside to courtyard.. Pt keeps herself occupied coloring. Pt is irritable and agitated when her request are denied. [name] Bullock RN

## 2019-06-25 NOTE — Progress Notes (Signed)
Patient alert and oriented x 4, affect is blunted, thoughts are organized, she appeared less irritable, more receptive to staff, cooperative with treatment plan, interacting appropriately with peers and staff no distress noted.   Patient was complaint with medication regimen not disruptive on the unit, attended evening wrap up group. 15 minutes safety checks maintained will continue to monitor closely.

## 2019-06-25 NOTE — Progress Notes (Signed)
[facility]  06/25/2019 6:08 PM Ashley [name]  MRN:  [id number] Subjective: Patient seen chart reviewed.  Patient continues to express delusions about being pregnant.  She is easy to agitate but has not been violent or threatening.  Generally cooperative with treatment.  No specific complaints except requesting discharge Principal Problem: Bipolar affective ([place]) Diagnosis: Principal Problem:   Bipolar affective ([place]) Active Problems:   Human immunodeficiency virus (HIV) disease ([place])   Hepatitis C virus infection without hepatic coma   Hypertension associated with diabetes ([place])  Total Time spent with patient: 30 minutes  Past Psychiatric History: Past history of bipolar disorder  Past Medical History:  Past Medical History:  Diagnosis Date  . Ankle fracture   . Anxiety   . Asthma   . Collagen vascular disease ([place])   . COPD (chronic obstructive pulmonary disease) ([place])   . Depression   . Diabetes mellitus   . Elevated LFTs 01/13/2012  . Hepatitis C   . Hernia   . HIV (human immunodeficiency virus infection) ([place])   . HIV (human immunodeficiency virus infection) ([place])   . Hypertension   . Hypothyroidism   . Pinched nerve   . Scoliosis   . TB (tuberculosis)     Past Surgical History:  Procedure Laterality Date  . BIOPSY  05/08/2018   Procedure: biopsy;  Surgeon: Rogene Houston, MD;  [facility];  Service: Endoscopy;;  proximal transverse colon polyp  . CESAREAN SECTION    . CHOLECYSTECTOMY    . COLONOSCOPY N/A 05/08/2018   Procedure: COLONOSCOPY;  Surgeon: Rogene Houston, MD;  [facility];  Service: Endoscopy;  Laterality: N/A;  2:00  . HERNIA REPAIR    . INCISIONAL HERNIA REPAIR N/A 09/30/2018   Procedure: OPEN HERNIA REPAIR INCISIONAL WITH MESH;  Surgeon: Virl Cagey, MD;  Location: [facility]S;  Service: General;  Laterality: N/A;  . LIVER BIOPSY     Family History:  Family History  Problem Relation Age of Onset  . Diabetes  Mother   . Hypertension Mother   . Stroke Mother        died October 03, 2013 . Cancer Sister   . Cancer Maternal Aunt    Family Psychiatric  History: See previous Social History:  Social History   Substance and Sexual Activity  Alcohol Use Not Currently  . Alcohol/week: 3.0 standard drinks  . Types: 3 Cans of beer per week     Social History   Substance and Sexual Activity  Drug Use Not Currently  . Types: Marijuana, Cocaine    Social History   Socioeconomic History  . Marital status: Single    Spouse name: Not on file  . Number of children: 2  . Years of education: Not on file  . Highest education level: Not on file  Occupational History  . Occupation: Disability  Tobacco Use  . Smoking status: Current Every Day Smoker    Packs/day: 1.00    Years: 35.00    Pack years: 35.00    Types: Cigarettes  . Smokeless tobacco: Never Used  . Tobacco comment: 1 pack every 2-3 days  Substance and Sexual Activity  . Alcohol use: Not Currently    Alcohol/week: 3.0 standard drinks    Types: 3 Cans of beer per week  . Drug use: Not Currently    Types: Marijuana, Cocaine  . Sexual activity: Not Currently    Birth control/protection: Condom, None    Comment: condoms  offered, condoms and lube provided  Other Topics Concern  . Not on file  Social History Narrative   Pt lives in an apartment by herself.  Pt stated that she is married and that her husband is in a Corporate treasurer, and that husband is being released yesterday or today.  Asked several times who Pt's psychiatric provider is.   Social Determinants of Health   Financial Resource Strain:   . Difficulty of Paying Living Expenses:   Food Insecurity:   . Worried About Charity fundraiser in the Last Year:   . Arboriculturist in the Last Year:   Transportation Needs:   . Film/video editor (Medical):   Marland Kitchen Lack of Transportation (Non-Medical):   Physical Activity:   . Days of Exercise per Week:   . Minutes of  Exercise per Session:   Stress:   . Feeling of Stress :   Social Connections:   . Frequency of Communication with Friends and Family:   . Frequency of Social Gatherings with Friends and Family:   . Attends Religious Services:   . Active Member of Clubs or Organizations:   . Attends Archivist Meetings:   Marland Kitchen Marital Status:    Additional Social History:                         Sleep: Fair  Appetite:  Fair  Current Medications: Current Facility-Administered Medications  Medication Dose Route Frequency Provider Last Rate Last Admin  . acetaminophen (TYLENOL) tablet 650 mg  650 mg Oral Q6H PRN Money, Lowry Ram, FNP   650 mg at 06/25/19 1352  . albuterol (PROVENTIL) (2.5 MG/3ML) 0.083% nebulizer solution 3 mL  3 mL Inhalation Q6H PRN Money, Lowry Ram, FNP   3 mL at 06/24/19 0942  . alum & mag hydroxide-simeth (MAALOX/MYLANTA) 200-200-20 MG/5ML suspension 30 mL  30 mL Oral Q4H PRN Money, Lowry Ram, FNP   30 mL at 06/23/19 2102  . amLODipine (NORVASC) tablet 10 mg  10 mg Oral Daily Money, Lowry Ram, FNP   10 mg at 06/25/19 0743  . bictegravir-emtricitabine-tenofovir AF (BIKTARVY) 50-200-25 MG per tablet 1 tablet  1 tablet Oral Daily Money, Lowry Ram, FNP   1 tablet at 06/25/19 0744  . fluticasone (FLONASE) 50 MCG/ACT nasal spray 2 spray  2 spray Each Nare Daily Money, Lowry Ram, FNP   2 spray at 06/25/19 0744  . hydrOXYzine (ATARAX/VISTARIL) tablet 25 mg  25 mg Oral TID PRN Money, Lowry Ram, FNP   25 mg at 06/20/19 2126  . loratadine (CLARITIN) tablet 10 mg  10 mg Oral Daily Money, Lowry Ram, FNP   10 mg at 06/25/19 0743  . magnesium hydroxide (MILK OF MAGNESIA) suspension 30 mL  30 mL Oral Daily PRN Money, Lowry Ram, FNP      . pantoprazole (PROTONIX) EC tablet 40 mg  40 mg Oral Daily Daphney Hopke, Madie Reno, MD   40 mg at 06/25/19 0744  . QUEtiapine (SEROQUEL) tablet 300 mg  300 mg Oral QHS Laytoya Ion T, MD   300 mg at 06/24/19 2112  . risperiDONE (RISPERDAL) tablet 0.5 mg  0.5 mg  Oral BID Suella Broad, FNP   0.5 mg at 06/25/19 1658  . traZODone (DESYREL) tablet 50 mg  50 mg Oral QHS PRN Money, Lowry Ram, FNP   50 mg at 06/24/19 2112    Lab Results: No results found for this or any previous visit (  from the past 48 hour(s)).  Blood Alcohol level:  Lab Results  Component Value Date   ETH <10 06/18/2019   ETH <5 [id number]    Metabolic Disorder Labs: Lab Results  Component Value Date   HGBA1C 5.5 06/20/2019   MPG 111.15 06/20/2019   MPG 108.28 09/27/2018   No results found for: PROLACTIN Lab Results  Component Value Date   CHOL 165 08/08/2017   TRIG 107 08/08/2017   HDL 66 08/08/2017   CHOLHDL 2.5 08/08/2017   VLDL 16 06/24/2015   LDLCALC 78 08/08/2017   LDLCALC 75 01/24/2016    Physical Findings: AIMS:  , ,  ,  ,    CIWA:    COWS:     Musculoskeletal: Strength & Muscle Tone: within normal limits Gait & Station: normal Patient leans: N/A  Psychiatric Specialty Exam: Physical Exam  Nursing note and vitals reviewed. Constitutional: She appears well-developed and well-nourished.  HENT:  Head: Normocephalic and atraumatic.  Eyes: Pupils are equal, round, and reactive to light. Conjunctivae are normal.  Cardiovascular: Regular rhythm and normal heart sounds.  Respiratory: Effort normal. No respiratory distress.  GI: Soft.  Musculoskeletal:        General: Normal range of motion.     Cervical back: Normal range of motion.  Neurological: She is alert.  Skin: Skin is warm and dry.  Psychiatric: Her affect is labile. Her speech is tangential. She is agitated. Thought content is paranoid. Cognition and memory are impaired. She expresses impulsivity.    Review of Systems  Constitutional: Negative.   HENT: Negative.   Eyes: Negative.   Respiratory: Negative.   Cardiovascular: Negative.   Gastrointestinal: Negative.   Musculoskeletal: Negative.   Skin: Negative.   Neurological: Negative.   Psychiatric/Behavioral: Positive for  dysphoric mood. The patient is nervous/anxious.     Blood pressure 132/81, pulse 75, temperature 98.7 F (37.1 C), temperature source Oral, resp. rate 17, height 4\' 11"  (1.499 m), weight 68 kg, SpO2 100 %.Body mass index is 30.28 kg/m.  General Appearance: Casual  Eye Contact:  Fair  Speech:  Clear and Coherent  Volume:  Increased  Mood:  Irritable  Affect:  Congruent  Thought Process:  Disorganized  Orientation:  Full (Time, Place, and Person)  Thought Content:  Illogical  Suicidal Thoughts:  No  Homicidal Thoughts:  No  Memory:  Immediate;   Fair Recent;   Fair Remote;   Fair  Judgement:  Impaired  Insight:  Shallow  Psychomotor Activity:  Decreased  Concentration:  Concentration: Poor  Recall:  Poor  Fund of Knowledge:  Poor  Language:  Fair  Akathisia:  No  Handed:  Right  AIMS (if indicated):     Assets:  Desire for Improvement  ADL's:  Intact  Cognition:  WNL  Sleep:  Number of Hours: 5.75     Treatment Plan Summary: Daily contact with patient to assess and evaluate symptoms and progress in treatment, Medication management and Plan No change to current medication management.  Possible discharge tomorrow  Alethia Berthold, MD 06/25/2019, 6:08 PM

## 2019-06-25 NOTE — Progress Notes (Signed)
Patient approached nursing staff and requested Tylenol for pain in hands.  She denied having arthritis when nurse inquired about her pain and reported that the pain is stemming from punching the her pillow out of frustration.  Tylenol was given and tolerated without incident.

## 2019-06-25 NOTE — Progress Notes (Signed)
Recreation Therapy Notes   Date: 06/25/2019  Time: 9:30 am  Location: Craft room   Behavioral response: Appropriate   Intervention Topic: Happiness    Discussion/Intervention:  Group content today was focused on Happiness. The group defined happiness and described where happiness comes from. Individuals identified what makes them happy and how they go about making others happy. Patients expressed things that stop them from being happy and ways they can improve their happiness. The group stated reasons why it is important to be happy. The group participated in the intervention "My Happiness", where they had a chance to identify and express things that make them happy. Clinical Observations/Feedback:  Patient came to group and identified happiness as a celebration. Participant was focused on the topic and discussion at hand.  Individual was social with peers and staff while participating in the intervention.   Elbridge Magowan LRT/CTRS          Ramsha Lonigro 06/25/2019 12:49 PM

## 2019-06-25 NOTE — [facility] Group Notes (Signed)
LCSW Group Therapy Note  06/25/2019 1:00 PM  Type of Therapy/Topic:  Group Therapy:  Emotion Regulation  Participation Level:  None   Description of Group:   The purpose of this group is to assist patients in learning to regulate negative emotions and experience positive emotions. Patients will be guided to discuss ways in which they have been vulnerable to their negative emotions. These vulnerabilities will be juxtaposed with experiences of positive emotions or situations, and patients will be challenged to use positive emotions to combat negative ones. Special emphasis will be placed on coping with negative emotions in conflict situations, and patients will process healthy conflict resolution skills.  Therapeutic Goals: 1. Patient will identify two positive emotions or experiences to reflect on in order to balance out negative emotions 2. Patient will label two or more emotions that they find the most difficult to experience 3. Patient will demonstrate positive conflict resolution skills through discussion and/or role plays  Summary of Patient Progress: Patient entered group to yell that she was "suing everyone in the hospital". Patient then left group.    Therapeutic Modalities:   Cognitive Behavioral Therapy Feelings Identification Dialectical Behavioral Therapy  Assunta Curtis, MSW, LCSW 06/25/2019 2:33 PM

## 2019-06-25 NOTE — Plan of Care (Signed)
Patient was agitated and crying when she came to know that she is not getting discharge today.Patient refused to take PM medications but took with encouragement.Denies SI,Hi and AVH. Continues to have the delusion of being pregnant.Patient easily get agitated with peers.Support and encouragement given.

## 2019-06-26 MED ORDER — RISPERIDONE 0.5 MG PO TABS: 0.5000 mg | ORAL_TABLET | Freq: Two times a day (BID) | ORAL | 1 refills | Status: DC

## 2019-06-26 NOTE — Plan of Care (Signed)
  Problem: Education: Goal: Knowledge of [facility] Education information/materials will improve Outcome: Progressing Goal: Emotional status will improve Outcome: Progressing Goal: Mental status will improve Outcome: Progressing Goal: Verbalization of understanding the information provided will improve Outcome: Progressing   

## 2019-06-26 NOTE — Progress Notes (Signed)
Patient denies SI/HI, denies A/V hallucinations. Patient verbalizes understanding of discharge instructions, follow up care and prescriptions. Patient given all belongings from [facility] locker. Patient escorted out by staff, transported by friend.

## 2019-06-26 NOTE — Tx Team (Signed)
Interdisciplinary Treatment and Diagnostic Plan Update  06/26/2019 Time of Session: 8:30am Ashley [name] MRN: [id number]  Principal Diagnosis: Bipolar affective ([place])  Secondary Diagnoses: Principal Problem:   Bipolar affective ([place]) Active Problems:   Human immunodeficiency virus (HIV) disease ([place])   Hepatitis C virus infection without hepatic coma   Hypertension associated with diabetes ([place])   Current Medications:  Current Facility-Administered Medications  Medication Dose Route Frequency Provider Last Rate Last Admin  . acetaminophen (TYLENOL) tablet 650 mg  650 mg Oral Q6H PRN Money, Lowry Ram, FNP   650 mg at 06/26/19 0018  . albuterol (PROVENTIL) (2.5 MG/3ML) 0.083% nebulizer solution 3 mL  3 mL Inhalation Q6H PRN Money, Lowry Ram, FNP   3 mL at 06/24/19 0942  . alum & mag hydroxide-simeth (MAALOX/MYLANTA) 200-200-20 MG/5ML suspension 30 mL  30 mL Oral Q4H PRN Money, Lowry Ram, FNP   30 mL at 06/23/19 2102  . amLODipine (NORVASC) tablet 10 mg  10 mg Oral Daily Money, Lowry Ram, FNP   10 mg at 06/26/19 0805  . bictegravir-emtricitabine-tenofovir AF (BIKTARVY) 50-200-25 MG per tablet 1 tablet  1 tablet Oral Daily Money, Lowry Ram, FNP   1 tablet at 06/26/19 0805  . fluticasone (FLONASE) 50 MCG/ACT nasal spray 2 spray  2 spray Each Nare Daily Money, Lowry Ram, FNP   2 spray at 06/25/19 0744  . hydrOXYzine (ATARAX/VISTARIL) tablet 25 mg  25 mg Oral TID PRN Money, Lowry Ram, FNP   25 mg at 06/20/19 2126  . loratadine (CLARITIN) tablet 10 mg  10 mg Oral Daily Money, Lowry Ram, FNP   10 mg at 06/26/19 0805  . magnesium hydroxide (MILK OF MAGNESIA) suspension 30 mL  30 mL Oral Daily PRN Money, Darnelle Maffucci B, FNP   30 mL at 06/25/19 1841  . pantoprazole (PROTONIX) EC tablet 40 mg  40 mg Oral Daily Clapacs, Madie Reno, MD   40 mg at 06/26/19 0805  . QUEtiapine (SEROQUEL) tablet 300 mg  300 mg Oral QHS Clapacs, John T, MD   300 mg at 06/25/19 2117  . risperiDONE (RISPERDAL) tablet 0.5 mg  0.5 mg Oral BID  Suella Broad, FNP   0.5 mg at 06/26/19 0804  . traZODone (DESYREL) tablet 50 mg  50 mg Oral QHS PRN Money, Lowry Ram, FNP   50 mg at 06/26/19 0018   Current Outpatient Medications  Medication Sig Dispense Refill  . albuterol (VENTOLIN HFA) 108 (90 Base) MCG/ACT inhaler Inhale 2 puffs into the lungs every 6 (six) hours as needed for wheezing or shortness of breath. 18 g 1  . amLODipine (NORVASC) 10 MG tablet Take 1 tablet (10 mg total) by mouth daily. 30 tablet 1  . bictegravir-emtricitabine-tenofovir AF (BIKTARVY) 50-200-25 MG TABS tablet Take 1 tablet by mouth daily. 30 tablet 1  . docusate sodium (COLACE) 100 MG capsule Take 1 capsule (100 mg total) by mouth 2 (two) times daily. 60 capsule 2  . loratadine (CLARITIN) 10 MG tablet Take 1 tablet (10 mg total) by mouth daily. 30 tablet 1  . pantoprazole (PROTONIX) 40 MG tablet Take 1 tablet (40 mg total) by mouth daily. 30 tablet 1  . QUEtiapine (SEROQUEL) 300 MG tablet Take 1 tablet (300 mg total) by mouth at bedtime. 30 tablet 1  . risperiDONE (RISPERDAL) 0.5 MG tablet Take 1 tablet (0.5 mg total) by mouth 2 (two) times daily. 60 tablet 1   PTA Medications: No medications prior to admission.    Patient Stressors: Health  problems Medication change or noncompliance Traumatic event  Patient Strengths: Motivation for treatment/growth Religious Affiliation Supportive family/friends  Treatment Modalities: Medication Management, Group therapy, Case management,  1 to 1 session with clinician, Psychoeducation, Recreational therapy.   Physician Treatment Plan for Primary Diagnosis: Bipolar affective ([place]) Long Term Goal(s): Improvement in symptoms so as ready for discharge Improvement in symptoms so as ready for discharge   Short Term Goals: Ability to verbalize feelings will improve Ability to demonstrate self-control will improve Ability to identify and develop effective coping behaviors will improve Ability to maintain clinical  measurements within normal limits will improve Compliance with prescribed medications will improve  Medication Management: Evaluate patient's response, side effects, and tolerance of medication regimen.  Therapeutic Interventions: 1 to 1 sessions, Unit Group sessions and Medication administration.  Evaluation of Outcomes: Adequate for Discharge  Physician Treatment Plan for Secondary Diagnosis: Principal Problem:   Bipolar affective ([place]) Active Problems:   Human immunodeficiency virus (HIV) disease ([place])   Hepatitis C virus infection without hepatic coma   Hypertension associated with diabetes ([place])  Long Term Goal(s): Improvement in symptoms so as ready for discharge Improvement in symptoms so as ready for discharge   Short Term Goals: Ability to verbalize feelings will improve Ability to demonstrate self-control will improve Ability to identify and develop effective coping behaviors will improve Ability to maintain clinical measurements within normal limits will improve Compliance with prescribed medications will improve     Medication Management: Evaluate patient's response, side effects, and tolerance of medication regimen.  Therapeutic Interventions: 1 to 1 sessions, Unit Group sessions and Medication administration.  Evaluation of Outcomes: Adequate for Discharge   RN Treatment Plan for Primary Diagnosis: Bipolar affective ([place]) Long Term Goal(s): Knowledge of disease and therapeutic regimen to maintain health will improve  Short Term Goals: Ability to remain free from injury will improve, Ability to verbalize frustration and anger appropriately will improve, Ability to demonstrate self-control, Ability to participate in decision making will improve, Ability to verbalize feelings will improve, Ability to identify and develop effective coping behaviors will improve and Compliance with prescribed medications will improve  Medication Management: RN will administer medications  as ordered by provider, will assess and evaluate patient's response and provide education to patient for prescribed medication. RN will report any adverse and/or side effects to prescribing provider.  Therapeutic Interventions: 1 on 1 counseling sessions, Psychoeducation, Medication administration, Evaluate responses to treatment, Monitor vital signs and CBGs as ordered, Perform/monitor CIWA, COWS, AIMS and Fall Risk screenings as ordered, Perform wound care treatments as ordered.  Evaluation of Outcomes: Adequate for Discharge   LCSW Treatment Plan for Primary Diagnosis: Bipolar affective ([place]) Long Term Goal(s): Safe transition to appropriate next level of care at discharge, Engage patient in therapeutic group addressing interpersonal concerns.  Short Term Goals: Engage patient in aftercare planning with referrals and resources, Increase social support, Increase ability to appropriately verbalize feelings, Increase emotional regulation, Identify triggers associated with mental health/substance abuse issues and Increase skills for wellness and recovery  Therapeutic Interventions: Assess for all discharge needs, 1 to 1 time with Social worker, Explore available resources and support systems, Assess for adequacy in community support network, Educate family and significant other(s) on suicide prevention, Complete Psychosocial Assessment, Interpersonal group therapy.  Evaluation of Outcomes: Adequate for Discharge   Progress in Treatment: Attending groups: Yes. Participating in groups: Yes. Taking medication as prescribed: Yes. Toleration medication: Yes. Family/Significant other contact made: No, will contact:  son Patient understands diagnosis: Yes. Discussing  patient identified problems/goals with staff: Yes. Medical problems stabilized or resolved: Yes. Denies suicidal/homicidal ideation: Yes. Issues/concerns per patient self-inventory: No. Other: none  New problem(s) identified: No,  Describe:  none Update 06/26/2019: no changes at this time.   New Short Term/Long Term Goal(s):  Update 06/26/2019: elimination of symptoms of psychosis, medication management for mood stabilization; elimination of SI thoughts; development of comprehensive mental wellness/sobriety plan.   Patient Goals:  "I want to work on my attitude" Update 06/26/2019: no changes at this time.   Discharge Plan or Barriers: Pt will receive follow up care with [facility].Update 06/26/2019: no changes at this time.   Reason for Continuation of Hospitalization: Aggression Suicidal ideation  Estimated Length of Stay: 3-5 days  Attendees: Patient:  06/22/2019   Physician: Dr. Weber Cooks, MD 06/22/2019   Nursing: West Pugh, RN 06/22/2019   RN Care Manager: 06/22/2019   Social Worker: Assunta Curtis, LCSW 06/22/2019   Recreational Therapist:  06/22/2019   Other:  06/22/2019  Other:  06/22/2019   Other: 06/22/2019    Scribe for Treatment Team: Charlott Rakes, [place] 06/22/2019 1:06 PM

## 2019-06-26 NOTE — Discharge Summary (Signed)
Physician Discharge Summary Note  Patient:  Ashley [name] is an 54 y.o., female MRN:  [id number] DOB:  07-09-1965 Patient phone:  [phone] (home)  Patient address:   [address] [place] [address],  Total Time spent with patient: 30 minutes  Date of Admission:  06/19/2019 Date of Discharge: June 26, 2019  Reason for Admission: Patient was admitted because of agitation and psychotic symptoms including belief that she was pregnant  Principal Problem: Bipolar affective [facility]) Discharge Diagnoses: Principal Problem:   Bipolar affective ([place]) Active Problems:   Human immunodeficiency virus (HIV) disease ([place])   Hepatitis C virus infection without hepatic coma   Hypertension associated with diabetes ([place])   Past Psychiatric History: Past history 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 Ankle fracture   . Anxiety   . Asthma   . Collagen vascular disease ( [place])   . COPD (chronic obstructive pulmonary disease) ([place])   . Depression   . Diabetes mellitus   . Elevated LFTs 01/13/2012  . Hepatitis C   . Hernia   . HIV (human immunodeficiency virus infection) ([place])   . HIV (human immunodeficiency virus infection) ([place])   . Hypertension   . Hypothyroidism   . Pinched nerve   . Scoliosis   . TB (tuberculosis)     Past Surgical History:  Procedure Laterality Date  . BIOPSY  05/08/2018   Procedure: biopsy;  Surgeon: Rogene Houston, MD;  Location: [facility];  Service: Endoscopy;;  proximal transverse colon polyp  . CESAREAN SECTION    . CHOLECYSTECTOMY    . COLONOSCOPY N/A 05/08/2018   Procedure: COLONOSCOPY;  Surgeon: Rogene Houston, MD;  Location: [facility];  Service: Endoscopy;  Laterality: N/A;  2:00  . HERNIA REPAIR    . INCISIONAL HERNIA REPAIR N/A 09/30/2018   Procedure: OPEN HERNIA REPAIR INCISIONAL WITH MESH;  Surgeon: Virl Cagey, MD;  Location: [facility] ORS;   Service: General;  Laterality: N/A;  . LIVER BIOPSY     Family History:  Family History  Problem Relation Age of Onset  . Diabetes Mother   . Hypertension Mother   . Stroke Mother        died 09-22-2013 . Cancer Sister   . Cancer Maternal Aunt    Family Psychiatric  History: None reported Social History:  Social History   Substance and Sexual Activity  Alcohol Use Not Currently  . Alcohol/week: 3.0 standard drinks  . Types: 3 Cans of beer per week     Social History   Substance and Sexual Activity  Drug Use Not Currently  . Types: Marijuana, Cocaine    Social History   Socioeconomic History  . Marital status: Single    Spouse name: Not on file  . Number of children: 2  . Years of education: Not on file  . Highest education level: Not on file  Occupational History  . Occupation: Disability  Tobacco Use  . Smoking status: Current Every Day Smoker    Packs/day: 1.00    Years: 35.00    Pack years: 35.00    Types: Cigarettes  . Smokeless tobacco: Never Used  . Tobacco comment: 1 pack every 2-3 days  Substance and Sexual Activity  . Alcohol use: Not Currently    Alcohol/week: 3.0 standard drinks    Types: 3 Cans of beer per week  . Drug use: Not Currently  Types: Marijuana, Cocaine  . Sexual activity: Not Currently    Birth control/protection: Condom, None    Comment: condoms offered, condoms and lube provided  Other Topics Concern  . Not on file  Social History Narrative   Pt lives in an apartment by herself.  Pt stated that she is married and that her husband is in a Corporate treasurer, and that husband is being released yesterday or today.  Asked several times who Pt's psychiatric provider is.   Social Determinants of Health   Financial Resource Strain:   . Difficulty of Paying Living Expenses:   Food Insecurity:   . Worried About Charity fundraiser in the Last Year:   . Arboriculturist in the Last Year:   Transportation Needs:   . Lexicographer (Medical):   Marland Kitchen Lack of Transportation (Non-Medical):   Physical Activity:   . Days of Exercise per Week:   . Minutes of Exercise per Session:   Stress:   . Feeling of Stress :   Social Connections:   . Frequency of Communication with Friends and Family:   . Frequency of Social Gatherings with Friends and Family:   . Attends Religious Services:   . Active Member of Clubs or Organizations:   . Attends Archivist Meetings:   Marland Kitchen Marital Status:     Hospital Course: Patient was engaged in individual and group counseling.  She was kept on 15-minute precautions.  Patient was started back on antipsychotic medication.  Patient gradually showed improvement in mood and behavior.  She was loud and intrusive at first but gradually became more appropriate.  At no time where she violent or showing any sign of self injury.  She continues to endorse some believes that she could possibly be pregnant but says she is open to the possibility that she is not and intends to follow-up with an OB/GYN.  She is taking medication currently.  She did not show full improvement until a low-dose of an extra antipsychotic was added and so she is being discharged on 2 of them.  [facility]  Physical Findings: AIMS:  , ,  ,  ,    CIWA:    COWS:     Musculoskeletal: Strength & Muscle Tone: within normal limits Gait & Station: normal Patient leans: N/A  Psychiatric Specialty Exam: Physical Exam  Nursing note and vitals reviewed. Constitutional: She appears well-developed and well-nourished.  HENT:  Head: Normocephalic and atraumatic.  Eyes: Pupils are equal, round, and reactive to light. Conjunctivae are normal.  Cardiovascular: Normal heart sounds.  Respiratory: Effort normal.  GI: Soft.  Musculoskeletal:        General: Normal range of motion.     Cervical back: Normal range of motion.  Neurological: She is alert.  Skin: Skin is warm and dry.  Psychiatric:  She has a normal mood and affect. Her behavior is normal. Judgment and thought content normal.    Review of Systems  Constitutional: Negative.   HENT: Negative.   Eyes: Negative.   Respiratory: Negative.   Cardiovascular: Negative.   Gastrointestinal: Negative.   Musculoskeletal: Negative.   Skin: Negative.   Neurological: Negative.   Psychiatric/Behavioral: Negative.     Blood pressure 116/80, pulse 72, temperature 98.7 F (37.1 C), temperature source Oral, resp. rate 17, height 4\' 11"  (1.499 m), weight 68 kg, SpO2 100 %.Body mass index is 30.28 kg/m.  General Appearance: Casual  Eye Contact:  Good  Speech:  Clear and Coherent  Volume:  Normal  Mood:  Euthymic  Affect:  Congruent  Thought Process:  Goal Directed  Orientation:  Full (Time, Place, and Person)  Thought Content:  Logical  Suicidal Thoughts:  No  Homicidal Thoughts:  No  Memory:  Immediate;   Fair Recent;   Fair Remote;   Fair  Judgement:  Fair  Insight:  Fair  Psychomotor Activity:  Normal  Concentration:  Concentration: Fair  Recall:  [place] of Knowledge:  Fair  Language:  Fair  Akathisia:  No  Handed:  Right  AIMS (if indicated):     Assets:  Desire for Improvement  ADL's:  Intact  Cognition:  WNL  Sleep:  Number of Hours: 6.15     Have you used any form of tobacco in the last 30 days? (Cigarettes, Smokeless Tobacco, Cigars, and/or Pipes): Yes  Has this patient used any form of tobacco in the last 30 days? (Cigarettes, Smokeless Tobacco, Cigars, and/or Pipes) Yes, No  Blood Alcohol level:  Lab Results  Component Value Date   ETH <10 06/18/2019   ETH <5 [id number]    Metabolic Disorder Labs:  Lab Results  Component Value Date   HGBA1C 5.5 06/20/2019   MPG 111.15 06/20/2019   MPG 108.28 09/27/2018   No results found for: PROLACTIN Lab Results  Component Value Date   CHOL 165 08/08/2017   TRIG 107 08/08/2017   HDL 66 08/08/2017   CHOLHDL 2.5 08/08/2017   VLDL 16 06/24/2015    LDLCALC 78 08/08/2017   LDLCALC 75 01/24/2016    See Psychiatric Specialty Exam and Suicide Risk Assessment completed by Attending Physician prior to discharge.  Discharge destination:  Home  Is patient on multiple antipsychotic therapies at discharge:  Yes,   Do you recommend tapering to monotherapy for antipsychotics?  No   Has Patient had three or more failed trials of antipsychotic monotherapy by history:  Yes,   Antipsychotic medications that previously failed include:   1.  Seroquel., 2.  Haldol. and 3.  Zyprexa.  Recommended Plan for Multiple Antipsychotic Therapies: NA  Discharge Instructions    Diet - low sodium heart healthy   Complete by: As directed    Diet - low sodium heart healthy   Complete by: As directed    Increase activity slowly   Complete by: As directed    Increase activity slowly   Complete by: As directed      Allergies as of 06/26/2019      Reactions   Aspirin Other (See Comments)   Liver problems   Roxicodone [oxycodone Hcl] Itching      Medication List    STOP taking these medications   cetirizine 10 MG tablet Commonly known as: ZYRTEC   diclofenac Sodium 1 % Gel Commonly known as: Voltaren   Fish Oil 1000 MG Caps   FLUoxetine 20 MG capsule Commonly known as: PROZAC   fluticasone 50 MCG/ACT nasal spray Commonly known as: FLONASE   megestrol 625 MG/5ML suspension Commonly known as: MEGACE ES   methocarbamol 500 MG tablet Commonly known as: ROBAXIN   Multi-Vitamins Tabs   PEG 3350 17 g Pack   predniSONE 10 MG (21) Tbpk tablet Commonly known as: STERAPRED UNI-PAK 21 TAB   Psyllium 48.57 % Powd   senna 8.6 MG Tabs tablet Commonly known as: SENOKOT   Tab-A-Vite Tabs   vitamin B-12 50 MCG tablet Commonly known as: CYANOCOBALAMIN   vitamin C 500  MG tablet Commonly known as: ASCORBIC ACID     TAKE these medications     Indication  albuterol 108 (90 Base) MCG/ACT inhaler Commonly known as: VENTOLIN HFA Inhale 2 puffs  into the lungs every 6 (six) hours as needed for wheezing or shortness of breath.  Indication: Asthma   amLODipine 10 MG tablet Commonly known as: NORVASC Take 1 tablet (10 mg total) by mouth daily. What changed: when to take this  Indication: High Blood Pressure Disorder   bictegravir-emtricitabine-tenofovir AF 50-200-25 MG Tabs tablet Commonly known as: BIKTARVY Take 1 tablet by mouth daily.  Indication: HIV Disease   docusate sodium 100 MG capsule Commonly known as: Colace Take 1 capsule (100 mg total) by mouth 2 (two) times daily.  Indication: Constipation   loratadine 10 MG tablet Commonly known as: CLARITIN Take 1 tablet (10 mg total) by mouth daily.  Indication: Runny Nose   pantoprazole 40 MG tablet Commonly known as: PROTONIX Take 1 tablet (40 mg total) by mouth daily. What changed: when to take this  Indication: Gastroesophageal Reflux Disease   QUEtiapine 300 MG tablet Commonly known as: SEROQUEL Take 1 tablet (300 mg total) by mouth at bedtime.  Indication: Manic Phase of Manic-Depression, Schizophrenia   risperiDONE 0.5 MG tablet Commonly known as: RISPERDAL Take 1 tablet (0.5 mg total) by mouth 2 (two) times daily.  Indication: Delusions      Follow-up Information    [facility] Follow up on 06/30/2019.   Why: Appointment is scheduled for 06/30/2019 at 11AM.  Appointment is in person.  Thanks! Contact information: [place] [address] [phone]           Follow-up recommendations:  Activity:  Activity as tolerated Diet:  Regular diet Other:  [facility]  Comments: Prescriptions provided at discharge  Signed: Alethia Berthold, MD 06/26/2019, 9:49 AM

## 2019-06-26 NOTE — Progress Notes (Signed)
Recreation Therapy Notes   Date: 06/26/2019  Time: 9:30 am  Location: Outside  Behavioral response: Appropriate  Group Type: Leisure  Participation level: Active  Communication: Patient was social with peers and staff.  Comments: N/A  Karle Desrosier LRT/CTRS        Ireanna Finlayson 06/26/2019 12:37 PM

## 2019-06-26 NOTE — Progress Notes (Signed)
Patient has been medication compliant. Continues to show staff her belly and insists she is pregnant. Denies SI, HI and AVH.

## 2019-06-26 NOTE — [facility] Suicide Risk Assessment (Signed)
[facility] Discharge Suicide Risk Assessment   Principal Problem: Bipolar affective [facility]) Discharge Diagnoses: Principal Problem:   Bipolar affective ([place]) Active Problems:   Human immunodeficiency virus (HIV) disease ([place])   Hepatitis C virus infection without hepatic coma   Hypertension associated with diabetes ([place])   Total Time spent with patient: 30 minutes  Musculoskeletal: Strength & Muscle Tone: within normal limits Gait & Station: normal Patient leans: N/A  Psychiatric Specialty Exam: Review of Systems  Constitutional: Negative.   HENT: Negative.   Eyes: Negative.   Respiratory: Negative.   Cardiovascular: Negative.   Gastrointestinal: Negative.   Musculoskeletal: Negative.   Skin: Negative.   Neurological: Negative.   Psychiatric/Behavioral: Negative.     Blood pressure 116/80, pulse 72, temperature 98.7 F (37.1 C), temperature source Oral, resp. rate 17, height 4\' 11"  (1.499 m), weight 68 kg, SpO2 100 %.Body mass index is 30.28 kg/m.  General Appearance: Casual  Eye Contact::  Good  Speech:  Clear and [id number]  Volume:  Normal  Mood:  Euthymic  Affect:  Congruent  Thought Process:  Goal Directed  Orientation:  Full (Time, Place, and Person)  Thought Content:  Logical  Suicidal Thoughts:  No  Homicidal Thoughts:  No  Memory:  Immediate;   Fair Recent;   Fair Remote;   Fair  Judgement:  Fair  Insight:  Fair  Psychomotor Activity:  Normal  Concentration:  Fair  Recall:  [employer] of Knowledge:Fair  Language: Fair  Akathisia:  No  Handed:  Right  AIMS (if indicated):     Assets:  Desire for Improvement Housing Physical Health Social Support  Sleep:  Number of Hours: 6.15  Cognition: WNL  ADL's:  Intact   Mental Status Per Nursing Assessment::   On Admission:  NA  Demographic Factors:  Unemployed  Loss Factors: Financial problems/change in socioeconomic status  Historical Factors: Impulsivity  Risk Reduction Factors:   Responsible  for children under 69 years of age, Sense of responsibility to family and Positive social support  Continued Clinical Symptoms:  Bipolar Disorder:   Mixed State  Cognitive Features That Contribute To Risk:  None    Suicide Risk:  Minimal: No identifiable suicidal ideation.  Patients presenting with no risk factors but with morbid ruminations; may be classified as minimal risk based on the severity of the depressive symptoms  Follow-up Information    [facility] Follow up on 06/30/2019.   Why: Appointment is scheduled for 06/30/2019 at 11AM.  Appointment is in person.  Thanks! Contact information: [place] [address] [phone]           Plan Of Care/Follow-up recommendations:  Activity:  Activity as tolerated Diet:  Regular diet Other:  [facility] continue current medication  Alethia Berthold, MD 06/26/2019, 9:45 AM

## 2019-06-26 NOTE — Progress Notes (Signed)
Recreation Therapy Notes  INPATIENT RECREATION TR PLAN  Patient Details Name: Ashley [name] MRN: [id number] DOB: Jul 24, 1965 Today's Date: 06/26/2019  Rec Therapy Plan Is patient appropriate for Therapeutic Recreation?: Yes Treatment times per week: at least 3 Estimated Length of Stay: 5-7 days TR Treatment/Interventions: Group participation (Comment)  Discharge Criteria Pt will be discharged from therapy if:: Discharged Treatment plan/goals/alternatives discussed and agreed upon by:: Patient/family  Discharge Summary Short term goals set: Patient will focus on task/topic with 2 prompts from staff within 5 recreation therapy group sessions Short term goals met: Complete Progress toward goals comments: Groups attended Which groups?: Leisure education, Communication, Other (Comment)(Happiness) Reason goals not met: N/A Therapeutic equipment acquired: N/A Reason patient discharged from therapy: Discharge from hospital Pt/family agrees with progress & goals achieved: Yes Date patient discharged from therapy: 06/26/19   [name] 06/26/2019, 12:41 PM

## 2019-06-26 NOTE — Progress Notes (Signed)
  [facility] Adult Case Management Discharge Plan :  Will you be returning to the same living situation after discharge:  Yes,  pt reports that she is returning home. At discharge, do you have transportation home?: Yes,  pt reports family friend will provide transportation.  Do you have the ability to pay for your medications: Yes,  [place] [employer]  Release of information consent forms completed and in the chart;  Patient's signature needed at discharge.  Patient to Follow up at: Follow-up Information    [facility], [facility] Follow up on 06/30/2019.   Why: Appointment is scheduled for 06/30/2019 at 11AM.  Appointment is in person.  Thanks! Contact information: [place] [address] [phone]           Next level of care provider has access to [place] and Suicide Prevention discussed: Yes,  SPE completed with the patient and patient's son.  Have you used any form of tobacco in the last 30 days? (Cigarettes, Smokeless Tobacco, Cigars, and/or Pipes): Yes  Has patient been referred to the Quitline?: Patient refused referral  Patient has been referred for addiction treatment: Pt. refused referral  Rozann Lesches, LCSW 06/26/2019, 11:16 AM

## 2019-06-28 ENCOUNTER — Other Ambulatory Visit: Payer: Self-pay | Admitting: Psychiatry

## 2019-07-04 ENCOUNTER — Telehealth: Payer: Self-pay | Admitting: Family Medicine

## 2019-07-04 NOTE — Telephone Encounter (Signed)
Patient aware we can not see these symptoms in office she will have to go to urgent care if she wants to be seen in person

## 2019-07-09 ENCOUNTER — Other Ambulatory Visit: Payer: Self-pay | Admitting: Family Medicine

## 2019-07-09 ENCOUNTER — Other Ambulatory Visit: Payer: Self-pay | Admitting: Internal Medicine

## 2019-07-09 DIAGNOSIS — J439 Emphysema, unspecified: Secondary | ICD-10-CM

## 2019-07-10 ENCOUNTER — Telehealth: Payer: Self-pay

## 2019-07-10 NOTE — Telephone Encounter (Signed)
COVID-19 Pre-Screening Questions:07/10/19  Do you currently have a fever (>100 F), chills or unexplained body aches? NO   Are you currently experiencing new cough, shortness of breath, sore throat, runny nose? NO .  Have you recently travelled outside the state of [place] in the last 14 days? NO  .  Have you been in contact with someone that is currently pending confirmation of Covid19 testing or has been confirmed to have the [place] virus? NO   **If the patient answers NO to ALL questions -  advise the patient to please call the clinic before coming to the office should any symptoms develop.

## 2019-07-11 ENCOUNTER — Other Ambulatory Visit: Payer: Self-pay

## 2019-07-11 ENCOUNTER — Other Ambulatory Visit: Payer: Medicaid Other

## 2019-07-11 DIAGNOSIS — Z113 Encounter for screening for infections with a predominantly sexual mode of transmission: Secondary | ICD-10-CM

## 2019-07-11 DIAGNOSIS — B20 Human immunodeficiency virus [HIV] disease: Secondary | ICD-10-CM

## 2019-07-11 LAB — T-HELPER CELL (CD4) - (RCID CLINIC ONLY)
CD4 % Helper T Cell: 36 % (ref 33–65)
CD4 T Cell Abs: 790 /uL (ref 400–1790)

## 2019-07-14 LAB — HIV-1 RNA QUANT-NO REFLEX-BLD
HIV 1 RNA Quant: <20 copies/mL
HIV-1 RNA Quant, Log: <1.3 Log copies/mL

## 2019-07-14 LAB — RPR: RPR Ser Ql: NONREACTIVE

## 2019-07-22 ENCOUNTER — Telehealth: Payer: Self-pay

## 2019-07-22 NOTE — Telephone Encounter (Signed)
COVID-19 Pre-Screening Questions:07/22/19  Do you currently have a fever (>100 F), chills or unexplained body aches? NO  Are you currently experiencing new cough, shortness of breath, sore throat, runny nose? NO .  Have you recently travelled outside the state of [place] in the last 14 days? NO .  Have you been in contact with someone that is currently pending confirmation of Covid19 testing or has been confirmed to have the Covid19 virus? NO  **If the patient answers NO to ALL questions -  advise the patient to please call the clinic before coming to the office should any symptoms develop.     

## 2019-07-23 ENCOUNTER — Encounter: Payer: Self-pay | Admitting: Internal Medicine

## 2019-07-23 ENCOUNTER — Ambulatory Visit (INDEPENDENT_AMBULATORY_CARE_PROVIDER_SITE_OTHER): Payer: Medicaid Other | Admitting: Internal Medicine

## 2019-07-23 ENCOUNTER — Other Ambulatory Visit: Payer: Self-pay

## 2019-07-23 VITALS — BP 107/74 | HR 73 | Temp 98.0°F | Ht <= 58 in | Wt 125.0 lb

## 2019-07-23 DIAGNOSIS — Z113 Encounter for screening for infections with a predominantly sexual mode of transmission: Secondary | ICD-10-CM | POA: Diagnosis not present

## 2019-07-23 DIAGNOSIS — F329 Major depressive disorder, single episode, unspecified: Secondary | ICD-10-CM | POA: Diagnosis not present

## 2019-07-23 DIAGNOSIS — B20 Human immunodeficiency virus [HIV] disease: Secondary | ICD-10-CM

## 2019-07-23 DIAGNOSIS — F32A Depression, unspecified: Secondary | ICD-10-CM

## 2019-07-23 NOTE — Assessment & Plan Note (Signed)
Screened negative 

## 2019-07-23 NOTE — Assessment & Plan Note (Signed)
Doing well on the medication and good compliance.  Continue with Biktarvy and rtc in 6 months.

## 2019-07-23 NOTE — Progress Notes (Signed)
   Subjective:    Patient ID: Ashley [name], female    DOB: Feb 06, 1966, 54 y.o.   MRN: [id number]  HPI Here for follow up of HIV She restarted her Biktarvy last year and has been taking it.  No new issues with her medication and CD4 790 and viral load < 20.  RPR NR.   She is depressed and tearful today. No weight loss, eating well.      Review of Systems  Constitutional: Negative for fatigue.  Gastrointestinal: Negative for diarrhea and nausea.  Neurological: Negative for dizziness.       Objective:   Physical Exam Constitutional:      Appearance: Normal appearance.  Eyes:     General: No scleral icterus. Cardiovascular:     Rate and Rhythm: Normal rate and regular rhythm.  Skin:    Findings: No rash.  Neurological:     General: No focal deficit present.     Mental Status: She is alert.  Psychiatric:        Mood and Affect: Mood normal.   SH: + tobacco       Assessment & Plan:

## 2019-07-23 NOTE — Assessment & Plan Note (Signed)
Currently with depression and feels 'lost'.  Today is the birthday of her mother who died about 7 years ago and is experiencing grief.  She will schedule with our counselor.

## 2019-07-29 ENCOUNTER — Ambulatory Visit: Payer: Medicaid Other

## 2019-07-31 ENCOUNTER — Other Ambulatory Visit: Payer: Self-pay | Admitting: Internal Medicine

## 2019-08-14 ENCOUNTER — Telehealth: Payer: Self-pay | Admitting: Family Medicine

## 2019-08-14 ENCOUNTER — Other Ambulatory Visit: Payer: Self-pay | Admitting: Family Medicine

## 2019-08-14 DIAGNOSIS — K439 Ventral hernia without obstruction or gangrene: Secondary | ICD-10-CM

## 2019-08-14 DIAGNOSIS — Z9889 Other specified postprocedural states: Secondary | ICD-10-CM

## 2019-08-14 NOTE — Telephone Encounter (Signed)
dpne

## 2019-08-14 NOTE — Telephone Encounter (Signed)
  REFERRAL REQUEST Telephone Note 08/14/2019  What type of referral do you need? Dr. Ericka Pontiff at [place].   Have you been seen at our office for this problem? She recently hernia surgery is having pains was told by Dr. Constance Haw office to contact PCP to get another referral  (Advise that they may need an appointment with their PCP before a referral can be done)  Is there a particular doctor or location that you prefer? Dr. Constance Haw at [place].  Patient notified that referrals can take up to a week or longer to process. If they haven't heard anything within a week they should call back and speak with the referral department.

## 2019-08-19 ENCOUNTER — Encounter: Payer: Self-pay | Admitting: Family Medicine

## 2019-09-09 ENCOUNTER — Ambulatory Visit (INDEPENDENT_AMBULATORY_CARE_PROVIDER_SITE_OTHER): Payer: Medicaid Other | Admitting: General Surgery

## 2019-09-09 ENCOUNTER — Other Ambulatory Visit: Payer: Self-pay

## 2019-09-09 ENCOUNTER — Encounter: Payer: Self-pay | Admitting: General Surgery

## 2019-09-09 VITALS — BP 111/75 | HR 140 | Temp 98.3°F | Resp 14 | Ht 59.5 in | Wt 122.0 lb

## 2019-09-09 DIAGNOSIS — M6208 Separation of muscle (nontraumatic), other site: Secondary | ICD-10-CM | POA: Diagnosis not present

## 2019-09-09 DIAGNOSIS — R1013 Epigastric pain: Secondary | ICD-10-CM | POA: Diagnosis not present

## 2019-09-09 NOTE — Patient Instructions (Signed)
Take your protonix 40 daily as prescribed. You can take some Maalox over the counter and see if this helps with pain/ soothing the stomach. Try smaller meals more frequently to see if that helps with the pain, nausea/vomiting.  Referral to Dr. Laural Golden.    Upper Endoscopy, Adult Upper endoscopy is a procedure to look inside the upper GI (gastrointestinal) tract. The upper GI tract is made up of:  The part of the body that moves food from your mouth to your stomach (esophagus).  The stomach.  The first part of your small intestine (duodenum). This procedure is also called esophagogastroduodenoscopy (EGD) or gastroscopy. In this procedure, your health care provider passes a thin, flexible tube (endoscope) through your mouth and down your esophagus into your stomach. A small camera is attached to the end of the tube. Images from the camera appear on a monitor in the exam room. During this procedure, your health care provider may also remove a small piece of tissue to be sent to a lab and examined under a microscope (biopsy). Your health care provider may do an upper endoscopy to diagnose cancers of the upper GI tract. You may also have this procedure to find the cause of other conditions, such as:  Stomach pain.  Heartburn.  Pain or problems when swallowing.  Nausea and vomiting.  Stomach bleeding.  Stomach ulcers. Tell a health care provider about:  Any allergies you have.  All medicines you are taking, including vitamins, herbs, eye drops, creams, and over-the-counter medicines.  Any problems you or family members have had with anesthetic medicines.  Any blood disorders you have.  Any surgeries you have had.  Any medical conditions you have.  Whether you are pregnant or may be pregnant. What are the risks? Generally, this is a safe procedure. However, problems may occur, including:  Infection.  Bleeding.  Allergic reactions to medicines.  A tear or hole (perforation)  in the esophagus, stomach, or duodenum. Medicines Ask your health care provider about:  Changing or stopping your regular medicines. This is especially important if you are taking diabetes medicines or blood thinners.  Taking medicines such as aspirin and ibuprofen. These medicines can thin your blood. Do not take these medicines unless your health care provider tells you to take them.  Taking over-the-counter medicines, vitamins, herbs, and supplements. General instructions  Plan to have someone take you home from the hospital or clinic.  If you will be going home right after the procedure, plan to have someone with you for 24 hours.  Ask your health care provider what steps will be taken to help prevent infection. What happens during the procedure?   An IV will be inserted into one of your veins.  You may be given one or more of the following: ? A medicine to help you relax (sedative). ? A medicine to numb the throat (local anesthetic).  You will lie on your left side on an exam table.  Your health care provider will pass the endoscope through your mouth and down your esophagus.  Your health care provider will use the scope to check the inside of your esophagus, stomach, and duodenum. Biopsies may be taken.  The endoscope will be removed. The procedure may vary among health care providers and hospitals. What happens after the procedure?  Your blood pressure, heart rate, breathing rate, and blood oxygen level will be monitored until you leave the hospital or clinic.  Do not drive for 24 hours if you were given  a sedative during your procedure.  When your throat is no longer numb, you may be given some fluids to drink.  It is up to you to get the results of your procedure. Ask your health care provider, or the department that is doing the procedure, when your results will be ready. Summary  Upper endoscopy is a procedure to look inside the upper GI tract.  During the  procedure, an IV will be inserted into one of your veins. You may be given a medicine to help you relax.  A medicine will be used to numb your throat.  The endoscope will be passed through your mouth and down your esophagus. This information is not intended to replace advice given to you by your health care provider. Make sure you discuss any questions you have with your health care provider. Document Revised: 08/08/2017 Document Reviewed: 07/16/2017 Elsevier Patient Education  [place].

## 2019-09-09 NOTE — Progress Notes (Addendum)
[facility] Note   HPI:  54 y.o. Female presents to clinic for reported upper right upper quadrant pain and nausea and vomiting. She had a hernia repair with mesh in 2020 for a recurrent incisional hernia and has known diastasis recti in addition to the prior hernia.  She had a CT April without evidence of hernia recurrence. She has had a prior cholecystectomy.  She has diabetes and reports that she has pain after bigger or greasy meals. She does suffer from reflux and takes protonix at night. She says that she vomits up gastric contents/ frothy spit like reflux.    She says that this occurs a few times a week but not daily and right now she cannot pin point a cause. She was referred back to me for possible hernia.  She has not had an Endoscopy or had further workup.   Review of Systems:  Vomiting, nausea Upper abdominal pain. Mostly right side  All other review of systems: otherwise negative   Vital Signs:  BP 111/75   Pulse (!) 140   Temp 98.3 F (36.8 C) (Oral)   Resp 14   Ht 4' 11.5" (1.511 m)   Wt 122 lb (55.3 kg)   SpO2 95%   BMI 24.23 kg/m    Repeat HR 74 on my exam.   Physical Exam:  Physical Exam Vitals reviewed.  HENT:     Head: Normocephalic.     Nose: Nose normal.  Cardiovascular:     Rate and Rhythm: Normal rate.  Pulmonary:     Effort: Pulmonary effort is normal.  Abdominal:     General: There is no distension.     Palpations: Abdomen is soft.     Tenderness: There is no abdominal tenderness.     Hernia: No hernia is present.     Comments: Diastasis recti of upper abdomen, hernia intact, mesh palpated, no recurrence noted with valsalva   Skin:    General: Skin is warm.  Neurological:     General: No focal deficit present.     Mental Status: She is alert.  Psychiatric:        Mood and Affect: Mood normal.        Behavior: Behavior normal.    CT - reviewed and no recurrence noted with hernia and diastasis noted   Assessment:   54 y.o. yo Female with upper abdominal pain and nausea/vomiting. She has no hernia recurrent on CT and on exam. Mesh feels intact but is palpable and there is diastasis. She could have gastritis versus gastroparesis.   Plan:  Recommended smaller more frequent meals Protonix to continue, Maalox over the counter could help with gastritis  Referral to Dr. Laural Golden for possible EGD and further workup for gastroparesis etc if needed    PRN follow up  Curlene Labrum, MD [facility] [address] [place], [place] [address] [phone] (office)

## 2019-09-10 ENCOUNTER — Encounter (INDEPENDENT_AMBULATORY_CARE_PROVIDER_SITE_OTHER): Payer: Self-pay | Admitting: Gastroenterology

## 2019-09-16 NOTE — Progress Notes (Signed)
Please try to bring patient earlier than October 30, 2019.

## 2019-09-22 ENCOUNTER — Encounter (INDEPENDENT_AMBULATORY_CARE_PROVIDER_SITE_OTHER): Payer: Self-pay | Admitting: Gastroenterology

## 2019-09-22 ENCOUNTER — Other Ambulatory Visit: Payer: Self-pay

## 2019-09-22 ENCOUNTER — Ambulatory Visit (INDEPENDENT_AMBULATORY_CARE_PROVIDER_SITE_OTHER): Payer: Medicaid Other | Admitting: Gastroenterology

## 2019-09-22 VITALS — BP 124/84 | HR 71 | Temp 98.5°F | Ht 59.5 in | Wt 119.1 lb

## 2019-09-22 DIAGNOSIS — R1013 Epigastric pain: Secondary | ICD-10-CM

## 2019-09-22 DIAGNOSIS — G8929 Other chronic pain: Secondary | ICD-10-CM

## 2019-09-22 DIAGNOSIS — R110 Nausea: Secondary | ICD-10-CM | POA: Diagnosis not present

## 2019-09-22 MED ORDER — PANTOPRAZOLE SODIUM 40 MG PO TBEC
40.0000 mg | DELAYED_RELEASE_TABLET | Freq: Two times a day (BID) | ORAL | 3 refills | Status: DC
Start: 2019-09-22 — End: 2020-07-05

## 2019-09-22 NOTE — Progress Notes (Signed)
Patient profile: Ashley [name] is a 54 y.o. female seen for evaluation of upper abdominal pain.  History of Present Illness: Ashley [name] is seen today for abdominal pain. Recently seen by Dr Constance Haw of sugery for upper abd pain and nausea/vomiting. PMhx of repair of recurrent incisional hernia w/ mesh 09/2018.   Reports upper abdominal pain started about 1 month ago, she is currently having pain most days. Tends to be worse in RUQ but can occur in LUQ as well. Described as burning, associated w/ poor appetite over past month. Reports nausea/vomiting over 2 weeks-vomiting average 2x/week. Eating makes pain worse and also endorses significant early satiety. Has lost about 6 lbs as below due to pain. She takes protonix 40mg  once a day and still feels some GERD sx. she denies any esophageal dysphagia.  She reports a poor appetite.  No lower GI complaints-having a BM about every day. Denies blood in stool or melena. No c/d. Denies lower abd pain.   Smokes 1-1.5 PPD. No nsaids. No alcohol.   Helps care for her 2 grandchildren who are both under 17 years old.   Wt Readings from Last 3 Encounters:  09/22/19 119 lb 1.6 oz (54 kg)  09/09/19 122 lb (55.3 kg)  07/23/19 125 lb (56.7 kg)     Last Colonoscopy: 04/2018 - Decreased sphincter tone found on digital rectal exam.                           - Three small polyps in the transverse colon.                            Biopsied.                           - External hemorrhoids -Patient had 3 small polyps removed and there were benign polypoid colonic tissue. Results given to patient. Next colonoscopy in 10 years.   Past Medical History:  Past Medical History:  Diagnosis Date  . Ankle fracture   . Anxiety   . Asthma   . Collagen vascular disease ([place])   . COPD (chronic obstructive pulmonary disease) ([place])   . Depression   . Diabetes mellitus   . Elevated LFTs 01/13/2012  . Hepatitis C   . Hernia   . HIV (human immunodeficiency virus  infection) ([place])   . HIV (human immunodeficiency virus infection) ([place])   . Hypertension   . Hypothyroidism   . Pinched nerve   . Scoliosis   . TB (tuberculosis)     Problem List: Patient Active Problem List   Diagnosis Date Noted  . Abdominal pain, epigastric 09/09/2019  . Routine screening for STI (sexually transmitted infection) 07/23/2019  . Bipolar affective ([place]) 06/19/2019  . Loss of weight 12/09/2018  . Recurrent incisional hernia 09/24/2018  . Rectus diastasis of lower abdomen 09/24/2018  . Rectal bleeding 04/09/2018  . Depressive disorder 10/17/2017  . Umbilical hernia without obstruction and without gangrene 12/11/2016  . Pulmonary emphysema ([place]) 12/17/2015  . Liver fibrosis 11/11/2015  . Encounter for long-term (current) use of medications 06/24/2015  . Substance abuse ([place]) 06/24/2015  . Atypical squamous cells of undetermined significance (ASCUS) on Papanicolaou smear of cervix on 03/26/15 04/02/2015  . 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 Tobacco use 01/13/2012  . Tuberculosis 12/19/2011  . Cigarette smoker  12/19/2011  . Hereditary and idiopathic peripheral neuropathy 06/16/2009  . Hypertension associated with diabetes ([place]) 03/19/2009  . ABDOMINAL WALL HERNIA 03/19/2009  . BACK PAIN, CHRONIC 03/19/2009  . Human immunodeficiency virus (HIV) disease ([place]) 03/18/2009  . Hepatitis C virus infection without hepatic coma 03/18/2009  . Type 2 diabetes mellitus ([place]) 03/18/2009  . Schizophrenia ([place]) 03/18/2009  . BIPOLAR AFFECTIVE DISORDER, DEPRESSED, SEVERE 03/18/2009    Past Surgical History: Past Surgical History:  Procedure Laterality Date  . BIOPSY  05/08/2018   Procedure: biopsy;  Surgeon: Rogene Houston, MD;  Location: [facility];  Service: Endoscopy;;  proximal transverse colon polyp  . CESAREAN SECTION    . CHOLECYSTECTOMY    . COLONOSCOPY N/A 05/08/2018   Procedure: COLONOSCOPY;  Surgeon: Rogene Houston, MD;  Location: [facility];  Service: Endoscopy;  Laterality: N/A;  2:00  . HERNIA REPAIR    . INCISIONAL HERNIA REPAIR N/A 09/30/2018   Procedure: OPEN HERNIA REPAIR INCISIONAL WITH MESH;  Surgeon: Virl Cagey, MD;  Location: [facility] ORS;  Service: General;  Laterality: N/A;  . LIVER BIOPSY      Allergies: Allergies  Allergen Reactions  . Aspirin Other (See Comments)    Liver problems  . Roxicodone [Oxycodone Hcl] Itching  . Tomato       Home Medications:  Current Outpatient Medications:  .  albuterol (VENTOLIN HFA) 108 (90 Base) MCG/ACT inhaler, Inhale 2 puffs into the lungs every 6 (six) hours as needed for wheezing or shortness of breath., Disp: 18 g, Rfl: 1 .  amLODipine (NORVASC) 10 MG tablet, TAKE 1 TABLET BY MOUTH EVERY DAY, Disp: 90 tablet, Rfl: 0 .  BIKTARVY 50-200-25 MG TABS tablet, TAKE ONE TABLET BY MOUTH EVERY DAY, Disp: 30 tablet, Rfl: 2 .  docusate sodium (COLACE) 100 MG capsule, Take 1 capsule (100 mg total) by mouth 2 (two) times daily., Disp: 60 capsule, Rfl: 2 .  loratadine (CLARITIN) 10 MG tablet, Take 1 tablet (10 mg total) by mouth daily., Disp: 30 tablet, Rfl: 1 .  QUEtiapine (SEROQUEL) 300 MG tablet, Take 1 tablet (300 mg total) by mouth at bedtime., Disp: 30 tablet, Rfl: 1 .  risperiDONE (RISPERDAL) 0.5 MG tablet, Take 1 tablet (0.5 mg total) by mouth 2 (two) times daily., Disp: 60 tablet, Rfl: 1 .  pantoprazole (PROTONIX) 40 MG tablet, Take 1 tablet (40 mg total) by mouth 2 (two) times daily before a meal., Disp: 60 tablet, Rfl: 3   Family History: family history includes Cancer in her maternal aunt and sister; Diabetes in her mother; Hypertension in her mother; Stroke in her mother.    Social History:   reports that she has been smoking cigarettes. She has a 35.00 pack-year smoking history. She has never used smokeless tobacco. She reports previous alcohol use of about 3.0 standard drinks of alcohol per week. She reports previous drug use.  Drugs: Marijuana and Cocaine.   Review of Systems: Constitutional: Denies weight loss/weight gain  Eyes: No changes in vision. ENT: No oral lesions, sore throat.  GI: see HPI.  Heme/Lymph: No easy bruising.  CV: No chest pain.  GU: No hematuria.  Integumentary: No rashes.  Neuro: No headaches.  Psych: No depression/anxiety.  Endocrine: No heat/cold intolerance.  Allergic/Immunologic: No urticaria.  Resp: No cough, SOB.  Musculoskeletal: No joint swelling.    Physical Examination: BP 124/84 (BP Location: Right Arm, Patient Position: Sitting, Cuff Size: Normal)   Pulse 71   Temp 98.5 F (36.9  C) (Oral)   Ht 4' 11.5" (1.511 m)   Wt 119 lb 1.6 oz (54 kg)   BMI 23.65 kg/m  Gen: NAD, alert and oriented x 4 HEENT: PEERLA, EOMI, Neck: supple, no JVD Chest: CTA bilaterally, no wheezes, crackles, or other adventitious sounds CV: RRR, no m/g/c/r Abd: soft, NT, ND, +BS in all four quadrants; no HSM, guarding, ridigity, or rebound tenderness Ext: no edema, well perfused with 2+ pulses, Skin: no rash or lesions noted on observed skin Lymph: no noted LAD  Data Reviewed:  05/2019-IMPRESSION: Ct a/p  1. No acute abdominal/pelvic findings, mass lesions or adenopathy. 2. Status post cholecystectomy. No biliary dilatation. 3. Mild renal cortical scarring changes bilaterally, right greater than left. 4. No acute bony findings.  05/2019-labs show AST 73, ALT 70, potassium 3.2, glucose 125, CBC normal, lipase normal   Assessment/Plan: Ms. Risk is a 54 y.o. female with past medical history of hepatitis C (s/p treatment), HIV, anxiety, diabetes seen for evaluation of upper abdominal pain.   1. RUQ pain/eg pain- CT as above from April 2021 overall unremarkable.  She did have elevated liver enzymes and these will be repeated today.  She denies alcohol use.  She is on Protonix 40 mg once a day, suspect she may have some underlying gastritis or peptic ulcer disease, will increase her PPI to  twice a day schedule endoscopy for evaluation.  She does have unintentional weight loss associated symptoms.  She denies prior issues with sedation.  2. CRC screening - UTD with colonoscopy 2020, due for repeat 2030.   3. Elevated LFTS - noted from 05/2018, recheck today. If have elevated further will need additional serologic evaluation.   Diagnoses and all orders for this visit:  Abdominal pain, chronic, epigastric -     CBC with Differential -     COMPLETE METABOLIC PANEL WITH GFR -     Lipase  Nausea without vomiting -     CBC with Differential -     COMPLETE METABOLIC PANEL WITH GFR -     Lipase  Other orders -     pantoprazole (PROTONIX) 40 MG tablet; Take 1 tablet (40 mg total) by mouth 2 (two) times daily before a meal.    Patient denies CP, SOB, and use of blood thinners. I discussed the risks and benefits of procedure including bleeding, perforation, infection, missed lesions, medication reactions and possible hospitalization or surgery if complications. All questions answered.    I personally performed the service, non-incident to. (WP)  Laurine Blazer, [facility]

## 2019-09-22 NOTE — Patient Instructions (Signed)
We are checking labs today and scheduling endoscopy for evaluation  - increase protonix to twice a day (30 min before breakfast and supper). Call if no better in 2 weeks.

## 2019-09-23 ENCOUNTER — Encounter (INDEPENDENT_AMBULATORY_CARE_PROVIDER_SITE_OTHER): Payer: Self-pay | Admitting: *Deleted

## 2019-09-23 ENCOUNTER — Other Ambulatory Visit (INDEPENDENT_AMBULATORY_CARE_PROVIDER_SITE_OTHER): Payer: Self-pay | Admitting: *Deleted

## 2019-09-23 LAB — CBC WITH DIFFERENTIAL/PLATELET
Absolute Monocytes: 670 cells/uL (ref 200–950)
Basophils Absolute: 29 cells/uL (ref 0–200)
Basophils Relative: 0.4 %
Eosinophils Absolute: 79 cells/uL (ref 15–500)
Eosinophils Relative: 1.1 %
HCT: 43 % (ref 35.0–45.0)
Hemoglobin: 14.8 g/dL (ref 11.7–15.5)
Lymphs Abs: 3355 cells/uL (ref 850–3900)
MCH: 33.9 pg — ABNORMAL HIGH (ref 27.0–33.0)
MCHC: 34.4 g/dL (ref 32.0–36.0)
MCV: 98.6 fL (ref 80.0–100.0)
MPV: 10.7 fL (ref 7.5–12.5)
Monocytes Relative: 9.3 %
Neutro Abs: 3067 cells/uL (ref 1500–7800)
Neutrophils Relative %: 42.6 %
Platelets: 180 10*3/uL (ref 140–400)
RBC: 4.36 10*6/uL (ref 3.80–5.10)
RDW: 13.1 % (ref 11.0–15.0)
Total Lymphocyte: 46.6 %
WBC: 7.2 10*3/uL (ref 3.8–10.8)

## 2019-09-23 LAB — COMPLETE METABOLIC PANEL WITH GFR
AG Ratio: 1.6 (calc) (ref 1.0–2.5)
ALT: 28 U/L (ref 6–29)
AST: 44 U/L — ABNORMAL HIGH (ref 10–35)
Albumin: 5.1 g/dL (ref 3.6–5.1)
Alkaline phosphatase (APISO): 84 U/L (ref 37–153)
BUN/Creatinine Ratio: 11 (calc) (ref 6–22)
BUN: 13 mg/dL (ref 7–25)
CO2: 27 mmol/L (ref 20–32)
Calcium: 10.5 mg/dL — ABNORMAL HIGH (ref 8.6–10.4)
Chloride: 104 mmol/L (ref 98–110)
Creat: 1.2 mg/dL — ABNORMAL HIGH (ref 0.50–1.05)
GFR, Est African American: 59 mL/min/{1.73_m2} — ABNORMAL LOW (ref >=60)
GFR, Est Non African American: 50 mL/min/{1.73_m2} — ABNORMAL LOW (ref >=60)
Globulin: 3.2 g/dL (calc) (ref 1.9–3.7)
Glucose, Bld: 76 mg/dL (ref 65–99)
Potassium: 4.5 mmol/L (ref 3.5–5.3)
Sodium: 139 mmol/L (ref 135–146)
Total Bilirubin: 0.6 mg/dL (ref 0.2–1.2)
Total Protein: 8.3 g/dL — ABNORMAL HIGH (ref 6.1–8.1)

## 2019-09-23 LAB — LIPASE: Lipase: 40 U/L (ref 7–60)

## 2019-09-29 ENCOUNTER — Other Ambulatory Visit: Payer: Self-pay | Admitting: Family Medicine

## 2019-09-29 DIAGNOSIS — J439 Emphysema, unspecified: Secondary | ICD-10-CM

## 2019-10-14 ENCOUNTER — Other Ambulatory Visit: Payer: Self-pay | Admitting: Family Medicine

## 2019-10-14 DIAGNOSIS — J439 Emphysema, unspecified: Secondary | ICD-10-CM

## 2019-10-14 MED ORDER — BIKTARVY 50-200-25 MG PO TABS: 1.0000 | ORAL_TABLET | Freq: Every day | ORAL | 1 refills | Status: DC

## 2019-10-14 MED ORDER — ALBUTEROL SULFATE HFA 108 (90 BASE) MCG/ACT IN AERS: 2.0000 | INHALATION_SPRAY | Freq: Four times a day (QID) | RESPIRATORY_TRACT | 0 refills | Status: DC | PRN

## 2019-10-14 NOTE — Telephone Encounter (Signed)
Appt made for 12/03/19. Rf sent to pharmacy

## 2019-10-14 NOTE — Telephone Encounter (Signed)
  Prescription Request  10/14/2019  What is the name of the medication or equipment? Inhaler in multi-vitamin  Have you contacted your pharmacy to request a refill? (if applicable) yes  Which pharmacy would you like this sent to? [facility]   Patient notified that their request is being sent to the clinical staff for review and that they should receive a response within 2 business days.

## 2019-10-16 NOTE — Patient Instructions (Signed)
Ashley [name]  10/16/2019     @PREFPERIOPPHARMACY @   Your procedure is scheduled on  10/22/2019.  Report to [facility] at  [place].  Call this number if you have problems the morning of surgery:  [phone]   Remember:  Follow the diet instructions given to you by the office.                       Take these medicines the morning of surgery with A SIP OF WATER  Amlodipine, biltarvy, protonix, risperdal.    Do not wear jewelry, make-up or nail polish.  Do not wear lotions, powders, or perfumes. Please wear deodorant and brush your teeth.  Do not shave 48 hours prior to surgery.  Men may shave face and neck.  Do not bring valuables to the hospital.  [facility] is not responsible for any belongings or valuables.  Contacts, dentures or bridgework may not be worn into surgery.  Leave your suitcase in the car.  After surgery it may be brought to your room.  For patients admitted to the hospital, discharge time will be determined by your treatment team.  Patients discharged the day of surgery will not be allowed to drive home.   Name and phone number of your driver:   family Special instructions:   DO NOT smoke the morning of your procedure.  Please read over the following fact sheets that you were given. Anesthesia Post-op Instructions and Care and Recovery After Surgery       Upper Endoscopy, Adult, Care After This sheet gives you information about how to care for yourself after your procedure. Your health care provider may also give you more specific instructions. If you have problems or questions, contact your health care provider. What can I expect after the procedure? After the procedure, it is common to have:  A sore throat.  Mild stomach pain or discomfort.  Bloating.  Nausea. Follow these instructions at home:   Follow instructions from your health care provider about what to eat or drink after your procedure.  Return to your normal activities as  told by your health care provider. Ask your health care provider what activities are safe for you.  Take over-the-counter and prescription medicines only as told by your health care provider.  Do not drive for 24 hours if you were given a sedative during your procedure.  Keep all follow-up visits as told by your health care provider. This is important. Contact a health care provider if you have:  A sore throat that lasts longer than one day.  Trouble swallowing. Get help right away if:  You vomit blood or your vomit looks like coffee grounds.  You have: ? A fever. ? Bloody, black, or tarry stools. ? A severe sore throat or you cannot swallow. ? Difficulty breathing. ? Severe pain in your chest or abdomen. Summary  After the procedure, it is common to have a sore throat, mild stomach discomfort, bloating, and nausea.  Do not drive for 24 hours if you were given a sedative during the procedure.  Follow instructions from your health care provider about what to eat or drink after your procedure.  Return to your normal activities as told by your health care provider. This information is not intended to replace advice given to you by your health care provider. Make sure you discuss any questions you have with your health care provider. Document Revised: 08/07/2017  Document Reviewed: 07/16/2017 Elsevier Patient Education  [place] After These instructions provide you with information about caring for yourself after your procedure. Your health care provider may also give you more specific instructions. Your treatment has been planned according to current medical practices, but problems sometimes occur. Call your health care provider if you have any problems or questions after your procedure. What can I expect after the procedure? After your procedure, you may:  Feel sleepy for several hours.  Feel clumsy and have poor balance for several  hours.  Feel forgetful about what happened after the procedure.  Have poor judgment for several hours.  Feel nauseous or vomit.  Have a sore throat if you had a breathing tube during the procedure. Follow these instructions at home: For at least 24 hours after the procedure:      Have a responsible adult stay with you. It is important to have someone help care for you until you are awake and alert.  Rest as needed.  Do not: ? Participate in activities in which you could fall or become injured. ? Drive. ? Use heavy machinery. ? Drink alcohol. ? Take sleeping pills or medicines that cause drowsiness. ? Make important decisions or sign legal documents. ? Take care of children on your own. Eating and drinking  Follow the diet that is recommended by your health care provider.  If you vomit, drink water, juice, or soup when you can drink without vomiting.  Make sure you have little or no nausea before eating solid foods. General instructions  Take over-the-counter and prescription medicines only as told by your health care provider.  If you have sleep apnea, surgery and certain medicines can increase your risk for breathing problems. Follow instructions from your health care provider about wearing your sleep device: ? Anytime you are sleeping, including during daytime naps. ? While taking prescription pain medicines, sleeping medicines, or medicines that make you drowsy.  If you smoke, do not smoke without supervision.  Keep all follow-up visits as told by your health care provider. This is important. Contact a health care provider if:  You keep feeling nauseous or you keep vomiting.  You feel light-headed.  You develop a rash.  You have a fever. Get help right away if:  You have trouble breathing. Summary  For several hours after your procedure, you may feel sleepy and have poor judgment.  Have a responsible adult stay with you for at least 24 hours or until  you are awake and alert. This information is not intended to replace advice given to you by your health care provider. Make sure you discuss any questions you have with your health care provider. Document Revised: 05/14/2017 Document Reviewed: 06/06/2015 Elsevier Patient Education  [place].

## 2019-10-20 ENCOUNTER — Other Ambulatory Visit (HOSPITAL_COMMUNITY)
Admission: RE | Admit: 2019-10-20 | Discharge: 2019-10-20 | Disposition: A | Discharge status: Home / Self Care | Payer: Medicaid Other | Source: Ambulatory Visit | Attending: Internal Medicine | Admitting: Internal Medicine

## 2019-10-20 ENCOUNTER — Encounter (HOSPITAL_COMMUNITY)
Admission: RE | Admit: 2019-10-20 | Discharge: 2019-10-20 | Disposition: A | Discharge status: Home / Self Care | Payer: Medicaid Other | Source: Ambulatory Visit | Attending: Internal Medicine | Admitting: Internal Medicine

## 2019-10-20 ENCOUNTER — Other Ambulatory Visit: Payer: Self-pay

## 2019-10-20 ENCOUNTER — Encounter (HOSPITAL_COMMUNITY): Payer: Self-pay

## 2019-10-20 DIAGNOSIS — Z20822 Contact with and (suspected) exposure to covid-19: Secondary | ICD-10-CM | POA: Diagnosis not present

## 2019-10-20 DIAGNOSIS — Z01812 Encounter for preprocedural laboratory examination: Secondary | ICD-10-CM | POA: Insufficient documentation

## 2019-10-20 HISTORY — DX: Gastro-esophageal reflux disease without esophagitis: K21.9

## 2019-10-20 LAB — PROTIME-INR
INR: 1 (ref 0.8–1.2)
Prothrombin Time: 13.2 seconds (ref 11.4–15.2)

## 2019-10-21 LAB — SARS CORONAVIRUS 2 (TAT 6-24 HRS): SARS Coronavirus 2: NEGATIVE

## 2019-10-22 ENCOUNTER — Ambulatory Visit (HOSPITAL_COMMUNITY): Payer: Medicaid Other | Admitting: Certified Registered"

## 2019-10-22 ENCOUNTER — Ambulatory Visit (HOSPITAL_COMMUNITY)
Admission: RE | Admit: 2019-10-22 | Discharge: 2019-10-22 | Disposition: A | Discharge status: Home / Self Care | Payer: Medicaid Other | Attending: Internal Medicine | Admitting: Internal Medicine

## 2019-10-22 ENCOUNTER — Encounter (HOSPITAL_COMMUNITY)
Admission: RE | Disposition: A | Discharge status: Home / Self Care | Payer: Self-pay | Source: Home / Self Care | Attending: Internal Medicine

## 2019-10-22 ENCOUNTER — Encounter (HOSPITAL_COMMUNITY): Payer: Self-pay | Admitting: Internal Medicine

## 2019-10-22 DIAGNOSIS — Z7951 Long term (current) use of inhaled steroids: Secondary | ICD-10-CM | POA: Insufficient documentation

## 2019-10-22 DIAGNOSIS — M359 Systemic involvement of connective tissue, unspecified: Secondary | ICD-10-CM | POA: Diagnosis not present

## 2019-10-22 DIAGNOSIS — R1314 Dysphagia, pharyngoesophageal phase: Secondary | ICD-10-CM | POA: Diagnosis not present

## 2019-10-22 DIAGNOSIS — Z79899 Other long term (current) drug therapy: Secondary | ICD-10-CM | POA: Insufficient documentation

## 2019-10-22 DIAGNOSIS — F419 Anxiety disorder, unspecified: Secondary | ICD-10-CM | POA: Insufficient documentation

## 2019-10-22 DIAGNOSIS — G709 Myoneural disorder, unspecified: Secondary | ICD-10-CM | POA: Insufficient documentation

## 2019-10-22 DIAGNOSIS — Z86010 Personal history of colonic polyps: Secondary | ICD-10-CM | POA: Insufficient documentation

## 2019-10-22 DIAGNOSIS — F319 Bipolar disorder, unspecified: Secondary | ICD-10-CM | POA: Diagnosis not present

## 2019-10-22 DIAGNOSIS — Z833 Family history of diabetes mellitus: Secondary | ICD-10-CM | POA: Diagnosis not present

## 2019-10-22 DIAGNOSIS — J449 Chronic obstructive pulmonary disease, unspecified: Secondary | ICD-10-CM | POA: Insufficient documentation

## 2019-10-22 DIAGNOSIS — F17200 Nicotine dependence, unspecified, uncomplicated: Secondary | ICD-10-CM | POA: Insufficient documentation

## 2019-10-22 DIAGNOSIS — K2970 Gastritis, unspecified, without bleeding: Secondary | ICD-10-CM | POA: Diagnosis not present

## 2019-10-22 DIAGNOSIS — M419 Scoliosis, unspecified: Secondary | ICD-10-CM | POA: Diagnosis not present

## 2019-10-22 DIAGNOSIS — Z8619 Personal history of other infectious and parasitic diseases: Secondary | ICD-10-CM | POA: Insufficient documentation

## 2019-10-22 DIAGNOSIS — Z823 Family history of stroke: Secondary | ICD-10-CM | POA: Diagnosis not present

## 2019-10-22 DIAGNOSIS — Z809 Family history of malignant neoplasm, unspecified: Secondary | ICD-10-CM | POA: Diagnosis not present

## 2019-10-22 DIAGNOSIS — F209 Schizophrenia, unspecified: Secondary | ICD-10-CM | POA: Insufficient documentation

## 2019-10-22 DIAGNOSIS — K219 Gastro-esophageal reflux disease without esophagitis: Secondary | ICD-10-CM | POA: Insufficient documentation

## 2019-10-22 DIAGNOSIS — Z886 Allergy status to analgesic agent status: Secondary | ICD-10-CM | POA: Insufficient documentation

## 2019-10-22 DIAGNOSIS — Z885 Allergy status to narcotic agent status: Secondary | ICD-10-CM | POA: Insufficient documentation

## 2019-10-22 DIAGNOSIS — Z9049 Acquired absence of other specified parts of digestive tract: Secondary | ICD-10-CM | POA: Insufficient documentation

## 2019-10-22 DIAGNOSIS — K2960 Other gastritis without bleeding: Secondary | ICD-10-CM | POA: Insufficient documentation

## 2019-10-22 DIAGNOSIS — R1013 Epigastric pain: Secondary | ICD-10-CM | POA: Diagnosis not present

## 2019-10-22 DIAGNOSIS — Z21 Asymptomatic human immunodeficiency virus [HIV] infection status: Secondary | ICD-10-CM | POA: Diagnosis not present

## 2019-10-22 DIAGNOSIS — Z8611 Personal history of tuberculosis: Secondary | ICD-10-CM | POA: Diagnosis not present

## 2019-10-22 DIAGNOSIS — Z8249 Family history of ischemic heart disease and other diseases of the circulatory system: Secondary | ICD-10-CM | POA: Insufficient documentation

## 2019-10-22 DIAGNOSIS — I10 Essential (primary) hypertension: Secondary | ICD-10-CM | POA: Insufficient documentation

## 2019-10-22 DIAGNOSIS — Z888 Allergy status to other drugs, medicaments and biological substances status: Secondary | ICD-10-CM | POA: Insufficient documentation

## 2019-10-22 DIAGNOSIS — E039 Hypothyroidism, unspecified: Secondary | ICD-10-CM | POA: Insufficient documentation

## 2019-10-22 DIAGNOSIS — F17210 Nicotine dependence, cigarettes, uncomplicated: Secondary | ICD-10-CM | POA: Insufficient documentation

## 2019-10-22 DIAGNOSIS — Z91018 Allergy to other foods: Secondary | ICD-10-CM | POA: Insufficient documentation

## 2019-10-22 HISTORY — PX: ESOPHAGOGASTRODUODENOSCOPY (EGD) WITH PROPOFOL: SHX5813

## 2019-10-22 HISTORY — PX: ESOPHAGEAL DILATION: SHX303

## 2019-10-22 HISTORY — PX: BIOPSY: SHX5522

## 2019-10-22 LAB — GLUCOSE, CAPILLARY
Glucose-Capillary: 78 mg/dL (ref 70–99)
Glucose-Capillary: 60 mg/dL — ABNORMAL LOW (ref 70–99)
Glucose-Capillary: 66 mg/dL — ABNORMAL LOW (ref 70–99)

## 2019-10-22 SURGERY — ESOPHAGOGASTRODUODENOSCOPY (EGD) WITH PROPOFOL
Anesthesia: General

## 2019-10-22 MED ORDER — LIDOCAINE HCL (CARDIAC) PF 50 MG/5ML IV SOSY
PREFILLED_SYRINGE | INTRAVENOUS | Status: DC | PRN
  Administered 2019-10-22: 80 mg via INTRAVENOUS

## 2019-10-22 MED ORDER — PROPOFOL 500 MG/50ML IV EMUL
INTRAVENOUS | Status: DC | PRN
  Administered 2019-10-22: 175 ug/kg/min via INTRAVENOUS
  Administered 2019-10-22: 100 mg via INTRAVENOUS

## 2019-10-22 MED ORDER — LACTATED RINGERS IV SOLN: INTRAVENOUS | Status: DC

## 2019-10-22 MED ORDER — GLYCOPYRROLATE 0.2 MG/ML IJ SOLN
INTRAMUSCULAR | Status: AC
  Filled 2019-10-22: qty 1

## 2019-10-22 MED ORDER — STERILE WATER FOR IRRIGATION IR SOLN
Status: DC | PRN
  Administered 2019-10-22: 100 mL

## 2019-10-22 MED ORDER — LIDOCAINE VISCOUS HCL 2 % MT SOLN
OROMUCOSAL | Status: AC
  Filled 2019-10-22: qty 15

## 2019-10-22 MED ORDER — GLYCOPYRROLATE 0.2 MG/ML IJ SOLN
0.2000 mg | Freq: Once | INTRAMUSCULAR | Status: AC
  Administered 2019-10-22: 0.2 mg via INTRAVENOUS

## 2019-10-22 MED ORDER — FENTANYL CITRATE (PF) 100 MCG/2ML IJ SOLN: 25.0000 ug | INTRAMUSCULAR | Status: DC | PRN

## 2019-10-22 MED ORDER — LIDOCAINE VISCOUS HCL 2 % MT SOLN
15.0000 mL | Freq: Once | OROMUCOSAL | Status: AC
  Administered 2019-10-22: 15 mL via OROMUCOSAL

## 2019-10-22 NOTE — Discharge Instructions (Signed)
No aspirin or NSAIDs for 24 hours. Resume usual medications as before. Resume usual diet. No driving for 24 hours. Physician will call with biopsy results.      Upper Endoscopy, Adult, Care After This sheet gives you information about how to care for yourself after your procedure. Your health care provider may also give you more specific instructions. If you have problems or questions, contact your health care provider. What can I expect after the procedure? After the procedure, it is common to have:  A sore throat.  Mild stomach pain or discomfort.  Bloating.  Nausea. Follow these instructions at home:   Follow instructions from your health care provider about what to eat or drink after your procedure.  Return to your normal activities as told by your health care provider. Ask your health care provider what activities are safe for you.  Take over-the-counter and prescription medicines only as told by your health care provider.  Do not drive for 24 hours if you were given a sedative during your procedure.  Keep all follow-up visits as told by your health care provider. This is important. Contact a health care provider if you have:  A sore throat that lasts longer than one day.  Trouble swallowing. Get help right away if:  You vomit blood or your vomit looks like coffee grounds.  You have: ? A fever. ? Bloody, black, or tarry stools. ? A severe sore throat or you cannot swallow. ? Difficulty breathing. ? Severe pain in your chest or abdomen. Summary  After the procedure, it is common to have a sore throat, mild stomach discomfort, bloating, and nausea.  Do not drive for 24 hours if you were given a sedative during the procedure.  Follow instructions from your health care provider about what to eat or drink after your procedure.  Return to your normal activities as told by your health care provider. This information is not intended to replace advice given to  you by your health care provider. Make sure you discuss any questions you have with your health care provider. Document Revised: 08/07/2017 Document Reviewed: 07/16/2017 Elsevier Patient Education  2020 [place] After These instructions provide you with information about caring for yourself after your procedure. Your health care provider may also give you more specific instructions. Your treatment has been planned according to current medical practices, but problems sometimes occur. Call your health care provider if you have any problems or questions after your procedure. What can I expect after the procedure? After your procedure, you may:  Feel sleepy for several hours.  Feel clumsy and have poor balance for several hours.  Feel forgetful about what happened after the procedure.  Have poor judgment for several hours.  Feel nauseous or vomit.  Have a sore throat if you had a breathing tube during the procedure. Follow these instructions at home: For at least 24 hours after the procedure:      Have a responsible adult stay with you. It is important to have someone help care for you until you are awake and alert.  Rest as needed.  Do not: ? Participate in activities in which you could fall or become injured. ? Drive. ? Use heavy machinery. ? Drink alcohol. ? Take sleeping pills or medicines that cause drowsiness. ? Make important decisions or sign legal documents. ? Take care of children on your own. Eating and drinking  Follow the diet that is recommended by  your health care provider.  If you vomit, drink water, juice, or soup when you can drink without vomiting.  Make sure you have little or no nausea before eating solid foods. General instructions  Take over-the-counter and prescription medicines only as told by your health care provider.  If you have sleep apnea, surgery and certain medicines can increase your risk for  breathing problems. Follow instructions from your health care provider about wearing your sleep device: ? Anytime you are sleeping, including during daytime naps. ? While taking prescription pain medicines, sleeping medicines, or medicines that make you drowsy.  If you smoke, do not smoke without supervision.  Keep all follow-up visits as told by your health care provider. This is important. Contact a health care provider if:  You keep feeling nauseous or you keep vomiting.  You feel light-headed.  You develop a rash.  You have a fever. Get help right away if:  You have trouble breathing. Summary  For several hours after your procedure, you may feel sleepy and have poor judgment.  Have a responsible adult stay with you for at least 24 hours or until you are awake and alert. This information is not intended to replace advice given to you by your health care provider. Make sure you discuss any questions you have with your health care provider. Document Revised: 05/14/2017 Document Reviewed: 06/06/2015 Elsevier Patient Education  [place].

## 2019-10-22 NOTE — Op Note (Signed)
[facility] Patient Name: Ashley [name] Procedure Date: 10/22/2019 11:49 AM MRN: [id number] Date of Birth: [birth year] Attending MD: Hildred Laser , MD CSN: [id number] Age: 54 Admit Type: Outpatient Procedure:                Upper GI endoscopy Indications:              Epigastric abdominal pain, Esophageal dysphagia Providers:                Hildred Laser, MD, Janeece Riggers, RN, Raphael Gibney,                            Technician Referring MD:             Koleen Distance. Gottschalk, DO Medicines:                Propofol per Anesthesia Complications:            No immediate complications. Estimated Blood Loss:     Estimated blood loss was minimal. Procedure:                Pre-Anesthesia Assessment:                           - Prior to the procedure, a History and Physical                            was performed, and patient medications and                            allergies were reviewed. The patient's tolerance of                            previous anesthesia was also reviewed. The risks                            and benefits of the procedure and the sedation                            options and risks were discussed with the patient.                            All questions were answered, and informed consent                            was obtained. Prior Anticoagulants: The patient has                            taken no previous anticoagulant or antiplatelet                            agents. ASA Grade Assessment: III - A patient with                            severe systemic disease. After reviewing the risks  and benefits, the patient was deemed in                            satisfactory condition to undergo the procedure.                           After obtaining informed consent, the endoscope was                            passed under direct vision. Throughout the                            procedure, the patient's blood pressure, pulse, and                             oxygen saturations were monitored continuously. The                            GIF-H190 ([id number]) scope was introduced through the                            mouth, and advanced to the second part of duodenum.                            The upper GI endoscopy was accomplished without                            difficulty. The patient tolerated the procedure                            well. Scope In: 12:08:36 PM Scope Out: 53:61:44 PM Total Procedure Duration: 0 hours 9 minutes 43 seconds  Findings:      The hypopharynx was normal.      The examined esophagus was normal.      The Z-line was irregular and was found 35 cm from the incisors.      No endoscopic abnormality was evident in the esophagus to explain the       patient's complaint of dysphagia. It was decided, however, to proceed       with dilation of the entire esophagus. The scope was withdrawn. Dilation       was performed with a Maloney dilator with no resistance at 85 Fr. The       dilation site was examined following endoscope reinsertion and showed no       change and no bleeding, mucosal tear or perforation.      Patchy mild inflammation characterized by erosions was found on the       lesser curvature of the gastric body and on the posterior wall of the       gastric body. Biopsies were taken with a cold forceps for histology.      The exam of the stomach was otherwise normal.      The duodenal bulb and second portion of the duodenum were normal. Impression:               - Normal hypopharynx.                           -  Normal esophagus.                           - Z-line irregular, 35 cm from the incisors.                           - No endoscopic esophageal abnormality to explain                            patient's dysphagia. Esophagus dilated. Dilated.                           - Gastritis. Biopsied.                           - Normal duodenal bulb and second portion of the                             duodenum. Moderate Sedation:      Per Anesthesia Care Recommendation:           - Patient has a contact number available for                            emergencies. The signs and symptoms of potential                            delayed complications were discussed with the                            patient. Return to normal activities tomorrow.                            Written discharge instructions were provided to the                            patient.                           - Resume previous diet today.                           - Continue present medications.                           - No aspirin, ibuprofen, naproxen, or other                            non-steroidal anti-inflammatory drugs for 1 day.                           - Await pathology results. Procedure Code(s):        --- Professional ---                           [phone], Esophagogastroduodenoscopy, flexible,  transoral; with biopsy, single or multiple                           43450, Dilation of esophagus, by unguided sound or                            bougie, single or multiple passes Diagnosis Code(s):        --- Professional ---                           K22.8, Other specified diseases of esophagus                           K29.70, Gastritis, unspecified, without bleeding                           R10.13, Epigastric pain                           R13.14, Dysphagia, pharyngoesophageal phase CPT copyright 2019 American Medical Association. All rights reserved. The codes documented in this report are preliminary and upon coder review may  be revised to meet current compliance requirements. Hildred Laser, MD Hildred Laser, MD 10/22/2019 12:26:41 PM This report has been signed electronically. Number of Addenda: 0

## 2019-10-22 NOTE — Anesthesia Postprocedure Evaluation (Signed)
Anesthesia Post Note  Patient: MAKITA BLOW  Procedure(s) Performed: ESOPHAGOGASTRODUODENOSCOPY (EGD) WITH PROPOFOL (N/A ) BIOPSY ESOPHAGEAL DILATION  Patient location during evaluation: PACU Anesthesia Type: General Level of consciousness: awake, oriented, awake and alert and patient cooperative Pain management: pain level controlled Vital Signs Assessment: post-procedure vital signs reviewed and stable Respiratory status: spontaneous breathing, respiratory function stable, nonlabored ventilation and patient connected to nasal cannula oxygen Cardiovascular status: blood pressure returned to baseline and stable Postop Assessment: no headache and no backache Anesthetic complications: no   No complications documented.   Last Vitals:  Vitals:   10/22/19 1125 10/22/19 1130  BP: 112/82   Pulse: 60 62  Resp: 10 12  Temp:    SpO2: 98% 97%    Last Pain:  Vitals:   10/22/19 1205  PainSc: 0-No pain                 Tacy Learn

## 2019-10-22 NOTE — Anesthesia Preprocedure Evaluation (Signed)
Anesthesia Evaluation  Patient identified by MRN, date of birth, ID band Patient awake    Reviewed: Allergy & Precautions, H&P , NPO status , Patient's Chart, lab work & pertinent test results, reviewed documented beta blocker date and time   Airway Mallampati: I  TM Distance: >3 FB Neck ROM: full    Dental no notable dental hx.    Pulmonary asthma , COPD,  COPD inhaler, Current Smoker,    Pulmonary exam normal breath sounds clear to auscultation       Cardiovascular Exercise Tolerance: Good hypertension, negative cardio ROS   Rhythm:regular Rate:Normal     Neuro/Psych PSYCHIATRIC DISORDERS Anxiety Depression Bipolar Disorder Schizophrenia  Neuromuscular disease    GI/Hepatic GERD  Medicated,(+) Hepatitis -, C  Endo/Other  diabetesHypothyroidism   Renal/GU negative Renal ROS  negative genitourinary   Musculoskeletal   Abdominal   Peds  Hematology  (+) HIV,   Anesthesia Other Findings   Reproductive/Obstetrics negative OB ROS                             Anesthesia Physical Anesthesia Plan  ASA: III  Anesthesia Plan: General   Post-op Pain Management:    Induction:   PONV Risk Score and Plan: Propofol infusion  Airway Management Planned:   Additional Equipment:   Intra-op Plan:   Post-operative Plan:   Informed Consent: I have reviewed the patients History and Physical, chart, labs and discussed the procedure including the risks, benefits and alternatives for the proposed anesthesia with the patient or authorized representative who has indicated his/her understanding and acceptance.     Dental Advisory Given  Plan Discussed with: CRNA  Anesthesia Plan Comments:         Anesthesia Quick Evaluation

## 2019-10-22 NOTE — Transfer of Care (Signed)
Immediate Anesthesia Transfer of Care Note  Patient: Ashley [name]  Procedure(s) Performed: ESOPHAGOGASTRODUODENOSCOPY (EGD) WITH PROPOFOL (N/A ) BIOPSY ESOPHAGEAL DILATION  Patient Location: PACU  Anesthesia Type:General  Level of Consciousness: awake, alert , oriented and patient cooperative  Airway & Oxygen Therapy: Patient Spontanous Breathing and Patient connected to nasal cannula oxygen  Post-op Assessment: Report given to RN, Post -op Vital signs reviewed and stable and Patient moving all extremities  Post vital signs: Reviewed and stable  Last Vitals:  Vitals Value Taken Time  BP    Temp    Pulse 78 10/22/19 1221  Resp 17 10/22/19 1221  SpO2 92 % 10/22/19 1221  Vitals shown include unvalidated device data.  Last Pain:  Vitals:   10/22/19 1205  PainSc: 0-No pain         Complications: No complications documented.

## 2019-10-22 NOTE — H&P (Signed)
Ashley [name] is an 54 y.o. female.   Chief Complaint: Patient is here for esophagogastroduodenoscopy with esophageal dilation. HPI: Patient is 54 year old Afro-American female with history of HIV on therapy with undetectable HIV RNA levels as well as history of hepatitis C who has been treated with SVR who presents with a 6-month history of epigastric pain associated with nausea and vomiting.  She also complains of intermittent dysphagia to solids.  She points to lower sternal area site of bolus obstruction.  She has had few episodes of regurgitation but food bolus generally passes down.  She denies hematemesis melena or rectal bleeding.  She also has noted early satiety and may have lost few pounds recently.  She is status post cholecystectomy. She had mildly elevated transaminases.  These have improved on follow-up. She is on PPI for chronic GERD has intermittent heartburn.   Past Medical History:  Diagnosis Date  . Ankle fracture   . Anxiety   . Asthma   . Collagen vascular disease ([place])   . COPD (chronic obstructive pulmonary disease) ([place])   . Depression   . Diabetes mellitus   . Elevated LFTs 01/13/2012  . GERD (gastroesophageal reflux disease)   . Hepatitis C   . Hernia   . HIV (human immunodeficiency virus infection) ([place])   . HIV (human immunodeficiency virus infection) ([place])   . Hypertension   . Hypothyroidism   . Pinched nerve   . Scoliosis   . TB (tuberculosis)     Past Surgical History:  Procedure Laterality Date  . BIOPSY  05/08/2018   Procedure: biopsy;  Surgeon: Rogene Houston, MD;  Location: [facility];  Service: Endoscopy;;  proximal transverse colon polyp  . CESAREAN SECTION    . CHOLECYSTECTOMY    . COLONOSCOPY N/A 05/08/2018   Procedure: COLONOSCOPY;  Surgeon: Rogene Houston, MD;  Location: [facility];  Service: Endoscopy;  Laterality: N/A;  2:00  . HERNIA REPAIR    . INCISIONAL HERNIA REPAIR N/A 09/30/2018   Procedure: OPEN HERNIA REPAIR INCISIONAL  WITH MESH;  Surgeon: Virl Cagey, MD;  Location: [facility] OR[facility];  Service: General;  Laterality: N/A;  . LIVER BIOPSY      Family History  Problem Relation Age of Onset  . Diabetes Mother   . Hypertension Mother   . Stroke Mother        died 10/13/13 . Cancer Sister   . Cancer Maternal Aunt    Social History:  reports that she has been smoking cigarettes. She has a 35.00 pack-year smoking history. She has never used smokeless tobacco. She reports previous alcohol use of about 3.0 standard drinks of alcohol per week. She reports previous drug use. Drugs: Marijuana and Cocaine.  Allergies:  Allergies  Allergen Reactions  . Aspirin Other (See Comments)    Liver problems  . Roxicodone [Oxycodone Hcl] Itching  . Tomato     Medications Prior to Admission  Medication Sig Dispense Refill  . albuterol (VENTOLIN HFA) 108 (90 Base) MCG/ACT inhaler Inhale 2 puffs into the lungs every 6 (six) hours as needed for wheezing or shortness of breath. 18 g 0  . amLODipine (NORVASC) 10 MG tablet TAKE 1 TABLET BY MOUTH EVERY DAY 90 tablet 0  . bictegravir-emtricitabine-tenofovir AF (BIKTARVY) 50-200-25 MG TABS tablet Take 1 tablet by mouth daily. 30 tablet 1  . loratadine (CLARITIN) 10 MG tablet Take 1 tablet (10 mg total) by mouth daily. 30 tablet 1  . pantoprazole (PROTONIX) 40  MG tablet Take 1 tablet (40 mg total) by mouth 2 (two) times daily before a meal. 60 tablet 3  . QUEtiapine (SEROQUEL) 300 MG tablet Take 1 tablet (300 mg total) by mouth at bedtime. 30 tablet 1  . risperiDONE (RISPERDAL) 0.5 MG tablet Take 1 tablet (0.5 mg total) by mouth 2 (two) times daily. 60 tablet 1  . docusate sodium (COLACE) 100 MG capsule Take 1 capsule (100 mg total) by mouth 2 (two) times daily. 60 capsule 2    Results for orders placed or performed during the hospital encounter of 10/22/19 (from the past 48 hour(s))  Glucose, capillary     Status: None   Collection Time: 10/22/19 11:13 AM  Result Value Ref  Range   Glucose-Capillary 78 70 - 99 mg/dL    Comment: Glucose reference range applies only to samples taken after fasting for at least 8 hours.   No results found.  Review of Systems  Blood pressure 112/82, pulse 62, temperature 98 F (36.7 C), resp. rate 12, SpO2 97 %. Physical Exam HENT:     Mouth/Throat:     Mouth: Mucous membranes are moist.     Pharynx: Oropharynx is clear.  Eyes:     General: No scleral icterus.    Conjunctiva/sclera: Conjunctivae normal.  Cardiovascular:     Rate and Rhythm: Normal rate and regular rhythm.     Heart sounds: Normal heart sounds.  Pulmonary:     Effort: Pulmonary effort is normal.     Breath sounds: Normal breath sounds.  Abdominal:     Comments: Abdomen is symmetrical.  She has lower midline scar.  On palpation abdomen is soft.  She has mild midepigastric tenderness.  No organomegaly or masses.  Musculoskeletal:        General: No swelling.     Cervical back: Neck supple.  Lymphadenopathy:     Cervical: No cervical adenopathy.  Skin:    General: Skin is warm and dry.  Neurological:     Mental Status: She is alert.      Assessment/Plan Epigastric pain with nausea and vomiting. Esophageal dysphagia. Esophagogastroduodenoscopy with esophageal dilation.   Hildred Laser, MD 10/22/2019, 12:00 PM

## 2019-10-23 ENCOUNTER — Other Ambulatory Visit: Payer: Self-pay

## 2019-10-23 LAB — HCV RNA QUANT: HCV Quantitative: NOT DETECTED IU/mL (ref >50)

## 2019-10-23 LAB — SURGICAL PATHOLOGY

## 2019-10-24 ENCOUNTER — Encounter (HOSPITAL_COMMUNITY): Payer: Self-pay | Admitting: Internal Medicine

## 2019-10-29 ENCOUNTER — Other Ambulatory Visit (INDEPENDENT_AMBULATORY_CARE_PROVIDER_SITE_OTHER): Payer: Self-pay | Admitting: Internal Medicine

## 2019-10-29 DIAGNOSIS — R1013 Epigastric pain: Secondary | ICD-10-CM

## 2019-10-30 ENCOUNTER — Ambulatory Visit (INDEPENDENT_AMBULATORY_CARE_PROVIDER_SITE_OTHER): Payer: Medicaid Other | Admitting: Gastroenterology

## 2019-10-30 ENCOUNTER — Other Ambulatory Visit: Payer: Self-pay | Admitting: Internal Medicine

## 2019-11-04 ENCOUNTER — Other Ambulatory Visit (INDEPENDENT_AMBULATORY_CARE_PROVIDER_SITE_OTHER): Payer: Self-pay | Admitting: *Deleted

## 2019-11-04 DIAGNOSIS — R112 Nausea with vomiting, unspecified: Secondary | ICD-10-CM

## 2019-11-06 ENCOUNTER — Ambulatory Visit (HOSPITAL_COMMUNITY)
Admission: RE | Admit: 2019-11-06 | Discharge: 2019-11-06 | Disposition: A | Discharge status: Home / Self Care | Payer: Medicaid Other | Source: Ambulatory Visit | Attending: Internal Medicine | Admitting: Internal Medicine

## 2019-11-06 ENCOUNTER — Other Ambulatory Visit: Payer: Self-pay

## 2019-11-06 DIAGNOSIS — R112 Nausea with vomiting, unspecified: Secondary | ICD-10-CM | POA: Diagnosis present

## 2019-11-06 LAB — POCT I-STAT CREATININE: Creatinine, Ser: 0.8 mg/dL (ref 0.44–1.00)

## 2019-11-06 MED ORDER — IOHEXOL 300 MG/ML  SOLN
100.0000 mL | Freq: Once | INTRAMUSCULAR | Status: AC | PRN
  Administered 2019-11-06: 100 mL via INTRAVENOUS

## 2019-11-11 ENCOUNTER — Other Ambulatory Visit (INDEPENDENT_AMBULATORY_CARE_PROVIDER_SITE_OTHER): Payer: Self-pay | Admitting: Internal Medicine

## 2019-11-11 DIAGNOSIS — R1013 Epigastric pain: Secondary | ICD-10-CM

## 2019-11-18 ENCOUNTER — Other Ambulatory Visit: Payer: Self-pay | Admitting: Family Medicine

## 2019-11-18 DIAGNOSIS — J439 Emphysema, unspecified: Secondary | ICD-10-CM

## 2019-11-27 ENCOUNTER — Other Ambulatory Visit (HOSPITAL_COMMUNITY)
Admission: RE | Admit: 2019-11-27 | Discharge: 2019-11-27 | Disposition: A | Discharge status: Home / Self Care | Payer: Medicaid Other | Source: Ambulatory Visit | Attending: Internal Medicine | Admitting: Internal Medicine

## 2019-11-27 ENCOUNTER — Other Ambulatory Visit: Payer: Self-pay

## 2019-11-27 DIAGNOSIS — R1013 Epigastric pain: Secondary | ICD-10-CM | POA: Diagnosis not present

## 2019-11-27 LAB — COMPREHENSIVE METABOLIC PANEL
ALT: 20 U/L (ref 0–44)
AST: 29 U/L (ref 15–41)
Albumin: 4.2 g/dL (ref 3.5–5.0)
Alkaline Phosphatase: 76 U/L (ref 38–126)
Anion gap: 11 (ref 5–15)
BUN: 12 mg/dL (ref 6–20)
CO2: 30 mmol/L (ref 22–32)
Calcium: 9.2 mg/dL (ref 8.9–10.3)
Chloride: 98 mmol/L (ref 98–111)
Creatinine, Ser: 0.92 mg/dL (ref 0.44–1.00)
GFR calc Af Amer: >60 mL/min (ref >60)
GFR calc non Af Amer: >60 mL/min (ref >60)
Glucose, Bld: 102 mg/dL — ABNORMAL HIGH (ref 70–99)
Potassium: 4.4 mmol/L (ref 3.5–5.1)
Sodium: 139 mmol/L (ref 135–145)
Total Bilirubin: 0.1 mg/dL — ABNORMAL LOW (ref 0.3–1.2)
Total Protein: 7.4 g/dL (ref 6.5–8.1)

## 2019-11-27 LAB — LIPASE, BLOOD: Lipase: 42 U/L (ref 11–51)

## 2019-12-03 ENCOUNTER — Ambulatory Visit: Payer: Medicaid Other | Admitting: Family Medicine

## 2019-12-03 ENCOUNTER — Encounter: Payer: Self-pay | Admitting: Family Medicine

## 2019-12-03 NOTE — Progress Notes (Deleted)
Subjective: CC: f/u HTN, DM  PCP: Janora Norlander, DO Ashley [name] is a 54 y.o. female presenting to clinic today for:  1. Type 2 Diabetes w/ HTN:  Patient reports compliance with Norvasc 10 mg daily.  She has been diet-controlled from the diabetes standpoint has not had to take medications.  Last eye exam: Due for eye exam Last foot exam: Up-to-date Last A1c:  Lab Results  Component Value Date   HGBA1C 5.5 06/20/2019   Nephropathy screen indicated?: ordered Last flu, zoster and/or pneumovax:  Immunization History  Administered Date(s) Administered  . Hepatitis A 02/18/2002, 04/22/2002, 11/06/2005  . Hepatitis B 02/18/2002, 04/22/2002, 11/06/2005  . Influenza Split 12/19/2011  . Influenza,inj,Quad PF,6+ Mos 02/02/2015, 12/17/2015, 12/09/2018  . Pneumococcal Conjugate-13 10/17/2017  . Pneumococcal Polysaccharide-23 11/06/2005, 06/11/2009  . Rabies, IM 04/19/2014, 04/22/2014, 05/09/2014  . Td 07/18/1988  . Tdap 11/06/2005     ROS: denies fever, chills, dizziness, LOC, polyuria, polydipsia, unintended weight loss/gain, foot ulcerations, numbness or tingling in extremities or chest pain. ***  2.  GERD/constipation Patient reports good control of acid reflux with Protonix but does note that she had some bleeding in her stool a few days ago.  She has a history of chronic constipation with good relief with MiraLAX.  She needs refills on both tonics and MiraLAX.  She states that she attempted to contact the gastroenterologist but they have asked that she contact our office first for referral.  She was being seen in [place] per her report.  Medical history also significant for hepatitis C and HIV.  She is under the care of infectious disease for these issues. ***  3.  Depression Patient reports good control depression with Prozac 20 mg daily.  No SI or HI.  She needs refills. ***   ROS: Per HPI  Allergies  Allergen Reactions  . Aspirin Other (See Comments)     Liver problems  . Roxicodone [Oxycodone Hcl] Itching  . Tomato    Past Medical History:  Diagnosis Date  . Ankle fracture   . Anxiety   . Asthma   . Collagen vascular disease ([place])   . COPD (chronic obstructive pulmonary disease) ([place])   . Depression   . Diabetes mellitus   . Elevated LFTs 01/13/2012  . GERD (gastroesophageal reflux disease)   . Hepatitis C   . Hernia   . HIV (human immunodeficiency virus infection) ([place])   . HIV (human immunodeficiency virus infection) ([place])   . Hypertension   . Hypothyroidism   . Pinched nerve   . Scoliosis   . TB (tuberculosis)     Current Outpatient Medications:  .  amLODipine (NORVASC) 10 MG tablet, TAKE 1 TABLET BY MOUTH EVERY DAY, Disp: 90 tablet, Rfl: 0 .  BIKTARVY 50-200-25 MG TABS tablet, TAKE ONE TABLET BY MOUTH EVERY DAY, Disp: 30 tablet, Rfl: 2 .  docusate sodium (COLACE) 100 MG capsule, Take 1 capsule (100 mg total) by mouth 2 (two) times daily., Disp: 60 capsule, Rfl: 2 .  loratadine (CLARITIN) 10 MG tablet, Take 1 tablet (10 mg total) by mouth daily., Disp: 30 tablet, Rfl: 1 .  pantoprazole (PROTONIX) 40 MG tablet, Take 1 tablet (40 mg total) by mouth 2 (two) times daily before a meal., Disp: 60 tablet, Rfl: 3 .  PROAIR HFA 108 (90 Base) MCG/ACT inhaler, INHALE TWO PUFFS INTO THE LUNGS EVERY 6 HOURS AS NEEDED FOR WHEEZING OR SHORTNESS OF BREATH, Disp: 8.5 g, Rfl: 0 .  QUEtiapine (SEROQUEL)  300 MG tablet, Take 1 tablet (300 mg total) by mouth at bedtime., Disp: 30 tablet, Rfl: 1 Social History   Socioeconomic History  . Marital status: Single    Spouse name: Not on file  . Number of children: 2  . Years of education: Not on file  . Highest education level: Not on file  Occupational History  . Occupation: Disability  Tobacco Use  . Smoking status: Current Every Day Smoker    Packs/day: 1.00    Years: 35.00    Pack years: 35.00    Types: Cigarettes  . Smokeless tobacco: Never Used  . Tobacco comment: 1 pack every 2-3  days  Vaping Use  . Vaping Use: Never used  Substance and Sexual Activity  . Alcohol use: Not Currently    Alcohol/week: 3.0 standard drinks    Types: 3 Cans of beer per week  . Drug use: Not Currently    Types: Marijuana, Cocaine    Comment: last cocaine was 07/15/2017.  Marland Kitchen Sexual activity: Not Currently    Birth control/protection: Condom, None    Comment: condoms offered, condoms and lube provided  Other Topics Concern  . Not on file  Social History Narrative   Pt lives in an apartment by herself.  Pt stated that she is married and that her husband is in a Corporate treasurer, and that husband is being released yesterday or today.  Asked several times who Pt's psychiatric provider is.   Social Determinants of Health   Financial Resource Strain:   . Difficulty of Paying Living Expenses: Not on file  Food Insecurity:   . Worried About Charity fundraiser in the Last Year: Not on file  . Ran Out of Food in the Last Year: Not on file  Transportation Needs:   . Lack of Transportation (Medical): Not on file  . Lack of Transportation (Non-Medical): Not on file  Physical Activity:   . Days of Exercise per Week: Not on file  . Minutes of Exercise per Session: Not on file  Stress:   . Feeling of Stress : Not on file  Social Connections:   . Frequency of Communication with Friends and Family: Not on file  . Frequency of Social Gatherings with Friends and Family: Not on file  . Attends Religious Services: Not on file  . Active Member of Clubs or Organizations: Not on file  . Attends Archivist Meetings: Not on file  . Marital Status: Not on file  Intimate Partner Violence:   . Fear of Current or Ex-Partner: Not on file  . Emotionally Abused: Not on file  . Physically Abused: Not on file  . Sexually Abused: Not on file   Family History  Problem Relation Age of Onset  . Diabetes Mother   . Hypertension Mother   . Stroke Mother        died 2013/10/14 . Cancer Sister     . Cancer Maternal Aunt     Objective: Office vital signs reviewed. There were no vitals taken for this visit.  Physical Examination:  General: Awake, alert, No acute distress HEENT: Normal, sclera white, MMM, poor dentition Cardio: regular rate and rhythm, S1S2 heard, no murmurs appreciated Pulm: clear to auscultation bilaterally, no wheezes, rhonchi or rales; normal work of breathing on room air Extremities: warm, well perfused, No edema, cyanosis or clubbing; +2 pulses bilaterally; bilateral feet with thickened, onychomycotic great toenails Neuro: see DM foot Diabetic Foot Exam - Simple  No data filed      ***  Assessment/ Plan: 54 y.o. female   *** No orders of the defined types were placed in this encounter.  No orders of the defined types were placed in this encounter.    Janora Norlander, DO [place] [phone]

## 2019-12-17 ENCOUNTER — Other Ambulatory Visit: Payer: Self-pay | Admitting: Family Medicine

## 2019-12-17 DIAGNOSIS — J439 Emphysema, unspecified: Secondary | ICD-10-CM

## 2019-12-22 ENCOUNTER — Encounter: Payer: Self-pay | Admitting: Nurse Practitioner

## 2019-12-22 ENCOUNTER — Ambulatory Visit (INDEPENDENT_AMBULATORY_CARE_PROVIDER_SITE_OTHER): Payer: Medicaid Other | Admitting: Nurse Practitioner

## 2019-12-22 ENCOUNTER — Other Ambulatory Visit: Payer: Self-pay

## 2019-12-22 ENCOUNTER — Other Ambulatory Visit (HOSPITAL_COMMUNITY)
Admission: RE | Admit: 2019-12-22 | Discharge: 2019-12-22 | Disposition: A | Discharge status: Home / Self Care | Payer: Medicaid Other | Source: Ambulatory Visit | Attending: Nurse Practitioner | Admitting: Nurse Practitioner

## 2019-12-22 VITALS — BP 114/73 | HR 61 | Temp 97.7°F | Resp 20 | Ht 59.0 in | Wt 123.0 lb

## 2019-12-22 DIAGNOSIS — N898 Other specified noninflammatory disorders of vagina: Secondary | ICD-10-CM

## 2019-12-22 DIAGNOSIS — B9689 Other specified bacterial agents as the cause of diseases classified elsewhere: Secondary | ICD-10-CM | POA: Diagnosis not present

## 2019-12-22 DIAGNOSIS — N760 Acute vaginitis: Secondary | ICD-10-CM | POA: Diagnosis not present

## 2019-12-22 DIAGNOSIS — Z23 Encounter for immunization: Secondary | ICD-10-CM | POA: Diagnosis not present

## 2019-12-22 LAB — WET PREP FOR TRICH, YEAST, CLUE
Clue Cell Exam: POSITIVE — AB
Trichomonas Exam: NEGATIVE
Yeast Exam: NEGATIVE

## 2019-12-22 MED ORDER — METRONIDAZOLE 500 MG PO TABS: 500.0000 mg | ORAL_TABLET | Freq: Two times a day (BID) | ORAL | 0 refills | Status: DC

## 2019-12-22 NOTE — Progress Notes (Signed)
Subjective:    Patient ID: Ashley [name], female    DOB: Jul 20, 1965, 54 y.o.   MRN: [id number]   Chief Complaint: Vaginal Discharge   HPI Patient actually comes in with 2 complaints: -Vaginal discharge and burning x 3 days. Thick white discharge with a slight foul smell. Monistat cream and oils without relief. New sexual partner. Denies any adb pain or tenderness. Denies frequency or urgency.  Hx of STIs. - Pt also c/o left knee pain due to fall x 1 month ago. Has been wearing knee sleeve and taking tylenol without relief. No notable deformity or swelling.    Review of Systems  Constitutional: Negative.   HENT: Negative.   Eyes: Negative.   Respiratory: Negative.   Cardiovascular: Negative.   Gastrointestinal: Negative.   Endocrine: Negative.   Genitourinary: Positive for vaginal discharge.  Musculoskeletal: Negative.   Skin: Negative.   Allergic/Immunologic: Negative.   Neurological: Negative.   Hematological: Negative.   Psychiatric/Behavioral: Negative.   All other systems reviewed and are negative.      Objective:   Physical Exam Vitals and nursing note reviewed.  Constitutional:      Appearance: Normal appearance.  HENT:     Head: Normocephalic and atraumatic.     Right Ear: External ear normal.     Left Ear: External ear normal.     Nose: Nose normal.     Mouth/Throat:     Mouth: Mucous membranes are moist.     Pharynx: Oropharynx is clear.  Eyes:     Pupils: Pupils are equal, round, and reactive to light.  Cardiovascular:     Rate and Rhythm: Normal rate and regular rhythm.     Pulses: Normal pulses.     Heart sounds: Normal heart sounds.  Pulmonary:     Effort: Pulmonary effort is normal.     Breath sounds: Normal breath sounds.  Abdominal:     General: Abdomen is flat.     Palpations: Abdomen is soft.  Genitourinary:    General: Normal vulva.     Rectum: Normal.  Musculoskeletal:        General: Normal range of motion.     Cervical back:  Normal range of motion.     Left knee: Tenderness present.       Legs:  Skin:    General: Skin is warm and dry.     Capillary Refill: Capillary refill takes less than 2 seconds.  Neurological:     General: No focal deficit present.     Mental Status: She is alert and oriented to person, place, and time. Mental status is at baseline.  Psychiatric:        Mood and Affect: Mood normal.        Behavior: Behavior normal.        Thought Content: Thought content normal.        Judgment: Judgment normal.    BP 114/73    Pulse 61    Temp 97.7 F (36.5 C) (Temporal)    Resp 20    Ht 4\' 11"  (1.499 m)    Wt 123 lb (55.8 kg)    SpO2 98%    BMI 24.84 kg/m       Assessment & Plan:  Ashley [name] in today with chief complaint of Vaginal Discharge   1. Vaginal discharge - WET PREP FOR TRICH, YEAST, CLUE - Chlamydia/Gonococcus/Trichomonas, NAA - STD Screen (8)  2. Bacterial vaginosis Meds ordered this encounter  Medications  metroNIDAZOLE (FLAGYL) 500 MG tablet    Sig: Take 1 tablet (500 mg total) by mouth 2 (two) times daily.    Dispense:  14 tablet    Refill:  0    Order Specific Question:   Supervising Provider    Answer:   Caryl Pina A [id number]   Safe sex reviewed    The above assessment and management plan was discussed with the patient. The patient verbalized understanding of and has agreed to the management plan. Patient is aware to call the clinic if symptoms persist or worsen. Patient is aware when to return to the clinic for a follow-up visit. Patient educated on when it is appropriate to go to the emergency department.   Mary-Margaret Hassell Done, FNP

## 2019-12-22 NOTE — Patient Instructions (Signed)
Preventing Sexually Transmitted Infections, Adult Sexually transmitted infections (STIs) are diseases that are passed (transmitted) from person to person through bodily fluids exchanged during sex or sexual contact. Bodily fluids include saliva, semen, blood, vaginal mucus, and urine. You may have an increased risk for developing an STI if you have unprotected oral, vaginal, or anal sex. Some common STIs include:  Herpes.  Hepatitis B.  Chlamydia.  Gonorrhea.  Syphilis.  HPV (human papillomavirus).  HIV (human immunodeficiency virus), the virus that can cause AIDS (acquired immunodeficiency syndrome). How can I protect myself from sexually transmitted infections? The only way to completely prevent STIs is not to have sex of any kind (practice abstinence). This includes oral, vaginal, or anal sex. If you are sexually active, take these actions to lower your risk of getting an STI:  Have only one sex partner (be monogamous) or limit the number of sexual partners you have.  Stay up-to-date on immunizations. Certain vaccines can lower your risk of getting certain STIs, such as: ? Hepatitis A and B vaccines. You may have been vaccinated as a young child, but likely need a booster shot as a teen or young adult. ? HPV vaccine.  Use methods that prevent the exchange of body fluids between partners (barrier protection) every time you have sex. Barrier protection can be used during oral, vaginal, or anal sex. Commonly used barrier methods include: ? Female condom. ? Female condom. ? Dental dam.  Get tested regularly for STIs. Have your sexual partner get tested regularly as well.  Avoid mixing alcohol, drugs, and sex. Alcohol and drug use can affect your ability to make good decisions and can lead to risky sexual behaviors.  Ask your health care provider about taking pre-exposure prophylaxis (PrEP) to prevent HIV infection if you: ? Have a HIV-positive sexual partner. ? Have multiple sexual  partners or partners who do not know their HIV status, and do not regularly use a condom during sex. ? Use injection drugs and share needles. Birth control pills, injections, implants, and intrauterine devices (IUDs) do not protect against STIs. To prevent both STIs and pregnancy, always use a condom with another form of birth control. Some STIs, such as herpes, are spread through skin to skin contact. A condom does not protect you from getting such STIs. If you or your partner have herpes and there is an active flare with open sores, avoid all sexual contact. Why are these changes important? Taking steps to practice safe sex protects you and others. Many STIs can be cured. However, some STIs are not curable and will affect you for the rest of your life. STIs can be passed on to another person even if you do not have symptoms. What can happen if changes are not made? Certain STIs may:  Require you to take medicine for the rest of your life.  Affect your ability to have children (your fertility).  Increase your risk for developing another STI or certain serious health conditions, such as: ? Cervical cancer. ? Head and neck cancer. ? Pelvic inflammatory disease (PID) in women. ? Organ damage or damage to other parts of your body, if the infection spreads.  Be passed to a baby during childbirth. How are sexually transmitted infections treated? If you or your partner know or think that you may have an STI:  Talk with your health care provider about what can be done to treat it. Some STIs can be treated and cured with medicines.  For curable STIs, you and   your partner should avoid sex during treatment and for several days after treatment is complete.  You and your partner should both be treated at the same time, if there is any chance that your partner is infected as well. If you get treatment but your partner does not, your partner can re-infect you when you resume sexual contact.  Do not  have unprotected sex. Where to find more information Learn more about sexually transmitted diseases and infections from:  Centers for Disease Control and Prevention: ? More information about specific STIs: www.cdc.gov/std ? Find places to get sexual health counseling and treatment for free or for a low cost: gettested.cdc.gov  U.S. Department of Health and Human Services: www.womenshealth.gov/publications/our-publications/fact-sheet/sexually-transmitted-infections.html Summary  The only way to completely prevent STIs is not to have sex (practice abstinence), including oral, vaginal, or anal sex.  STIs can spread through saliva, semen, blood, vaginal mucus, urine, or sexual contact.  If you do have sex, limit your number of sexual partners and use a barrier protection method every time you have sex.  If you develop an STI, get treated right away and ask your partner to be treated as well. Do not resume having sex until both of you have completed treatment for the STI. This information is not intended to replace advice given to you by your health care provider. Make sure you discuss any questions you have with your health care provider. Document Revised: 07/09/2018 Document Reviewed: 02/10/2016 Elsevier Patient Education  2020 Elsevier Inc.  

## 2019-12-22 NOTE — Addendum Note (Signed)
Addended by: Chevis Pretty on: 12/22/2019 10:15 AM   Modules accepted: Orders

## 2019-12-23 ENCOUNTER — Other Ambulatory Visit: Payer: Self-pay | Admitting: Internal Medicine

## 2019-12-23 LAB — HIV 1/2 AB DIFFERENTIATION
HIV 1 Ab: POSITIVE — AB
HIV 2 Ab: NEGATIVE
NOTE (HIV CONF MULTIP: POSITIVE — AB

## 2019-12-23 LAB — STD SCREEN (8)
HIV Screen 4th Generation wRfx: REACTIVE — AB
HSV 1 Glycoprotein G Ab, IgG: <0.91 index (ref 0.00–0.90)
HSV 2 IgG, Type Spec: 12.1 index — ABNORMAL HIGH (ref 0.00–0.90)
Hep A IgM: NEGATIVE
Hep B C IgM: NEGATIVE
Hep C Virus Ab: >11 s/co ratio — ABNORMAL HIGH (ref 0.0–0.9)
Hepatitis B Surface Ag: NEGATIVE
RPR Ser Ql: NONREACTIVE

## 2019-12-25 LAB — URINE CYTOLOGY ANCILLARY ONLY
Chlamydia: NEGATIVE
Comment: NEGATIVE
Comment: NEGATIVE
Comment: NORMAL
Neisseria Gonorrhea: NEGATIVE
Trichomonas: NEGATIVE

## 2020-01-12 ENCOUNTER — Other Ambulatory Visit: Payer: Medicaid Other

## 2020-01-20 ENCOUNTER — Other Ambulatory Visit: Payer: Self-pay

## 2020-01-20 ENCOUNTER — Other Ambulatory Visit: Payer: Medicaid Other

## 2020-01-20 DIAGNOSIS — B20 Human immunodeficiency virus [HIV] disease: Secondary | ICD-10-CM

## 2020-01-21 LAB — T-HELPER CELL (CD4) - (RCID CLINIC ONLY)
CD4 % Helper T Cell: 28 % — ABNORMAL LOW (ref 33–65)
CD4 T Cell Abs: 746 /uL (ref 400–1790)

## 2020-01-23 LAB — HIV-1 RNA QUANT-NO REFLEX-BLD
HIV 1 RNA Quant: 72 Copies/mL — ABNORMAL HIGH
HIV-1 RNA Quant, Log: 1.86 Log cps/mL — ABNORMAL HIGH

## 2020-01-26 ENCOUNTER — Encounter: Payer: Medicaid Other | Admitting: Internal Medicine

## 2020-01-27 ENCOUNTER — Other Ambulatory Visit: Payer: Self-pay | Admitting: Family Medicine

## 2020-02-23 ENCOUNTER — Other Ambulatory Visit: Payer: Self-pay | Admitting: Family Medicine

## 2020-03-04 ENCOUNTER — Other Ambulatory Visit: Payer: Self-pay | Admitting: Family Medicine

## 2020-03-04 DIAGNOSIS — J439 Emphysema, unspecified: Secondary | ICD-10-CM

## 2020-03-05 ENCOUNTER — Other Ambulatory Visit: Payer: Self-pay | Admitting: Family Medicine

## 2020-03-12 ENCOUNTER — Other Ambulatory Visit: Payer: Self-pay | Admitting: Family Medicine

## 2020-03-15 ENCOUNTER — Other Ambulatory Visit: Payer: Self-pay | Admitting: Family Medicine

## 2020-03-16 ENCOUNTER — Other Ambulatory Visit: Payer: Self-pay | Admitting: Internal Medicine

## 2020-03-16 MED ORDER — BIKTARVY 50-200-25 MG PO TABS
1.0000 | ORAL_TABLET | Freq: Every day | ORAL | 5 refills | Status: DC
Start: 2020-03-16 — End: 2020-08-25

## 2020-03-16 NOTE — Telephone Encounter (Signed)
It has been sent, thanks 

## 2020-03-16 NOTE — Telephone Encounter (Signed)
Patient is needing a refill on this medication.

## 2020-03-16 NOTE — Telephone Encounter (Signed)
Please send refill request to her ID doctor.  I do not manage her HIV

## 2020-03-22 ENCOUNTER — Other Ambulatory Visit: Payer: Self-pay

## 2020-03-22 ENCOUNTER — Ambulatory Visit (INDEPENDENT_AMBULATORY_CARE_PROVIDER_SITE_OTHER): Payer: Medicaid Other | Admitting: Internal Medicine

## 2020-03-22 ENCOUNTER — Encounter: Payer: Self-pay | Admitting: Internal Medicine

## 2020-03-22 VITALS — BP 106/68 | HR 71 | Temp 98.0°F | Resp 17 | Ht 59.0 in | Wt 130.4 lb

## 2020-03-22 DIAGNOSIS — F1910 Other psychoactive substance abuse, uncomplicated: Secondary | ICD-10-CM

## 2020-03-22 DIAGNOSIS — Z113 Encounter for screening for infections with a predominantly sexual mode of transmission: Secondary | ICD-10-CM | POA: Diagnosis not present

## 2020-03-22 DIAGNOSIS — Z79899 Other long term (current) drug therapy: Secondary | ICD-10-CM

## 2020-03-22 DIAGNOSIS — B20 Human immunodeficiency virus [HIV] disease: Secondary | ICD-10-CM | POA: Diagnosis not present

## 2020-03-22 NOTE — Assessment & Plan Note (Signed)
She continues to remain drug free and doing well.

## 2020-03-22 NOTE — Progress Notes (Signed)
   Subjective:    Patient ID: Ashley [name], female    DOB: Jun 03, 1965, 55 y.o.   MRN: [id number]  HPI Here for follow up of HIV She continues on Biktarvy with no missed doses.  CD4 of 746 and viral load just 72 copies.  No new issues.  She has been drug free now nearly 3 years (in May).  Has not yet had the COVID vaccine.  Did get her flu shot.    Review of Systems  Constitutional: Negative for unexpected weight change.  Gastrointestinal: Negative for diarrhea and nausea.  Skin: Negative for rash.       Objective:   Physical Exam Eyes:     General: No scleral icterus. Cardiovascular:     Rate and Rhythm: Normal rate and regular rhythm.  Pulmonary:     Effort: Pulmonary effort is normal.  Neurological:     Mental Status: She is alert.  Psychiatric:        Mood and Affect: Mood normal.   SH: remains drug free       Assessment & Plan:

## 2020-03-22 NOTE — Assessment & Plan Note (Signed)
She continues to do well and reassuring labs.  She will continue and rtc in 6 months.

## 2020-03-24 ENCOUNTER — Other Ambulatory Visit: Payer: Self-pay | Admitting: Family Medicine

## 2020-04-20 ENCOUNTER — Other Ambulatory Visit: Payer: Self-pay | Admitting: Family Medicine

## 2020-04-30 ENCOUNTER — Other Ambulatory Visit: Payer: Self-pay | Admitting: *Deleted

## 2020-04-30 DIAGNOSIS — B20 Human immunodeficiency virus [HIV] disease: Secondary | ICD-10-CM

## 2020-04-30 DIAGNOSIS — B182 Chronic viral hepatitis C: Secondary | ICD-10-CM

## 2020-04-30 DIAGNOSIS — R32 Unspecified urinary incontinence: Secondary | ICD-10-CM

## 2020-04-30 DIAGNOSIS — R634 Abnormal weight loss: Secondary | ICD-10-CM

## 2020-04-30 NOTE — Progress Notes (Signed)
Need for DME orders for aging, disability and transit services. Request for 3 orders.

## 2020-05-17 ENCOUNTER — Other Ambulatory Visit: Payer: Self-pay

## 2020-05-17 ENCOUNTER — Encounter: Payer: Self-pay | Admitting: Family Medicine

## 2020-05-17 ENCOUNTER — Ambulatory Visit (INDEPENDENT_AMBULATORY_CARE_PROVIDER_SITE_OTHER): Payer: Medicaid Other | Admitting: Family Medicine

## 2020-05-17 VITALS — BP 108/73 | HR 78 | Temp 98.1°F | Resp 20 | Ht 59.0 in | Wt 128.0 lb

## 2020-05-17 DIAGNOSIS — E1159 Type 2 diabetes mellitus with other circulatory complications: Secondary | ICD-10-CM | POA: Diagnosis not present

## 2020-05-17 DIAGNOSIS — Z5181 Encounter for therapeutic drug level monitoring: Secondary | ICD-10-CM | POA: Diagnosis not present

## 2020-05-17 DIAGNOSIS — F333 Major depressive disorder, recurrent, severe with psychotic symptoms: Secondary | ICD-10-CM

## 2020-05-17 DIAGNOSIS — N898 Other specified noninflammatory disorders of vagina: Secondary | ICD-10-CM

## 2020-05-17 DIAGNOSIS — I152 Hypertension secondary to endocrine disorders: Secondary | ICD-10-CM

## 2020-05-17 DIAGNOSIS — E119 Type 2 diabetes mellitus without complications: Secondary | ICD-10-CM | POA: Diagnosis not present

## 2020-05-17 DIAGNOSIS — J439 Emphysema, unspecified: Secondary | ICD-10-CM

## 2020-05-17 DIAGNOSIS — K120 Recurrent oral aphthae: Secondary | ICD-10-CM

## 2020-05-17 LAB — BAYER DCA HB A1C WAIVED: HB A1C (BAYER DCA - WAIVED): 5.2 % (ref <7.0)

## 2020-05-17 MED ORDER — TRIAMCINOLONE ACETONIDE 0.1 % MT PSTE: 1.0000 "application " | PASTE | Freq: Two times a day (BID) | OROMUCOSAL | 0 refills | Status: AC

## 2020-05-17 MED ORDER — ALBUTEROL SULFATE HFA 108 (90 BASE) MCG/ACT IN AERS: 2.0000 | INHALATION_SPRAY | Freq: Four times a day (QID) | RESPIRATORY_TRACT | 3 refills | Status: DC | PRN

## 2020-05-17 NOTE — Progress Notes (Signed)
Subjective: CC:DM PCP: Janora Norlander, DO DXI:PJASN Ashley [name] is a 55 y.o. female presenting to clinic today for:  1. Diet controlled Type 2 Diabetes with hypertension:  Notes that she has been out of her Norvasc for 2 weeks.  No reports of polyuria, polydipsia, visual disturbance, chest pain or shortness of breath  Last eye exam: Needs Last foot exam: Needs  Last A1c:  Lab Results  Component Value Date   HGBA1C 5.5 06/20/2019   Nephropathy screen indicated?:  Needs Last flu, zoster and/or pneumovax:  Immunization History  Administered Date(s) Administered  . Hepatitis A 02/18/2002, 04/22/2002, 11/06/2005  . Hepatitis B 02/18/2002, 04/22/2002, 11/06/2005  . Influenza Split 12/19/2011  . Influenza,inj,Quad PF,6+ Mos 02/02/2015, 12/17/2015, 12/09/2018, 12/22/2019  . Pneumococcal Conjugate-13 10/17/2017  . Pneumococcal Polysaccharide-23 11/06/2005, 06/11/2009  . Rabies, IM 04/19/2014, 04/22/2014, 05/09/2014  . Td 07/18/1988  . Tdap 11/06/2005    2.  Vaginal lesion Patient reports that she had a vaginal lesion on the right side of her vagina.  She squeezed it and it had some fluid come out of it.  She is had some mild soreness but this seems to be getting better.  Currently using over-the-counter Monistat but wants to know she needs any other medications for this.  3.  Bronchospasm/ emphysema Patient has seasonal bronchospasm.  She is asking for refill on her albuterol inhaler.  4.  HIV HIV levels have been stable.  She is compliant with Biktarvy.  No reports of unplanned weight loss, night sweats  5.  Depressive mood disorder Patient is compliant with Seroquel, Prozac and trazodone.  She is followed remotely by a specialist in [place].  ROS: Per HPI  Allergies  Allergen Reactions  . Aspirin Other (See Comments)    Liver problems  . Roxicodone [Oxycodone Hcl] Itching  . Tomato    Past Medical History:  Diagnosis Date  . Ankle fracture   . Anxiety   .  Asthma   . Collagen vascular disease ([place])   . COPD (chronic obstructive pulmonary disease) ([place])   . Depression   . Diabetes mellitus   . Elevated LFTs 01/13/2012  . GERD (gastroesophageal reflux disease)   . Hepatitis C   . Hernia   . HIV (human immunodeficiency virus infection) ([place])   . HIV (human immunodeficiency virus infection) ([place])   . Hypertension   . Hypothyroidism   . Pinched nerve   . Scoliosis   . TB (tuberculosis)     Current Outpatient Medications:  .  albuterol (PROAIR HFA) 108 (90 Base) MCG/ACT inhaler, Inhale 2 puffs into the lungs every 6 (six) hours as needed for wheezing or shortness of breath. (Needs to be seen before next refill), Disp: 8.5 g, Rfl: 0 .  amLODipine (NORVASC) 10 MG tablet, TAKE 1 TABLET BY MOUTH EVERY DAY, Disp: 90 tablet, Rfl: 0 .  bictegravir-emtricitabine-tenofovir AF (BIKTARVY) 50-200-25 MG TABS tablet, Take 1 tablet by mouth daily., Disp: 30 tablet, Rfl: 5 .  cetirizine (ZYRTEC) 10 MG tablet, TAKE ONE TABLET BY MOUTH DAILY, Disp: 90 tablet, Rfl: 3 .  docusate sodium (COLACE) 100 MG capsule, Take 1 capsule (100 mg total) by mouth 2 (two) times daily., Disp: 60 capsule, Rfl: 2 .  FLUoxetine (PROZAC) 20 MG capsule, TAKE ONE CAPSULE BY MOUTH DAILY, Disp: 90 capsule, Rfl: 3 .  fluticasone (FLONASE) 50 MCG/ACT nasal spray, PLACE 2 SPRAYS INTO BOTH NOSTRILS DAILY, Disp: 48 g, Rfl: 3 .  loratadine (CLARITIN) 10 MG tablet, Take 1  tablet (10 mg total) by mouth daily., Disp: 30 tablet, Rfl: 1 .  Multiple Vitamin (TAB-A-VITE) TABS, TAKE ONE TABLET BY MOUTH DAILY, Disp: 90 tablet, Rfl: 1 .  pantoprazole (PROTONIX) 40 MG tablet, Take 1 tablet (40 mg total) by mouth 2 (two) times daily before a meal., Disp: 60 tablet, Rfl: 3 .  QUEtiapine (SEROQUEL) 300 MG tablet, Take 1 tablet (300 mg total) by mouth at bedtime., Disp: 30 tablet, Rfl: 1 Social History   Socioeconomic History  . Marital status: Single    Spouse name: Not on file  . Number of children: 2   . Years of education: Not on file  . Highest education level: Not on file  Occupational History  . Occupation: Disability  Tobacco Use  . Smoking status: Current Every Day Smoker    Packs/day: 1.00    Years: 35.00    Pack years: 35.00    Types: Cigarettes  . Smokeless tobacco: Never Used  . Tobacco comment: 1 pack every 2-3 days  Vaping Use  . Vaping Use: Never used  Substance and Sexual Activity  . Alcohol use: Not Currently    Alcohol/week: 3.0 standard drinks    Types: 3 Cans of beer per week  . Drug use: Not Currently    Types: Marijuana, Cocaine    Comment: last cocaine was 07/15/2017.  [place] Sexual activity: Not Currently    Birth control/protection: Condom, None    Comment: condoms offered, condoms and lube provided  Other Topics Concern  . Not on file  Social History Narrative   Pt lives in an apartment by herself.  Pt stated that she is married and that her husband is in a [employer], and that husband is being released yesterday or today.  Asked several times who Pt's psychiatric provider is.   Social Determinants of Health   Financial Resource Strain: Not on file  Food Insecurity: Not on file  Transportation Needs: Not on file  Physical Activity: Not on file  Stress: Not on file  Social Connections: Not on file  Intimate Partner Violence: Not on file   Family History  Problem Relation Age of Onset  . Diabetes Mother   . Hypertension Mother   . Stroke Mother        died 09-18-2013 . Cancer Sister   . Cancer Maternal Aunt     Objective: Office vital signs reviewed. BP 108/73   Pulse 78   Temp 98.1 F (36.7 C)   Resp 20   Ht 4\' 11"  (1.499 m)   Wt 128 lb (58.1 kg)   SpO2 97%   BMI 25.85 kg/m   Physical Examination:  General: Awake, alert, No acute distress HEENT: Normal; sclera white; she has a small ulceration noted along the medial lateral lower gumline consistent with a canker sore Cardio: regular rate and rhythm, S1S2 heard, no murmurs  appreciated Pulm: clear to auscultation bilaterally, no wheezes, rhonchi or rales; normal work of breathing on room air GU: Atrophic external vaginal mucosa.  She has a small healing ulceration noted at the base of the right labia majora.  No discharge noted Psych: Mood is stable.  Patient is very entertaining during today's visit.  She is very blunt about her thoughts and feelings.  She does not appear to be responding to internal stimuli  Assessment/ Plan: 55 y.o. female   Hypertension associated with diabetes ([place])  Diet-controlled diabetes mellitus ([place]) - Plan: Microalbumin / creatinine urine ratio, Bayer DCA Hb A1c  Waived  MDD (major depressive disorder), recurrent, severe, with psychosis ([place]) - Plan: EKG 12-Lead  Medication monitoring encounter - Plan: EKG 12-Lead  Pulmonary emphysema, unspecified emphysema type ([place]) - Plan: albuterol (PROAIR HFA) 108 (90 Base) MCG/ACT inhaler  Vaginal lesion  Canker sores oral - Plan: triamcinolone (KENALOG) 0.1 % paste  Blood pressures well controlled without medicine.  No need to resume Norvasc.  This has been discontinued  A1c continues to be under excellent control.  Check urine microalbumin  Check EKG given use of Seroquel.  Albuterol inhaler renewed  Lesion of concern appears to be a healing cyst.  No evidence of active bacterial infection.  What appears to be a canker sore noted along the left lower gumline.  Triamcinolone applied topically sent to pharmacy  Orders Placed This Encounter  Procedures  . Microalbumin / creatinine urine ratio  . Bayer DCA Hb A1c Waived  . EKG 12-Lead   Meds ordered this encounter  Medications  . albuterol (PROAIR HFA) 108 (90 Base) MCG/ACT inhaler    Sig: Inhale 2 puffs into the lungs every 6 (six) hours as needed for wheezing or shortness of breath.    Dispense:  8.5 g    Refill:  3  . triamcinolone (KENALOG) 0.1 % paste    Sig: Use as directed 1 application in the mouth or throat 2 (two)  times daily for 3 days.    Dispense:  5 g    Refill:  0   Medications Discontinued During This Encounter  Medication Reason  . metroNIDAZOLE (FLAGYL) 500 MG tablet Completed Course  . amLODipine (NORVASC) 10 MG tablet   . albuterol [facility] HFA) 108 (90 Base) MCG/ACT inhaler Reorder    Janora Norlander, [place] [phone]

## 2020-05-18 ENCOUNTER — Telehealth: Payer: Self-pay | Admitting: *Deleted

## 2020-05-18 LAB — MICROALBUMIN / CREATININE URINE RATIO
Creatinine, Urine: 432.4 mg/dL
Microalb/Creat Ratio: 36 mg/g creat — ABNORMAL HIGH (ref 0–29)
Microalbumin, Urine: 155.2 ug/mL

## 2020-05-18 MED ORDER — MONISTAT 3 COMBO PACK APP 200 & 2 MG-% (9GM) VA KIT
PACK | VAGINAL | 0 refills | Status: DC
Start: 2020-05-18 — End: 2020-06-15

## 2020-05-18 NOTE — Telephone Encounter (Signed)
Please order/ send in Monistat 7 to [facility] on file.  States it is $3 if she has RX

## 2020-05-21 ENCOUNTER — Telehealth: Payer: Self-pay

## 2020-05-21 ENCOUNTER — Other Ambulatory Visit: Payer: Self-pay | Admitting: Family Medicine

## 2020-05-21 MED ORDER — TIZANIDINE HCL 4 MG PO TABS: 2.0000 mg | ORAL_TABLET | Freq: Three times a day (TID) | ORAL | 0 refills | Status: DC | PRN

## 2020-05-21 NOTE — Telephone Encounter (Signed)
Please advise on muscle relaxer, Monistat was sent in on 05/18/20

## 2020-06-14 ENCOUNTER — Telehealth: Payer: Self-pay | Admitting: Family Medicine

## 2020-06-14 NOTE — Telephone Encounter (Signed)
Wants to talk to nurse about finding a dentist and her medicine

## 2020-06-15 MED ORDER — MONISTAT 3 COMBO PACK APP 200 & 2 MG-% (9GM) VA KIT: PACK | VAGINAL | 0 refills | Status: DC

## 2020-06-15 NOTE — Telephone Encounter (Signed)
Called pt - aware of 2 local dentist  Never was able to pick up monistat after last OV - resent to pharm

## 2020-06-29 ENCOUNTER — Other Ambulatory Visit: Payer: Self-pay | Admitting: Family Medicine

## 2020-06-29 DIAGNOSIS — Z1231 Encounter for screening mammogram for malignant neoplasm of breast: Secondary | ICD-10-CM

## 2020-07-02 ENCOUNTER — Other Ambulatory Visit: Payer: Self-pay | Admitting: Family Medicine

## 2020-07-02 DIAGNOSIS — J439 Emphysema, unspecified: Secondary | ICD-10-CM

## 2020-07-05 ENCOUNTER — Other Ambulatory Visit (INDEPENDENT_AMBULATORY_CARE_PROVIDER_SITE_OTHER): Payer: Self-pay

## 2020-07-05 DIAGNOSIS — K219 Gastro-esophageal reflux disease without esophagitis: Secondary | ICD-10-CM

## 2020-07-05 MED ORDER — PANTOPRAZOLE SODIUM 40 MG PO TBEC: 40.0000 mg | DELAYED_RELEASE_TABLET | Freq: Two times a day (BID) | ORAL | 3 refills | Status: DC

## 2020-07-08 ENCOUNTER — Other Ambulatory Visit: Payer: Self-pay | Admitting: Family Medicine

## 2020-07-14 ENCOUNTER — Telehealth: Payer: Self-pay

## 2020-07-14 NOTE — Telephone Encounter (Signed)
Patient given the name of [facility] in [place]. Patient notified that I could not promise that they still took medicaid but on the web site it says they do.

## 2020-08-01 ENCOUNTER — Other Ambulatory Visit: Payer: Self-pay | Admitting: Family Medicine

## 2020-08-25 ENCOUNTER — Other Ambulatory Visit: Payer: Self-pay | Admitting: Internal Medicine

## 2020-08-25 DIAGNOSIS — B20 Human immunodeficiency virus [HIV] disease: Secondary | ICD-10-CM

## 2020-08-27 ENCOUNTER — Other Ambulatory Visit: Payer: Self-pay | Admitting: Family Medicine

## 2020-08-27 DIAGNOSIS — J439 Emphysema, unspecified: Secondary | ICD-10-CM

## 2020-09-17 ENCOUNTER — Other Ambulatory Visit: Payer: Self-pay | Admitting: Family Medicine

## 2020-09-21 ENCOUNTER — Telehealth: Payer: Self-pay | Admitting: *Deleted

## 2020-09-21 ENCOUNTER — Ambulatory Visit: Payer: Medicaid Other | Admitting: Internal Medicine

## 2020-09-21 NOTE — Telephone Encounter (Signed)
Called patient about today's missed visit. She states she did not have gas money, has to wait until 09/29/20 for a gas voucher. She rescheduled for 10/12/20 at 11:15 am, as she needs a morning appointment for her ride. Landis Gandy, RN

## 2020-09-23 ENCOUNTER — Other Ambulatory Visit: Payer: Self-pay | Admitting: Internal Medicine

## 2020-09-23 DIAGNOSIS — B20 Human immunodeficiency virus [HIV] disease: Secondary | ICD-10-CM

## 2020-09-23 NOTE — Telephone Encounter (Signed)
Called and advised patient that I can send in enough Biktarvy to last until her appt 10/12/20 but she will need to keep appt for more refills. I also verified that she received her bus passes and she did verbally admit to having them now. Sent 30 day refill.

## 2020-10-06 ENCOUNTER — Encounter: Payer: Self-pay | Admitting: Family Medicine

## 2020-10-06 ENCOUNTER — Ambulatory Visit (INDEPENDENT_AMBULATORY_CARE_PROVIDER_SITE_OTHER): Payer: Medicaid Other | Admitting: Family Medicine

## 2020-10-06 DIAGNOSIS — J069 Acute upper respiratory infection, unspecified: Secondary | ICD-10-CM | POA: Diagnosis not present

## 2020-10-06 MED ORDER — AZITHROMYCIN 250 MG PO TABS: ORAL_TABLET | ORAL | 0 refills | Status: DC

## 2020-10-06 NOTE — Progress Notes (Signed)
   Virtual Visit  Note Due to COVID-19 pandemic this visit was conducted virtually. This visit type was conducted due to national recommendations for restrictions regarding the COVID-19 Pandemic (e.g. social distancing, sheltering in place) in an effort to limit this patient's exposure and mitigate transmission in our community. All issues noted in this document were discussed and addressed.  A physical exam was not performed with this format.  I connected with Ashley [name] on 10/06/20 at 1554 by telephone and verified that I am speaking with the correct person using two identifiers. Ashley [name] is currently located at home and no one is currently with her during the visit. The provider, Gwenlyn Perking, FNP is located in their office at time of visit.  I discussed the limitations, risks, security and privacy concerns of performing an evaluation and management service by telephone and the availability of in person appointments. I also discussed with the patient that there may be a patient responsible charge related to this service. The patient expressed understanding and agreed to proceed.  CC: cough  History and Present Illness:  HPI Ashley [name] reports cough and congestion x 2 days. She also reports a scratchy throat. Temperature was 99.3 when she checked it recently. She denies fever, body aches, chills, nausea, vomiting, diarrhea, chest pain, or shortness of breath. She report that she had contact with multiple people 5 days ago that are now sick. She is unsure if any of them have Covid. She has not been vaccinated against covid. She has been taking vitamins and advil. She has been staying well hydrated.     ROS As per HPI.   Observations/Objective: Alert and oriented x 3. Able to speak in full sentences without difficulty.    Assessment and Plan: Ashley [name] was seen today for cough.  Diagnoses and all orders for this visit:  URI with cough and congestion Covid test pending, quarantine  until results. Discussed symptomatic care. Will go ahead a send in [place] as she has a history of HIV, emphysema, and is a smoker. Discussed return precautions and when to seek emergency care.  -     azithromycin (ZITHROMAX Z-PAK) 250 MG tablet; As directed -     Novel Coronavirus, NAA ([facility]); Future    Follow Up Instructions: Return to office for new or worsening symptoms, or if symptoms persist.     I discussed the assessment and treatment plan with the patient. The patient was provided an opportunity to ask questions and all were answered. The patient agreed with the plan and demonstrated an understanding of the instructions.   The patient was advised to call back or seek an in-person evaluation if the symptoms worsen or if the condition fails to improve as anticipated.  The above assessment and management plan was discussed with the patient. The patient verbalized understanding of and has agreed to the management plan. Patient is aware to call the clinic if symptoms persist or worsen. Patient is aware when to return to the clinic for a follow-up visit. Patient educated on when it is appropriate to go to the emergency department.   Time call ended:  1606  I provided 12 minutes of  non face-to-face time during this encounter.    Gwenlyn Perking, FNP

## 2020-10-09 ENCOUNTER — Other Ambulatory Visit (INDEPENDENT_AMBULATORY_CARE_PROVIDER_SITE_OTHER): Payer: Self-pay | Admitting: Internal Medicine

## 2020-10-09 DIAGNOSIS — K219 Gastro-esophageal reflux disease without esophagitis: Secondary | ICD-10-CM

## 2020-10-12 ENCOUNTER — Other Ambulatory Visit: Payer: Self-pay

## 2020-10-12 ENCOUNTER — Encounter: Payer: Self-pay | Admitting: Internal Medicine

## 2020-10-12 ENCOUNTER — Ambulatory Visit (INDEPENDENT_AMBULATORY_CARE_PROVIDER_SITE_OTHER): Payer: Medicaid Other | Admitting: Internal Medicine

## 2020-10-12 VITALS — BP 165/105 | HR 77 | Temp 97.9°F | Wt 129.0 lb

## 2020-10-12 DIAGNOSIS — Z79899 Other long term (current) drug therapy: Secondary | ICD-10-CM | POA: Diagnosis present

## 2020-10-12 DIAGNOSIS — H938X2 Other specified disorders of left ear: Secondary | ICD-10-CM

## 2020-10-12 DIAGNOSIS — B20 Human immunodeficiency virus [HIV] disease: Secondary | ICD-10-CM | POA: Diagnosis not present

## 2020-10-12 DIAGNOSIS — F1910 Other psychoactive substance abuse, uncomplicated: Secondary | ICD-10-CM

## 2020-10-12 NOTE — Progress Notes (Signed)
   Subjective:    Patient ID: Ashley [name], female    DOB: 1965-07-01, 55 y.o.   MRN: [id number]  HPI Here for follow up of HIV She continues on Biktarvy and denies any missed doses.  No labs prior to the visit.  She has had recent stressors related to her son who was beat up and jailed during a protest in [place], but is better now.  She has had some back pain and ear discomfort as well.  Back pain is exacerbated by movement.  Uses cotton swabs in her ear. Asking if she needs more antibiotics.    Review of Systems  Constitutional:  Negative for fatigue.  Gastrointestinal:  Negative for diarrhea and nausea.  Skin:  Negative for rash.      Objective:   Physical Exam HENT:     Left Ear: Tympanic membrane and external ear normal.     Ears:     Comments: Some irritation in the ear canal Eyes:     General: No scleral icterus. Pulmonary:     Effort: Pulmonary effort is normal.  Neurological:     General: No focal deficit present.     Mental Status: She is alert.  Psychiatric:        Mood and Affect: Mood normal.   SH: remains drug free       Assessment & Plan:

## 2020-10-12 NOTE — Assessment & Plan Note (Signed)
Congratulated on remaining drug free

## 2020-10-12 NOTE — Assessment & Plan Note (Signed)
Ear irritation likely from inserting cotton swabs.  I advised to avoid this.  No indication for any antibiotics.

## 2020-10-12 NOTE — Assessment & Plan Note (Signed)
She continues to do well and no issues with the medication.  No changes and will get labs today and rtc in 6 months.

## 2020-10-13 ENCOUNTER — Encounter: Payer: Self-pay | Admitting: Family Medicine

## 2020-10-13 ENCOUNTER — Ambulatory Visit (INDEPENDENT_AMBULATORY_CARE_PROVIDER_SITE_OTHER): Payer: Medicaid Other | Admitting: Family Medicine

## 2020-10-13 DIAGNOSIS — H60502 Unspecified acute noninfective otitis externa, left ear: Secondary | ICD-10-CM | POA: Diagnosis not present

## 2020-10-13 DIAGNOSIS — M62830 Muscle spasm of back: Secondary | ICD-10-CM | POA: Diagnosis not present

## 2020-10-13 DIAGNOSIS — R21 Rash and other nonspecific skin eruption: Secondary | ICD-10-CM | POA: Diagnosis not present

## 2020-10-13 LAB — T-HELPER CELL (CD4) - (RCID CLINIC ONLY)
CD4 % Helper T Cell: 38 % (ref 33–65)
CD4 T Cell Abs: 886 /uL (ref 400–1790)

## 2020-10-13 MED ORDER — TIZANIDINE HCL 6 MG PO CAPS: 6.0000 mg | ORAL_CAPSULE | Freq: Three times a day (TID) | ORAL | 0 refills | Status: DC | PRN

## 2020-10-13 MED ORDER — CIPROFLOXACIN-DEXAMETHASONE 0.3-0.1 % OT SUSP: 4.0000 [drp] | Freq: Two times a day (BID) | OTIC | 0 refills | Status: DC

## 2020-10-13 MED ORDER — HYDROCORTISONE 0.5 % EX CREA: 1.0000 "application " | TOPICAL_CREAM | Freq: Two times a day (BID) | CUTANEOUS | 0 refills | Status: DC

## 2020-10-13 NOTE — Progress Notes (Signed)
Virtual Visit  Note Due to COVID-19 pandemic this visit was conducted virtually. This visit type was conducted due to national recommendations for restrictions regarding the COVID-19 Pandemic (e.g. social distancing, sheltering in place) in an effort to limit this patient's exposure and mitigate transmission in our community. All issues noted in this document were discussed and addressed.  A physical exam was not performed with this format.  I connected with Ashley [name] on 10/13/20 at [phone] by telephone and verified that I am speaking with the correct person using two identifiers. Ashley [name] is currently located at home and no one is currently with her during the visit. The provider, Gwenlyn Perking, FNP is located in their office at time of visit.  I discussed the limitations, risks, security and privacy concerns of performing an evaluation and management service by telephone and the availability of in person appointments. I also discussed with the patient that there may be a patient responsible charge related to this service. The patient expressed understanding and agreed to proceed.  CC: muscle spasms  History and Present Illness:  HPI Ashley [name] reports a history of muscle spasms in her lower back. They have been worse for the last 4 days. They are occurring on the left lower side of her back and are worse with certain positions or activity. She denies injury, edema, or saddle anesthesia. She has been taking zanaflex 4 mg with some relief, but it hasn't been as helpful as it usually is.   She also reports continued left ear pain. She saw her ID provider yesterday. His note reports some irritation to the left ear canal, likely due to use of cotton swabs. She reports that she has not been usually cotton swabs. Denies drainage or fever.   She also reports a bug bit to her right foot. It has been itchy for the the last 2 weeks. She has used peroxide and alcohol to the area. Denies fever,  drainage, warmth, tenderness, or swelling.     ROS As per HPI.   Observations/Objective: Alert and oriented x 3. Able to speak in full sentences without difficulty.    Assessment and Plan: [place] was seen today for muscle pain.  Diagnoses and all orders for this visit:  Acute otitis externa of left ear, unspecified type Try ciprodex as below. Avoid using cotton swabs.  -     ciprofloxacin-dexamethasone (CIPRODEX) OTIC suspension; Place 4 drops into the left ear 2 (two) times daily for 7 days.  Muscle spasm of back Increase zanaflex to 6 mg. Heat, rest, NSAIDs.  -     tizanidine (ZANAFLEX) 6 MG capsule; Take 1 capsule (6 mg total) by mouth 3 (three) times daily as needed for muscle spasms.  Rash Try hydrocortisone.  -     hydrocortisone cream 0.5 %; Apply 1 application topically 2 (two) times daily.    Follow Up Instructions: Return to office for new or worsening symptoms, or if symptoms persist.     I discussed the assessment and treatment plan with the patient. The patient was provided an opportunity to ask questions and all were answered. The patient agreed with the plan and demonstrated an understanding of the instructions.   The patient was advised to call back or seek an in-person evaluation if the symptoms worsen or if the condition fails to improve as anticipated.  The above assessment and management plan was discussed with the patient. The patient verbalized understanding of and has agreed to the management plan.  Patient is aware to call the clinic if symptoms persist or worsen. Patient is aware when to return to the clinic for a follow-up visit. Patient educated on when it is appropriate to go to the emergency department.   Time call ended:  0955  I provided 12 minutes of  non face-to-face time during this encounter.    Gwenlyn Perking, FNP

## 2020-10-14 LAB — LIPID PANEL
Cholesterol: 198 mg/dL (ref <200)
HDL: 91 mg/dL (ref >=50)
LDL Cholesterol (Calc): 86 mg/dL (calc)
Non-HDL Cholesterol (Calc): 107 mg/dL (calc) (ref <130)
Total CHOL/HDL Ratio: 2.2 (calc) (ref <5.0)
Triglycerides: 109 mg/dL (ref <150)

## 2020-10-14 LAB — COMPLETE METABOLIC PANEL WITH GFR
AG Ratio: 2 (calc) (ref 1.0–2.5)
ALT: 16 U/L (ref 6–29)
AST: 35 U/L (ref 10–35)
Albumin: 4.7 g/dL (ref 3.6–5.1)
Alkaline phosphatase (APISO): 65 U/L (ref 37–153)
BUN: 13 mg/dL (ref 7–25)
CO2: 30 mmol/L (ref 20–32)
Calcium: 9.6 mg/dL (ref 8.6–10.4)
Chloride: 105 mmol/L (ref 98–110)
Creat: 0.95 mg/dL (ref 0.50–1.03)
Globulin: 2.3 g/dL (calc) (ref 1.9–3.7)
Glucose, Bld: 82 mg/dL (ref 65–99)
Potassium: 4.4 mmol/L (ref 3.5–5.3)
Sodium: 141 mmol/L (ref 135–146)
Total Bilirubin: 0.4 mg/dL (ref 0.2–1.2)
Total Protein: 7 g/dL (ref 6.1–8.1)
eGFR: 70 mL/min/{1.73_m2} (ref >=60)

## 2020-10-14 LAB — CBC WITH DIFFERENTIAL/PLATELET
Absolute Monocytes: 418 cells/uL (ref 200–950)
Basophils Absolute: 38 cells/uL (ref 0–200)
Basophils Relative: 0.8 %
Eosinophils Absolute: 99 cells/uL (ref 15–500)
Eosinophils Relative: 2.1 %
HCT: 37.4 % (ref 35.0–45.0)
Hemoglobin: 12.9 g/dL (ref 11.7–15.5)
Lymphs Abs: 2280 cells/uL (ref 850–3900)
MCH: 34.9 pg — ABNORMAL HIGH (ref 27.0–33.0)
MCHC: 34.5 g/dL (ref 32.0–36.0)
MCV: 101.1 fL — ABNORMAL HIGH (ref 80.0–100.0)
MPV: 10.6 fL (ref 7.5–12.5)
Monocytes Relative: 8.9 %
Neutro Abs: 1866 cells/uL (ref 1500–7800)
Neutrophils Relative %: 39.7 %
Platelets: 173 10*3/uL (ref 140–400)
RBC: 3.7 10*6/uL — ABNORMAL LOW (ref 3.80–5.10)
RDW: 12.6 % (ref 11.0–15.0)
Total Lymphocyte: 48.5 %
WBC: 4.7 10*3/uL (ref 3.8–10.8)

## 2020-10-14 LAB — RPR: RPR Ser Ql: NONREACTIVE

## 2020-10-14 LAB — HIV-1 RNA QUANT-NO REFLEX-BLD
HIV 1 RNA Quant: <20 Copies/mL — ABNORMAL HIGH
HIV-1 RNA Quant, Log: <1.3 Log cps/mL — ABNORMAL HIGH

## 2020-10-19 ENCOUNTER — Telehealth: Payer: Self-pay | Admitting: *Deleted

## 2020-10-19 MED ORDER — TIZANIDINE HCL 4 MG PO TABS: 4.0000 mg | ORAL_TABLET | Freq: Three times a day (TID) | ORAL | 0 refills | Status: DC | PRN

## 2020-10-19 NOTE — Telephone Encounter (Signed)
Order changed from 6 mg CAP to 4 mg TAB  Resent to pharm

## 2020-10-19 NOTE — Addendum Note (Signed)
Addended by: Zannie Cove on: 10/19/2020 01:59 PM   Modules accepted: Orders

## 2020-10-19 NOTE — Telephone Encounter (Signed)
Fax from [facility]: Tizanidine 6 mg capsules Message: capsules not covered, change to tablets Please advise, if appropriate send new Rx

## 2020-10-19 NOTE — Telephone Encounter (Signed)
Ok to replace with tablets.

## 2020-10-20 ENCOUNTER — Other Ambulatory Visit: Payer: Self-pay | Admitting: Internal Medicine

## 2020-10-20 DIAGNOSIS — B20 Human immunodeficiency virus [HIV] disease: Secondary | ICD-10-CM

## 2020-10-31 ENCOUNTER — Other Ambulatory Visit: Payer: Self-pay | Admitting: Family Medicine

## 2020-10-31 DIAGNOSIS — J439 Emphysema, unspecified: Secondary | ICD-10-CM

## 2020-11-15 ENCOUNTER — Encounter: Payer: Medicaid Other | Admitting: Family Medicine

## 2020-11-16 ENCOUNTER — Encounter: Payer: Self-pay | Admitting: Family Medicine

## 2020-11-22 ENCOUNTER — Other Ambulatory Visit: Payer: Self-pay | Admitting: Family Medicine

## 2020-12-29 ENCOUNTER — Other Ambulatory Visit: Payer: Self-pay | Admitting: Family Medicine

## 2021-01-03 ENCOUNTER — Inpatient Hospital Stay: Admission: RE | Admit: 2021-01-03 | Payer: Medicaid Other | Source: Ambulatory Visit

## 2021-01-04 ENCOUNTER — Ambulatory Visit (INDEPENDENT_AMBULATORY_CARE_PROVIDER_SITE_OTHER): Payer: Medicaid Other | Admitting: Family Medicine

## 2021-01-04 ENCOUNTER — Other Ambulatory Visit: Payer: Self-pay

## 2021-01-04 ENCOUNTER — Encounter: Payer: Self-pay | Admitting: Family Medicine

## 2021-01-04 ENCOUNTER — Other Ambulatory Visit (HOSPITAL_COMMUNITY)
Admission: RE | Admit: 2021-01-04 | Discharge: 2021-01-04 | Disposition: A | Discharge status: Home / Self Care | Payer: Medicaid Other | Source: Ambulatory Visit | Attending: Family Medicine | Admitting: Family Medicine

## 2021-01-04 VITALS — BP 150/94 | HR 78 | Temp 98.1°F | Ht 59.0 in | Wt 127.0 lb

## 2021-01-04 DIAGNOSIS — B20 Human immunodeficiency virus [HIV] disease: Secondary | ICD-10-CM | POA: Diagnosis not present

## 2021-01-04 DIAGNOSIS — Z124 Encounter for screening for malignant neoplasm of cervix: Secondary | ICD-10-CM | POA: Diagnosis not present

## 2021-01-04 DIAGNOSIS — Z23 Encounter for immunization: Secondary | ICD-10-CM | POA: Diagnosis not present

## 2021-01-04 DIAGNOSIS — N898 Other specified noninflammatory disorders of vagina: Secondary | ICD-10-CM

## 2021-01-04 DIAGNOSIS — E1159 Type 2 diabetes mellitus with other circulatory complications: Secondary | ICD-10-CM

## 2021-01-04 DIAGNOSIS — I152 Hypertension secondary to endocrine disorders: Secondary | ICD-10-CM

## 2021-01-04 DIAGNOSIS — J329 Chronic sinusitis, unspecified: Secondary | ICD-10-CM

## 2021-01-04 DIAGNOSIS — Z01419 Encounter for gynecological examination (general) (routine) without abnormal findings: Secondary | ICD-10-CM

## 2021-01-04 DIAGNOSIS — M170 Bilateral primary osteoarthritis of knee: Secondary | ICD-10-CM

## 2021-01-04 DIAGNOSIS — B182 Chronic viral hepatitis C: Secondary | ICD-10-CM | POA: Insufficient documentation

## 2021-01-04 DIAGNOSIS — J310 Chronic rhinitis: Secondary | ICD-10-CM

## 2021-01-04 DIAGNOSIS — E119 Type 2 diabetes mellitus without complications: Secondary | ICD-10-CM

## 2021-01-04 DIAGNOSIS — Z01411 Encounter for gynecological examination (general) (routine) with abnormal findings: Secondary | ICD-10-CM | POA: Diagnosis not present

## 2021-01-04 LAB — WET PREP FOR TRICH, YEAST, CLUE
Clue Cell Exam: NEGATIVE
Trichomonas Exam: NEGATIVE
Yeast Exam: NEGATIVE

## 2021-01-04 LAB — BAYER DCA HB A1C WAIVED: HB A1C (BAYER DCA - WAIVED): 5.1 % (ref 4.8–5.6)

## 2021-01-04 MED ORDER — LORATADINE 10 MG PO TABS: 10.0000 mg | ORAL_TABLET | Freq: Every day | ORAL | 3 refills | Status: DC

## 2021-01-04 MED ORDER — DICLOFENAC SODIUM 1 % EX GEL: 4.0000 g | Freq: Four times a day (QID) | CUTANEOUS | 2 refills | Status: DC | PRN

## 2021-01-04 MED ORDER — MOMETASONE FUROATE 50 MCG/ACT NA SUSP
2.0000 | Freq: Every day | NASAL | 12 refills | Status: DC
Start: 2021-01-04 — End: 2023-07-24

## 2021-01-04 NOTE — Progress Notes (Signed)
Ashley [name] is a 55 y.o. female presents to office today for annual physical exam examination.    Concerns today include: 1. Diet controlled type 2 Diabetes with hypertension:  DM and BP both diet controlled.  Continues to smoke regularly. Watches her diet though and tries to stay active.  Last eye exam: needs Last foot exam: needs Last A1c:  Lab Results  Component Value Date   HGBA1C 5.2 05/17/2020   Nephropathy screen indicated?: UTD Last flu, zoster and/or pneumovax:  Immunization History  Administered Date(s) Administered   Hepatitis A 02/18/2002, 04/22/2002, 11/06/2005   Hepatitis B 02/18/2002, 04/22/2002, 11/06/2005   Influenza Split 12/19/2011   Influenza,inj,Quad PF,6+ Mos 02/02/2015, 12/17/2015, 12/09/2018, 12/22/2019   Pneumococcal Conjugate-13 10/17/2017   Pneumococcal Polysaccharide-23 11/06/2005, 06/11/2009   Rabies, IM 04/19/2014, 04/22/2014, 05/09/2014   Td 07/18/1988   Tdap 11/06/2005    ROS: No CP, sob, dizziness.  2. Rhinosinusitis Reports allergy symptoms despite use of Zyrtec.  Uses flonase intermittently.  No humidification at night but sometimes has a cough that wakes her from sleep.  No hemoptysis, night sweats or unplanned weight loss.  No chest pain or fevers.  3.  Osteoarthritis Reports osteoarthritis of bilateral knees.  She uses muscle relaxer but this does not help.  Not currently using any oral NSAIDs or Tylenol.  No rubs.  4. Vaginal discharge/ odor Reports several day history of vaginal discharge and odor.  No bleeding reported.  Is sexually active.  Concerned about possible STI.   Substance use: Tobacco and Marijuana Diet: low carb, Exercise: no structured but walks a lot Last eye exam: needs Last dental exam: needs. Has a tooth that needs to be pulled Last colonoscopy: UTD Last mammogram: UTD Last pap smear: needs Refills needed today: unsure Immunizations needed: Immunization History  Administered Date(s) Administered    Hepatitis A 02/18/2002, 04/22/2002, 11/06/2005   Hepatitis B 02/18/2002, 04/22/2002, 11/06/2005   Influenza Split 12/19/2011   Influenza,inj,Quad PF,6+ Mos 02/02/2015, 12/17/2015, 12/09/2018, 12/22/2019   Pneumococcal Conjugate-13 10/17/2017   Pneumococcal Polysaccharide-23 11/06/2005, 06/11/2009   Rabies, IM 04/19/2014, 04/22/2014, 05/09/2014   Td 07/18/1988   Tdap 11/06/2005     Past Medical History:  Diagnosis Date   Ankle fracture    Anxiety    Asthma    Collagen vascular disease ([place])    COPD (chronic obstructive pulmonary disease) ([place])    Depression    Diabetes mellitus    Elevated LFTs 01/13/2012   GERD (gastroesophageal reflux disease)    Hepatitis C    Hernia    HIV (human immunodeficiency virus infection) ([place])    HIV (human immunodeficiency virus infection) ([place])    Hypertension    Hypothyroidism    Pinched nerve    Scoliosis    TB (tuberculosis)    Social History   Socioeconomic History   Marital status: Single    Spouse name: Not on file   Number of children: 2   Years of education: Not on file   Highest education level: Not on file  Occupational History   Occupation: Disability  Tobacco Use   Smoking status: Every Day    Packs/day: 1.00    Years: 35.00    Pack years: 35.00    Types: Cigarettes   Smokeless tobacco: Never   Tobacco comments:    1 pack every 2-3 days  Vaping Use   Vaping Use: Never used  Substance and Sexual Activity   Alcohol use: Not Currently    Alcohol/week: 3.0 standard  drinks    Types: 3 Cans of beer per week   Drug use: Not Currently    Types: Marijuana, Cocaine    Comment: last cocaine was 07/15/2017.   Sexual activity: Not Currently    Birth control/protection: Condom, None    Comment: condoms offered, condoms and lube provided  Other Topics Concern   Not on file  Social History Narrative   Pt lives in an apartment by herself.  Pt stated that she is married and that her husband is in a Corporate treasure[employer], and  that husband is being released yesterday or today.  Asked several times who Pt's psychiatric provider is.   Social Determinants of Health   Financial Resource Strain: Not on file  Food Insecurity: Not on file  Transportation Needs: Not on file  Physical Activity: Not on file  Stress: Not on file  Social Connections: Not on file  Intimate Partner Violence: Not on file   Past Surgical History:  Procedure Laterality Date   BIOPSY  05/08/2018   Procedure: biopsy;  Surgeon: Rogene Houston, MD;  [facility];  Service: Endoscopy;;  proximal transverse colon polyp   BIOPSY  10/22/2019   Procedure: BIOPSY;  Surgeon: Rogene Houston, MD;  [facility];  Service: Endoscopy;;   CESAREAN SECTION     CHOLECYSTECTOMY     COLONOSCOPY N/A 05/08/2018   Procedure: COLONOSCOPY;  Surgeon: Rogene Houston, MD;  [facility];  Service: Endoscopy;  Laterality: N/A;  2:00   ESOPHAGEAL DILATION  10/22/2019   Procedure: ESOPHAGEAL DILATION;  Surgeon: Rogene Houston, MD;  [facility];  Service: Endoscopy;;   ESOPHAGOGASTRODUODENOSCOPY (EGD) WITH PROPOFOL N/A 10/22/2019   Procedure: ESOPHAGOGASTRODUODENOSCOPY (EGD) WITH PROPOFOL;  Surgeon: Rogene Houston, MD;  [facility];  Service: Endoscopy;  Laterality: N/A;  [place]     x2   INCISIONAL HERNIA REPAIR N/A 09/30/2018   Procedure: OPEN HERNIA REPAIR INCISIONAL WITH MESH;  Surgeon: Virl Cagey, MD;  Location: [facility];  Service: General;  Laterality: N/A;   LIVER BIOPSY     Family History  Problem Relation Age of Onset   Diabetes Mother    Hypertension Mother    Stroke Mother        died 09-25-2013  Cancer Sister    Cancer Maternal Aunt     Current Outpatient Medications:    BIKTARVY 50-200-25 MG TABS tablet, TAKE ONE TABLET BY MOUTH DAILY, Disp: 30 tablet, Rfl: 5   cetirizine (ZYRTEC) 10 MG tablet, TAKE ONE TABLET BY MOUTH DAILY, Disp: 90 tablet, Rfl: 3   FLUoxetine (PROZAC)  20 MG capsule, TAKE ONE CAPSULE BY MOUTH DAILY, Disp: 90 capsule, Rfl: 3   fluticasone (FLONASE) 50 MCG/ACT nasal spray, PLACE 2 SPRAYS INTO BOTH NOSTRILS DAILY, Disp: 48 g, Rfl: 3   hydrocortisone cream 0.5 %, Apply 1 application topically 2 (two) times daily., Disp: 30 g, Rfl: 0   loratadine (CLARITIN) 10 MG tablet, Take 1 tablet (10 mg total) by mouth daily., Disp: 30 tablet, Rfl: 1   Miconazole Nitrate Applicator (MONISTAT 3 COMBO PACK APP) 200 & 2 MG-% (9GM) KIT, UAD, Disp: 24 g, Rfl: 0   Multiple Vitamin (TAB-A-VITE) TABS, TAKE ONE TABLET BY MOUTH DAILY, Disp: 90 tablet, Rfl: 1   pantoprazole (PROTONIX) 40 MG tablet, TAKE ONE TABLET BY MOUTH TWICE DAILY BEFORE MEALS, Disp: 60 tablet, Rfl: 0   PROAIR HFA 108 (90 Base)  MCG/ACT inhaler, INHALE TWO PUFFS INTO THE LUNGS EVERY 6 HOURS AS NEEDED FOR WHEEZING OR SHORTNESS OF BREATH, Disp: 8.5 g, Rfl: 2   QUEtiapine (SEROQUEL) 300 MG tablet, Take 1 tablet (300 mg total) by mouth at bedtime., Disp: 30 tablet, Rfl: 1   tiZANidine (ZANAFLEX) 4 MG tablet, TAKE 1 TABLET(4 MG) BY MOUTH EVERY 8 HOURS AS NEEDED FOR MUSCLE SPASMS, Disp: 30 tablet, Rfl: 1  Allergies  Allergen Reactions   Aspirin Other (See Comments)    Liver problems   Roxicodone [Oxycodone Hcl] Itching   Tomato      ROS: Review of Systems Pertinent items noted in HPI and remainder of comprehensive ROS otherwise negative.    Physical exam BP (!) 150/94   Pulse 78   Temp 98.1 F (36.7 C) (Temporal)   Ht 4' 11"  (1.499 m)   Wt 127 lb (57.6 kg)   SpO2 95%   BMI 25.65 kg/m  General appearance: alert, cooperative, appears stated age, and no distress Head: Normocephalic, without obvious abnormality, atraumatic Eyes: negative findings: lids and lashes normal, conjunctivae and sclerae normal, corneas clear, and pupils equal, round, reactive to light and accomodation Ears: normal TM's and external ear canals both ears Nose: Nares normal. Septum midline. Mucosa normal. No drainage or  sinus tenderness. Throat:  For dentition.  Multiple missing teeth.  Oropharynx without masses or erythema Neck: no adenopathy, supple, symmetrical, trachea midline, and thyroid not enlarged, symmetric, no tenderness/mass/nodules Back: symmetric, no curvature. ROM normal. No CVA tenderness. Lungs: clear to auscultation bilaterally Heart: regular rate and rhythm, S1, S2 normal, no murmur, click, rub or gallop Abdomen: soft, non-tender; bowel sounds normal; no masses,  no organomegaly Pelvic: external genitalia normal, no adnexal masses or tenderness, no cervical motion tenderness, rectovaginal septum normal, uterus normal size, shape, and consistency, vagina normal without discharge, and cervix with strawberry appearance Extremities: extremities normal, atraumatic, no cyanosis or edema Pulses: 2+ and symmetric Skin: Skin color, texture, turgor normal. No rashes or lesions Lymph nodes: Cervical, supraclavicular, and axillary nodes normal. Neurologic: Grossly normal Psych: Pleasant, interactive.   Assessment/ Plan: Ashley [name] here for annual physical exam.   Well woman exam with routine gynecological exam  Screening for cervical cancer - Plan: Cytology - PAP  Vaginal discharge - Plan: WET PREP FOR [place], YEAST, CLUE  Diet-controlled diabetes mellitus ([place]) - Plan: Bayer DCA Hb A1c Waived  Hypertension associated with diabetes ([place])  Human immunodeficiency virus (HIV) disease ([place]) - Plan: Cytology - PAP  Chronic hepatitis C without hepatic coma ([place]) - Plan: Cytology - PAP  Rhinosinusitis - Plan: mometasone (NASONEX) 50 MCG/ACT nasal spray, loratadine (CLARITIN) 10 MG tablet, DISCONTINUED: loratadine (CLARITIN) 10 MG tablet  Primary osteoarthritis of both knees - Plan: diclofenac Sodium (VOLTAREN) 1 % GEL  Pap smear performed and cotesting has been ordered.  Wet prep obtained for vaginal discharge.  Sugars and blood pressure well controlled with diet alone.  Reinforced need for  smoking cessation  I reviewed her most recent notes with infectious disease and everything looks to be under good control at this time.  Switch to Nasonex.  Start Claritin.  Discontinue Zyrtec.  No evidence of infection on exam.  Voltaren gel ordered for as needed use on her knees    Ashley Rohrer M. Lajuana Ripple, DO

## 2021-01-04 NOTE — Patient Instructions (Signed)
You had labs performed today.  You will be contacted with the results of the labs once they are available, usually in the next 3 business days for routine lab work.  If you have an active my chart account, they will be released to your MyChart.  If you prefer to have these labs released to you via telephone, please let us know.  If you had a pap smear or biopsy performed, expect to be contacted in about 7-10 days.  Preventive Care 57-57 Years Old, Female Preventive care refers to lifestyle choices and visits with your health care provider that can promote health and wellness. Preventive care visits are also called wellness exams. What can I expect for my preventive care visit? Counseling Your health care provider may ask you questions about your: Medical history, including: Past medical problems. Family medical history. Pregnancy history. Current health, including: Menstrual cycle. Method of birth control. Emotional well-being. Home life and relationship well-being. Sexual activity and sexual health. Lifestyle, including: Alcohol, nicotine or tobacco, and drug use. Access to firearms. Diet, exercise, and sleep habits. Work and work environment. Sunscreen use. Safety issues such as seatbelt and bike helmet use. Physical exam Your health care provider will check your: Height and weight. These may be used to calculate your BMI (body mass index). BMI is a measurement that tells if you are at a healthy weight. Waist circumference. This measures the distance around your waistline. This measurement also tells if you are at a healthy weight and may help predict your risk of certain diseases, such as type 2 diabetes and high blood pressure. Heart rate and blood pressure. Body temperature. Skin for abnormal spots. What immunizations do I need? Vaccines are usually given at various ages, according to a schedule. Your health care provider will recommend vaccines for you based on your age,  medical history, and lifestyle or other factors, such as travel or where you work. What tests do I need? Screening Your health care provider may recommend screening tests for certain conditions. This may include: Lipid and cholesterol levels. Diabetes screening. This is done by checking your blood sugar (glucose) after you have not eaten for a while (fasting). Pelvic exam and Pap test. Hepatitis B test. Hepatitis C test. HIV (human immunodeficiency virus) test. STI (sexually transmitted infection) testing, if you are at risk. Lung cancer screening. Colorectal cancer screening. Mammogram. Talk with your health care provider about when you should start having regular mammograms. This may depend on whether you have a family history of breast cancer. BRCA-related cancer screening. This may be done if you have a family history of breast, ovarian, tubal, or peritoneal cancers. Bone density scan. This is done to screen for osteoporosis. Talk with your health care provider about your test results, treatment options, and if necessary, the need for more tests. Follow these instructions at home: Eating and drinking  Eat a diet that includes fresh fruits and vegetables, whole grains, lean protein, and low-fat dairy products. Take vitamin and mineral supplements as recommended by your health care provider. Do not drink alcohol if: Your health care provider tells you not to drink. You are pregnant, may be pregnant, or are planning to become pregnant. If you drink alcohol: Limit how much you have to 0-1 drink a day. Know how much alcohol is in your drink. In the [place] one drink equals one 12 oz bottle of beer (355 mL), one 5 oz glass of wine (148 mL), or one 1 oz glass of hard liquor (44   mL). Lifestyle Brush your teeth every morning and night with fluoride toothpaste. Floss one time each day. Exercise for at least 30 minutes 5 or more days each week. Do not use any products that contain nicotine or  tobacco. These products include cigarettes, chewing tobacco, and vaping devices, such as e-cigarettes. If you need help quitting, ask your health care provider. Do not use drugs. If you are sexually active, practice safe sex. Use a condom or other form of protection to prevent STIs. If you do not wish to become pregnant, use a form of birth control. If you plan to become pregnant, see your health care provider for a prepregnancy visit. Take aspirin only as told by your health care provider. Make sure that you understand how much to take and what form to take. Work with your health care provider to find out whether it is safe and beneficial for you to take aspirin daily. Find healthy ways to manage stress, such as: Meditation, yoga, or listening to music. Journaling. Talking to a trusted person. Spending time with friends and family. Minimize exposure to UV radiation to reduce your risk of skin cancer. Safety Always wear your seat belt while driving or riding in a vehicle. Do not drive: If you have been drinking alcohol. Do not ride with someone who has been drinking. When you are tired or distracted. While texting. If you have been using any mind-altering substances or drugs. Wear a helmet and other protective equipment during sports activities. If you have firearms in your house, make sure you follow all gun safety procedures. Seek help if you have been physically or sexually abused. What's next? Visit your health care provider once a year for an annual wellness visit. Ask your health care provider how often you should have your eyes and teeth checked. Stay up to date on all vaccines. This information is not intended to replace advice given to you by your health care provider. Make sure you discuss any questions you have with your health care provider. Document Revised: 08/11/2020 Document Reviewed: 08/11/2020 Elsevier Patient Education  2022 Elsevier Inc.  

## 2021-01-06 ENCOUNTER — Other Ambulatory Visit: Payer: Self-pay | Admitting: Family Medicine

## 2021-01-06 LAB — CYTOLOGY - PAP
Chlamydia: NEGATIVE
Comment: NEGATIVE
Comment: NEGATIVE
Comment: NEGATIVE
Comment: NORMAL
Diagnosis: NEGATIVE
High risk HPV: NEGATIVE
Neisseria Gonorrhea: NEGATIVE
Trichomonas: NEGATIVE

## 2021-01-12 ENCOUNTER — Telehealth: Payer: Self-pay | Admitting: Family Medicine

## 2021-01-12 NOTE — Telephone Encounter (Signed)
I will not be writing a letter stating that she can smoke as this is not a habit that is promoting health.  Additionally, she told me she walks frequently at our last visit.  I was not aware that she has recently become incapacitated.  Does she need an appointment to address?

## 2021-01-12 NOTE — Telephone Encounter (Signed)
Patient aware and verbalizes understanding.  States she did not walk to her last visit that she rode in the car and sometimes it is hard to walk.  Aware letter will not be given.

## 2021-01-22 ENCOUNTER — Other Ambulatory Visit: Payer: Self-pay | Admitting: Family Medicine

## 2021-01-22 DIAGNOSIS — J439 Emphysema, unspecified: Secondary | ICD-10-CM

## 2021-02-01 ENCOUNTER — Other Ambulatory Visit: Payer: Self-pay | Admitting: Family Medicine

## 2021-02-02 ENCOUNTER — Other Ambulatory Visit: Payer: Self-pay | Admitting: Family Medicine

## 2021-02-04 ENCOUNTER — Ambulatory Visit (INDEPENDENT_AMBULATORY_CARE_PROVIDER_SITE_OTHER): Payer: Medicaid Other | Admitting: Nurse Practitioner

## 2021-02-04 DIAGNOSIS — H938X1 Other specified disorders of right ear: Secondary | ICD-10-CM | POA: Diagnosis not present

## 2021-02-04 NOTE — Progress Notes (Addendum)
   Virtual Visit  Note Due to COVID-19 pandemic this visit was conducted virtually. This visit type was conducted due to national recommendations for restrictions regarding the COVID-19 Pandemic (e.g. social distancing, sheltering in place) in an effort to limit this patient's exposure and mitigate transmission in our community. All issues noted in this document were discussed and addressed.  A physical exam was not performed with this format.  I connected with Ashley [name] on 02/04/21 at 3:13 by telephone and verified that I am speaking with the home correct person using two identifiers. Ashley [name] is currently located at home and no one is currently with her during visit. The provider, Mary-Margaret Hassell Done, FNP is located in their office at time of visit.  I discussed the limitations, risks, security and privacy concerns of performing an evaluation and management service by telephone and the availability of in person appointments. I also discussed with the patient that there may be a patient responsible charge related to this service. The patient expressed understanding and agreed to proceed.   History and Present Illness:  Patient states that she has an echo in her ear when she is talking. No pain or drainage. She has bene using flonase nasal spray since first part of the week. Has been taking nyquil tablets     Review of Systems  Constitutional:  Negative for chills, fever and malaise/fatigue.  HENT:  Negative for congestion, ear discharge and ear pain.   Respiratory:  Negative for cough.   Gastrointestinal:  Positive for blood in stool.  Musculoskeletal:  Negative for myalgias.  Neurological:  Negative for dizziness and headaches.  All other systems reviewed and are negative.   Observations/Objective: Alert and oriented- answers all questions appropriately No distress   Assessment and Plan: Ashley [name] in today with chief complaint of No chief complaint on file.   1.  Congestion of right ear Continue flonase as prescribed OTC decongestant debroxOTC for ear wax If no better by Monday will need to be seen in person.    Follow Up Instructions: prn    I discussed the assessment and treatment plan with the patient. The patient was provided an opportunity to ask questions and all were answered. The patient agreed with the plan and demonstrated an understanding of the instructions.   The patient was advised to call back or seek an in-person evaluation if the symptoms worsen or if the condition fails to improve as anticipated.  The above assessment and management plan was discussed with the patient. The patient verbalized understanding of and has agreed to the management plan. Patient is aware to call the clinic if symptoms persist or worsen. Patient is aware when to return to the clinic for a follow-up visit. Patient educated on when it is appropriate to go to the emergency department.   Time call ended:  3:25  I provided 12 minutes of  non face-to-face time during this encounter.    Mary-Margaret Hassell Done, FNP

## 2021-02-08 ENCOUNTER — Other Ambulatory Visit: Payer: Self-pay | Admitting: Family Medicine

## 2021-02-08 DIAGNOSIS — H60502 Unspecified acute noninfective otitis externa, left ear: Secondary | ICD-10-CM

## 2021-02-11 ENCOUNTER — Telehealth: Payer: Self-pay | Admitting: *Deleted

## 2021-02-11 MED ORDER — OFLOXACIN 0.3 % OT SOLN: 5.0000 [drp] | Freq: Every day | OTIC | 0 refills | Status: DC

## 2021-02-11 NOTE — Telephone Encounter (Signed)
Fax from [facility] 4 gtts l ear BID x 7d Ins requires brand name, it is unavailable Is there an alternative product you can prescribe Please advise

## 2021-02-11 NOTE — Addendum Note (Signed)
Addended by: Chevis Pretty on: 02/11/2021 12:05 PM   Modules accepted: Orders

## 2021-03-03 ENCOUNTER — Other Ambulatory Visit: Payer: Self-pay | Admitting: Family Medicine

## 2021-03-03 ENCOUNTER — Telehealth: Payer: Self-pay | Admitting: Family Medicine

## 2021-03-03 DIAGNOSIS — J310 Chronic rhinitis: Secondary | ICD-10-CM

## 2021-03-03 DIAGNOSIS — J329 Chronic sinusitis, unspecified: Secondary | ICD-10-CM

## 2021-03-03 NOTE — Telephone Encounter (Signed)
Pt aware she has refills she needs to call pharmacy

## 2021-03-03 NOTE — Telephone Encounter (Signed)
°  Prescription Request  03/03/2021  Is this a "Controlled Substance" medicine? no  Have you seen your PCP in the last 2 weeks? 02/04/21  If YES, route message to pool  -  If NO, patient needs to be scheduled for appointment.  What is the name of the medication or equipment? claritin  Have you contacted your pharmacy to request a refill? yes   Which pharmacy would you like this sent to? [facility]   Patient notified that their request is being sent to the clinical staff for review and that they should receive a response within 2 business days.

## 2021-03-15 ENCOUNTER — Other Ambulatory Visit: Payer: Self-pay | Admitting: Family Medicine

## 2021-03-25 ENCOUNTER — Other Ambulatory Visit: Payer: Self-pay | Admitting: Internal Medicine

## 2021-03-25 DIAGNOSIS — B20 Human immunodeficiency virus [HIV] disease: Secondary | ICD-10-CM

## 2021-03-30 ENCOUNTER — Other Ambulatory Visit: Payer: Self-pay

## 2021-03-30 DIAGNOSIS — Z113 Encounter for screening for infections with a predominantly sexual mode of transmission: Secondary | ICD-10-CM

## 2021-03-30 DIAGNOSIS — B20 Human immunodeficiency virus [HIV] disease: Secondary | ICD-10-CM

## 2021-03-31 ENCOUNTER — Other Ambulatory Visit: Payer: Self-pay

## 2021-03-31 ENCOUNTER — Other Ambulatory Visit: Payer: Medicaid Other

## 2021-03-31 DIAGNOSIS — B20 Human immunodeficiency virus [HIV] disease: Secondary | ICD-10-CM

## 2021-03-31 DIAGNOSIS — Z113 Encounter for screening for infections with a predominantly sexual mode of transmission: Secondary | ICD-10-CM

## 2021-04-04 LAB — COMPLETE METABOLIC PANEL WITH GFR
AG Ratio: 1.7 (calc) (ref 1.0–2.5)
ALT: 19 U/L (ref 6–29)
AST: 39 U/L — ABNORMAL HIGH (ref 10–35)
Albumin: 4.2 g/dL (ref 3.6–5.1)
Alkaline phosphatase (APISO): 56 U/L (ref 37–153)
BUN: 10 mg/dL (ref 7–25)
CO2: 29 mmol/L (ref 20–32)
Calcium: 9.1 mg/dL (ref 8.6–10.4)
Chloride: 104 mmol/L (ref 98–110)
Creat: 0.97 mg/dL (ref 0.50–1.03)
Globulin: 2.5 g/dL (calc) (ref 1.9–3.7)
Glucose, Bld: 90 mg/dL (ref 65–99)
Potassium: 4 mmol/L (ref 3.5–5.3)
Sodium: 141 mmol/L (ref 135–146)
Total Bilirubin: 0.5 mg/dL (ref 0.2–1.2)
Total Protein: 6.7 g/dL (ref 6.1–8.1)
eGFR: 68 mL/min/{1.73_m2} (ref >=60)

## 2021-04-04 LAB — CBC WITH DIFFERENTIAL/PLATELET
Absolute Monocytes: 407 cells/uL (ref 200–950)
Basophils Absolute: 49 cells/uL (ref 0–200)
Basophils Relative: 1 %
Eosinophils Absolute: 88 cells/uL (ref 15–500)
Eosinophils Relative: 1.8 %
HCT: 35 % (ref 35.0–45.0)
Hemoglobin: 11.5 g/dL — ABNORMAL LOW (ref 11.7–15.5)
Lymphs Abs: 2568 cells/uL (ref 850–3900)
MCH: 33.5 pg — ABNORMAL HIGH (ref 27.0–33.0)
MCHC: 32.9 g/dL (ref 32.0–36.0)
MCV: 102 fL — ABNORMAL HIGH (ref 80.0–100.0)
MPV: 10.7 fL (ref 7.5–12.5)
Monocytes Relative: 8.3 %
Neutro Abs: 1789 cells/uL (ref 1500–7800)
Neutrophils Relative %: 36.5 %
Platelets: 175 10*3/uL (ref 140–400)
RBC: 3.43 10*6/uL — ABNORMAL LOW (ref 3.80–5.10)
RDW: 12.7 % (ref 11.0–15.0)
Total Lymphocyte: 52.4 %
WBC: 4.9 10*3/uL (ref 3.8–10.8)

## 2021-04-04 LAB — T-HELPER CELLS (CD4) COUNT (NOT AT ARMC)
Absolute CD4: 995 cells/uL (ref 490–1740)
CD4 T Helper %: 38 % (ref 30–61)
Total lymphocyte count: 2612 cells/uL (ref 850–3900)

## 2021-04-04 LAB — RPR: RPR Ser Ql: NONREACTIVE

## 2021-04-04 LAB — HIV-1 RNA QUANT-NO REFLEX-BLD
HIV 1 RNA Quant: 22 Copies/mL — ABNORMAL HIGH
HIV-1 RNA Quant, Log: 1.34 Log cps/mL — ABNORMAL HIGH

## 2021-04-06 ENCOUNTER — Other Ambulatory Visit: Payer: Self-pay | Admitting: Family Medicine

## 2021-04-06 DIAGNOSIS — J439 Emphysema, unspecified: Secondary | ICD-10-CM

## 2021-04-09 ENCOUNTER — Other Ambulatory Visit: Payer: Self-pay | Admitting: Family Medicine

## 2021-04-09 DIAGNOSIS — J069 Acute upper respiratory infection, unspecified: Secondary | ICD-10-CM

## 2021-04-10 ENCOUNTER — Other Ambulatory Visit: Payer: Self-pay | Admitting: Nurse Practitioner

## 2021-04-14 ENCOUNTER — Ambulatory Visit (INDEPENDENT_AMBULATORY_CARE_PROVIDER_SITE_OTHER): Payer: Medicaid Other | Admitting: Internal Medicine

## 2021-04-14 ENCOUNTER — Encounter: Payer: Self-pay | Admitting: Internal Medicine

## 2021-04-14 ENCOUNTER — Other Ambulatory Visit: Payer: Self-pay

## 2021-04-14 VITALS — BP 177/116 | HR 74 | Temp 98.4°F | Resp 16 | Ht <= 58 in | Wt 126.0 lb

## 2021-04-14 DIAGNOSIS — H938X2 Other specified disorders of left ear: Secondary | ICD-10-CM | POA: Diagnosis not present

## 2021-04-14 DIAGNOSIS — B20 Human immunodeficiency virus [HIV] disease: Secondary | ICD-10-CM | POA: Diagnosis not present

## 2021-04-14 DIAGNOSIS — Z23 Encounter for immunization: Secondary | ICD-10-CM | POA: Diagnosis not present

## 2021-04-14 DIAGNOSIS — Z720 Tobacco use: Secondary | ICD-10-CM

## 2021-04-14 MED ORDER — BIKTARVY 50-200-25 MG PO TABS: 1.0000 | ORAL_TABLET | Freq: Every day | ORAL | 11 refills | Status: DC

## 2021-04-14 NOTE — Assessment & Plan Note (Signed)
Counseled on quitting

## 2021-04-14 NOTE — Progress Notes (Signed)
° °  Subjective:    Patient ID: Ashley [name], female    DOB: Sep 16, 1965, 56 y.o.   MRN: [id number]  HPI Here for follow up of HIV She has remained back in care and doing well on Biktarvy.  Remains drug free, now 4 years.  CD4 of 995 and viral load just 22.  No new concerns.     Review of Systems  Constitutional:  Negative for fatigue.  Gastrointestinal:  Negative for diarrhea and nausea.  Skin:  Negative for rash.      Objective:   Physical Exam HENT:     Head:     Comments: Some fluid behind TMs bilateral    Right Ear: Ear canal and external ear normal.     Left Ear: Ear canal and external ear normal.  Eyes:     General: No scleral icterus. Pulmonary:     Effort: Pulmonary effort is normal.  Skin:    Findings: No rash.  Neurological:     General: No focal deficit present.     Mental Status: She is alert.  Psychiatric:        Mood and Affect: Mood normal.    SH: + tobacco      Assessment & Plan:

## 2021-04-14 NOTE — Assessment & Plan Note (Signed)
Some congestion noted and ear with fluid.  Recommended allergy medication otc

## 2021-04-14 NOTE — Addendum Note (Signed)
Addended by: Tomi Bamberger on: 04/14/2021 02:48 PM   Modules accepted: Orders

## 2021-04-14 NOTE — Assessment & Plan Note (Signed)
She is doing well on Biktarvy and no inidication for change.   Refills sent rtc in 6 months

## 2021-04-14 NOTE — Assessment & Plan Note (Signed)
Discussed Prevnar 20 and given today.

## 2021-04-15 ENCOUNTER — Other Ambulatory Visit: Payer: Self-pay | Admitting: Nurse Practitioner

## 2021-04-15 DIAGNOSIS — H60502 Unspecified acute noninfective otitis externa, left ear: Secondary | ICD-10-CM

## 2021-04-25 ENCOUNTER — Other Ambulatory Visit: Payer: Self-pay | Admitting: Family Medicine

## 2021-04-27 ENCOUNTER — Other Ambulatory Visit (HOSPITAL_COMMUNITY): Payer: Self-pay

## 2021-05-04 ENCOUNTER — Telehealth: Payer: Self-pay | Admitting: Family Medicine

## 2021-05-04 ENCOUNTER — Other Ambulatory Visit: Payer: Self-pay | Admitting: Family Medicine

## 2021-05-04 DIAGNOSIS — J329 Chronic sinusitis, unspecified: Secondary | ICD-10-CM

## 2021-05-04 NOTE — Telephone Encounter (Signed)
Pt called stating that she needs [place] to void her Claritin Rx that was sent to [facility] in [place] and send to her regular pharmacy which is [facility] [place]. ?

## 2021-05-05 MED ORDER — LORATADINE 10 MG PO TABS: 10.0000 mg | ORAL_TABLET | Freq: Every day | ORAL | 2 refills | Status: DC

## 2021-05-05 NOTE — Telephone Encounter (Signed)
Pt aware sent to [facility] ?

## 2021-05-18 ENCOUNTER — Other Ambulatory Visit (INDEPENDENT_AMBULATORY_CARE_PROVIDER_SITE_OTHER): Payer: Self-pay | Admitting: Internal Medicine

## 2021-05-18 DIAGNOSIS — K219 Gastro-esophageal reflux disease without esophagitis: Secondary | ICD-10-CM

## 2021-06-20 ENCOUNTER — Telehealth: Payer: Self-pay | Admitting: Family Medicine

## 2021-06-20 NOTE — Telephone Encounter (Signed)
Patient needs to get a shower chair and pads.  Social worker is trying to help her with these items. Provider states she needs to be seen before we can do this.  Patient has upcoming appt May 9th, but needs to get these items sooner.  Dr. Darnell Level has no openings.  Could she see another provider to get this done? ?

## 2021-06-20 NOTE — Telephone Encounter (Signed)
Front is calling pt for appt  ?

## 2021-06-21 ENCOUNTER — Emergency Department (HOSPITAL_COMMUNITY): Payer: Medicaid Other

## 2021-06-21 ENCOUNTER — Emergency Department (HOSPITAL_COMMUNITY)
Admission: EM | Admit: 2021-06-21 | Discharge: 2021-06-21 | Disposition: A | Discharge status: Home / Self Care | Payer: Medicaid Other | Attending: Emergency Medicine | Admitting: Emergency Medicine

## 2021-06-21 ENCOUNTER — Ambulatory Visit: Payer: Medicaid Other | Admitting: Nurse Practitioner

## 2021-06-21 ENCOUNTER — Other Ambulatory Visit: Payer: Self-pay

## 2021-06-21 ENCOUNTER — Other Ambulatory Visit: Payer: Self-pay | Admitting: Family Medicine

## 2021-06-21 ENCOUNTER — Encounter (HOSPITAL_COMMUNITY): Payer: Self-pay

## 2021-06-21 DIAGNOSIS — K2900 Acute gastritis without bleeding: Secondary | ICD-10-CM | POA: Insufficient documentation

## 2021-06-21 DIAGNOSIS — R55 Syncope and collapse: Secondary | ICD-10-CM | POA: Diagnosis not present

## 2021-06-21 DIAGNOSIS — Y92480 Sidewalk as the place of occurrence of the external cause: Secondary | ICD-10-CM | POA: Diagnosis not present

## 2021-06-21 DIAGNOSIS — R300 Dysuria: Secondary | ICD-10-CM | POA: Diagnosis not present

## 2021-06-21 DIAGNOSIS — J449 Chronic obstructive pulmonary disease, unspecified: Secondary | ICD-10-CM | POA: Diagnosis not present

## 2021-06-21 DIAGNOSIS — R42 Dizziness and giddiness: Secondary | ICD-10-CM | POA: Insufficient documentation

## 2021-06-21 DIAGNOSIS — R1110 Vomiting, unspecified: Secondary | ICD-10-CM | POA: Diagnosis present

## 2021-06-21 DIAGNOSIS — W01198A Fall on same level from slipping, tripping and stumbling with subsequent striking against other object, initial encounter: Secondary | ICD-10-CM | POA: Diagnosis not present

## 2021-06-21 LAB — CBC
HCT: 37.8 % (ref 36.0–46.0)
Hemoglobin: 13.2 g/dL (ref 12.0–15.0)
MCH: 34 pg (ref 26.0–34.0)
MCHC: 34.9 g/dL (ref 30.0–36.0)
MCV: 97.4 fL (ref 80.0–100.0)
Platelets: 205 10*3/uL (ref 150–400)
RBC: 3.88 MIL/uL (ref 3.87–5.11)
RDW: 13.8 % (ref 11.5–15.5)
WBC: 6.5 10*3/uL (ref 4.0–10.5)
nRBC: 0 % (ref 0.0–0.2)

## 2021-06-21 LAB — COMPREHENSIVE METABOLIC PANEL
ALT: 28 U/L (ref 0–44)
AST: 57 U/L — ABNORMAL HIGH (ref 15–41)
Albumin: 4.9 g/dL (ref 3.5–5.0)
Alkaline Phosphatase: 58 U/L (ref 38–126)
Anion gap: 12 (ref 5–15)
BUN: 9 mg/dL (ref 6–20)
CO2: 24 mmol/L (ref 22–32)
Calcium: 7.9 mg/dL — ABNORMAL LOW (ref 8.9–10.3)
Chloride: 105 mmol/L (ref 98–111)
Creatinine, Ser: 1.06 mg/dL — ABNORMAL HIGH (ref 0.44–1.00)
GFR, Estimated: >60 mL/min (ref >60)
Glucose, Bld: 154 mg/dL — ABNORMAL HIGH (ref 70–99)
Potassium: 3.2 mmol/L — ABNORMAL LOW (ref 3.5–5.1)
Sodium: 141 mmol/L (ref 135–145)
Total Bilirubin: 1 mg/dL (ref 0.3–1.2)
Total Protein: 8 g/dL (ref 6.5–8.1)

## 2021-06-21 LAB — CBG MONITORING, ED: Glucose-Capillary: 128 mg/dL — ABNORMAL HIGH (ref 70–99)

## 2021-06-21 LAB — URINALYSIS, ROUTINE W REFLEX MICROSCOPIC
Bacteria, UA: NONE SEEN
Bilirubin Urine: NEGATIVE
Glucose, UA: NEGATIVE mg/dL
Hgb urine dipstick: NEGATIVE
Ketones, ur: 5 mg/dL — AB
Nitrite: NEGATIVE
Protein, ur: 100 mg/dL — AB
Specific Gravity, Urine: 1.044 — ABNORMAL HIGH (ref 1.005–1.030)
pH: 6 (ref 5.0–8.0)

## 2021-06-21 LAB — LIPASE, BLOOD: Lipase: 26 U/L (ref 11–51)

## 2021-06-21 LAB — POC URINE PREG, ED: Preg Test, Ur: NEGATIVE

## 2021-06-21 MED ORDER — SUCRALFATE 1 GM/10ML PO SUSP: 1.0000 g | Freq: Three times a day (TID) | ORAL | 0 refills | Status: DC

## 2021-06-21 MED ORDER — PANTOPRAZOLE SODIUM 40 MG IV SOLR
40.0000 mg | Freq: Once | INTRAVENOUS | Status: AC
  Administered 2021-06-21: 40 mg via INTRAVENOUS
  Filled 2021-06-21: qty 10

## 2021-06-21 MED ORDER — ONDANSETRON 4 MG PO TBDP: ORAL_TABLET | ORAL | 0 refills | Status: DC

## 2021-06-21 MED ORDER — SODIUM CHLORIDE 0.9 % IV BOLUS
1000.0000 mL | Freq: Once | INTRAVENOUS | Status: AC
  Administered 2021-06-21: 1000 mL via INTRAVENOUS

## 2021-06-21 MED ORDER — IOHEXOL 300 MG/ML  SOLN
100.0000 mL | Freq: Once | INTRAMUSCULAR | Status: AC | PRN
  Administered 2021-06-21: 80 mL via INTRAVENOUS

## 2021-06-21 NOTE — ED Triage Notes (Signed)
Per EMS, Pt, from home, c/o generalized abdominal pain and emesis x1 week and today had a fall r/t weakness and hit head on concrete sidewalk.  Pain score 6/10.   ? ?Pt reports she was taken off HTN medications x1 year ago.   ?

## 2021-06-21 NOTE — ED Provider Notes (Signed)
?[place] EMERGENCY DEPARTMENT ?Provider Note ? ? ?CSN: [id number] ?Arrival date & time: 06/21/21  0958 ? ?  ? ?History ? ?Chief Complaint  ?Patient presents with  ? Emesis  ? Fall  ? Near Syncope  ? ? ?Ashley [name] is a 56 y.o. female. ? ?Patient states she has been throwing up for a number days and felt dizzy and fell hit her head.  Patient has a history of COPD ? ?The history is provided by the patient and medical records.  ?Emesis ?Severity:  Moderate ?Timing:  Constant ?Quality:  Bilious material ?Able to tolerate:  Liquids ?Progression:  Unchanged ?Chronicity:  New ?Recent urination:  Normal ?Context: not post-tussive   ?Relieved by:  Nothing ?Worsened by:  Nothing ?Ineffective treatments:  None tried ?Associated symptoms: no abdominal pain, no cough, no diarrhea and no headaches   ?Fall ?Pertinent negatives include no chest pain, no abdominal pain and no headaches.  ?Near Syncope ?Pertinent negatives include no chest pain, no abdominal pain and no headaches.  ? ?  ? ?Home Medications ?Prior to Admission medications   ?Medication Sig Start Date End Date Taking? Authorizing Provider  ?ondansetron (ZOFRAN-ODT) 4 MG disintegrating tablet 4mg ODT q4 hours prn nausea/vomit 06/21/21  Yes , , MD  ?sucralfate (CARAFATE) 1 GM/10ML suspension Take 10 mLs (1 g total) by mouth 4 (four) times daily -  with meals and at bedtime. 06/21/21  Yes , , MD  ?bictegravir-emtricitabine-tenofovir AF (BIKTARVY) 50-200-25 MG TABS tablet Take 1 tablet by mouth daily. 04/14/21   Comer, Robert W, MD  ?CIPRODEX OTIC suspension SHAKE LIQUID AND INSTILL 4 DROPS TO LEFT EAR TWICE DAILY FOR 7 DAYS 02/08/21   Martin, Mary-Margaret, FNP  ?diclofenac Sodium (VOLTAREN) 1 % GEL Apply 4 g topically 4 (four) times daily as needed (knee pain/ arthritis). 01/04/21   Gottschalk, Ashly M, DO  ?FLUoxetine (PROZAC) 20 MG capsule TAKE ONE CAPSULE BY MOUTH DAILY 06/21/21   Gottschalk, Ashly M, DO  ?hydrocortisone cream 0.5 % Apply 1  application topically 2 (two) times daily. 10/13/20   Morgan, Tiffany M, FNP  ?loratadine (CLARITIN) 10 MG tablet Take 1 tablet (10 mg total) by mouth daily. 05/05/21   Gottschalk, Ashly M, DO  ?Miconazole Nitrate Applicator (MONISTAT 3 COMBO PACK APP) 200 & 2 MG-% (9GM) KIT UAD 06/15/20   Gottschalk, Ashly M, DO  ?mometasone (NASONEX) 50 MCG/ACT nasal spray Place 2 sprays into the nose daily. 01/04/21   Gottschalk, Ashly M, DO  ?Multiple Vitamin (TAB-A-VITE) TABS TAKE ONE TABLET BY MOUTH DAILY 06/21/21   Gottschalk, Ashly M, DO  ?ofloxacin (FLOXIN OTIC) 0.3 % OTIC solution Place 5 drops into both ears daily. 02/11/21   Martin, Mary-Margaret, FNP  ?pantoprazole (PROTONIX) 40 MG tablet TAKE ONE TABLET BY MOUTH TWICE DAILY BEFORE MEALS 10/11/20   Castaneda Mayorga, Daniel, MD  ?QUEtiapine (SEROQUEL) 300 MG tablet Take 1 tablet (300 mg total) by mouth at bedtime. 06/23/19   Clapacs, John T, MD  ?tiZANidine (ZANAFLEX) 4 MG tablet TAKE 1 TABLET(4 MG) BY MOUTH EVERY 8 HOURS AS NEEDED FOR MUSCLE SPASMS 03/15/21   Gottschalk, Ashly M, DO  ?VENTOLIN HFA 108 (90 Base) MCG/ACT inhaler INHALE 2 PUFFS INTO THE LUNGS EVERY 6 HOURS AS NEEDED FOR WHEEZING OR SHORTNESS OF BREATH 04/07/21   Gottschalk, Ashly M, DO  ?   ? ?Allergies    ?Aspirin, Roxicodone [oxycodone hcl], and Tomato   ? ?Review of Systems   ?Review of Systems  ?Constitutional:  Negative for appetite   change and fatigue.  ?HENT:  Negative for congestion, ear discharge and sinus pressure.   ?Eyes:  Negative for discharge.  ?Respiratory:  Negative for cough.   ?Cardiovascular:  Positive for near-syncope. Negative for chest pain.  ?Gastrointestinal:  Positive for vomiting. Negative for abdominal pain and diarrhea.  ?Genitourinary:  Negative for frequency and hematuria.  ?Musculoskeletal:  Negative for back pain.  ?Skin:  Negative for rash.  ?Neurological:  Positive for dizziness. Negative for seizures and headaches.  ?Psychiatric/Behavioral:  Negative for hallucinations.    ? ?Physical Exam ?Updated Vital Signs ?BP (!) 166/115   Pulse 88   Temp 97.6 ?F (36.4 ?C) (Oral)   Resp (!) 26   Ht 4' 7" (1.397 m)   Wt 57.2 kg   SpO2 99%   BMI 29.29 kg/m?  ?Physical Exam ?Vitals and nursing note reviewed.  ?Constitutional:   ?   Appearance: She is well-developed.  ?HENT:  ?   Head: Normocephalic.  ?   Nose: Nose normal.  ?Eyes:  ?   General: No scleral icterus. ?   Conjunctiva/sclera: Conjunctivae normal.  ?Neck:  ?   Thyroid: No thyromegaly.  ?Cardiovascular:  ?   Rate and Rhythm: Normal rate and regular rhythm.  ?   Heart sounds: No murmur heard. ?  No friction rub. No gallop.  ?Pulmonary:  ?   Breath sounds: No stridor. No wheezing or rales.  ?Chest:  ?   Chest wall: No tenderness.  ?Abdominal:  ?   General: There is no distension.  ?   Tenderness: There is no abdominal tenderness. There is no rebound.  ?Musculoskeletal:     ?   General: Normal range of motion.  ?   Cervical back: Neck supple.  ?Lymphadenopathy:  ?   Cervical: No cervical adenopathy.  ?Skin: ?   Findings: No erythema or rash.  ?Neurological:  ?   Mental Status: She is oriented to person, place, and time.  ?   Motor: No abnormal muscle tone.  ?   Coordination: Coordination normal.  ?Psychiatric:     ?   Behavior: Behavior normal.  ? ? ?ED Results / Procedures / Treatments   ?Labs ?(all labs ordered are listed, but only abnormal results are displayed) ?Labs Reviewed  ?URINALYSIS, ROUTINE W REFLEX MICROSCOPIC - Abnormal; Notable for the following components:  ?    Result Value  ? Specific Gravity, Urine 1.044 (*)   ? Ketones, ur 5 (*)   ? Protein, ur 100 (*)   ? Leukocytes,Ua SMALL (*)   ? Non Squamous Epithelial 0-5 (*)   ? All other components within normal limits  ?COMPREHENSIVE METABOLIC PANEL - Abnormal; Notable for the following components:  ? Potassium 3.2 (*)   ? Glucose, Bld 154 (*)   ? Creatinine, Ser 1.06 (*)   ? Calcium 7.9 (*)   ? AST 57 (*)   ? All other components within normal limits  ?CBG MONITORING, ED -  Abnormal; Notable for the following components:  ? Glucose-Capillary 128 (*)   ? All other components within normal limits  ?CBC  ?LIPASE, BLOOD  ?POC URINE PREG, ED  ? ? ?EKG ?None ? ?Radiology ?CT Head Wo Contrast ? ?Result Date: 06/21/2021 ?CLINICAL DATA:  Head trauma, abnormal mental status (Age 18-64y); Ataxia, cervical trauma EXAM: CT HEAD WITHOUT CONTRAST CT CERVICAL SPINE WITHOUT CONTRAST TECHNIQUE: Multidetector CT imaging of the head and cervical spine was performed following the standard protocol without intravenous contrast. Multiplanar CT image reconstructions of   the cervical spine were also generated. RADIATION DOSE REDUCTION: This exam was performed according to the departmental dose-optimization program which includes automated exposure control, adjustment of the mA and/or kV according to patient size and/or use of iterative reconstruction technique. COMPARISON:  CT cervical spine 10/01/2010 FINDINGS: CT HEAD FINDINGS Brain: No evidence of acute infarction, hemorrhage, hydrocephalus, extra-axial collection or mass lesion/mass effect. Vascular: No hyperdense vessel identified. Skull: No acute fracture. Sinuses/Orbits: Largely clear sinuses.  No acute orbital findings. Other: No mastoid effusions. CT CERVICAL SPINE FINDINGS Alignment: Straightening.  No substantial sagittal subluxation. Skull base and vertebrae: Vertebral body heights are maintained. No evidence of acute fracture. Soft tissues and spinal canal: No prevertebral fluid or swelling. No visible canal hematoma. Disc levels:  Mild to moderate multilevel degenerative change. Upper chest: Visualized lung apices are clear. IMPRESSION: 1. No evidence of acute intracranial abnormality. 2. No evidence of acute fracture or traumatic malalignment in the cervical spine. Electronically Signed   By: Frederick S Jones M.D.   On: 06/21/2021 12:09  ? ?CT Cervical Spine Wo Contrast ? ?Result Date: 06/21/2021 ?CLINICAL DATA:  Head trauma, abnormal mental  status (Age 18-64y); Ataxia, cervical trauma EXAM: CT HEAD WITHOUT CONTRAST CT CERVICAL SPINE WITHOUT CONTRAST TECHNIQUE: Multidetector CT imaging of the head and cervical spine was performed following the standard protocol

## 2021-06-21 NOTE — ED Notes (Signed)
Pt wheeled to lobby. Verbalized understanding discharge instructions, prescriptions and follow-up. In no acute distress.  ? ?

## 2021-06-21 NOTE — ED Notes (Signed)
Pt was notified about needing a urine specimen  ?

## 2021-06-21 NOTE — ED Notes (Signed)
Patient transported to CT 

## 2021-06-21 NOTE — ED Notes (Signed)
Pt and caregiver requesting an update.  EDP made aware.  ?

## 2021-06-21 NOTE — Discharge Instructions (Addendum)
Follow-up with Dr.Castaneda by next week.  Get seen sooner if problems ?

## 2021-06-21 NOTE — ED Notes (Signed)
When this writer walked into the room, Pt was found to have removed her c collar.  ?

## 2021-06-23 LAB — URINE CULTURE

## 2021-06-24 ENCOUNTER — Encounter: Payer: Self-pay | Admitting: Nurse Practitioner

## 2021-06-24 ENCOUNTER — Ambulatory Visit (INDEPENDENT_AMBULATORY_CARE_PROVIDER_SITE_OTHER): Payer: Medicaid Other | Admitting: Nurse Practitioner

## 2021-06-24 VITALS — BP 129/84 | HR 79 | Temp 99.4°F | Resp 20 | Ht <= 58 in | Wt 114.0 lb

## 2021-06-24 DIAGNOSIS — W19XXXA Unspecified fall, initial encounter: Secondary | ICD-10-CM | POA: Insufficient documentation

## 2021-06-24 DIAGNOSIS — W19XXXD Unspecified fall, subsequent encounter: Secondary | ICD-10-CM

## 2021-06-24 DIAGNOSIS — B20 Human immunodeficiency virus [HIV] disease: Secondary | ICD-10-CM

## 2021-06-24 DIAGNOSIS — R32 Unspecified urinary incontinence: Secondary | ICD-10-CM | POA: Diagnosis not present

## 2021-06-24 DIAGNOSIS — R634 Abnormal weight loss: Secondary | ICD-10-CM

## 2021-06-24 DIAGNOSIS — M170 Bilateral primary osteoarthritis of knee: Secondary | ICD-10-CM | POA: Diagnosis not present

## 2021-06-24 DIAGNOSIS — J439 Emphysema, unspecified: Secondary | ICD-10-CM

## 2021-06-24 NOTE — Progress Notes (Signed)
? ?Acute Office Visit ? ?Subjective:  ? ?  ?Patient ID: Ashley [name], female    DOB: 1965/04/13, 56 y.o.   MRN: [id number] ? ?Chief Complaint  ?Patient presents with  ? Dizziness  ?  Eyes are twitching since fall and ER visit   ? face to face   ?  For lift chair   ? ? ?Eye Pain  ?Both eyes are affected. This is a new problem. Episode onset: in the past 3-4 days. The problem has been unchanged. Injury mechanism: fall. The pain is moderate. There is No known exposure to pink eye. She Does not wear contacts. Associated symptoms include double vision. Pertinent negatives include no blurred vision. She has tried nothing for the symptoms.  ?Symptoms precipitated by a recent fall. ? ? ?Review of Systems  ?Constitutional: Negative.   ?HENT: Negative.    ?Eyes:  Positive for double vision and pain. Negative for blurred vision.  ?Respiratory: Negative.    ?Cardiovascular: Negative.   ?Gastrointestinal: Negative.   ?Genitourinary: Negative.   ?Musculoskeletal: Negative.   ?Skin: Negative.   ?Neurological: Negative.   ?All other systems reviewed and are negative. ? ? ?   ?Objective:  ?  ?BP 129/84   Pulse 79   Temp 99.4 ?F (37.4 ?C)   Resp 20   Ht '4\' 7"'$  (1.397 m)   Wt 114 lb (51.7 kg)   SpO2 97%   BMI 26.50 kg/m?  ?BP Readings from Last 3 Encounters:  ?06/24/21 129/84  ?06/21/21 (!) 166/115  ?04/14/21 (!) 177/116  ? ?Wt Readings from Last 3 Encounters:  ?06/24/21 114 lb (51.7 kg)  ?06/21/21 126 lb (57.2 kg)  ?04/14/21 126 lb (57.2 kg)  ? ?  ? ?Physical Exam ?Vitals and nursing note reviewed.  ?Constitutional:   ?   Appearance: Normal appearance.  ?HENT:  ?   Head: Normocephalic.  ?   Right Ear: External ear normal.  ?   Left Ear: External ear normal.  ?   Nose: Nose normal.  ?   Mouth/Throat:  ?   Mouth: Mucous membranes are moist.  ?   Pharynx: Oropharynx is clear.  ?Eyes:  ?   Conjunctiva/sclera: Conjunctivae normal.  ?Cardiovascular:  ?   Rate and Rhythm: Normal rate and regular rhythm.  ?   Pulses: Normal pulses.  ?    Heart sounds: Normal heart sounds.  ?Pulmonary:  ?   Effort: Pulmonary effort is normal.  ?   Breath sounds: Normal breath sounds.  ?Abdominal:  ?   General: Bowel sounds are normal.  ?Skin: ?   General: Skin is warm.  ?   Findings: No rash.  ?Neurological:  ?   General: No focal deficit present.  ?   Mental Status: She is alert and oriented to person, place, and time.  ?Psychiatric:     ?   Behavior: Behavior normal.  ? ? ?No results found for any visits on 06/24/21. ? ? ?   ?Assessment & Plan:  ? ?Problem List Items Addressed This Visit   ? ?  ? Respiratory  ? Pulmonary emphysema ([place])  ?  ? Other  ? Human immunodeficiency virus (HIV) disease ([place])  ? Fall  ?  Patient is following up after a recent  Fall and visit to the emergency department, she has become more dependent on care givers and decling with loss of function needing more help getting in and out of bed, shower and activities of daily living. A shower chair, lift  and shower head will enable patient and caregivers easier access to complete ADLs ? ? ?  ?  ? ?Other Visit Diagnoses   ? ? Primary osteoarthritis of both knees    -  Primary  ? Relevant Orders  ? For home use only DME Other see comment  ? For home use only DME Other see comment  ? For home use only DME Other see comment  ? For home use only DME Other see comment  ? For home use only DME Other see comment  ? For home use only DME Other see comment  ? For home use only DME Other see comment  ? Incontinence in female      ? Loss of weight      ? ?  ? ? ?No orders of the defined types were placed in this encounter. ? ? ?Return for follow up with already scheduled appointment. ? ?Ivy Lynn, NP ? ? ?

## 2021-06-24 NOTE — Assessment & Plan Note (Signed)
Patient is following up after a recent  Fall and visit to the emergency department, she has become more dependent on care givers and decling with loss of function needing more help getting in and out of bed, shower and activities of daily living. A shower chair, lift and shower head will enable patient and caregivers easier access to complete ADLs ? ?

## 2021-06-24 NOTE — Patient Instructions (Signed)

## 2021-06-27 ENCOUNTER — Telehealth (INDEPENDENT_AMBULATORY_CARE_PROVIDER_SITE_OTHER): Payer: Self-pay

## 2021-06-27 ENCOUNTER — Encounter (INDEPENDENT_AMBULATORY_CARE_PROVIDER_SITE_OTHER): Payer: Self-pay | Admitting: Gastroenterology

## 2021-06-27 ENCOUNTER — Other Ambulatory Visit (INDEPENDENT_AMBULATORY_CARE_PROVIDER_SITE_OTHER): Payer: Self-pay

## 2021-06-27 ENCOUNTER — Encounter (INDEPENDENT_AMBULATORY_CARE_PROVIDER_SITE_OTHER): Payer: Self-pay

## 2021-06-27 ENCOUNTER — Ambulatory Visit (INDEPENDENT_AMBULATORY_CARE_PROVIDER_SITE_OTHER): Payer: Medicaid Other | Admitting: Gastroenterology

## 2021-06-27 DIAGNOSIS — R933 Abnormal findings on diagnostic imaging of other parts of digestive tract: Secondary | ICD-10-CM

## 2021-06-27 DIAGNOSIS — R1010 Upper abdominal pain, unspecified: Secondary | ICD-10-CM | POA: Diagnosis not present

## 2021-06-27 DIAGNOSIS — R109 Unspecified abdominal pain: Secondary | ICD-10-CM | POA: Insufficient documentation

## 2021-06-27 MED ORDER — PEG 3350-KCL-NA BICARB-NACL 420 G PO SOLR: 4000.0000 mL | ORAL | 0 refills | Status: DC

## 2021-06-27 NOTE — Patient Instructions (Signed)
Start Bentyl 1 tablet q12h as needed for abdominal pain ?Continue Carafate for now ?Schedule EGD and colonoscopy ? ?

## 2021-06-27 NOTE — Telephone Encounter (Signed)
Ashley [name] [name] [name] [name], CMA  ?

## 2021-06-27 NOTE — Progress Notes (Signed)
Ashley [name], M.D. Gastroenterology & Hepatology [facility] [address] [place], [place] [address]  Primary Care Physician: Raliegh Ip, DO [address] [place] [address]  I will communicate my assessment and recommendations to the referring MD via EMR.  Problems: Chronic abdominal pain  History of Present Illness: Ashley [name] is a 56 y.o. female with past medical history of HIV on Biktarvy, asthma, COPD, diabetes, collagen vascular disease, hypothyroidism, hepatitis C (achieved SVR), depression, who presents for follow up of abdominal pain.  Patient reports in early April 2022 she has presented new onset of abdominal pain in her abdomen diffusely. She describes the pain as "a knot", possibly like cramping episodes intermittently. She states the pain was very severe last week, up to a 10/10 in severity. Sometimes the pain woke her up during her sleep. Had also some diarrhea and states it went away last week after she was seen in the ER in [facility] - was given mag oxide and potassium.  States she was eating mostly soup for the last couple of weeks and had poor appetite  food did not taste very good. She was having frequent vomiting in the past on a daily basis but yesterday was able to eat hamburgers without vomiting. Feels her nausea is slightly better.  Notably, due to the severity of the pain the patient was seen at the ER at [facility] on 06/21/2021.  Labs showed mildly elevated AST of 57, ALT of 28, lipase 26, total bilirubin 1.0, alkaline phosphatase 58, creatinine 1.06, potassium low 3.2 rest of electrolytes within normal limits, CBC with although cell count of 6.5, hemoglobin 13.2 and platelets 205.  She underwent a CT of the abdomen and pelvis with IV contrast that showed prominence of mucosal folds suggestive of possible gastritis, there was also presence of thickening in the colon (worse in the right colon)  likely due to incomplete distention or inflammation. She was prescribed carafate and reports this has helped with the pain and diarrhea. Has also been taking Protonix 40 mg twice a day.  She actually went to [facility] the next day for evaluation due to persistent pain.  The patient denies having any nausea, vomiting, fever, chills, hematochezia, melena, hematemesis, abdominal distention, jaundice, pruritus . Has lost at least 12 lb since February per medical notes.  Last EGD: 10/22/19 - Normal hypopharynx. - Normal esophagus. - Z-line irregular, 35 cm from the incisors. - No endoscopic esophageal abnormality to explain patient's dysphagia. Esophagus dilated. Dilated. - Gastritis. Biopsied - erosive gastritis, neg HP. - Normal duodenal bulb and second portion of the duodenum.  Last Colonoscopy: 05/08/2018 The perianal examination was normal. The digital rectal exam findings include decreased sphincter tone. Three sessile polyps were found in the transverse colon. The polyps were small in size. These were biopsied with a cold forceps for histology. The pathology specimen was placed into Bottle Number 1. External hemorrhoids were found during retroflexion. The hemorrhoids were medium-sized.  Polyps were benign tissue.  Repeat in 10 years  Past Medical History: Past Medical History:  Diagnosis Date   Ankle fracture    Anxiety    Asthma    Collagen vascular disease (HCC)    COPD (chronic obstructive pulmonary disease) (HCC)    Depression    Diabetes mellitus    Elevated LFTs 01/13/2012   GERD (gastroesophageal reflux disease)    Hepatitis C    Hernia    HIV (human  immunodeficiency virus infection) (HCC)    HIV (human immunodeficiency virus infection) (HCC)    Hypertension    Hypothyroidism    Pinched nerve    Scoliosis    TB (tuberculosis)     Past Surgical History: Past Surgical History:  Procedure Laterality Date   BIOPSY  05/08/2018   Procedure:  biopsy;  Surgeon: Malissa Hippo, MD;  Location: [facility];  Service: Endoscopy;;  proximal transverse colon polyp   BIOPSY  10/22/2019   Procedure: BIOPSY;  Surgeon: Malissa Hippo, MD;  Location: [facility];  Service: Endoscopy;;   CESAREAN SECTION     CHOLECYSTECTOMY     COLONOSCOPY N/A 05/08/2018   Procedure: COLONOSCOPY;  Surgeon: Malissa Hippo, MD;  Location: [facility];  Service: Endoscopy;  Laterality: N/A;  2:00   ESOPHAGEAL DILATION  10/22/2019   Procedure: ESOPHAGEAL DILATION;  Surgeon: Malissa Hippo, MD;  Location: [facility];  Service: Endoscopy;;   ESOPHAGOGASTRODUODENOSCOPY (EGD) WITH PROPOFOL N/A 10/22/2019   Procedure: ESOPHAGOGASTRODUODENOSCOPY (EGD) WITH PROPOFOL;  Surgeon: Malissa Hippo, MD;  Location: [facility];  Service: Endoscopy;  Laterality: N/A;  1220   HERNIA REPAIR     x2   INCISIONAL HERNIA REPAIR N/A 09/30/2018   Procedure: OPEN HERNIA REPAIR INCISIONAL WITH MESH;  Surgeon: Lucretia Roers, MD;  Location: [facility] ORS;  Service: General;  Laterality: N/A;   LIVER BIOPSY      Family History: Family History  Problem Relation Age of Onset   Diabetes Mother    Hypertension Mother    Stroke Mother        died September 26, 2013  Cancer Sister    Cancer Maternal Aunt     Social History: Social History   Tobacco Use  Smoking Status Every Day   Packs/day: 1.00   Years: 35.00   Pack years: 35.00   Types: Cigarettes  Smokeless Tobacco Never  Tobacco Comments   1 pack every 2-3 days   Social History   Substance and Sexual Activity  Alcohol Use Not Currently   Alcohol/week: 3.0 standard drinks   Types: 3 Cans of beer per week   Social History   Substance and Sexual Activity  Drug Use Yes   Types: Marijuana   Comment: last cocaine was 07/15/2017.    Allergies: Allergies  Allergen Reactions   Aspirin Other (See Comments)    Liver problems   Roxicodone [Oxycodone Hcl] Itching   Tomato     Medications: Current Outpatient  Medications  Medication Sig Dispense Refill   bictegravir-emtricitabine-tenofovir AF (BIKTARVY) 50-200-25 MG TABS tablet Take 1 tablet by mouth daily. 30 tablet 11   CIPRODEX OTIC suspension SHAKE LIQUID AND INSTILL 4 DROPS TO LEFT EAR TWICE DAILY FOR 7 DAYS 7.5 mL 0   diclofenac Sodium (VOLTAREN) 1 % GEL Apply 4 g topically 4 (four) times daily as needed (knee pain/ arthritis). 300 g 2   FLUoxetine (PROZAC) 20 MG capsule TAKE ONE CAPSULE BY MOUTH DAILY 90 capsule 0   hydrocortisone cream 0.5 % Apply 1 application topically 2 (two) times daily. 30 g 0   loratadine (CLARITIN) 10 MG tablet Take 1 tablet (10 mg total) by mouth daily. 90 tablet 2   magnesium oxide (MAG-OX) 400 (240 Mg) MG tablet Take 1 tablet by mouth daily.     Miconazole Nitrate Applicator (MONISTAT 3 COMBO PACK APP) 200 & 2 MG-% (9GM) KIT UAD 24 g 0   mometasone (NASONEX) 50 MCG/ACT nasal spray  Place 2 sprays into the nose daily. 1 each 12   Multiple Vitamin (TAB-A-VITE) TABS TAKE ONE TABLET BY MOUTH DAILY 90 tablet 1   ofloxacin (FLOXIN OTIC) 0.3 % OTIC solution Place 5 drops into both ears daily. 10 mL 0   ondansetron (ZOFRAN-ODT) 4 MG disintegrating tablet 4mg  ODT q4 hours prn nausea/vomit 12 tablet 0   pantoprazole (PROTONIX) 40 MG tablet TAKE ONE TABLET BY MOUTH TWICE DAILY BEFORE MEALS 60 tablet 0   potassium chloride SA (KLOR-CON M) 20 MEQ tablet Take 20 mEq by mouth 2 (two) times daily.     QUEtiapine (SEROQUEL) 300 MG tablet Take 1 tablet (300 mg total) by mouth at bedtime. 30 tablet 1   sucralfate (CARAFATE) 1 GM/10ML suspension Take 10 mLs (1 g total) by mouth 4 (four) times daily -  with meals and at bedtime. 420 mL 0   tiZANidine (ZANAFLEX) 4 MG tablet TAKE 1 TABLET(4 MG) BY MOUTH EVERY 8 HOURS AS NEEDED FOR MUSCLE SPASMS 30 tablet 5   VENTOLIN HFA 108 (90 Base) MCG/ACT inhaler INHALE 2 PUFFS INTO THE LUNGS EVERY 6 HOURS AS NEEDED FOR WHEEZING OR SHORTNESS OF BREATH 18 g 5   No current facility-administered  medications for this visit.    Review of Systems: GENERAL: negative for malaise, night sweats HEENT: No changes in hearing or vision, no nose bleeds or other nasal problems. NECK: Negative for lumps, goiter, pain and significant neck swelling RESPIRATORY: Negative for cough, wheezing CARDIOVASCULAR: Negative for chest pain, leg swelling, palpitations, orthopnea GI: SEE HPI MUSCULOSKELETAL: Negative for joint pain or swelling, back pain, and muscle pain. SKIN: Negative for lesions, rash PSYCH: Negative for sleep disturbance, mood disorder and recent psychosocial stressors. HEMATOLOGY Negative for prolonged bleeding, bruising easily, and swollen nodes. ENDOCRINE: Negative for cold or heat intolerance, polyuria, polydipsia and goiter. NEURO: negative for tremor, gait imbalance, syncope and seizures. The remainder of the review of systems is noncontributory.   Physical Exam: BP (!) 174/86 (BP Location: Right Arm, Patient Position: Sitting, Cuff Size: Small)   Pulse 84   Temp 97.6 F (36.4 C) (Oral)   Ht 4' 7.5" (1.41 m)   Wt 115 lb 12.8 oz (52.5 kg)   BMI 26.43 kg/m  GENERAL: The patient is AO x3, in no acute distress. HEENT: Head is normocephalic and atraumatic. EOMI are intact. Mouth is well hydrated and without lesions. NECK: Supple. No masses LUNGS: Clear to auscultation. No presence of rhonchi/wheezing/rales. Adequate chest expansion HEART: RRR, normal s1 and s2. ABDOMEN: tender upon palpation of the upper abdomen and LLQ, no guarding, no peritoneal signs, and nondistended. BS +. No masses. EXTREMITIES: Without any cyanosis, clubbing, rash, lesions or edema. NEUROLOGIC: AOx3, no focal motor deficit. SKIN: no jaundice, no rashes  Imaging/Labs: as above  I personally reviewed and interpreted the available labs, imaging and endoscopic files.  Impression and Plan: Ashley [name] is a 56 y.o. female with past medical history of HIV on Biktarvy, asthma, COPD, diabetes, collagen  vascular disease, hypothyroidism, hepatitis C (achieved SVR), depression, who presents for follow up of abdominal pain.  The patient has undergone recent investigations with cross-sectional abdominal imaging that were unremarkable for any intra-abdominal etiologies of her abdominal pain.  She is still presenting significant intermittent tenderness in her abdomen.  Due to this we will evaluate her symptoms further with an EGD and colonoscopy.  Is possible that her symptoms are related to a functional disorder as she has endorsed similar episodes of abdominal pain in  the past.  She would benefit from taking Bentyl as needed to improve her abdominal pain.  - Start Bentyl 1 tablet q12h as needed for abdominal pain - Continue Carafate for now - Schedule EGD and colonoscopy  All questions were answered.      Dolores Frame, MD Gastroenterology and Hepatology [facility]

## 2021-06-30 ENCOUNTER — Telehealth: Payer: Self-pay | Admitting: Family Medicine

## 2021-06-30 NOTE — Telephone Encounter (Signed)
Pt will address the bumps in vaginal area during visit next Tues with Gottschalk. Advised pt to increase water intake, rest, use cane when walking. If she develops chest pain, SOB, worsening headache she needs to go to ER or urgent care. Pt understood. She will go ahead and take Tylenol for her " little HA" ?

## 2021-07-05 ENCOUNTER — Other Ambulatory Visit (HOSPITAL_COMMUNITY)
Admission: RE | Admit: 2021-07-05 | Discharge: 2021-07-05 | Disposition: A | Discharge status: Home / Self Care | Payer: Medicaid Other | Source: Ambulatory Visit | Attending: Family Medicine | Admitting: Family Medicine

## 2021-07-05 ENCOUNTER — Ambulatory Visit (INDEPENDENT_AMBULATORY_CARE_PROVIDER_SITE_OTHER): Payer: Medicaid Other | Admitting: Family Medicine

## 2021-07-05 ENCOUNTER — Encounter: Payer: Self-pay | Admitting: Family Medicine

## 2021-07-05 VITALS — BP 145/90 | HR 108 | Temp 98.0°F | Ht <= 58 in | Wt 115.0 lb

## 2021-07-05 DIAGNOSIS — R42 Dizziness and giddiness: Secondary | ICD-10-CM

## 2021-07-05 DIAGNOSIS — R112 Nausea with vomiting, unspecified: Secondary | ICD-10-CM

## 2021-07-05 DIAGNOSIS — Z711 Person with feared health complaint in whom no diagnosis is made: Secondary | ICD-10-CM | POA: Diagnosis present

## 2021-07-05 DIAGNOSIS — E119 Type 2 diabetes mellitus without complications: Secondary | ICD-10-CM

## 2021-07-05 DIAGNOSIS — I152 Hypertension secondary to endocrine disorders: Secondary | ICD-10-CM

## 2021-07-05 DIAGNOSIS — E1159 Type 2 diabetes mellitus with other circulatory complications: Secondary | ICD-10-CM

## 2021-07-05 DIAGNOSIS — R197 Diarrhea, unspecified: Secondary | ICD-10-CM

## 2021-07-05 DIAGNOSIS — B3731 Acute candidiasis of vulva and vagina: Secondary | ICD-10-CM

## 2021-07-05 LAB — WET PREP FOR TRICH, YEAST, CLUE
Clue Cell Exam: NEGATIVE
Trichomonas Exam: NEGATIVE
Yeast Exam: POSITIVE — AB

## 2021-07-05 LAB — BAYER DCA HB A1C WAIVED: HB A1C (BAYER DCA - WAIVED): 5 % (ref 4.8–5.6)

## 2021-07-05 MED ORDER — FLUOXETINE HCL 20 MG PO CAPS: 20.0000 mg | ORAL_CAPSULE | Freq: Every day | ORAL | 3 refills | Status: DC

## 2021-07-05 MED ORDER — FLUCONAZOLE 150 MG PO TABS: 150.0000 mg | ORAL_TABLET | Freq: Once | ORAL | 0 refills | Status: AC

## 2021-07-05 MED ORDER — AMLODIPINE BESYLATE 2.5 MG PO TABS: 2.5000 mg | ORAL_TABLET | Freq: Every day | ORAL | 3 refills | Status: DC

## 2021-07-05 NOTE — Progress Notes (Signed)
? ?Subjective: ?CC:DM ?PCP: Janora Norlander, DO ?QTM:Ashley [name] is a 56 y.o. female presenting to clinic today for: ? ?1. Type 2 Diabetes with hypertension:  ?Has diet-controlled diabetes.  She notes that blood pressure has been elevated a couple of times over her last few office visits.  She admits to an episode where she was extremely dizzy and fell down and hit her head. ? ?Last eye exam: Up-to-date ?Last foot exam: needs ?Last A1c:  ?Lab Results  ?Component Value Date  ? HGBA1C 5.1 01/04/2021  ? ?Nephropathy screen indicated?: needs ?Last flu, zoster and/or pneumovax:  ?Immunization History  ?Administered Date(s) Administered  ? Hepatitis A 02/18/2002, 04/22/2002, 11/06/2005  ? Hepatitis B 02/18/2002, 04/22/2002, 11/06/2005  ? Influenza Split 12/19/2011  ? Influenza,inj,Quad PF,6+ Mos 02/02/2015, 12/17/2015, 12/09/2018, 12/22/2019, 01/04/2021  ? PNEUMOCOCCAL CONJUGATE-20 04/14/2021  ? Pneumococcal Conjugate-13 10/17/2017  ? Pneumococcal Polysaccharide-23 11/06/2005, 06/11/2009  ? Rabies, IM 04/19/2014, 04/22/2014, 05/09/2014  ? Td 07/18/1988  ? Tdap 11/06/2005  ? ? ?ROS: No chest pain, shortness of breath reported ? ?2.  Vaginal lesions ?Patient reports several vaginal lesions noted.  She is in a sexual relationship.  Uncertain if this gentleman is monogamous or not.  She has a history of trichomonas and HIV that is controlled but no history of HSV.  She does not shave the vaginal area.  Not having any abnormal vaginal discharge but would like to be screened for everything ? ? ?ROS: Per HPI ? ?Allergies  ?Allergen Reactions  ? Aspirin Other (See Comments)  ?  Liver problems  ? Roxicodone [Oxycodone Hcl] Itching  ? Tomato   ? ?Past Medical History:  ?Diagnosis Date  ? Ankle fracture   ? Anxiety   ? Asthma   ? Collagen vascular disease ([place])   ? COPD (chronic obstructive pulmonary disease) ([place])   ? Depression   ? Diabetes mellitus   ? Elevated LFTs 01/13/2012  ? GERD (gastroesophageal reflux disease)   ?  Hepatitis C   ? Hernia   ? HIV (human immunodeficiency virus infection) ([place])   ? HIV (human immunodeficiency virus infection) ([place])   ? Hypertension   ? Hypothyroidism   ? Pinched nerve   ? Scoliosis   ? TB (tuberculosis)   ? ? ?Current Outpatient Medications:  ?  bictegravir-emtricitabine-tenofovir AF (BIKTARVY) 50-200-25 MG TABS tablet, Take 1 tablet by mouth daily., Disp: 30 tablet, Rfl: 11 ?  CIPRODEX OTIC suspension, SHAKE LIQUID AND INSTILL 4 DROPS TO LEFT EAR TWICE DAILY FOR 7 DAYS, Disp: 7.5 mL, Rfl: 0 ?  diclofenac Sodium (VOLTAREN) 1 % GEL, Apply 4 g topically 4 (four) times daily as needed (knee pain/ arthritis)., Disp: 300 g, Rfl: 2 ?  FLUoxetine (PROZAC) 20 MG capsule, TAKE ONE CAPSULE BY MOUTH DAILY, Disp: 90 capsule, Rfl: 0 ?  hydrocortisone cream 0.5 %, Apply 1 application topically 2 (two) times daily., Disp: 30 g, Rfl: 0 ?  loratadine (CLARITIN) 10 MG tablet, Take 1 tablet (10 mg total) by mouth daily., Disp: 90 tablet, Rfl: 2 ?  magnesium oxide (MAG-OX) 400 (240 Mg) MG tablet, Take 1 tablet by mouth daily., Disp: , Rfl:  ?  Miconazole Nitrate Applicator (MONISTAT 3 COMBO PACK APP) 200 & 2 MG-% (9GM) KIT, UAD, Disp: 24 g, Rfl: 0 ?  mometasone (NASONEX) 50 MCG/ACT nasal spray, Place 2 sprays into the nose daily., Disp: 1 each, Rfl: 12 ?  Multiple Vitamin (TAB-A-VITE) TABS, TAKE ONE TABLET BY MOUTH DAILY, Disp: 90 tablet, Rfl:  1 ?  ofloxacin (FLOXIN OTIC) 0.3 % OTIC solution, Place 5 drops into both ears daily., Disp: 10 mL, Rfl: 0 ?  ondansetron (ZOFRAN-ODT) 4 MG disintegrating tablet, 67m ODT q4 hours prn nausea/vomit, Disp: 12 tablet, Rfl: 0 ?  pantoprazole (PROTONIX) 40 MG tablet, TAKE ONE TABLET BY MOUTH TWICE DAILY BEFORE MEALS, Disp: 60 tablet, Rfl: 0 ?  polyethylene glycol-electrolytes (TRILYTE) 420 g solution, Take 4,000 mLs by mouth as directed., Disp: 4000 mL, Rfl: 0 ?  potassium chloride SA (KLOR-CON M) 20 MEQ tablet, Take 20 mEq by mouth 2 (two) times daily., Disp: , Rfl:  ?  QUEtiapine  (SEROQUEL) 300 MG tablet, Take 1 tablet (300 mg total) by mouth at bedtime., Disp: 30 tablet, Rfl: 1 ?  sucralfate (CARAFATE) 1 GM/10ML suspension, Take 10 mLs (1 g total) by mouth 4 (four) times daily -  with meals and at bedtime., Disp: 420 mL, Rfl: 0 ?  tiZANidine (ZANAFLEX) 4 MG tablet, TAKE 1 TABLET(4 MG) BY MOUTH EVERY 8 HOURS AS NEEDED FOR MUSCLE SPASMS, Disp: 30 tablet, Rfl: 5 ?  VENTOLIN HFA 108 (90 Base) MCG/ACT inhaler, INHALE 2 PUFFS INTO THE LUNGS EVERY 6 HOURS AS NEEDED FOR WHEEZING OR SHORTNESS OF BREATH, Disp: 18 g, Rfl: 5 ?Social History  ? ?Socioeconomic History  ? Marital status: Single  ?  Spouse name: Not on file  ? Number of children: 2  ? Years of education: Not on file  ? Highest education level: Not on file  ?Occupational History  ? Occupation: Disability  ?Tobacco Use  ? Smoking status: Every Day  ?  Packs/day: 1.00  ?  Years: 35.00  ?  Pack years: 35.00  ?  Types: Cigarettes  ? Smokeless tobacco: Never  ? Tobacco comments:  ?  1 pack every 2-3 days  ?Vaping Use  ? Vaping Use: Some days  ?Substance and Sexual Activity  ? Alcohol use: Not Currently  ?  Alcohol/week: 3.0 standard drinks  ?  Types: 3 Cans of beer per week  ? Drug use: Yes  ?  Types: Marijuana  ?  Comment: last cocaine was 07/15/2017.  ? Sexual activity: Not Currently  ?  Birth control/protection: Condom, None  ?  Comment: patient given condoms  ?Other Topics Concern  ? Not on file  ?Social History Narrative  ? Pt lives in an apartment by herself.  Pt stated that she is married and that her husband is in a f[employer]porate treasurer and that husband is being released yesterday or today.  Asked several times who Pt's psychiatric provider is.  ? ?Social Determinants of Health  ? ?Financial Resource Strain: Not on file  ?Food Insecurity: Not on file  ?Transportation Needs: Not on file  ?Physical Activity: Not on file  ?Stress: Not on file  ?Social Connections: Not on file  ?Intimate Partner Violence: Not on file  ? ?Family History   ?Problem Relation Age of Onset  ? Diabetes Mother   ? Hypertension Mother   ? Stroke Mother   ?     died [id number]? Cancer Sister   ? Cancer Maternal Aunt   ? ? ?Objective: ?Office vital signs reviewed. ?BP (!) 162/132   Pulse (!) 108   Temp 98 ?F (36.7 ?C)   Ht 4' 7.5" (1.41 m)   Wt 115 lb (52.2 kg)   SpO2 96%   BMI 26.25 kg/m?  ? ?Physical Examination:  ?General: Awake, alert, nontoxic female, No acute distress ?HEENT: Sclera slightly  discolored but I would not call her icteric ?Cardio: regular rate and rhythm, S1S2 heard, no murmurs appreciated ?Pulm: clear to auscultation bilaterally, no wheezes, rhonchi or rales; normal work of breathing on room air ?GU: Mons pubis with a couple of pastilles consistent with folliculitis.  Scant white vaginal discharge noted externally ?MSK: Ambulating independently ?Neuro: Alert and oriented ? ?Assessment/ Plan: ?56 y.o. female  ? ?Diet-controlled diabetes mellitus ([place]) - Plan: Bayer DCA Hb A1c Waived, Microalbumin / creatinine urine ratio ? ?Hypertension associated with diabetes ([place]) - Plan: amLODipine (NORVASC) 2.5 MG tablet ? ?Concern about STD in female without diagnosis - Plan: GC/Chlamydia probe amp ([place])not at [facility], [place], YEAST, CLUE, HSV(herpes simplex vrs) 1+2 ab-IgG, RPR ? ?Dizziness - Plan: CMP14+EGFR, Magnesium ? ?Nausea vomiting and diarrhea - Plan: CMP14+EGFR, Magnesium ? ?A1c shows ongoing diet-controlled diabetes with A1c of 5.0 today.  We will need to perform diabetic foot exam but we spent more of our time on vaginal concerns today. ? ?Her blood pressure is not well controlled and therefore we will start Norvasc 2.5 mg daily.   ? ?Wet prep, urine gonorrhea chlamydia and blood labs were obtained to evaluate for STD.  I suspect that the vaginal lesions are folliculitis however.  We discussed warm compresses and sitz bath's.  We discussed reasons for reevaluation ? ?Uncertain etiology of dizziness but concerned that it may be  blood pressure related.  She had some evidence of dehydration and electrolyte imbalance with the ongoing nausea and vomiting, which will be worked up by GI soon.  Check electrolytes, renal and liver function (her

## 2021-07-05 NOTE — Addendum Note (Signed)
Addended by: Janora Norlander on: 07/05/2021 03:10 PM ? ? Modules accepted: Orders ? ?

## 2021-07-05 NOTE — Patient Instructions (Signed)
You had labs performed today.  You will be contacted with the results of the labs once they are available, usually in the next 3 business days for routine lab work.  If you have an active my chart account, they will be released to your MyChart.  If you prefer to have these labs released to you via telephone, please let us know.     

## 2021-07-06 ENCOUNTER — Other Ambulatory Visit: Payer: Self-pay | Admitting: Family Medicine

## 2021-07-06 DIAGNOSIS — B009 Herpesviral infection, unspecified: Secondary | ICD-10-CM

## 2021-07-06 DIAGNOSIS — B20 Human immunodeficiency virus [HIV] disease: Secondary | ICD-10-CM

## 2021-07-06 LAB — CMP14+EGFR
ALT: 28 IU/L (ref 0–32)
AST: 41 IU/L — ABNORMAL HIGH (ref 0–40)
Albumin/Globulin Ratio: 2.1 (ref 1.2–2.2)
Albumin: 4.4 g/dL (ref 3.8–4.9)
Alkaline Phosphatase: 62 IU/L (ref 44–121)
BUN/Creatinine Ratio: 7 — ABNORMAL LOW (ref 9–23)
BUN: 8 mg/dL (ref 6–24)
Bilirubin Total: 0.2 mg/dL (ref 0.0–1.2)
CO2: 24 mmol/L (ref 20–29)
Calcium: 9.4 mg/dL (ref 8.7–10.2)
Chloride: 102 mmol/L (ref 96–106)
Creatinine, Ser: 1.18 mg/dL — ABNORMAL HIGH (ref 0.57–1.00)
Globulin, Total: 2.1 g/dL (ref 1.5–4.5)
Glucose: 129 mg/dL — ABNORMAL HIGH (ref 70–99)
Potassium: 3.9 mmol/L (ref 3.5–5.2)
Sodium: 142 mmol/L (ref 134–144)
Total Protein: 6.5 g/dL (ref 6.0–8.5)
eGFR: 54 mL/min/1.73 — ABNORMAL LOW (ref >59)

## 2021-07-06 LAB — GC/CHLAMYDIA PROBE AMP (~~LOC~~) NOT AT ARMC
Chlamydia: NEGATIVE
Comment: NEGATIVE
Comment: NORMAL
Neisseria Gonorrhea: NEGATIVE

## 2021-07-06 LAB — MICROALBUMIN / CREATININE URINE RATIO
Creatinine, Urine: 455.5 mg/dL
Microalb/Creat Ratio: 34 mg/g{creat} — ABNORMAL HIGH (ref 0–29)
Microalbumin, Urine: 156 ug/mL

## 2021-07-06 LAB — RPR: RPR Ser Ql: NONREACTIVE

## 2021-07-06 LAB — HSV(HERPES SIMPLEX VRS) I + II AB-IGG
HSV 1 Glycoprotein G Ab, IgG: <0.91 index (ref 0.00–0.90)
HSV 2 IgG, Type Spec: 8.78 index — ABNORMAL HIGH (ref 0.00–0.90)

## 2021-07-06 LAB — MAGNESIUM: Magnesium: 2.3 mg/dL (ref 1.6–2.3)

## 2021-07-06 MED ORDER — VALACYCLOVIR HCL 1 G PO TABS: 1000.0000 mg | ORAL_TABLET | Freq: Two times a day (BID) | ORAL | 0 refills | Status: AC

## 2021-07-07 ENCOUNTER — Other Ambulatory Visit: Payer: Self-pay | Admitting: Family Medicine

## 2021-07-08 ENCOUNTER — Other Ambulatory Visit (INDEPENDENT_AMBULATORY_CARE_PROVIDER_SITE_OTHER): Payer: Self-pay | Admitting: Family Medicine

## 2021-07-08 DIAGNOSIS — K219 Gastro-esophageal reflux disease without esophagitis: Secondary | ICD-10-CM

## 2021-07-27 ENCOUNTER — Other Ambulatory Visit (HOSPITAL_COMMUNITY): Payer: Medicaid Other

## 2021-07-29 ENCOUNTER — Ambulatory Visit (HOSPITAL_COMMUNITY)
Admission: RE | Admit: 2021-07-29 | Discharge: 2021-07-29 | Disposition: A | Discharge status: Home / Self Care | Payer: Medicaid Other | Attending: Gastroenterology | Admitting: Gastroenterology

## 2021-07-29 ENCOUNTER — Encounter (HOSPITAL_COMMUNITY): Payer: Self-pay | Admitting: Gastroenterology

## 2021-07-29 ENCOUNTER — Other Ambulatory Visit: Payer: Self-pay

## 2021-07-29 ENCOUNTER — Ambulatory Visit (HOSPITAL_BASED_OUTPATIENT_CLINIC_OR_DEPARTMENT_OTHER): Payer: Medicaid Other | Admitting: Certified Registered Nurse Anesthetist

## 2021-07-29 ENCOUNTER — Encounter (HOSPITAL_COMMUNITY)
Admission: RE | Disposition: A | Discharge status: Home / Self Care | Payer: Self-pay | Source: Home / Self Care | Attending: Gastroenterology

## 2021-07-29 ENCOUNTER — Ambulatory Visit (HOSPITAL_COMMUNITY): Payer: Medicaid Other | Admitting: Certified Registered Nurse Anesthetist

## 2021-07-29 DIAGNOSIS — E119 Type 2 diabetes mellitus without complications: Secondary | ICD-10-CM | POA: Diagnosis not present

## 2021-07-29 DIAGNOSIS — R1033 Periumbilical pain: Secondary | ICD-10-CM | POA: Diagnosis not present

## 2021-07-29 DIAGNOSIS — F419 Anxiety disorder, unspecified: Secondary | ICD-10-CM | POA: Insufficient documentation

## 2021-07-29 DIAGNOSIS — K635 Polyp of colon: Secondary | ICD-10-CM

## 2021-07-29 DIAGNOSIS — K319 Disease of stomach and duodenum, unspecified: Secondary | ICD-10-CM | POA: Insufficient documentation

## 2021-07-29 DIAGNOSIS — K648 Other hemorrhoids: Secondary | ICD-10-CM | POA: Diagnosis not present

## 2021-07-29 DIAGNOSIS — I10 Essential (primary) hypertension: Secondary | ICD-10-CM | POA: Insufficient documentation

## 2021-07-29 DIAGNOSIS — Z79899 Other long term (current) drug therapy: Secondary | ICD-10-CM | POA: Insufficient documentation

## 2021-07-29 DIAGNOSIS — F32A Depression, unspecified: Secondary | ICD-10-CM | POA: Insufficient documentation

## 2021-07-29 DIAGNOSIS — K2970 Gastritis, unspecified, without bleeding: Secondary | ICD-10-CM

## 2021-07-29 DIAGNOSIS — K219 Gastro-esophageal reflux disease without esophagitis: Secondary | ICD-10-CM | POA: Diagnosis not present

## 2021-07-29 DIAGNOSIS — K2980 Duodenitis without bleeding: Secondary | ICD-10-CM | POA: Diagnosis not present

## 2021-07-29 DIAGNOSIS — Z21 Asymptomatic human immunodeficiency virus [HIV] infection status: Secondary | ICD-10-CM | POA: Diagnosis not present

## 2021-07-29 DIAGNOSIS — K644 Residual hemorrhoidal skin tags: Secondary | ICD-10-CM | POA: Diagnosis not present

## 2021-07-29 DIAGNOSIS — F17210 Nicotine dependence, cigarettes, uncomplicated: Secondary | ICD-10-CM | POA: Diagnosis not present

## 2021-07-29 DIAGNOSIS — D122 Benign neoplasm of ascending colon: Secondary | ICD-10-CM | POA: Insufficient documentation

## 2021-07-29 DIAGNOSIS — J449 Chronic obstructive pulmonary disease, unspecified: Secondary | ICD-10-CM | POA: Diagnosis not present

## 2021-07-29 DIAGNOSIS — K449 Diaphragmatic hernia without obstruction or gangrene: Secondary | ICD-10-CM | POA: Diagnosis not present

## 2021-07-29 DIAGNOSIS — Z7951 Long term (current) use of inhaled steroids: Secondary | ICD-10-CM | POA: Diagnosis not present

## 2021-07-29 HISTORY — PX: COLONOSCOPY WITH PROPOFOL: SHX5780

## 2021-07-29 HISTORY — PX: ESOPHAGOGASTRODUODENOSCOPY (EGD) WITH PROPOFOL: SHX5813

## 2021-07-29 HISTORY — PX: BIOPSY: SHX5522

## 2021-07-29 LAB — HM COLONOSCOPY

## 2021-07-29 SURGERY — COLONOSCOPY WITH PROPOFOL
Anesthesia: General

## 2021-07-29 MED ORDER — PROPOFOL 500 MG/50ML IV EMUL
INTRAVENOUS | Status: DC | PRN
  Administered 2021-07-29: 200 ug/kg/min via INTRAVENOUS

## 2021-07-29 MED ORDER — PROPOFOL 10 MG/ML IV BOLUS
INTRAVENOUS | Status: DC | PRN
  Administered 2021-07-29 (×3): 50 mg via INTRAVENOUS
  Administered 2021-07-29: 100 mg via INTRAVENOUS

## 2021-07-29 MED ORDER — PROPOFOL 500 MG/50ML IV EMUL
INTRAVENOUS | Status: AC
  Filled 2021-07-29: qty 50

## 2021-07-29 MED ORDER — LACTATED RINGERS IV SOLN: INTRAVENOUS | Status: DC

## 2021-07-29 MED ORDER — GLYCOPYRROLATE PF 0.2 MG/ML IJ SOSY
PREFILLED_SYRINGE | INTRAMUSCULAR | Status: AC
  Filled 2021-07-29: qty 1

## 2021-07-29 MED ORDER — LACTATED RINGERS IV SOLN: INTRAVENOUS | Status: DC | PRN

## 2021-07-29 MED ORDER — GLYCOPYRROLATE PF 0.2 MG/ML IJ SOSY
PREFILLED_SYRINGE | INTRAMUSCULAR | Status: DC | PRN
  Administered 2021-07-29: .2 mg via INTRAVENOUS

## 2021-07-29 NOTE — Transfer of Care (Signed)
Immediate Anesthesia Transfer of Care Note  Patient: Ashley [name]  Procedure(s) Performed: COLONOSCOPY WITH PROPOFOL ESOPHAGOGASTRODUODENOSCOPY (EGD) WITH PROPOFOL BIOPSY  Patient Location: PACU  Anesthesia Type:General  Level of Consciousness: awake, alert  and oriented  Airway & Oxygen Therapy: Patient Spontanous Breathing  Post-op Assessment: Report given to RN, Post -op Vital signs reviewed and stable, Patient moving all extremities X 4 and Patient able to stick tongue midline  Post vital signs: Reviewed  Last Vitals:  Vitals Value Taken Time  BP 109/63   Temp 96.7   Pulse 77   Resp 16   SpO2 96     Last Pain:  Vitals:   07/29/21 1142  TempSrc:   PainSc: 0-No pain      Patients Stated Pain Goal: 7 (81/01/75 1025)  Complications: No notable events documented.

## 2021-07-29 NOTE — H&P (Signed)
Ashley [name] is an 56 y.o. female.   Chief Complaint: abdominal pain and nausea HPI: Ashley [name] is a 56 y.o. female with past medical history of HIV on Biktarvy, asthma, COPD, diabetes, collagen vascular disease, hypothyroidism, hepatitis C (achieved SVR), depression, who presents for evaluation of abdominal pain and nausea.  Since the last time she was seen in the clinic, she has felt improvement of her nausea and pain while taking Bentyl as needed.  States that her pain has decreased significantly down to a 2 or 3 out of 10 while her nausea is no longer present.  Past Medical History:  Diagnosis Date   Ankle fracture    Anxiety    Asthma    Collagen vascular disease ([facility])    COPD (chronic obstructive pulmonary disease) ([place])    Depression    Diabetes mellitus    Elevated LFTs 01/13/2012   GERD (gastroesophageal reflux disease)    Hepatitis C    Hernia    HIV (human immunodeficiency virus infection) ([place])    HIV (human immunodeficiency virus infection) ([place])    Hypertension    Hypothyroidism    Pinched nerve    Scoliosis    TB (tuberculosis)     Past Surgical History:  Procedure Laterality Date   BIOPSY  05/08/2018   Procedure: biopsy;  Surgeon: Rogene Houston, MD;  [facility];  Service: Endoscopy;;  proximal transverse colon polyp   BIOPSY  10/22/2019   Procedure: BIOPSY;  Surgeon: Rogene Houston, MD;  [facility];  Service: Endoscopy;;   CESAREAN SECTION     CHOLECYSTECTOMY     COLONOSCOPY N/A 05/08/2018   Procedure: COLONOSCOPY;  Surgeon: Rogene Houston, MD;  [facility]: [facility];  Service: Endoscopy;  Laterality: N/A;  2:00   ESOPHAGEAL DILATION  10/22/2019   Procedure: ESOPHAGEAL DILATION;  Surgeon: Rogene Houston, MD;  [facility];  Service: Endoscopy;;   ESOPHAGOGASTRODUODENOSCOPY (EGD) WITH PROPOFOL N/A 10/22/2019   Procedure: ESOPHAGOGASTRODUODENOSCOPY (EGD) WITH PROPOFOL;  Surgeon: Rogene Houston, MD;  [facility];  Service: Endoscopy;  Laterality: N/A;  [place]     x2   INCISIONAL HERNIA REPAIR N/A 09/30/2018   Procedure: OPEN HERNIA REPAIR INCISIONAL WITH MESH;  Surgeon: Virl Cagey, MD;  Location: [facility];  Service: General;  Laterality: N/A;   LIVER BIOPSY      Family History  Problem Relation Age of Onset   Diabetes Mother    Hypertension Mother    Stroke Mother        died Sep 26, 2013  Cancer Sister    Cancer Maternal Aunt    Social History:  reports that she has been smoking cigarettes. She has a 35.00 pack-year smoking history. She has never used smokeless tobacco. She reports that she does not currently use alcohol after a past usage of about 3.0 standard drinks per week. She reports current drug use. Drug: Marijuana.  Allergies:  Allergies  Allergen Reactions   Aspirin Other (See Comments)    Liver problems   Roxicodone [Oxycodone Hcl] Itching   Tomato Other (See Comments)    Acid reflux.    Medications Prior to Admission  Medication Sig Dispense Refill   acetaminophen (TYLENOL) 500 MG tablet Take 500 mg by mouth every 6 (six) hours as needed (pain.).     amLODipine (NORVASC) 2.5 MG tablet Take 1 tablet (2.5 mg total) by mouth daily. For blood  pressure (Patient taking differently: Take 2.5 mg by mouth at bedtime. For blood pressure) 90 tablet 3   bictegravir-emtricitabine-tenofovir AF (BIKTARVY) 50-200-25 MG TABS tablet Take 1 tablet by mouth daily. (Patient taking differently: Take 1 tablet by mouth every evening.) 30 tablet 11   diclofenac Sodium (VOLTAREN) 1 % GEL Apply 4 g topically 4 (four) times daily as needed (knee pain/ arthritis). 300 g 2   FLUoxetine (PROZAC) 20 MG capsule Take 1 capsule (20 mg total) by mouth daily. (Patient taking differently: Take 20 mg by mouth at bedtime.) 90 capsule 3   loratadine (CLARITIN) 10 MG tablet Take 1 tablet (10 mg total) by mouth daily. 90 tablet 2   mometasone (NASONEX) 50 MCG/ACT nasal spray Place 2 sprays  into the nose daily. (Patient taking differently: Place 2 sprays into the nose daily as needed (allergies.).) 1 each 12   Multiple Vitamin (TAB-A-VITE) TABS TAKE ONE TABLET BY MOUTH DAILY (Patient taking differently: Take 1 tablet by mouth at bedtime.) 90 tablet 1   ondansetron (ZOFRAN-ODT) 4 MG disintegrating tablet '4mg'$  ODT q4 hours prn nausea/vomit 12 tablet 0   pantoprazole (PROTONIX) 40 MG tablet TAKE ONE TABLET BY MOUTH TWICE DAILY BEFORE MEALS 60 tablet 5   polyethylene glycol-electrolytes (TRILYTE) 420 g solution Take 4,000 mLs by mouth as directed. 4000 mL 0   sucralfate (CARAFATE) 1 GM/10ML suspension Take 10 mLs (1 g total) by mouth 4 (four) times daily -  with meals and at bedtime. (Patient taking differently: Take 1 g by mouth 4 (four) times daily as needed (upset stomach).) 420 mL 0   tiZANidine (ZANAFLEX) 4 MG tablet Take 0.5-1 tablets (2-4 mg total) by mouth every 8 (eight) hours as needed for muscle spasms (USE SPARINGLY.). 30 tablet 5   VENTOLIN HFA 108 (90 Base) MCG/ACT inhaler INHALE 2 PUFFS INTO THE LUNGS EVERY 6 HOURS AS NEEDED FOR WHEEZING OR SHORTNESS OF BREATH 18 g 5    No results found for this or any previous visit (from the past 27 hour(s)). No results found.  Review of Systems  Constitutional: Negative.   HENT: Negative.    Eyes: Negative.   Respiratory: Negative.    Cardiovascular: Negative.   Gastrointestinal:  Positive for abdominal pain and nausea.  Endocrine: Negative.   Genitourinary: Negative.   Musculoskeletal: Negative.   Skin: Negative.   Allergic/Immunologic: Negative.   Neurological: Negative.   Hematological: Negative.   Psychiatric/Behavioral: Negative.     Blood pressure (!) 141/78, pulse 78, temperature 97.8 F (36.6 C), temperature source Oral, resp. rate 19, height 4' 7.5" (1.41 m), weight 53.5 kg, SpO2 99 %. Physical Exam  GENERAL: The patient is AO x3, in no acute distress. HEENT: Head is normocephalic and atraumatic. EOMI are intact.  Mouth is well hydrated and without lesions. NECK: Supple. No masses LUNGS: Clear to auscultation. No presence of rhonchi/wheezing/rales. Adequate chest expansion HEART: RRR, normal s1 and s2. ABDOMEN: Soft, nontender, no guarding, no peritoneal signs, and nondistended. BS +. No masses. EXTREMITIES: Without any cyanosis, clubbing, rash, lesions or edema. NEUROLOGIC: AOx3, no focal motor deficit. SKIN: no jaundice, no rashes  Assessment/Plan SHARONNA VINJE is a 56 y.o. female with past medical history of HIV on Biktarvy, asthma, COPD, diabetes, collagen vascular disease, hypothyroidism, hepatitis C (achieved SVR), depression, who presents for evaluation of abdominal pain and nausea.  We will proceed with EGD and colonoscopy.  Harvel Quale, MD 07/29/2021, 11:40 AM

## 2021-07-29 NOTE — Op Note (Addendum)
[facility] Patient Name: Ashley [name] Procedure Date: 07/29/2021 11:41 AM MRN: [id number] Date of Birth: 02/06/1966 Attending MD: Maylon Peppers ,  CSN: [id number] Age: 56 Admit Type: Outpatient Procedure:                Upper GI endoscopy Indications:              Periumbilical abdominal pain Providers:                Maylon Peppers, Charlsie Quest. Theda Sers RN, RN, Raphael Gibney, Technician Referring MD:              Medicines:                Monitored Anesthesia Care Complications:            No immediate complications. Estimated Blood Loss:     Estimated blood loss: none. Procedure:                Pre-Anesthesia Assessment:                           - Prior to the procedure, a History and Physical                            was performed, and patient medications, allergies                            and sensitivities were reviewed. The patient's                            tolerance of previous anesthesia was reviewed.                           - The risks and benefits of the procedure and the                            sedation options and risks were discussed with the                            patient. All questions were answered and informed                            consent was obtained.                           - ASA Grade Assessment: II - A patient with mild                            systemic disease.                           After obtaining informed consent, the endoscope was                            passed under direct vision. Throughout the  procedure, the patient's blood pressure, pulse, and                            oxygen saturations were monitored continuously. The                            GIF-H190 ([id number]) scope was introduced through the                            mouth, and advanced to the second part of duodenum.                            The upper GI endoscopy was accomplished without                             difficulty. The patient tolerated the procedure                            well. Scope In: 45:80:99 AM Scope Out: 11:54:35 AM Total Procedure Duration: 0 hours 7 minutes 36 seconds  Findings:      A 1 cm hiatal hernia was present.      The entire examined stomach was normal. Biopsies were taken with a cold       forceps for Helicobacter pylori testing.      Localized mild inflammation characterized by congestion (edema) was       found in the duodenal bulb. Impression:               - 1 cm hiatal hernia.                           - Normal stomach. Biopsied.                           - Duodenitis. Moderate Sedation:      Per Anesthesia Care Recommendation:           - Discharge patient to home (ambulatory).                           - Resume previous diet.                           - Await pathology results.                           - Continue present medications. Procedure Code(s):        --- Professional ---                           [phone], Esophagogastroduodenoscopy, flexible,                            transoral; with biopsy, single or multiple Diagnosis Code(s):        --- Professional ---                           K44.9, Diaphragmatic hernia without  obstruction or                            gangrene                           K29.80, Duodenitis without bleeding                           B90.38, Periumbilical pain CPT copyright 2019 [employer]. All rights reserved. The codes documented in this report are preliminary and upon coder review may  be revised to meet current compliance requirements. Maylon Peppers, MD Maylon Peppers,  07/29/2021 11:59:01 AM This report has been signed electronically. Number of Addenda: 0

## 2021-07-29 NOTE — Op Note (Signed)
[facility] Patient Name: Ashley [name] Procedure Date: 07/29/2021 11:40 AM MRN: [id number] Date of Birth: 03-Sep-1965 Attending MD: Maylon Peppers ,  CSN: [id number] Age: 56 Admit Type: Outpatient Procedure:                Colonoscopy Indications:              Periumbilical abdominal pain Providers:                Maylon Peppers, Charlsie Quest. Theda Sers RN, RN, Raphael Gibney, Technician Referring MD:              Medicines:                Monitored Anesthesia Care Complications:            No immediate complications. Estimated Blood Loss:     Estimated blood loss: none. Procedure:                Pre-Anesthesia Assessment:                           - Prior to the procedure, a History and Physical                            was performed, and patient medications, allergies                            and sensitivities were reviewed. The patient's                            tolerance of previous anesthesia was reviewed.                           - The risks and benefits of the procedure and the                            sedation options and risks were discussed with the                            patient. All questions were answered and informed                            consent was obtained.                           - ASA Grade Assessment: II - A patient with mild                            systemic disease.                           After obtaining informed consent, the colonoscope                            was passed under direct vision. Throughout the  procedure, the patient's blood pressure, pulse, and                            oxygen saturations were monitored continuously. The                            [id number]-HQ190L ([id number]) scope was introduced through                            the anus and advanced to the the cecum, identified                            by appendiceal orifice and ileocecal valve. The                             colonoscopy was performed without difficulty. The                            patient tolerated the procedure well. The quality                            of the bowel preparation was good. Scope In: 12:00:01 PM Scope Out: 12:24:13 PM Scope Withdrawal Time: 0 hours 19 minutes 52 seconds  Total Procedure Duration: 0 hours 24 minutes 12 seconds  Findings:      Skin tags were found on perianal exam.      Two sessile polyps were found in the ascending colon. The polyps were 3       to 5 mm in size. These polyps were removed with a cold snare. Resection       and retrieval were complete.      A 3 mm polyp was found in the sigmoid colon. The polyp was sessile. The       polyp was removed with a cold snare. Resection was complete, but the       polyp tissue was not retrieved.      Non-bleeding internal hemorrhoids were found during retroflexion. The       hemorrhoids were small. Impression:               - Perianal skin tags found on perianal exam.                           - Two 3 to 5 mm polyps in the ascending colon,                            removed with a cold snare. Resected and retrieved.                           - One 3 mm polyp in the sigmoid colon, removed with                            a cold snare. Complete resection. Polyp tissue not                            retrieved.                           -  Non-bleeding internal hemorrhoids. Moderate Sedation:      Per Anesthesia Care Recommendation:           - Discharge patient to home (ambulatory).                           - Resume previous diet.                           - Await pathology results.                           - Repeat colonoscopy in 5 years for surveillance. Procedure Code(s):        --- Professional ---                           [phone], Colonoscopy, flexible; with removal of                            tumor(s), polyp(s), or other lesion(s) by snare                            technique Diagnosis Code(s):         --- Professional ---                           K63.5, Polyp of colon                           K64.4, Residual hemorrhoidal skin tags                           K64.8, Other hemorrhoids                           U54.27, Periumbilical pain CPT copyright 2019 [employer]. All rights reserved. The codes documented in this report are preliminary and upon coder review may  be revised to meet current compliance requirements. Maylon Peppers, MD Maylon Peppers,  07/29/2021 12:30:12 PM This report has been signed electronically. Number of Addenda: 0

## 2021-07-29 NOTE — Discharge Instructions (Addendum)
You are being discharged to home.  Resume your previous diet.  We are waiting for your pathology results.  Continue your present medications.  Your physician has recommended a repeat colonoscopy in five years for surveillance.  

## 2021-07-29 NOTE — Anesthesia Preprocedure Evaluation (Signed)
Anesthesia Evaluation  Patient identified by MRN, date of birth, ID band Patient awake    Reviewed: Allergy & Precautions, H&P , NPO status , Patient's Chart, lab work & pertinent test results, reviewed documented beta blocker date and time   Airway Mallampati: II  TM Distance: >3 FB Neck ROM: full    Dental no notable dental hx.    Pulmonary asthma , COPD, Current Smoker,    Pulmonary exam normal breath sounds clear to auscultation       Cardiovascular Exercise Tolerance: Good hypertension, negative cardio ROS   Rhythm:regular Rate:Normal     Neuro/Psych PSYCHIATRIC DISORDERS Anxiety Depression Bipolar Disorder Schizophrenia negative neurological ROS     GI/Hepatic GERD  Medicated,(+) Hepatitis -, C  Endo/Other  diabetes, Type 2Hypothyroidism   Renal/GU negative Renal ROS  negative genitourinary   Musculoskeletal   Abdominal   Peds  Hematology  (+) HIV,   Anesthesia Other Findings   Reproductive/Obstetrics negative OB ROS                             Anesthesia Physical Anesthesia Plan  ASA: 3  Anesthesia Plan: General   Post-op Pain Management:    Induction:   PONV Risk Score and Plan: Propofol infusion  Airway Management Planned:   Additional Equipment:   Intra-op Plan:   Post-operative Plan:   Informed Consent: I have reviewed the patients History and Physical, chart, labs and discussed the procedure including the risks, benefits and alternatives for the proposed anesthesia with the patient or authorized representative who has indicated his/her understanding and acceptance.     Dental Advisory Given  Plan Discussed with: CRNA  Anesthesia Plan Comments:         Anesthesia Quick Evaluation

## 2021-07-30 NOTE — Anesthesia Postprocedure Evaluation (Signed)
Anesthesia Post Note  Patient: Ashley [name]  Procedure(s) Performed: COLONOSCOPY WITH PROPOFOL ESOPHAGOGASTRODUODENOSCOPY (EGD) WITH PROPOFOL BIOPSY  Patient location during evaluation: Phase II Anesthesia Type: General Level of consciousness: awake Pain management: pain level controlled Vital Signs Assessment: post-procedure vital signs reviewed and stable Respiratory status: spontaneous breathing and respiratory function stable Cardiovascular status: blood pressure returned to baseline and stable Postop Assessment: no headache and no apparent nausea or vomiting Anesthetic complications: no Comments: Late entry   No notable events documented.   Last Vitals:  Vitals:   07/29/21 1108 07/29/21 1230  BP: (!) 141/78 109/63  Pulse: 78 80  Resp: 19 18  Temp: 36.6 C (!) 36.4 C  SpO2: 99% 100%    Last Pain:  Vitals:   07/29/21 1237  TempSrc:   PainSc: 0-No pain                 Louann Sjogren

## 2021-08-01 ENCOUNTER — Encounter (INDEPENDENT_AMBULATORY_CARE_PROVIDER_SITE_OTHER): Payer: Self-pay | Admitting: *Deleted

## 2021-08-02 LAB — SURGICAL PATHOLOGY

## 2021-08-08 ENCOUNTER — Ambulatory Visit (INDEPENDENT_AMBULATORY_CARE_PROVIDER_SITE_OTHER): Payer: Medicaid Other | Admitting: Family Medicine

## 2021-08-08 ENCOUNTER — Encounter: Payer: Self-pay | Admitting: Family Medicine

## 2021-08-08 VITALS — BP 131/81 | HR 77 | Temp 97.6°F | Ht <= 58 in | Wt 118.6 lb

## 2021-08-08 DIAGNOSIS — E1159 Type 2 diabetes mellitus with other circulatory complications: Secondary | ICD-10-CM

## 2021-08-08 DIAGNOSIS — F4321 Adjustment disorder with depressed mood: Secondary | ICD-10-CM | POA: Diagnosis not present

## 2021-08-08 DIAGNOSIS — I152 Hypertension secondary to endocrine disorders: Secondary | ICD-10-CM | POA: Diagnosis not present

## 2021-08-08 NOTE — Progress Notes (Signed)
Subjective: CC: BP follow-up PCP: Janora Norlander, DO ZCH:YIFOY ANTASIA HAIDER is a 56 y.o. female presenting to clinic today for:  1.  Hypertension associated with type 2 diabetes Patient was seen on Jul 05, 2021 and blood pressure was noted to be elevated.  Patient is here for repeat blood pressure check today after being started on Norvasc 2.5 mg daily last visit.  She reports that she is doing well.  No chest pain, shortness of breath, headache or lower extremity edema.  She does report that her mood has been a little bit worse because of stressors related to passing of a grandchild.  Apparently at the grandchild is her son's daughter and died of accidental overdose at her mother's.  This apparently is still under investigation.  ROS: Per HPI  Allergies  Allergen Reactions   Aspirin Other (See Comments)    Liver problems   Roxicodone [Oxycodone Hcl] Itching   Tomato Other (See Comments)    Acid reflux.   Past Medical History:  Diagnosis Date   Ankle fracture    Anxiety    Asthma    Collagen vascular disease (HCC)    COPD (chronic obstructive pulmonary disease) ([place])    Depression    Diabetes mellitus    Elevated LFTs 01/13/2012   GERD (gastroesophageal reflux disease)    Hepatitis C    Hernia    HIV (human immunodeficiency virus infection) ([place])    HIV (human immunodeficiency virus infection) ([place])    Hypertension    Hypothyroidism    Pinched nerve    Scoliosis    TB (tuberculosis)     Current Outpatient Medications:    acetaminophen (TYLENOL) 500 MG tablet, Take 500 mg by mouth every 6 (six) hours as needed (pain.)., Disp: , Rfl:    amLODipine (NORVASC) 2.5 MG tablet, Take 1 tablet (2.5 mg total) by mouth daily. For blood pressure (Patient taking differently: Take 2.5 mg by mouth at bedtime. For blood pressure), Disp: 90 tablet, Rfl: 3   bictegravir-emtricitabine-tenofovir AF (BIKTARVY) 50-200-25 MG TABS tablet, Take 1 tablet by mouth daily. (Patient taking  differently: Take 1 tablet by mouth every evening.), Disp: 30 tablet, Rfl: 11   diclofenac Sodium (VOLTAREN) 1 % GEL, Apply 4 g topically 4 (four) times daily as needed (knee pain/ arthritis)., Disp: 300 g, Rfl: 2   FLUoxetine (PROZAC) 20 MG capsule, Take 1 capsule (20 mg total) by mouth daily. (Patient taking differently: Take 20 mg by mouth at bedtime.), Disp: 90 capsule, Rfl: 3   loratadine (CLARITIN) 10 MG tablet, Take 1 tablet (10 mg total) by mouth daily., Disp: 90 tablet, Rfl: 2   mometasone (NASONEX) 50 MCG/ACT nasal spray, Place 2 sprays into the nose daily. (Patient taking differently: Place 2 sprays into the nose daily as needed (allergies.).), Disp: 1 each, Rfl: 12   Multiple Vitamin (TAB-A-VITE) TABS, TAKE ONE TABLET BY MOUTH DAILY (Patient taking differently: Take 1 tablet by mouth at bedtime.), Disp: 90 tablet, Rfl: 1   ondansetron (ZOFRAN-ODT) 4 MG disintegrating tablet, '4mg'$  ODT q4 hours prn nausea/vomit, Disp: 12 tablet, Rfl: 0   pantoprazole (PROTONIX) 40 MG tablet, TAKE ONE TABLET BY MOUTH TWICE DAILY BEFORE MEALS, Disp: 60 tablet, Rfl: 5   sucralfate (CARAFATE) 1 GM/10ML suspension, Take 10 mLs (1 g total) by mouth 4 (four) times daily -  with meals and at bedtime. (Patient taking differently: Take 1 g by mouth 4 (four) times daily as needed (upset stomach).), Disp: 420 mL, Rfl: 0  tiZANidine (ZANAFLEX) 4 MG tablet, Take 0.5-1 tablets (2-4 mg total) by mouth every 8 (eight) hours as needed for muscle spasms (USE SPARINGLY.)., Disp: 30 tablet, Rfl: 5   VENTOLIN HFA 108 (90 Base) MCG/ACT inhaler, INHALE 2 PUFFS INTO THE LUNGS EVERY 6 HOURS AS NEEDED FOR WHEEZING OR SHORTNESS OF BREATH, Disp: 18 g, Rfl: 5 Social History   Socioeconomic History   Marital status: Single    Spouse name: Not on file   Number of children: 2   Years of education: Not on file   Highest education level: Not on file  Occupational History   Occupation: Disability  Tobacco Use   Smoking status: Every Day     Packs/day: 1.00    Years: 35.00    Total pack years: 35.00    Types: Cigarettes   Smokeless tobacco: Never   Tobacco comments:    1 pack every 2-3 days  Vaping Use   Vaping Use: Some days  Substance and Sexual Activity   Alcohol use: Not Currently    Alcohol/week: 3.0 standard drinks of alcohol    Types: 3 Cans of beer per week   Drug use: Yes    Types: Marijuana    Comment: last cocaine was 07/15/2017.   Sexual activity: Not Currently    Birth control/protection: Condom, None    Comment: patient given condoms  Other Topics Concern   Not on file  Social History Narrative   Pt lives in an apartment by herself.  Pt stated that she is married and that her husband is in a [employer], and that husband is being released yesterday or today.  Asked several times who Pt's psychiatric provider is.   Social Determinants of Health   Financial Resource Strain: Not on file  Food Insecurity: Not on file  Transportation Needs: Not on file  Physical Activity: Not on file  Stress: Not on file  Social Connections: Not on file  Intimate Partner Violence: Not on file   Family History  Problem Relation Age of Onset   Diabetes Mother    Hypertension Mother    Stroke Mother        died September 16, 2013  Cancer Sister    Cancer Maternal Aunt     Objective: Office vital signs reviewed. BP 131/81   Pulse 77   Temp 97.6 F (36.4 C)   Ht 4' 7.5" (1.41 m)   Wt 118 lb 9.6 oz (53.8 kg)   SpO2 97%   BMI 27.07 kg/m   Physical Examination:  General: Awake, alert, nontoxic female, No acute distress HEENT: sclera white, MMM Cardio: regular rate and rhythm, S1S2 heard, no murmurs appreciated Pulm: clear to auscultation bilaterally, no wheezes, rhonchi or rales; normal work of breathing on room air    08/08/2021   10:32 AM 07/05/2021   10:31 AM 06/24/2021   11:08 AM  Depression screen PHQ 2/9  Decreased Interest 2 0 0  Down, Depressed, Hopeless 3 2 0  PHQ - 2 Score 5 2 0  Altered  sleeping 2 2   Tired, decreased energy 2 2   Change in appetite 0 3   Feeling bad or failure about yourself  2 3   Trouble concentrating 3 1   Moving slowly or fidgety/restless 2 0   Suicidal thoughts 1 0   PHQ-9 Score 17 13   Difficult doing work/chores Somewhat difficult Somewhat difficult       08/08/2021   10:32 AM 07/05/2021   10:32  AM 05/17/2020    3:11 PM 08/22/2017    6:09 PM  GAD 7 : Generalized Anxiety Score  Nervous, Anxious, on Edge '2 1 1 2  '$ Control/stop worrying '2 2 2 2  '$ Worry too much - different things '2 2 2 2  '$ Trouble relaxing '2 1 2 2  '$ Restless '2 1 1 1  '$ Easily annoyed or irritable '2 1 2 3  '$ Afraid - awful might happen '2 1 1 2  '$ Total GAD 7 Score '14 9 11 14  '$ Anxiety Difficulty Somewhat difficult Somewhat difficult Somewhat difficult Somewhat difficult   Assessment/ Plan: 56 y.o. female   Hypertension associated with diabetes ([place])  Grief reaction  Blood pressure now well controlled.  No changes  She has situational exacerbation of her underlying depression and anxiety.  She is grieving the loss of her grandchild.  We discussed that if this does not start getting better we can advance her medications to 40 mg daily of the Prozac and or coordinate maybe some counseling for her.  She will let me know if this is needed  No orders of the defined types were placed in this encounter.  No orders of the defined types were placed in this encounter.    Janora Norlander, DO [place] [phone]

## 2021-08-25 ENCOUNTER — Encounter (INDEPENDENT_AMBULATORY_CARE_PROVIDER_SITE_OTHER): Payer: Self-pay | Admitting: Gastroenterology

## 2021-08-25 ENCOUNTER — Ambulatory Visit (INDEPENDENT_AMBULATORY_CARE_PROVIDER_SITE_OTHER): Payer: Medicaid Other | Admitting: Gastroenterology

## 2021-09-05 ENCOUNTER — Ambulatory Visit (INDEPENDENT_AMBULATORY_CARE_PROVIDER_SITE_OTHER): Payer: Medicaid Other | Admitting: Gastroenterology

## 2021-09-05 ENCOUNTER — Telehealth: Payer: Self-pay | Admitting: Family Medicine

## 2021-09-05 ENCOUNTER — Encounter (INDEPENDENT_AMBULATORY_CARE_PROVIDER_SITE_OTHER): Payer: Self-pay | Admitting: Gastroenterology

## 2021-09-05 ENCOUNTER — Telehealth: Payer: Self-pay

## 2021-09-05 VITALS — BP 138/85 | HR 75 | Temp 98.8°F | Ht <= 58 in | Wt 114.8 lb

## 2021-09-05 DIAGNOSIS — K2980 Duodenitis without bleeding: Secondary | ICD-10-CM | POA: Insufficient documentation

## 2021-09-05 DIAGNOSIS — R1010 Upper abdominal pain, unspecified: Secondary | ICD-10-CM

## 2021-09-05 DIAGNOSIS — R112 Nausea with vomiting, unspecified: Secondary | ICD-10-CM

## 2021-09-05 MED ORDER — SUCRALFATE 1 GM/10ML PO SUSP: 1.0000 g | Freq: Three times a day (TID) | ORAL | 1 refills | Status: DC

## 2021-09-05 MED ORDER — OMEPRAZOLE 40 MG PO CPDR: 40.0000 mg | DELAYED_RELEASE_CAPSULE | Freq: Two times a day (BID) | ORAL | 3 refills | Status: DC

## 2021-09-05 MED ORDER — SUCRALFATE 1 GM/10ML PO SUSP
1.0000 g | Freq: Three times a day (TID) | ORAL | 1 refills | Status: DC
Start: 2021-09-05 — End: 2021-10-11

## 2021-09-05 NOTE — Telephone Encounter (Signed)
Pt called in today c/o neck tightness and fingers tingling.  Annica in clerical transferred pt to nurse. When picked up there was a lot of commotion in the back ground and laughing. Said pts name multiple times with no response. Hung up and called pt back. No answer but left vmail advising pt to call back for an appointment to be evaluated or going to the ER based off of the symptoms she is having.

## 2021-09-05 NOTE — Telephone Encounter (Signed)
Patient returning call. Please call back

## 2021-09-05 NOTE — Telephone Encounter (Signed)
Attempted to contact - NA 

## 2021-09-05 NOTE — Progress Notes (Signed)
Referring Provider: Janora Norlander, DO Primary Care Physician:  Janora Norlander, DO Primary GI Physician:   Chief Complaint  Patient presents with   Abdominal Pain    Follow up on abdominal pain. Taking carafate as needed. States she is about the same.     HPI:   Ashley [name] is a 56 y.o. female with past medical history of HIV on Biktarvy, asthma, COPD, diabetes, collagen vascular disease, hypothyroidism, hepatitis C (achieved SVR), depression  Patient presenting today for follow up of abdominal pain.  History: Last seen Jun 27, 2021, at that time having diffuse abdominal pain, described as a Knot/cramping episodes intermittently. Pain began in April. Also had some diarrhea previously. Endorsed poor appetite and frequent vomiting previously. due to the severity of the pain the patient was seen at the ER at [facility] on 06/21/2021.  Labs showed mildly elevated AST of 57, ALT of 28, lipase 26, total bilirubin 1.0, alkaline phosphatase 58, creatinine 1.06, potassium low 3.2 rest of electrolytes within normal limits, CBC with although cell count of 6.5, hemoglobin 13.2 and platelets 205.  CT of the abdomen and pelvis with IV contrast at that time showed prominence of mucosal folds suggestive of possible gastritis, there was also presence of thickening in the colon (worse in the right colon) likely due to incomplete distention or inflammation. prescribed carafate and reported this helped with the pain and diarrhea. also had been taking Protonix 40 mg twice a day.  she was recommended to undergo EGD and Colonoscopy, continue carafate and start bentyl 1 tablet Q12h. Endoscopic findings as outlined below.    Present:  Patient reports that she is still having nausea and epigastric pain. She is still taking PPI twice a day. She is almost out of the carafate. She is not drinking or taking any NSAIDs. She feels that appetite is better now than it was, she is doing ensure shakes a few  times per day. Weight is stable. She is using carafate as needed for nausea. Sometimes eating worsens her nausea, still having epigastric pain as well that is pretty constant. She is having vomiting, usually once a day but improved from previously where she was vomiting almost all day long. She denies any rectal bleeding or melena. She continues to have some bloating at times. She notes that she has a compression band she has worn before that actually helps her abdominal pain. Has some occasional heartburn/acid regurgitation, she notes that she had this occur yesterday prior to taking her PPI but eased off after she took it. She is taking PPI around 11-12 in the morning and again prior to bed.   Last Colonoscopy:07/29/21- Perianal skin tags found on perianal exam. - Two 3 to 5 mm polyps in the ascending colon (tubular adenoma x2)  - One 3 mm polyp in the sigmoid colon- Non-bleeding internal hemorrhoids Last Endoscopy:07/29/21- 1 cm hiatal hernia. - Normal stomach. Biopsied-neg for H pylori - Duodenitis.  Recommendations:  Repeat colonoscopy in June 2026  Past Medical History:  Diagnosis Date   Ankle fracture    Anxiety    Asthma    Collagen vascular disease (HCC)    COPD (chronic obstructive pulmonary disease) ([place])    Depression    Diabetes mellitus    Elevated LFTs 01/13/2012   GERD (gastroesophageal reflux disease)    Hepatitis C    Hernia    HIV (human immunodeficiency virus infection) ([place])    HIV (human immunodeficiency virus infection) ([place])  Hypertension    Hypothyroidism    Pinched nerve    Scoliosis    TB (tuberculosis)     Past Surgical History:  Procedure Laterality Date   BIOPSY  05/08/2018   Procedure: biopsy;  Surgeon: Rogene Houston, MD;  [facility]: [facility];  Service: Endoscopy;;  proximal transverse colon polyp   BIOPSY  10/22/2019   Procedure: BIOPSY;  Surgeon: Rogene Houston, MD;  [facility]: [facility];  Service: Endoscopy;;   BIOPSY  07/29/2021    Procedure: BIOPSY;  Surgeon: Harvel Quale, MD;  [facility]: [facility];  Service: Gastroenterology;;   CESAREAN SECTION     CHOLECYSTECTOMY     COLONOSCOPY N/A 05/08/2018   Procedure: COLONOSCOPY;  Surgeon: Rogene Houston, MD;  [facility]: [facility];  Service: Endoscopy;  Laterality: N/A;  2:00   COLONOSCOPY WITH PROPOFOL N/A 07/29/2021   Procedure: COLONOSCOPY WITH PROPOFOL;  Surgeon: Harvel Quale, MD;  [facility];  Service: Gastroenterology;  Laterality: N/A;  1245   ESOPHAGEAL DILATION  10/22/2019   Procedure: ESOPHAGEAL DILATION;  Surgeon: Rogene Houston, MD;  [facility];  Service: Endoscopy;;   ESOPHAGOGASTRODUODENOSCOPY (EGD) WITH PROPOFOL N/A 10/22/2019   Procedure: ESOPHAGOGASTRODUODENOSCOPY (EGD) WITH PROPOFOL;  Surgeon: Rogene Houston, MD;  [facility];  Service: Endoscopy;  Laterality: N/A;  1220   ESOPHAGOGASTRODUODENOSCOPY (EGD) WITH PROPOFOL N/A 07/29/2021   Procedure: ESOPHAGOGASTRODUODENOSCOPY (EGD) WITH PROPOFOL;  Surgeon: Harvel Quale, MD;  [facility];  Service: Gastroenterology;  Laterality: N/A;   HERNIA REPAIR     x2   INCISIONAL HERNIA REPAIR N/A 09/30/2018   Procedure: OPEN HERNIA REPAIR INCISIONAL WITH MESH;  Surgeon: Virl Cagey, MD;  Location: [facility];  Service: General;  Laterality: N/A;   LIVER BIOPSY      Current Outpatient Medications  Medication Sig Dispense Refill   acetaminophen (TYLENOL) 500 MG tablet Take 500 mg by mouth every 6 (six) hours as needed (pain.).     amLODipine (NORVASC) 2.5 MG tablet Take 1 tablet (2.5 mg total) by mouth daily. For blood pressure (Patient taking differently: Take 2.5 mg by mouth at bedtime. For blood pressure) 90 tablet 3   bictegravir-emtricitabine-tenofovir AF (BIKTARVY) 50-200-25 MG TABS tablet Take 1 tablet by mouth daily. (Patient taking differently: Take 1 tablet by mouth every evening.) 30 tablet 11   diclofenac Sodium  (VOLTAREN) 1 % GEL Apply 4 g topically 4 (four) times daily as needed (knee pain/ arthritis). 300 g 2   FLUoxetine (PROZAC) 20 MG capsule Take 1 capsule (20 mg total) by mouth daily. (Patient taking differently: Take 20 mg by mouth at bedtime.) 90 capsule 3   loratadine (CLARITIN) 10 MG tablet Take 1 tablet (10 mg total) by mouth daily. 90 tablet 2   mometasone (NASONEX) 50 MCG/ACT nasal spray Place 2 sprays into the nose daily. (Patient taking differently: Place 2 sprays into the nose daily as needed (allergies.).) 1 each 12   Multiple Vitamin (TAB-A-VITE) TABS TAKE ONE TABLET BY MOUTH DAILY (Patient taking differently: Take 1 tablet by mouth at bedtime.) 90 tablet 1   pantoprazole (PROTONIX) 40 MG tablet TAKE ONE TABLET BY MOUTH TWICE DAILY BEFORE MEALS 60 tablet 5   sucralfate (CARAFATE) 1 GM/10ML suspension Take 10 mLs (1 g total) by mouth 4 (four) times daily -  with meals and at bedtime. (Patient taking differently: Take 1 g by mouth 4 (four) times daily as needed (upset stomach).) 420  mL 0   tiZANidine (ZANAFLEX) 4 MG tablet Take 0.5-1 tablets (2-4 mg total) by mouth every 8 (eight) hours as needed for muscle spasms (USE SPARINGLY.). 30 tablet 5   VENTOLIN HFA 108 (90 Base) MCG/ACT inhaler INHALE 2 PUFFS INTO THE LUNGS EVERY 6 HOURS AS NEEDED FOR WHEEZING OR SHORTNESS OF BREATH 18 g 5   ondansetron (ZOFRAN-ODT) 4 MG disintegrating tablet '4mg'$  ODT q4 hours prn nausea/vomit (Patient not taking: Reported on 09/05/2021) 12 tablet 0   No current facility-administered medications for this visit.    Allergies as of 09/05/2021 - Review Complete 09/05/2021  Allergen Reaction Noted   Aspirin Other (See Comments) 02/04/2011   Tomato Other (See Comments) 09/22/2019    Family History  Problem Relation Age of Onset   Diabetes Mother    Hypertension Mother    Stroke Mother        died 09-19-13  Cancer Sister    Cancer Maternal Aunt     Social History   Socioeconomic History   Marital status:  Single    Spouse name: Not on file   Number of children: 2   Years of education: Not on file   Highest education level: Not on file  Occupational History   Occupation: Disability  Tobacco Use   Smoking status: Every Day    Packs/day: 1.00    Years: 35.00    Total pack years: 35.00    Types: Cigarettes    Passive exposure: Current   Smokeless tobacco: Never   Tobacco comments:    1 pack every 2-3 days  Vaping Use   Vaping Use: Some days  Substance and Sexual Activity   Alcohol use: Not Currently    Alcohol/week: 3.0 standard drinks of alcohol    Types: 3 Cans of beer per week   Drug use: Yes    Types: Marijuana    Comment: last cocaine was 07/15/2017.   Sexual activity: Not Currently    Birth control/protection: Condom, None    Comment: patient given condoms  Other Topics Concern   Not on file  Social History Narrative   Pt lives in an apartment by herself.  Pt stated that she is married and that her husband is in a [employer], and that husband is being released yesterday or today.  Asked several times who Pt's psychiatric provider is.   Social Determinants of Health   Financial Resource Strain: Not on file  Food Insecurity: Not on file  Transportation Needs: Not on file  Physical Activity: Not on file  Stress: Not on file  Social Connections: Not on file   Review of systems General: negative for malaise, night sweats, fever, chills, weight loss Neck: Negative for lumps, goiter, pain and significant neck swelling Resp: Negative for cough, wheezing, dyspnea at rest CV: Negative for chest pain, leg swelling, palpitations, orthopnea GI: denies melena, hematochezia,  diarrhea, constipation, dysphagia, odyonophagia, early satiety or unintentional weight loss. +nausea +vomiting +epigastric pain MSK: Negative for joint pain or swelling, back pain, and muscle pain. Derm: Negative for itching or rash Psych: Denies depression, anxiety, memory loss, confusion. No  homicidal or suicidal ideation.  Heme: Negative for prolonged bleeding, bruising easily, and swollen nodes. Endocrine: Negative for cold or heat intolerance, polyuria, polydipsia and goiter. Neuro: negative for tremor, gait imbalance, syncope and seizures. The remainder of the review of systems is noncontributory.  Physical Exam: BP 138/85 (BP Location: Left Arm, Patient Position: Sitting, Cuff Size: Normal)   Pulse 75  Temp 98.8 F (37.1 C) (Oral)   Ht 4' 7.5" (1.41 m)   Wt 114 lb 12.8 oz (52.1 kg)   BMI 26.20 kg/m  General:   Alert and oriented. No distress noted. Pleasant and cooperative.  Head:  Normocephalic and atraumatic. Eyes:  Conjuctiva clear without scleral icterus. Mouth:  Oral mucosa pink and moist. Good dentition. No lesions. Heart: Normal rate and rhythm, s1 and s2 heart sounds present.  Lungs: Clear lung sounds in all lobes. Respirations equal and unlabored. Abdomen:  +BS, soft,  non-distended. TTP of epigastric region No rebound or guarding. No HSM or masses noted. Derm: No palmar erythema or jaundice Msk:  Symmetrical without gross deformities. Normal posture. Extremities:  Without edema. Neurologic:  Alert and  oriented x4 Psych:  Alert and cooperative. Normal mood and affect.  Invalid input(s): "6 MONTHS"   ASSESSMENT: FARREN LANDA is a 56 y.o. female presenting today for follow up of abdominal pain, nausea and vomiting.  EGD and Colonoscopy in June with tubular adenomas, 1cm hiatal hernia and duodenitis. Patient continues to have some epigastric pain nausea and vomiting though symptoms have improved some. Appetite is improving. She is maintained on protonix '40mg'$  BID and using carafate 1g PRN for pain/nausea. She has some breakthrough heartburn/acid regurgitation at times. Suspect her discomfort and nausea are related to the duodenitis found on recent EGD. Will stop protonix and start omeprazole '40mg'$  BID. Refill of carafate 1g sent as well. She should continue  to avoid all NSAIDs and alcohol. No red flag symptoms. Patient denies melena, hematochezia, diarrhea, constipation, dysphagia, odyonophagia, early satiety or weight loss.    PLAN:  Rx omeprazole '40mg'$  BID  2. Refill of carafate 1g QID 3. Take PPI earlier in the morning  4. Continue to avoid alcohol and NSAIDs 5. Stop protonix  All questions were answered, patient verbalized understanding and is in agreement with plan as outlined above.  Follow Up: 3 months  [place] L. Alver Sorrow, MSN, APRN, AGNP-C Adult-Gerontology Nurse Practitioner [facility]

## 2021-09-05 NOTE — Telephone Encounter (Signed)
Left message to call back  

## 2021-09-05 NOTE — Patient Instructions (Signed)
Please stop your protonix I am sending Omeprazole '40mg'$  to be taken twice a day in place of protonix, try taking first dose around 7-8 am instead of waiting til 11-12, take second dose at bedtime I have sent refill of carafate liquid to be used up to 4 times per day as well Continue to avoid alcohol and NSAIDs (advil, aleve, naproxen, goody powder, ibuprofen).    Follow up 3 months

## 2021-09-05 NOTE — Telephone Encounter (Signed)
Patient has been experiencing episodes of dizziness since having a fall in May.  She would like to be seen and evaluated.  Appointment scheduled 09/06/21 with Ashley [name].

## 2021-09-05 NOTE — Telephone Encounter (Signed)
Patient is having neck tight, dizzy and has tingling in fingers. Please call back and advise.

## 2021-09-06 ENCOUNTER — Encounter: Payer: Self-pay | Admitting: Family

## 2021-09-06 ENCOUNTER — Ambulatory Visit (INDEPENDENT_AMBULATORY_CARE_PROVIDER_SITE_OTHER): Payer: Medicaid Other | Admitting: Family

## 2021-09-06 VITALS — BP 140/81 | HR 84 | Temp 97.9°F | Ht <= 58 in | Wt 116.4 lb

## 2021-09-06 DIAGNOSIS — M542 Cervicalgia: Secondary | ICD-10-CM

## 2021-09-06 DIAGNOSIS — Z87828 Personal history of other (healed) physical injury and trauma: Secondary | ICD-10-CM

## 2021-09-06 DIAGNOSIS — M5412 Radiculopathy, cervical region: Secondary | ICD-10-CM

## 2021-09-06 DIAGNOSIS — R42 Dizziness and giddiness: Secondary | ICD-10-CM

## 2021-09-06 DIAGNOSIS — H8110 Benign paroxysmal vertigo, unspecified ear: Secondary | ICD-10-CM

## 2021-09-06 MED ORDER — TIZANIDINE HCL 4 MG PO TABS: 2.0000 mg | ORAL_TABLET | Freq: Three times a day (TID) | ORAL | 5 refills | Status: DC | PRN

## 2021-09-06 MED ORDER — DICLOFENAC SODIUM 75 MG PO TBEC: 75.0000 mg | DELAYED_RELEASE_TABLET | Freq: Two times a day (BID) | ORAL | 0 refills | Status: DC

## 2021-09-06 MED ORDER — MECLIZINE HCL 25 MG PO TABS: 25.0000 mg | ORAL_TABLET | Freq: Three times a day (TID) | ORAL | 1 refills | Status: DC | PRN

## 2021-09-06 NOTE — Patient Instructions (Addendum)
Benign Positional Vertigo Vertigo is the feeling that you or your surroundings are moving when they are not. Benign positional vertigo is the most common form of vertigo. This is usually a harmless condition (benign). This condition is positional. This means that symptoms are triggered by certain movements and positions. This condition can be dangerous if it occurs while you are doing something that could cause harm to yourself or others. This includes activities such as driving or operating machinery. What are the causes? The inner ear has fluid-filled canals that help your brain sense movement and balance. When the fluid moves, the brain receives messages about your body's position. With benign positional vertigo, calcium crystals in the inner ear break free and disturb the inner ear area. This causes your brain to receive confusing messages about your body's position. What increases the risk? You are more likely to develop this condition if: You are a woman. You are 50 years of age or older. You have recently had a head injury. You have an inner ear disease. What are the signs or symptoms? Symptoms of this condition usually happen when you move your head or your eyes in different directions. Symptoms may start suddenly and usually last for less than a minute. They include: Loss of balance and falling. Feeling like you are spinning or moving. Feeling like your surroundings are spinning or moving. Nausea and vomiting. Blurred vision. Dizziness. Involuntary eye movement (nystagmus). Symptoms can be mild and cause only minor problems, or they can be severe and interfere with daily life. Episodes of benign positional vertigo may return (recur) over time. Symptoms may also improve over time. How is this diagnosed? This condition may be diagnosed based on: Your medical history. A physical exam of the head, neck, and ears. Positional tests to check for or stimulate vertigo. You may be asked to  turn your head and change positions, such as going from sitting to lying down. A health care provider will watch for symptoms of vertigo. You may be referred to a health care provider who specializes in ear, nose, and throat problems (ENT or otolaryngologist) or a provider who specializes in disorders of the nervous system (neurologist). How is this treated?  This condition may be treated in a session in which your health care provider moves your head in specific positions to help the displaced crystals in your inner ear move. Treatment for this condition may take several sessions. Surgery may be needed in severe cases, but this is rare. In some cases, benign positional vertigo may resolve on its own in 2-4 weeks. Follow these instructions at home: Safety Move slowly. Avoid sudden body or head movements or certain positions, as told by your health care provider. Avoid driving or operating machinery until your health care provider says it is safe. Avoid doing any tasks that would be dangerous to you or others if vertigo occurs. If you have trouble walking or keeping your balance, try using a cane for stability. If you feel dizzy or unstable, sit down right away. Return to your normal activities as told by your health care provider. Ask your health care provider what activities are safe for you. General instructions Take over-the-counter and prescription medicines only as told by your health care provider. Drink enough fluid to keep your urine pale yellow. Keep all follow-up visits. This is important. Contact a health care provider if: You have a fever. Your condition gets worse or you develop new symptoms. Your family or friends notice any behavioral changes.   You have nausea or vomiting that gets worse. You have numbness or a prickling and tingling sensation. Get help right away if you: Have difficulty speaking or moving. Are always dizzy or faint. Develop severe headaches. Have weakness in  your legs or arms. Have changes in your hearing or vision. Develop a stiff neck. Develop sensitivity to light. These symptoms may represent a serious problem that is an emergency. Do not wait to see if the symptoms will go away. Get medical help right away. Call your local emergency services (911 in the [place] Do not drive yourself to the hospital. Summary Vertigo is the feeling that you or your surroundings are moving when they are not. Benign positional vertigo is the most common form of vertigo. This condition is caused by calcium crystals in the inner ear that become displaced. This causes a disturbance in an area of the inner ear that helps your brain sense movement and balance. Symptoms include loss of balance and falling, feeling that you or your surroundings are moving, nausea and vomiting, and blurred vision. This condition can be diagnosed based on symptoms, a physical exam, and positional tests. Follow safety instructions as told by your health care provider and keep all follow-up visits. This is important. This information is not intended to replace advice given to you by your health care provider. Make sure you discuss any questions you have with your health care provider.  Cervical Radiculopathy  Cervical radiculopathy happens when a nerve in the neck (a cervical nerve) is pinched or bruised. This condition can happen because of an injury to the cervical spine (vertebrae) in the neck, or as part of the normal aging process. Pressure on the cervical nerves can cause pain or numbness that travels from the neck all the way down to the arm and fingers. This condition usually gets better with rest. Treatment may be needed if the condition does not improve. What are the causes? This condition may be caused by: A neck injury. A bulging (herniated) disk. Muscle spasms. Muscle tightness in the neck due to overuse. Arthritis. Breakdown or degeneration in the bones and joints of the spine  (spondylosis) due to aging. Bone spurs that may develop near the cervical nerves. What are the signs or symptoms? Symptoms of this condition include: Pain. The pain may travel from the neck to the arm and hand. The pain can be severe or irritating. It may get worse when you move your neck. Numbness or tingling in your arm or hand. Weakness in the affected arm and hand, in severe cases. How is this diagnosed? This condition may be diagnosed based on your symptoms, your medical history, and a physical exam. You may also have tests, including: X-rays. CT scan. MRI. Electromyogram (EMG). Nerve conduction tests. How is this treated? In many cases, treatment is not needed for this condition. With rest, the condition usually gets better over time. If treatment is needed, options may include: Wearing a soft neck collar (cervical collar) for short periods of time. Doing physical therapy to strengthen your neck muscles. Taking medicines. These may include NSAIDs, such as ibuprofen, or oral corticosteroids. Having spinal injections, in severe cases. Having surgery. This may be needed if other treatments do not help. Different types of surgery may be done depending on the cause of this condition. Follow these instructions at home: If you have a cervical collar: Wear it as told by your health care provider. Remove it only as told by your health care provider. Ask your  health care provider if you can remove the cervical collar for cleaning and bathing. If you are allowed to remove the collar for cleaning or bathing: Follow instructions from your health care provider about how to remove the collar safely. Clean the collar by wiping it with mild soap and water and drying it completely. Take out any removable pads in the collar every 1-2 days, and wash them by hand with soap and water. Let them air-dry completely before you put them back in the collar. Check your skin under the collar for irritation or  sores. If you see any, tell your health care provider. Managing pain     Take over-the-counter and prescription medicines only as told by your health care provider. If directed, put ice on the affected area. To do this: If you have a soft neck collar, remove it as told by your health care provider. Put ice in a plastic bag. Place a towel between your skin and the bag. Leave the ice on for 20 minutes, 2-3 times a day. Remove the ice if your skin turns bright red. This is very important. If you cannot feel pain, heat, or cold, you have a greater risk of damage to the area. If applying ice does not help, you can try using heat. Use the heat source that your health care provider recommends, such as a moist heat pack or a heating pad. Place a towel between your skin and the heat source. Leave the heat on for 20-30 minutes. Remove the heat if your skin turns bright red. This is especially important if you are unable to feel pain, heat, or cold. You have a greater risk of getting burned. Try a gentle neck and shoulder massage to help relieve symptoms. Activity Rest as needed. Return to your normal activities as told by your health care provider. Ask your health care provider what activities are safe for you. Do stretching and strengthening exercises as told by your health care provider or your physical therapist. You may have to avoid lifting. Ask your health care provider how much you can safely lift. General instructions Use a flat pillow when you sleep. Do not drive while wearing a cervical collar. If you do not have a cervical collar, ask your health care provider if it is safe to drive while your neck heals. Ask your health care provider if the medicine prescribed to you requires you to avoid driving or using machinery. Do not use any products that contain nicotine or tobacco. These products include cigarettes, chewing tobacco, and vaping devices, such as e-cigarettes. If you need help  quitting, ask your health care provider. Keep all follow-up visits. This is important. Contact a health care provider if: Your condition does not improve with treatment. Get help right away if: Your pain gets much worse and is not controlled with medicines. You have weakness or numbness in your hand, arm, face, or leg. You have a high fever. You have a stiff, rigid neck. You lose control of your bowels or your bladder (have incontinence). You have trouble with walking, balance, or speaking. Summary Cervical radiculopathy happens when a nerve in the neck is pinched or bruised. A nerve can get pinched from a bulging disk, arthritis, muscle spasms, or an injury to the neck. Symptoms include pain, tingling, or numbness radiating from the neck to the arm or hand. Weakness can also occur in severe cases. Treatment may include rest, wearing a cervical collar, and physical therapy. Medicines may be prescribed to  help with pain. In severe cases, injections or surgery may be needed. This information is not intended to replace advice given to you by your health care provider. Make sure you discuss any questions you have with your health care provider. Document Revised: 08/19/2020 Document Reviewed: 08/19/2020 Elsevier Patient Education  [place] Revised: 01/14/2020 Document Reviewed: 01/14/2020 Elsevier Patient Education  [place].

## 2021-09-06 NOTE — Progress Notes (Signed)
Subjective:    Patient ID: Ashley [name], female    DOB: 11/01/1965, 56 y.o.   MRN: [id number]  Chief Complaint  Patient presents with   Dizziness   Neck Pain   PT presents to the office today with recurrent dizziness. States she fell in May and hit her head. She negative CT head on 06/21/21.  Since May she had had dizziness on and off. That is worse when she bends her head.  Dizziness This is a recurrent problem. The current episode started more than 1 year ago. The problem occurs intermittently. Associated symptoms include nausea, neck pain, vertigo and vomiting. Pertinent negatives include no change in bowel habit, chest pain or visual change. The symptoms are aggravated by bending. She has tried rest for the symptoms. The treatment provided mild relief.  Neck Pain  This is a new problem. The current episode started 1 to 4 weeks ago. The problem occurs constantly. The problem has been gradually worsening. The pain is associated with a fall. The pain is present in the midline. The quality of the pain is described as aching. The pain is at a severity of 8/10. The pain is mild. The symptoms are aggravated by bending. Associated symptoms include tingling (left hand) and trouble swallowing. Pertinent negatives include no chest pain or visual change. She has tried bed rest and NSAIDs for the symptoms. The treatment provided mild relief.      Review of Systems  HENT:  Positive for trouble swallowing.   Cardiovascular:  Negative for chest pain.  Gastrointestinal:  Positive for nausea and vomiting. Negative for change in bowel habit.  Musculoskeletal:  Positive for neck pain.  Neurological:  Positive for dizziness, vertigo and tingling (left hand).  All other systems reviewed and are negative.      Objective:   Physical Exam Vitals reviewed.  Constitutional:      General: She is not in acute distress.    Appearance: She is well-developed.  HENT:     Head: Normocephalic and  atraumatic.     Right Ear: Tympanic membrane normal.     Left Ear: Tympanic membrane normal.  Eyes:     Pupils: Pupils are equal, round, and reactive to light.  Neck:     Thyroid: No thyromegaly.  Cardiovascular:     Rate and Rhythm: Normal rate and regular rhythm.     Heart sounds: Normal heart sounds. No murmur heard. Pulmonary:     Effort: Pulmonary effort is normal. No respiratory distress.     Breath sounds: Normal breath sounds. No wheezing.  Abdominal:     General: Bowel sounds are normal. There is no distension.     Palpations: Abdomen is soft.     Tenderness: There is no abdominal tenderness.  Musculoskeletal:        General: No tenderness. Normal range of motion.     Cervical back: Normal range of motion and neck supple.     Comments: Full ROM of neck  Skin:    General: Skin is warm and dry.  Neurological:     Mental Status: She is alert and oriented to person, place, and time.     Cranial Nerves: No cranial nerve deficit.     Deep Tendon Reflexes: Reflexes are normal and symmetric.     Comments: Dizziness when she moves head.  Psychiatric:        Behavior: Behavior normal.        Thought Content: Thought content normal.  Judgment: Judgment normal.     BP 140/81   Pulse 84   Temp 97.9 F (36.6 C) (Temporal)   Ht 4' 7.5" (1.41 m)   Wt 116 lb 6 oz (52.8 kg)   BMI 26.56 kg/m      Assessment & Plan:  Ashley [name] comes in today with chief complaint of Dizziness and Neck Pain   Diagnosis and orders addressed:  1. Neck pain Start diclofenac BID with food for 5-7 days  No other NSAIDs - diclofenac (VOLTAREN) 75 MG EC tablet; Take 1 tablet (75 mg total) by mouth 2 (two) times daily.  Dispense: 30 tablet; Refill: 0  2. Cervical radiculopathy Start diclofenac BID with food for 5-7 days  No other NSAIDs Zanaflex as needed ROM exercises encouraged  - diclofenac (VOLTAREN) 75 MG EC tablet; Take 1 tablet (75 mg total) by mouth 2 (two) times daily.   Dispense: 30 tablet; Refill: 0 - tiZANidine (ZANAFLEX) 4 MG tablet; Take 0.5-1 tablets (2-4 mg total) by mouth every 8 (eight) hours as needed for muscle spasms (USE SPARINGLY.).  Dispense: 30 tablet; Refill: 5  3. Dizziness E - meclizine (ANTIVERT) 25 MG tablet; Take 1 tablet (25 mg total) by mouth 3 (three) times daily as needed for dizziness.  Dispense: 90 tablet; Refill: 1  4. H/O head injury CT negative   5. Benign paroxysmal positional vertigo, unspecified laterality Antivert given as needed Epley exercises pdf given and discussed Fall precautions  - meclizine (ANTIVERT) 25 MG tablet; Take 1 tablet (25 mg total) by mouth 3 (three) times daily as needed for dizziness.  Dispense: 90 tablet; Refill: 1  Labs reviewed from 07/05/21 Health Maintenance reviewed Diet and exercise encouraged  Follow up plan: Keep follow up with PCP   Evelina Dun, FNP

## 2021-09-25 ENCOUNTER — Other Ambulatory Visit: Payer: Self-pay | Admitting: Family Medicine

## 2021-09-25 DIAGNOSIS — J439 Emphysema, unspecified: Secondary | ICD-10-CM

## 2021-09-29 ENCOUNTER — Other Ambulatory Visit: Payer: Self-pay

## 2021-09-29 ENCOUNTER — Other Ambulatory Visit: Payer: Medicaid Other

## 2021-09-29 DIAGNOSIS — B20 Human immunodeficiency virus [HIV] disease: Secondary | ICD-10-CM

## 2021-09-30 LAB — T-HELPER CELLS (CD4) COUNT (NOT AT ARMC)
CD4 % Helper T Cell: 39 % (ref 33–65)
CD4 T Cell Abs: 374 /uL — ABNORMAL LOW (ref 400–1790)

## 2021-10-03 LAB — HIV-1 RNA QUANT-NO REFLEX-BLD
HIV 1 RNA Quant: <20 Copies/mL — ABNORMAL HIGH
HIV-1 RNA Quant, Log: <1.3 Log cps/mL — ABNORMAL HIGH

## 2021-10-11 ENCOUNTER — Other Ambulatory Visit (INDEPENDENT_AMBULATORY_CARE_PROVIDER_SITE_OTHER): Payer: Self-pay | Admitting: Gastroenterology

## 2021-10-13 ENCOUNTER — Ambulatory Visit (INDEPENDENT_AMBULATORY_CARE_PROVIDER_SITE_OTHER): Payer: Medicaid Other | Admitting: Internal Medicine

## 2021-10-13 ENCOUNTER — Other Ambulatory Visit: Payer: Self-pay

## 2021-10-13 ENCOUNTER — Encounter: Payer: Self-pay | Admitting: Internal Medicine

## 2021-10-13 VITALS — BP 136/90 | HR 71 | Temp 97.9°F | Ht <= 58 in | Wt 122.0 lb

## 2021-10-13 DIAGNOSIS — Z720 Tobacco use: Secondary | ICD-10-CM | POA: Diagnosis not present

## 2021-10-13 DIAGNOSIS — Z113 Encounter for screening for infections with a predominantly sexual mode of transmission: Secondary | ICD-10-CM | POA: Diagnosis not present

## 2021-10-13 DIAGNOSIS — B20 Human immunodeficiency virus [HIV] disease: Secondary | ICD-10-CM

## 2021-10-13 NOTE — Assessment & Plan Note (Signed)
She continues doing well.  Labs reviewed with her and no concerns.  She will continue with Biktarvy.  Follow up in 6 months.

## 2021-10-13 NOTE — Progress Notes (Signed)
   Subjective:    Patient ID: Ashley [name], female    DOB: January 09, 1966, 56 y.o.   MRN: [id number]  HPI Here for follow up of HIV She continues on Biktarvy and denies any missed doses.  No new concerns.  No issues with getting, taking or tolerating her medication.     Review of Systems  Constitutional:  Negative for fatigue.  Gastrointestinal:  Negative for diarrhea.  Skin:  Negative for rash.       Objective:   Physical Exam HENT:     Head:     Comments: Some fluid behind TMs bilateral Pulmonary:     Effort: Pulmonary effort is normal.  Skin:    Findings: No rash.  Neurological:     Mental Status: She is alert.  Psychiatric:        Mood and Affect: Mood normal.     SH: + tobacco      Assessment & Plan:

## 2021-10-13 NOTE — Assessment & Plan Note (Signed)
Counseled on cessation 

## 2021-10-26 ENCOUNTER — Ambulatory Visit (INDEPENDENT_AMBULATORY_CARE_PROVIDER_SITE_OTHER): Payer: Medicaid Other

## 2021-10-26 ENCOUNTER — Encounter: Payer: Self-pay | Admitting: Family Medicine

## 2021-10-26 ENCOUNTER — Ambulatory Visit (INDEPENDENT_AMBULATORY_CARE_PROVIDER_SITE_OTHER): Payer: Medicaid Other | Admitting: Family Medicine

## 2021-10-26 VITALS — BP 132/82 | HR 78 | Temp 98.8°F | Ht <= 58 in | Wt 121.0 lb

## 2021-10-26 DIAGNOSIS — W19XXXA Unspecified fall, initial encounter: Secondary | ICD-10-CM

## 2021-10-26 DIAGNOSIS — M542 Cervicalgia: Secondary | ICD-10-CM | POA: Diagnosis not present

## 2021-10-26 NOTE — Progress Notes (Signed)
Subjective:  Patient ID: Ashley [name], female    DOB: 12-19-1965, 56 y.o.   MRN: [id number]  Patient Care Team: Janora Norlander, DO as PCP - General (Family Medicine)   Chief Complaint:  Fall   HPI: Ashley [name] is a 57 y.o. female presenting on 10/26/2021 for Fall   Pt states she tripped on her shoe during church on Sunday and hit her head on the pew. She denies LOC. She has history of dizziness which has not worsened since fall. She denies confusion, weakness, headaches, or focal neurological deficits. States she does have pain in her neck, worse with rotation and flexion of neck.   Fall The accident occurred 3 to 5 days ago. The fall occurred while walking. She landed on [place]. There was no blood loss. The point of impact was the head, left knee and right knee. The pain is present in the neck. The pain is at a severity of 5/10. The pain is moderate. The symptoms are aggravated by flexion, extension, movement and rotation. Pertinent negatives include no abdominal pain, bowel incontinence, fever, headaches, hearing loss, hematuria, loss of consciousness, nausea, numbness, tingling, visual change or vomiting. She has tried nothing for the symptoms. The treatment provided no relief.       Relevant past medical, surgical, family, and social history reviewed and updated as indicated.  Allergies and medications reviewed and updated. Data reviewed: Chart in Epic.   Past Medical History:  Diagnosis Date   Ankle fracture    Anxiety    Asthma    Collagen vascular disease (HCC)    COPD (chronic obstructive pulmonary disease) ([place])    Depression    Diabetes mellitus    Elevated LFTs 01/13/2012   GERD (gastroesophageal reflux disease)    Hepatitis C    Hernia    HIV (human immunodeficiency virus infection) ([place])    HIV (human immunodeficiency virus infection) ([place])    Hypertension    Hypothyroidism    Pinched nerve    Scoliosis    TB (tuberculosis)     Past Surgical  History:  Procedure Laterality Date   BIOPSY  05/08/2018   Procedure: biopsy;  Surgeon: Rogene Houston, MD;  [facility];  Service: Endoscopy;;  proximal transverse colon polyp   BIOPSY  10/22/2019   Procedure: BIOPSY;  Surgeon: Rogene Houston, MD;  [facility];  Service: Endoscopy;;   BIOPSY  07/29/2021   Procedure: BIOPSY;  Surgeon: Harvel Quale, MD;  [facility];  Service: Gastroenterology;;   CESAREAN SECTION     CHOLECYSTECTOMY     COLONOSCOPY N/A 05/08/2018   Procedure: COLONOSCOPY;  Surgeon: Rogene Houston, MD;  [facility];  Service: Endoscopy;  Laterality: N/A;  2:00   COLONOSCOPY WITH PROPOFOL N/A 07/29/2021   Procedure: COLONOSCOPY WITH PROPOFOL;  Surgeon: Harvel Quale, MD;  [facility];  Service: Gastroenterology;  Laterality: N/A;  1245   ESOPHAGEAL DILATION  10/22/2019   Procedure: ESOPHAGEAL DILATION;  Surgeon: Rogene Houston, MD;  [facility];  Service: Endoscopy;;   ESOPHAGOGASTRODUODENOSCOPY (EGD) WITH PROPOFOL N/A 10/22/2019   Procedure: ESOPHAGOGASTRODUODENOSCOPY (EGD) WITH PROPOFOL;  Surgeon: Rogene Houston, MD;  [facility];  Service: Endoscopy;  Laterality: N/A;  1220   ESOPHAGOGASTRODUODENOSCOPY (EGD) WITH PROPOFOL N/A 07/29/2021   Procedure: ESOPHAGOGASTRODUODENOSCOPY (EGD) WITH PROPOFOL;  Surgeon: Harvel Quale, MD;  [facility];  Service: Gastroenterology;  Laterality: N/A;   HERNIA REPAIR     x2   INCISIONAL HERNIA REPAIR N/A 09/30/2018   Procedure: OPEN HERNIA REPAIR INCISIONAL WITH MESH;  Surgeon: Virl Cagey, MD;  Location: [facility];  Service: General;  Laterality: N/A;   LIVER BIOPSY      Social History   Socioeconomic History   Marital status: Single    Spouse name: Not on file   Number of children: 2   Years of education: Not on file   Highest education level: Not on file  Occupational History   Occupation:  Disability  Tobacco Use   Smoking status: Every Day    Packs/day: 1.00    Years: 35.00    Total pack years: 35.00    Types: Cigarettes    Passive exposure: Current   Smokeless tobacco: Never   Tobacco comments:    1 pack every 2-3 days  Vaping Use   Vaping Use: Some days  Substance and Sexual Activity   Alcohol use: Not Currently    Alcohol/week: 3.0 standard drinks of alcohol    Types: 3 Cans of beer per week   Drug use: Yes    Types: Marijuana    Comment: last cocaine was 07/15/2017.   Sexual activity: Not Currently    Birth control/protection: Condom, None    Comment: patient given condoms  Other Topics Concern   Not on file  Social History Narrative   Pt lives in an apartment by herself.  Pt stated that she is married and that her husband is in a Corporate treasurer, and that husband is being released yesterday or today.  Asked several times who Pt's psychiatric provider is.   Social Determinants of Health   Financial Resource Strain: Not on file  Food Insecurity: Not on file  Transportation Needs: Not on file  Physical Activity: Not on file  Stress: Not on file  Social Connections: Not on file  Intimate Partner Violence: Not on file    Outpatient Encounter Medications as of 10/26/2021  Medication Sig   acetaminophen (TYLENOL) 500 MG tablet Take 500 mg by mouth every 6 (six) hours as needed (pain.).   amLODipine (NORVASC) 2.5 MG tablet Take 1 tablet (2.5 mg total) by mouth daily. For blood pressure (Patient taking differently: Take 2.5 mg by mouth at bedtime. For blood pressure)   bictegravir-emtricitabine-tenofovir AF (BIKTARVY) 50-200-25 MG TABS tablet Take 1 tablet by mouth daily. (Patient taking differently: Take 1 tablet by mouth every evening.)   diclofenac (VOLTAREN) 75 MG EC tablet Take 1 tablet (75 mg total) by mouth 2 (two) times daily.   diclofenac Sodium (VOLTAREN) 1 % GEL Apply 4 g topically 4 (four) times daily as needed (knee pain/ arthritis).    FLUoxetine (PROZAC) 20 MG capsule Take 1 capsule (20 mg total) by mouth daily. (Patient taking differently: Take 20 mg by mouth at bedtime.)   loratadine (CLARITIN) 10 MG tablet Take 1 tablet (10 mg total) by mouth daily.   meclizine (ANTIVERT) 25 MG tablet Take 1 tablet (25 mg total) by mouth 3 (three) times daily as needed for dizziness.   mometasone (NASONEX) 50 MCG/ACT nasal spray Place 2 sprays into the nose daily. (Patient taking differently: Place 2 sprays into the nose daily as needed (allergies.).)   Multiple Vitamin (TAB-A-VITE) TABS TAKE ONE TABLET BY MOUTH DAILY (Patient taking differently: Take 1 tablet by mouth at bedtime.)   omeprazole (PRILOSEC) 40 MG capsule Take 1 capsule (40 mg total) by mouth 2 (two)  times daily.   ondansetron (ZOFRAN-ODT) 4 MG disintegrating tablet '4mg'$  ODT q4 hours prn nausea/vomit   sucralfate (CARAFATE) 1 GM/10ML suspension TAKE 10 MLS BY MOUTH FOUR TIMES DAILY WITH MEALS AND AT BEDTIME   tiZANidine (ZANAFLEX) 4 MG tablet Take 0.5-1 tablets (2-4 mg total) by mouth every 8 (eight) hours as needed for muscle spasms (USE SPARINGLY.).   VENTOLIN HFA 108 (90 Base) MCG/ACT inhaler INHALE TWO PUFFS INTO THE LUNGS EVERY 6 HOURS AS NEEDED FOR WHEEZING OR SHORTNESS OF BREATH   No facility-administered encounter medications on file as of 10/26/2021.    Allergies  Allergen Reactions   Aspirin Other (See Comments)    Liver problems   Tomato Other (See Comments)    Acid reflux.    Review of Systems  Constitutional:  Negative for activity change, appetite change, chills, diaphoresis, fatigue, fever and unexpected weight change.  HENT: Negative.    Eyes: Negative.  Negative for photophobia and visual disturbance.  Respiratory:  Negative for cough, chest tightness and shortness of breath.   Cardiovascular:  Negative for chest pain, palpitations and leg swelling.  Gastrointestinal:  Negative for abdominal pain, blood in stool, bowel incontinence, constipation,  diarrhea, nausea and vomiting.  Endocrine: Negative.   Genitourinary:  Negative for decreased urine volume, difficulty urinating, dysuria, frequency, hematuria and urgency.  Musculoskeletal:  Positive for neck pain and neck stiffness. Negative for arthralgias, back pain, gait problem, joint swelling and myalgias.  Skin: Negative.   Allergic/Immunologic: Negative.   Neurological:  Negative for dizziness, tingling, tremors, seizures, loss of consciousness, syncope, facial asymmetry, speech difficulty, weakness, light-headedness, numbness and headaches.  Hematological: Negative.   Psychiatric/Behavioral:  Negative for confusion, hallucinations, sleep disturbance and suicidal ideas.   All other systems reviewed and are negative.       Objective:  BP 132/82   Pulse 78   Temp 98.8 F (37.1 C)   Ht '4\' 7"'$  (1.397 m)   Wt 121 lb (54.9 kg)   SpO2 100%   BMI 28.12 kg/m    Wt Readings from Last 3 Encounters:  10/26/21 121 lb (54.9 kg)  10/13/21 122 lb (55.3 kg)  09/06/21 116 lb 6 oz (52.8 kg)    Physical Exam Vitals and nursing note reviewed.  Constitutional:      General: She is not in acute distress.    Appearance: Normal appearance. She is well-developed and well-groomed. She is not ill-appearing, toxic-appearing or diaphoretic.  HENT:     Head: Normocephalic and atraumatic.     Jaw: There is normal jaw occlusion.     Right Ear: Hearing normal.     Left Ear: Hearing normal.     Nose: Nose normal.     Mouth/Throat:     Lips: Pink.     Mouth: Mucous membranes are moist.     Pharynx: Oropharynx is clear. Uvula midline.  Eyes:     General: Lids are normal.     Extraocular Movements: Extraocular movements intact.     Conjunctiva/sclera: Conjunctivae normal.     Pupils: Pupils are equal, round, and reactive to light.  Neck:     Thyroid: No thyroid mass, thyromegaly or thyroid tenderness.     Vascular: No carotid bruit or JVD.     Trachea: Trachea and phonation normal.      Comments: Neck ROM: pain with rotation, flexion, and extension Cardiovascular:     Rate and Rhythm: Normal rate and regular rhythm.     Chest Wall: PMI is not displaced.  Pulses: Normal pulses.     Heart sounds: Normal heart sounds. No murmur heard.    No friction rub. No gallop.  Pulmonary:     Effort: Pulmonary effort is normal. No respiratory distress.     Breath sounds: Normal breath sounds. No wheezing.  Abdominal:     General: Bowel sounds are normal. There is no distension or abdominal bruit.     Palpations: Abdomen is soft. There is no hepatomegaly or splenomegaly.     Tenderness: There is no abdominal tenderness. There is no right CVA tenderness or left CVA tenderness.     Hernia: No hernia is present.  Musculoskeletal:        General: Normal range of motion.     Cervical back: Normal range of motion and neck supple. No edema, erythema, signs of trauma, rigidity, torticollis or crepitus. Pain with movement and muscular tenderness present. No spinous process tenderness. Normal range of motion.     Right lower leg: No edema.     Left lower leg: No edema.  Lymphadenopathy:     Cervical: No cervical adenopathy.  Skin:    General: Skin is warm and dry.     Capillary Refill: Capillary refill takes less than 2 seconds.     Coloration: Skin is not cyanotic, jaundiced or pale.     Findings: No rash.  Neurological:     General: No focal deficit present.     Mental Status: She is alert and oriented to person, place, and time.     Cranial Nerves: No cranial nerve deficit.     Sensory: Sensation is intact. No sensory deficit.     Motor: Motor function is intact. No weakness.     Coordination: Coordination is intact. Coordination normal.     Gait: Gait is intact. Gait normal.     Deep Tendon Reflexes: Reflexes are normal and symmetric. Reflexes normal.  Psychiatric:        Attention and Perception: Attention and perception normal.        Mood and Affect: Mood and affect normal.         Speech: Speech normal.        Behavior: Behavior normal. Behavior is cooperative.        Thought Content: Thought content normal.        Cognition and Memory: Cognition and memory normal.        Judgment: Judgment normal.     Results for orders placed or performed in visit on 09/29/21  HIV-1 RNA quant-no reflex-bld  Result Value Ref Range   HIV 1 RNA Quant <20 (H) Copies/mL   HIV-1 RNA Quant, Log <1.30 (H) Log cps/mL  T-helper cells (CD4) count  Result Value Ref Range   CD4 T Cell Abs 374 (L) 400 - 1,790 /uL   CD4 % Helper T Cell 39 33 - 65 %     X-Ray: C-Spine: degenerative changes. No acute findings. Preliminary x-ray reading by Monia Pouch, FNP-C, [facility].   Pertinent labs & imaging results that were available during my care of the patient were reviewed by me and considered in my medical decision making.  Assessment & Plan:  Tniyah was seen today for fall.  Diagnoses and all orders for this visit:  Fall, initial encounter Neck pain C-spine without acute findings, will notify pt if radiology reading differs. Has voltaren at home, pt aware to take as prescribed. Symptomatic care discussed in detail. Red flags discussed in detail. Aware to report new, worsening, or  persistent symptoms. -     DG Cervical Spine Complete; Future     Continue all other maintenance medications.  Follow up plan: Return if symptoms worsen or fail to improve.   Continue healthy lifestyle choices, including diet (rich in fruits, vegetables, and lean proteins, and low in salt and simple carbohydrates) and exercise (at least 30 minutes of moderate physical activity daily).  Educational handout given for neck sprain  The above assessment and management plan was discussed with the patient. The patient verbalized understanding of and has agreed to the management plan. Patient is aware to call the clinic if they develop any new symptoms or if symptoms persist or worsen. Patient is aware when to  return to the clinic for a follow-up visit. Patient educated on when it is appropriate to go to the emergency department.   Monia Pouch, FNP-C [facility] [phone]

## 2021-10-26 NOTE — Patient Instructions (Signed)
Heat or ice to area for symptomatic relief. Continue current medications as prescribed. Report new or worsening symptoms.

## 2021-11-01 ENCOUNTER — Telehealth: Payer: Self-pay | Admitting: Family Medicine

## 2021-11-01 NOTE — Telephone Encounter (Signed)
Patient aware and verbalizes understanding. 

## 2021-11-01 NOTE — Telephone Encounter (Signed)
Patient seen Ashley [name] on 8/30- michele off so will send to PCP to advise.  No medication was sent in on 8/30- please review and advise

## 2021-11-01 NOTE — Telephone Encounter (Signed)
Note states pt has Voltaren gel. This is the cream for arthritis.  She is prescribed Tizanidine at baseline, this is the muscle relaxer (has several refills on it, call pharmacy.)  there is no mention of ear drops or heart burn meds. So I have no idea what michelle intended to send in.

## 2021-11-03 ENCOUNTER — Other Ambulatory Visit: Payer: Self-pay | Admitting: Family

## 2021-11-03 DIAGNOSIS — H8110 Benign paroxysmal vertigo, unspecified ear: Secondary | ICD-10-CM

## 2021-11-03 DIAGNOSIS — R42 Dizziness and giddiness: Secondary | ICD-10-CM

## 2021-11-07 NOTE — Telephone Encounter (Signed)
Pt upset and yelling about this message please call back

## 2021-11-07 NOTE — Telephone Encounter (Signed)
Attempted to contact - NA 

## 2021-11-08 NOTE — Telephone Encounter (Signed)
PT R/C 

## 2021-11-08 NOTE — Telephone Encounter (Signed)
Na acc 9.12

## 2021-11-09 ENCOUNTER — Other Ambulatory Visit: Payer: Self-pay | Admitting: Family

## 2021-11-09 DIAGNOSIS — H8110 Benign paroxysmal vertigo, unspecified ear: Secondary | ICD-10-CM

## 2021-11-09 DIAGNOSIS — R42 Dizziness and giddiness: Secondary | ICD-10-CM

## 2021-11-14 ENCOUNTER — Encounter: Payer: Self-pay | Admitting: Family Medicine

## 2021-11-14 ENCOUNTER — Ambulatory Visit (INDEPENDENT_AMBULATORY_CARE_PROVIDER_SITE_OTHER): Payer: Medicaid Other | Admitting: Family Medicine

## 2021-11-14 ENCOUNTER — Ambulatory Visit (INDEPENDENT_AMBULATORY_CARE_PROVIDER_SITE_OTHER): Payer: Medicaid Other

## 2021-11-14 VITALS — BP 150/86 | HR 85 | Temp 97.8°F | Ht <= 58 in | Wt 122.2 lb

## 2021-11-14 DIAGNOSIS — F410 Panic disorder [episodic paroxysmal anxiety] without agoraphobia: Secondary | ICD-10-CM

## 2021-11-14 DIAGNOSIS — J209 Acute bronchitis, unspecified: Secondary | ICD-10-CM | POA: Diagnosis not present

## 2021-11-14 DIAGNOSIS — J40 Bronchitis, not specified as acute or chronic: Secondary | ICD-10-CM

## 2021-11-14 DIAGNOSIS — F411 Generalized anxiety disorder: Secondary | ICD-10-CM

## 2021-11-14 DIAGNOSIS — B20 Human immunodeficiency virus [HIV] disease: Secondary | ICD-10-CM

## 2021-11-14 DIAGNOSIS — F4321 Adjustment disorder with depressed mood: Secondary | ICD-10-CM | POA: Diagnosis not present

## 2021-11-14 MED ORDER — FLUOXETINE HCL 40 MG PO CAPS: 40.0000 mg | ORAL_CAPSULE | Freq: Every day | ORAL | 0 refills | Status: DC

## 2021-11-14 MED ORDER — METHYLPREDNISOLONE ACETATE 40 MG/ML IJ SUSP
40.0000 mg | Freq: Once | INTRAMUSCULAR | Status: AC
  Administered 2021-11-14: 40 mg via INTRAMUSCULAR

## 2021-11-14 MED ORDER — GUAIFENESIN-CODEINE 100-10 MG/5ML PO SYRP: 5.0000 mL | ORAL_SOLUTION | Freq: Three times a day (TID) | ORAL | 0 refills | Status: DC | PRN

## 2021-11-14 MED ORDER — HYDROXYZINE PAMOATE 25 MG PO CAPS: 25.0000 mg | ORAL_CAPSULE | Freq: Three times a day (TID) | ORAL | 0 refills | Status: DC | PRN

## 2021-11-14 MED ORDER — PREDNISONE 10 MG (21) PO TBPK: ORAL_TABLET | ORAL | 0 refills | Status: DC

## 2021-11-14 NOTE — Progress Notes (Signed)
Subjective: CC:f/u grief/ anxiety PCP: Janora Norlander, DO DXI:Ashley [name] is a 56 y.o. female presenting to clinic today for:  1. Grief/ anxiety She reports ongoing anxiety and panic despite use of Prozac 20 mg daily.  This has been exacerbated since the passing of her family member.  She still does not know the exact details of that passing but it is an apparent overdose.  2.  Cough Patient reports a 3-day history of cough, congestion and subjective fevers.  She had some chills on Saturday and Sunday as well.  She was exposed to COVID 3 weeks ago.  No reports of nausea, vomiting or diarrhea.  She used her albuterol inhaler this morning with some relief of symptoms.  Using a nasal spray as well.   ROS: Per HPI  Allergies  Allergen Reactions   Aspirin Other (See Comments)    Liver problems   Tomato Other (See Comments)    Acid reflux.   Past Medical History:  Diagnosis Date   Ankle fracture    Anxiety    Asthma    Collagen vascular disease (HCC)    COPD (chronic obstructive pulmonary disease) ([place])    Depression    Diabetes mellitus    Elevated LFTs 01/13/2012   GERD (gastroesophageal reflux disease)    Hepatitis C    Hernia    HIV (human immunodeficiency virus infection) ([place])    HIV (human immunodeficiency virus infection) ([place])    Hypertension    Hypothyroidism    Pinched nerve    Scoliosis    TB (tuberculosis)     Current Outpatient Medications:    acetaminophen (TYLENOL) 500 MG tablet, Take 500 mg by mouth every 6 (six) hours as needed (pain.)., Disp: , Rfl:    amLODipine (NORVASC) 2.5 MG tablet, Take 1 tablet (2.5 mg total) by mouth daily. For blood pressure (Patient taking differently: Take 2.5 mg by mouth at bedtime. For blood pressure), Disp: 90 tablet, Rfl: 3   bictegravir-emtricitabine-tenofovir AF (BIKTARVY) 50-200-25 MG TABS tablet, Take 1 tablet by mouth daily. (Patient taking differently: Take 1 tablet by mouth every evening.), Disp: 30 tablet,  Rfl: 11   diclofenac (VOLTAREN) 75 MG EC tablet, Take 1 tablet (75 mg total) by mouth 2 (two) times daily., Disp: 30 tablet, Rfl: 0   diclofenac Sodium (VOLTAREN) 1 % GEL, Apply 4 g topically 4 (four) times daily as needed (knee pain/ arthritis)., Disp: 300 g, Rfl: 2   FLUoxetine (PROZAC) 20 MG capsule, Take 1 capsule (20 mg total) by mouth daily. (Patient taking differently: Take 20 mg by mouth at bedtime.), Disp: 90 capsule, Rfl: 3   loratadine (CLARITIN) 10 MG tablet, Take 1 tablet (10 mg total) by mouth daily., Disp: 90 tablet, Rfl: 2   meclizine (ANTIVERT) 25 MG tablet, Take 1 tablet (25 mg total) by mouth 3 (three) times daily as needed for dizziness., Disp: 90 tablet, Rfl: 1   mometasone (NASONEX) 50 MCG/ACT nasal spray, Place 2 sprays into the nose daily. (Patient taking differently: Place 2 sprays into the nose daily as needed (allergies.).), Disp: 1 each, Rfl: 12   Multiple Vitamin (TAB-A-VITE) TABS, TAKE ONE TABLET BY MOUTH DAILY (Patient taking differently: Take 1 tablet by mouth at bedtime.), Disp: 90 tablet, Rfl: 1   omeprazole (PRILOSEC) 40 MG capsule, Take 1 capsule (40 mg total) by mouth 2 (two) times daily., Disp: 90 capsule, Rfl: 3   ondansetron (ZOFRAN-ODT) 4 MG disintegrating tablet, '4mg'$  ODT q4 hours prn nausea/vomit, Disp:  12 tablet, Rfl: 0   sucralfate (CARAFATE) 1 GM/10ML suspension, TAKE 10 MLS BY MOUTH FOUR TIMES DAILY WITH MEALS AND AT BEDTIME, Disp: 420 mL, Rfl: 1   tiZANidine (ZANAFLEX) 4 MG tablet, Take 0.5-1 tablets (2-4 mg total) by mouth every 8 (eight) hours as needed for muscle spasms (USE SPARINGLY.)., Disp: 30 tablet, Rfl: 5   VENTOLIN HFA 108 (90 Base) MCG/ACT inhaler, INHALE TWO PUFFS INTO THE LUNGS EVERY 6 HOURS AS NEEDED FOR WHEEZING OR SHORTNESS OF BREATH, Disp: 18 g, Rfl: 2 Social History   Socioeconomic History   Marital status: Single    Spouse name: Not on file   Number of children: 2   Years of education: Not on file   Highest education level: Not on  file  Occupational History   Occupation: Disability  Tobacco Use   Smoking status: Every Day    Packs/day: 1.00    Years: 35.00    Total pack years: 35.00    Types: Cigarettes    Passive exposure: Current   Smokeless tobacco: Never   Tobacco comments:    1 pack every 2-3 days  Vaping Use   Vaping Use: Some days  Substance and Sexual Activity   Alcohol use: Not Currently    Alcohol/week: 3.0 standard drinks of alcohol    Types: 3 Cans of beer per week   Drug use: Yes    Types: Marijuana    Comment: last cocaine was 07/15/2017.   Sexual activity: Not Currently    Birth control/protection: Condom, None    Comment: patient given condoms  Other Topics Concern   Not on file  Social History Narrative   Pt lives in an apartment by herself.  Pt stated that she is married and that her husband is in a Corporate treasurer, and that husband is being released yesterday or today.  Asked several times who Pt's psychiatric provider is.   Social Determinants of Health   Financial Resource Strain: Not on file  Food Insecurity: Not on file  Transportation Needs: Not on file  Physical Activity: Not on file  Stress: Not on file  Social Connections: Not on file  Intimate Partner Violence: Not on file   Family History  Problem Relation Age of Onset   Diabetes Mother    Hypertension Mother    Stroke Mother        died 09/27/2013  Cancer Sister    Cancer Maternal Aunt     Objective: Office vital signs reviewed. BP (!) 150/86   Pulse 85   Temp 97.8 F (36.6 C)   Ht '4\' 7"'$  (1.397 m)   Wt 122 lb 3.2 oz (55.4 kg)   SpO2 100%   BMI 28.40 kg/m   Physical Examination:  General: Awake, alert, nontoxic female, No acute distress HEENT: Normal    Neck: No masses palpated. No lymphadenopathy    Ears: Tympanic membranes intact, normal light reflex, no erythema, no bulging    Eyes: PERRLA, extraocular membranes intact, sclera white    Nose: nasal turbinates moist, clear nasal discharge     Throat: moist mucus membranes, no erythema, no tonsillar exudate.  Airway is patent Cardio: regular rate and rhythm, S1S2 heard, no murmurs appreciated Pulm: Mild expiratory wheezes noted throughout.  Normal work of breathing on room air.  Coughing intermittently. Psych: Mood somewhat labile but patient interactive with provider.     10/26/2021    9:50 AM 10/13/2021   10:30 AM 08/08/2021   10:32 AM  Depression screen PHQ 2/9  Decreased Interest 2 0 2  Down, Depressed, Hopeless 2 0 3  PHQ - 2 Score 4 0 5  Altered sleeping 1  2  Tired, decreased energy 2  2  Change in appetite 2  0  Feeling bad or failure about yourself  2  2  Trouble concentrating 2  3  Moving slowly or fidgety/restless 2  2  Suicidal thoughts 0  1  PHQ-9 Score 15  17  Difficult doing work/chores Somewhat difficult  Somewhat difficult      10/26/2021    9:51 AM 08/08/2021   10:32 AM 07/05/2021   10:32 AM 05/17/2020    3:11 PM  GAD 7 : Generalized Anxiety Score  Nervous, Anxious, on Edge '2 2 1 1  '$ Control/stop worrying '2 2 2 2  '$ Worry too much - different things '2 2 2 2  '$ Trouble relaxing '2 2 1 2  '$ Restless '2 2 1 1  '$ Easily annoyed or irritable '2 2 1 2  '$ Afraid - awful might happen 0 '2 1 1  '$ Total GAD 7 Score '12 14 9 11  '$ Anxiety Difficulty Somewhat difficult Somewhat difficult Somewhat difficult Somewhat difficult    DG Chest 2 View  Result Date: 11/14/2021 CLINICAL DATA:  Cough.  HIV. EXAM: CHEST - 2 VIEW COMPARISON:  03/05/2016 FINDINGS: The heart size and mediastinal contours are within normal limits. Stable tortuosity of thoracic aorta. Both lungs are clear. Lower thoracic spine degenerative disc disease again noted. IMPRESSION: No active cardiopulmonary disease. Electronically Signed   By: Marlaine Hind M.D.   On: 11/14/2021 10:54     Assessment/ Plan: 56 y.o. female   Grief reaction - Plan: FLUoxetine (PROZAC) 40 MG capsule, hydrOXYzine (VISTARIL) 25 MG capsule  Generalized anxiety disorder with panic attacks  - Plan: FLUoxetine (PROZAC) 40 MG capsule, hydrOXYzine (VISTARIL) 25 MG capsule  Bronchitis - Plan: Novel Coronavirus, NAA ([facility]), methylPREDNISolone acetate (DEPO-MEDROL) injection 40 mg, guaiFENesin-codeine (ROBITUSSIN AC) 100-10 MG/5ML syrup, predniSONE (STERAPRED UNI-PAK 21 TAB) 10 MG (21) TBPK tablet, DG Chest 2 View  Human immunodeficiency virus (HIV) disease ([place]) - Plan: DG Chest 2 View  Anxiety is not well controlled nor is her depression.  Advance Prozac to 40 mg daily.  Vistaril added for as needed use.  Follow-up in the next 6 to 8 weeks for recheck  COVID-19 testing performed.  I worry that she may have COVID-19.  Given her HIV status she most certainly would warrant treatment with Molnupiravir.  In the meantime I have sent her a cough suppressant, given her a steroid shot and would like her to start oral prednisone tomorrow.  She was given a DuoNeb here in office as well.  Continue albuterol inhaler 2 puffs every 6 hours as needed cough or shortness of breath.  Her chest x-ray did not demonstrate any pulmonary infiltrates to suggest secondary pneumonia at this time but we discussed red flag signs and symptoms which would warrant further evaluation.  No orders of the defined types were placed in this encounter.  No orders of the defined types were placed in this encounter.    Janora Norlander, DO [place] [phone]

## 2021-11-14 NOTE — Patient Instructions (Signed)
  Acute Bronchitis, Adult  Acute bronchitis is when air tubes in the lungs (bronchi) suddenly get swollen. The condition can make it hard for you to breathe. In adults, acute bronchitis usually goes away within 2 weeks. A cough caused by bronchitis may last up to 3 weeks. Smoking, allergies, and asthma can make the condition worse. What are the causes? Germs that cause cold and flu (viruses). The most common cause of this condition is the virus that causes the common cold. Bacteria. Substances that bother (irritate) the lungs, including: Smoke from cigarettes and other types of tobacco. Dust and pollen. Fumes from chemicals, gases, or burned fuel. Indoor or outdoor air pollution. What increases the risk? A weak body's defense system. This is also called the immune system. Any condition that affects your lungs and breathing, such as asthma. What are the signs or symptoms? A cough. Coughing up clear, yellow, or green mucus. Making high-pitched whistling sounds when you breathe, most often when you breathe out (wheezing). Runny or stuffy nose. Having too much mucus in your lungs (chest congestion). Shortness of breath. Body aches. A sore throat. How is this treated? Acute bronchitis may go away over time without treatment. Your doctor may tell you to: Drink more fluids. This will help thin your mucus so it is easier to cough up. Use a device that gets medicine into your lungs (inhaler). Use a vaporizer or a humidifier. These are machines that add water to the air. This helps with coughing and poor breathing. Take a medicine that thins mucus and helps clear it from your lungs. Take a medicine that prevents or stops coughing. It is not common to take an antibiotic medicine for this condition. Follow these instructions at home:  Take over-the-counter and prescription medicines only as told by your doctor. Use an inhaler, vaporizer, or humidifier as told by your doctor. Take two  teaspoons (10 mL) of honey at bedtime. This helps lessen your coughing at night. Drink enough fluid to keep your pee (urine) pale yellow. Do not smoke or use any products that contain nicotine or tobacco. If you need help quitting, ask your doctor. Get a lot of rest. Return to your normal activities when your doctor says that it is safe. Keep all follow-up visits. How is this prevented?  Wash your hands often with soap and water for at least 20 seconds. If you cannot use soap and water, use hand sanitizer. Avoid contact with people who have cold symptoms. Try not to touch your mouth, nose, or eyes with your hands. Avoid breathing in smoke or chemical fumes. Make sure to get the flu shot every year. Contact a doctor if: Your symptoms do not get better in 2 weeks. You have trouble coughing up the mucus. Your cough keeps you awake at night. You have a fever. Get help right away if: You cough up blood. You have chest pain. You have very bad shortness of breath. You faint or keep feeling like you are going to faint. You have a very bad headache. Your fever or chills get worse. These symptoms may be an emergency. Get help right away. Call your local emergency services (911 in the [place] Do not wait to see if the symptoms will go away. Do not drive yourself to the hospital. Summary Acute bronchitis is when air tubes in the lungs (bronchi) suddenly get swollen. In adults, acute bronchitis usually goes away within 2 weeks. Drink more fluids. This will help thin your mucus so it   is easier to cough up. Take over-the-counter and prescription medicines only as told by your doctor. Contact a doctor if your symptoms do not improve after 2 weeks of treatment. This information is not intended to replace advice given to you by your health care provider. Make sure you discuss any questions you have with your health care provider. Document Revised: 06/16/2020 Document Reviewed: 06/16/2020 Elsevier  Patient Education  2023 Elsevier Inc.  

## 2021-11-15 LAB — NOVEL CORONAVIRUS, NAA: SARS-CoV-2, NAA: NOT DETECTED

## 2021-11-24 ENCOUNTER — Other Ambulatory Visit: Payer: Self-pay | Admitting: Family Medicine

## 2021-11-24 DIAGNOSIS — J40 Bronchitis, not specified as acute or chronic: Secondary | ICD-10-CM

## 2021-11-29 ENCOUNTER — Other Ambulatory Visit: Payer: Self-pay | Admitting: Family Medicine

## 2021-12-06 ENCOUNTER — Other Ambulatory Visit: Payer: Self-pay | Admitting: Family

## 2021-12-06 DIAGNOSIS — M5412 Radiculopathy, cervical region: Secondary | ICD-10-CM

## 2021-12-08 ENCOUNTER — Ambulatory Visit (INDEPENDENT_AMBULATORY_CARE_PROVIDER_SITE_OTHER): Payer: Medicaid Other | Admitting: Gastroenterology

## 2021-12-08 ENCOUNTER — Encounter (INDEPENDENT_AMBULATORY_CARE_PROVIDER_SITE_OTHER): Payer: Self-pay | Admitting: Gastroenterology

## 2021-12-14 ENCOUNTER — Other Ambulatory Visit: Payer: Self-pay | Admitting: Family Medicine

## 2021-12-14 DIAGNOSIS — R42 Dizziness and giddiness: Secondary | ICD-10-CM

## 2021-12-14 DIAGNOSIS — H8110 Benign paroxysmal vertigo, unspecified ear: Secondary | ICD-10-CM

## 2021-12-14 MED ORDER — MECLIZINE HCL 25 MG PO TABS: 25.0000 mg | ORAL_TABLET | Freq: Three times a day (TID) | ORAL | 1 refills | Status: DC | PRN

## 2021-12-14 NOTE — Telephone Encounter (Signed)
  Prescription Request  12/14/2021  What is the name of the medication or equipment? MECLIZINE  Have you contacted your pharmacy to request a refill? YES  Which pharmacy would you like this sent to?  [facility], [place]

## 2021-12-14 NOTE — Telephone Encounter (Signed)
LMOVM refill sent to pharmacy 

## 2021-12-16 ENCOUNTER — Telehealth: Payer: Self-pay | Admitting: Family Medicine

## 2021-12-16 DIAGNOSIS — R42 Dizziness and giddiness: Secondary | ICD-10-CM

## 2021-12-16 DIAGNOSIS — H8110 Benign paroxysmal vertigo, unspecified ear: Secondary | ICD-10-CM

## 2021-12-16 MED ORDER — MECLIZINE HCL 25 MG PO TABS: 25.0000 mg | ORAL_TABLET | Freq: Three times a day (TID) | ORAL | 1 refills | Status: DC | PRN

## 2021-12-16 NOTE — Telephone Encounter (Signed)
Refill corrected & sent to [facility]

## 2021-12-31 ENCOUNTER — Other Ambulatory Visit: Payer: Self-pay | Admitting: Family Medicine

## 2021-12-31 DIAGNOSIS — F4321 Adjustment disorder with depressed mood: Secondary | ICD-10-CM

## 2021-12-31 DIAGNOSIS — J40 Bronchitis, not specified as acute or chronic: Secondary | ICD-10-CM

## 2021-12-31 DIAGNOSIS — F410 Panic disorder [episodic paroxysmal anxiety] without agoraphobia: Secondary | ICD-10-CM

## 2021-12-31 DIAGNOSIS — M5412 Radiculopathy, cervical region: Secondary | ICD-10-CM

## 2022-01-09 ENCOUNTER — Other Ambulatory Visit: Payer: Self-pay | Admitting: Family Medicine

## 2022-01-09 DIAGNOSIS — J329 Chronic sinusitis, unspecified: Secondary | ICD-10-CM

## 2022-01-10 ENCOUNTER — Encounter (INDEPENDENT_AMBULATORY_CARE_PROVIDER_SITE_OTHER): Payer: Self-pay | Admitting: Gastroenterology

## 2022-01-20 ENCOUNTER — Other Ambulatory Visit: Payer: Self-pay | Admitting: Family Medicine

## 2022-01-20 DIAGNOSIS — J439 Emphysema, unspecified: Secondary | ICD-10-CM

## 2022-01-27 ENCOUNTER — Other Ambulatory Visit (INDEPENDENT_AMBULATORY_CARE_PROVIDER_SITE_OTHER): Payer: Self-pay | Admitting: Gastroenterology

## 2022-02-03 ENCOUNTER — Ambulatory Visit: Payer: Medicaid Other | Admitting: Family Medicine

## 2022-02-06 ENCOUNTER — Encounter: Payer: Self-pay | Admitting: Nurse Practitioner

## 2022-02-06 ENCOUNTER — Ambulatory Visit (INDEPENDENT_AMBULATORY_CARE_PROVIDER_SITE_OTHER): Payer: Medicaid Other | Admitting: Nurse Practitioner

## 2022-02-06 DIAGNOSIS — J069 Acute upper respiratory infection, unspecified: Secondary | ICD-10-CM | POA: Diagnosis not present

## 2022-02-06 DIAGNOSIS — R509 Fever, unspecified: Secondary | ICD-10-CM | POA: Diagnosis not present

## 2022-02-06 MED ORDER — FLUTICASONE PROPIONATE 50 MCG/ACT NA SUSP: 2.0000 | Freq: Every day | NASAL | 6 refills | Status: DC

## 2022-02-06 MED ORDER — ACETAMINOPHEN 500 MG PO TABS: 500.0000 mg | ORAL_TABLET | Freq: Four times a day (QID) | ORAL | 0 refills | Status: DC | PRN

## 2022-02-06 MED ORDER — GUAIFENESIN ER 600 MG PO TB12: 600.0000 mg | ORAL_TABLET | Freq: Two times a day (BID) | ORAL | 0 refills | Status: DC

## 2022-02-06 NOTE — Patient Instructions (Signed)

## 2022-02-06 NOTE — Progress Notes (Signed)
   Virtual Visit  Note Due to COVID-19 pandemic this visit was conducted virtually. This visit type was conducted due to national recommendations for restrictions regarding the COVID-19 Pandemic (e.g. social distancing, sheltering in place) in an effort to limit this patient's exposure and mitigate transmission in our community. All issues noted in this document were discussed and addressed.  A physical exam was not performed with this format.  I connected with Ashley [name] on 02/06/22 at 9;20 am by telephone and verified that I am speaking with the correct person using two identifiers. Ashley [name] is currently located at home during visit. The provider, Ivy Lynn, NP is located in their office at time of visit.  I discussed the limitations, risks, security and privacy concerns of performing an evaluation and management service by telephone and the availability of in person appointments. I also discussed with the patient that there may be a patient responsible charge related to this service. The patient expressed understanding and agreed to proceed.   History and Present Illness:  URI  This is a new problem. The current episode started in the past 7 days. The problem has been gradually worsening. There has been no fever. Associated symptoms include congestion, coughing, headaches and sinus pain. Pertinent negatives include no abdominal pain, nausea or rash. She has tried acetaminophen for the symptoms. The treatment provided no relief.      Review of Systems  Constitutional: Negative.  Negative for chills and fever.  HENT:  Positive for congestion and sinus pain.   Respiratory:  Positive for cough.   Cardiovascular: Negative.   Gastrointestinal:  Negative for abdominal pain and nausea.  Musculoskeletal: Negative.   Skin: Negative.  Negative for rash.  Neurological:  Positive for headaches.  All other systems reviewed and are negative.    Observations/Objective: Televisit  patient not in distress.  Assessment and Plan: Patient presents with symptoms of upper respiratory infection with cold, fever, cough and congestion.  For the past 4 days.  Take meds as prescribed - Use a cool mist humidifier  -Use saline nose sprays frequently -Force fluids -For fever or aches or pains- take Tylenol or ibuprofen. -Completed RSV/COVID-19/flu swab results pending.   Follow Up Instructions: Follow-up with worsening unresolved symptoms.    I discussed the assessment and treatment plan with the patient. The patient was provided an opportunity to ask questions and all were answered. The patient agreed with the plan and demonstrated an understanding of the instructions.   The patient was advised to call back or seek an in-person evaluation if the symptoms worsen or if the condition fails to improve as anticipated.  The above assessment and management plan was discussed with the patient. The patient verbalized understanding of and has agreed to the management plan. Patient is aware to call the clinic if symptoms persist or worsen. Patient is aware when to return to the clinic for a follow-up visit. Patient educated on when it is appropriate to go to the emergency department.   Time call ended: 9:32 AM.  I provided 12 minutes of  non face-to-face time during this encounter.    Ivy Lynn, NP

## 2022-02-07 ENCOUNTER — Telehealth: Payer: Self-pay | Admitting: Family Medicine

## 2022-02-07 ENCOUNTER — Other Ambulatory Visit: Payer: Self-pay | Admitting: *Deleted

## 2022-02-07 DIAGNOSIS — J069 Acute upper respiratory infection, unspecified: Secondary | ICD-10-CM

## 2022-02-07 DIAGNOSIS — R509 Fever, unspecified: Secondary | ICD-10-CM

## 2022-02-07 MED ORDER — FLUTICASONE PROPIONATE 50 MCG/ACT NA SUSP: 2.0000 | Freq: Every day | NASAL | 6 refills | Status: DC

## 2022-02-07 MED ORDER — ACETAMINOPHEN 500 MG PO TABS: 500.0000 mg | ORAL_TABLET | Freq: Four times a day (QID) | ORAL | 0 refills | Status: DC | PRN

## 2022-02-07 MED ORDER — GUAIFENESIN ER 600 MG PO TB12: 600.0000 mg | ORAL_TABLET | Freq: Two times a day (BID) | ORAL | 0 refills | Status: DC

## 2022-02-07 NOTE — Telephone Encounter (Signed)
Patient requested different pharmacy. New rx sent.  Contacted patient

## 2022-02-08 LAB — COVID-19, FLU A+B AND RSV
Influenza A, NAA: NOT DETECTED
Influenza B, NAA: NOT DETECTED
RSV, NAA: NOT DETECTED
SARS-CoV-2, NAA: NOT DETECTED

## 2022-02-09 ENCOUNTER — Telehealth: Payer: Self-pay | Admitting: Family Medicine

## 2022-02-09 NOTE — Telephone Encounter (Signed)
Patient called in 12/13 about her appt scheduled on 12/19 that was canceled due to the provider overbooked.  She was upset about this and her transportation that she had to find.  We were having connection issues and she gave me a new telephone number to reach her at for her to be rescheduled.  Needs morning appointment.  LMOVM to call us back.

## 2022-02-14 ENCOUNTER — Ambulatory Visit: Payer: Medicaid Other | Admitting: Family Medicine

## 2022-02-15 ENCOUNTER — Other Ambulatory Visit: Payer: Self-pay

## 2022-02-15 DIAGNOSIS — F4321 Adjustment disorder with depressed mood: Secondary | ICD-10-CM

## 2022-02-15 DIAGNOSIS — F410 Panic disorder [episodic paroxysmal anxiety] without agoraphobia: Secondary | ICD-10-CM

## 2022-02-15 DIAGNOSIS — J329 Chronic sinusitis, unspecified: Secondary | ICD-10-CM

## 2022-02-15 DIAGNOSIS — J439 Emphysema, unspecified: Secondary | ICD-10-CM

## 2022-02-15 DIAGNOSIS — J40 Bronchitis, not specified as acute or chronic: Secondary | ICD-10-CM

## 2022-02-15 DIAGNOSIS — J069 Acute upper respiratory infection, unspecified: Secondary | ICD-10-CM

## 2022-02-15 MED ORDER — ALBUTEROL SULFATE HFA 108 (90 BASE) MCG/ACT IN AERS: INHALATION_SPRAY | RESPIRATORY_TRACT | 2 refills | Status: DC

## 2022-02-15 MED ORDER — LORATADINE 10 MG PO TABS: 10.0000 mg | ORAL_TABLET | Freq: Every day | ORAL | 1 refills | Status: DC

## 2022-02-15 MED ORDER — FLUOXETINE HCL 40 MG PO CAPS: 40.0000 mg | ORAL_CAPSULE | Freq: Every day | ORAL | 0 refills | Status: DC

## 2022-02-15 NOTE — Telephone Encounter (Signed)
Patient called requesting refill on prozaxc, sucrafale, inhaler, allergy meds, ear drops, and her cough syrup. I have pended all the medication except the ear drop, if this something we can refill?

## 2022-02-23 ENCOUNTER — Other Ambulatory Visit (INDEPENDENT_AMBULATORY_CARE_PROVIDER_SITE_OTHER): Payer: Self-pay | Admitting: Gastroenterology

## 2022-02-23 ENCOUNTER — Other Ambulatory Visit: Payer: Self-pay | Admitting: Internal Medicine

## 2022-02-23 ENCOUNTER — Other Ambulatory Visit: Payer: Self-pay | Admitting: Nurse Practitioner

## 2022-02-23 DIAGNOSIS — B20 Human immunodeficiency virus [HIV] disease: Secondary | ICD-10-CM

## 2022-02-23 DIAGNOSIS — H60502 Unspecified acute noninfective otitis externa, left ear: Secondary | ICD-10-CM

## 2022-02-24 ENCOUNTER — Other Ambulatory Visit: Payer: Self-pay | Admitting: Family Medicine

## 2022-03-01 ENCOUNTER — Other Ambulatory Visit: Payer: Self-pay | Admitting: Family Medicine

## 2022-03-01 DIAGNOSIS — F410 Panic disorder [episodic paroxysmal anxiety] without agoraphobia: Secondary | ICD-10-CM

## 2022-03-01 DIAGNOSIS — F4321 Adjustment disorder with depressed mood: Secondary | ICD-10-CM

## 2022-03-06 ENCOUNTER — Telehealth (INDEPENDENT_AMBULATORY_CARE_PROVIDER_SITE_OTHER): Payer: Self-pay | Admitting: *Deleted

## 2022-03-06 ENCOUNTER — Other Ambulatory Visit: Payer: Self-pay | Admitting: Family Medicine

## 2022-03-06 ENCOUNTER — Ambulatory Visit (INDEPENDENT_AMBULATORY_CARE_PROVIDER_SITE_OTHER): Payer: Medicaid Other | Admitting: Gastroenterology

## 2022-03-06 ENCOUNTER — Encounter (INDEPENDENT_AMBULATORY_CARE_PROVIDER_SITE_OTHER): Payer: Self-pay | Admitting: Gastroenterology

## 2022-03-06 DIAGNOSIS — M5412 Radiculopathy, cervical region: Secondary | ICD-10-CM

## 2022-03-06 DIAGNOSIS — K219 Gastro-esophageal reflux disease without esophagitis: Secondary | ICD-10-CM

## 2022-03-06 DIAGNOSIS — R112 Nausea with vomiting, unspecified: Secondary | ICD-10-CM

## 2022-03-06 DIAGNOSIS — K2980 Duodenitis without bleeding: Secondary | ICD-10-CM

## 2022-03-06 DIAGNOSIS — B20 Human immunodeficiency virus [HIV] disease: Secondary | ICD-10-CM | POA: Diagnosis not present

## 2022-03-06 MED ORDER — OMEPRAZOLE 40 MG PO CPDR: 40.0000 mg | DELAYED_RELEASE_CAPSULE | Freq: Two times a day (BID) | ORAL | 3 refills | Status: DC

## 2022-03-06 NOTE — Progress Notes (Addendum)
Primary Care Physician:  Janora Norlander, DO  Primary GI: Jenetta Downer   Patient Location: Home   Provider Location: [place] [facility] office   Reason for Visit: follow up of nausea/epigastric pain   Persons present on the virtual encounter, with roles: Hines Kloss L. Alver Sorrow, MSN, APRN, AGNP-C, Ashley [name], patient    Total time (minutes) spent on medical discussion: 10 minutes  Virtual Visit via Telephone visit is conducted virtually and was requested by patient.   I connected with Ashley [name] on 03/06/22 at  2:00 PM EST by telephone and verified that I am speaking with the correct person using two identifiers.   I discussed the limitations, risks, security and privacy concerns of performing an evaluation and management service by telephone and the availability of in person appointments. I also discussed with the patient that there may be a patient responsible charge related to this service. The patient expressed understanding and agreed to proceed.  Chief Complaint  Patient presents with   Gastroesophageal Reflux    Phone visit to follow up on GERD. Reports doing well and has no concerns today.    History of Present Illness: Ashley [name] is a 57 y.o. female with past medical history of HIV on Biktarvy, asthma, COPD, diabetes, collagen vascular disease, hypothyroidism, hepatitis C (achieved SVR), depression.  Last seen July 2023, at that time  having nausea and epigastric pain. taking PPI twice a day. almost out of the carafate. She is not drinking or taking any NSAIDs. appetite better than it was, she is doing ensure shakes a few times per day. Weight is stable. She is using carafate as needed for nausea. Sometimes eating worsens her nausea, still having epigastric pain as well that is pretty constant. She is having vomiting, usually once a day but improved from previously where she was vomiting almost all day long. She notes that she has a compression band she has worn before  that actually helps her abdominal pain. Has some occasional heartburn/acid regurgitation. She is taking PPI around 11-12 in the morning and again prior to bed.    Recommended stop protonix, start omeprazole '40mg'$  BID, carafate 1g QID, take PPI earlier in the morning, continue to avoid ETOH and NSAIDs.  Present:  Patient she is still having some nausea/vomiting, heartburn and epigastric discomfort. Symptoms have improved quite a bit with change to omeprazole. She is having nausea maybe twice per week now. Occasional vomiting. She is still noting some heartburn and acid regurgitation, usually occurring around the time she has nausea. Symptoms do seem to depend some on what she is eating. Notes more symptoms with junk foods, tomato sauces. Epigastric pain tends to occur along with her other symptoms. She reports compliance with her PPI BID and carafate 1g QID. Denies dysphagia or odynophagia. Appetite is better. Reports weight is around 130 which is baseline for her. No constipation or diarrhea. No rectal bleeding or melena.   Last Colonoscopy:07/29/21- Perianal skin tags found on perianal exam. - Two 3 to 5 mm polyps in the ascending colon (tubular adenoma x2)  - One 3 mm polyp in the sigmoid colon- Non-bleeding internal hemorrhoids Last Endoscopy:07/29/21- 1 cm hiatal hernia. - Normal stomach. Biopsied-neg for H pylori - Duodenitis.  Past Medical History:  Diagnosis Date   Ankle fracture    Anxiety    Asthma    Collagen vascular disease ([place])    COPD (chronic obstructive pulmonary disease) ([place])    Depression    Diabetes mellitus  Elevated LFTs 01/13/2012   GERD (gastroesophageal reflux disease)    Hepatitis C    Hernia    HIV (human immunodeficiency virus infection) ([place])    HIV (human immunodeficiency virus infection) ([place])    Hypertension    Hypothyroidism    Pinched nerve    Scoliosis    TB (tuberculosis)      Past Surgical History:  Procedure Laterality Date   BIOPSY   05/08/2018   Procedure: biopsy;  Surgeon: Rogene Houston, MD;  [facility];  Service: Endoscopy;;  proximal transverse colon polyp   BIOPSY  10/22/2019   Procedure: BIOPSY;  Surgeon: Rogene Houston, MD;  [facility];  Service: Endoscopy;;   BIOPSY  07/29/2021   Procedure: BIOPSY;  Surgeon: Harvel Quale, MD;  [facility];  Service: Gastroenterology;;   CESAREAN SECTION     CHOLECYSTECTOMY     COLONOSCOPY N/A 05/08/2018   Procedure: COLONOSCOPY;  Surgeon: Rogene Houston, MD;  [facility];  Service: Endoscopy;  Laterality: N/A;  2:00   COLONOSCOPY WITH PROPOFOL N/A 07/29/2021   Procedure: COLONOSCOPY WITH PROPOFOL;  Surgeon: Harvel Quale, MD;  [facility];  Service: Gastroenterology;  Laterality: N/A;  1245   ESOPHAGEAL DILATION  10/22/2019   Procedure: ESOPHAGEAL DILATION;  Surgeon: Rogene Houston, MD;  [facility];  Service: Endoscopy;;   ESOPHAGOGASTRODUODENOSCOPY (EGD) WITH PROPOFOL N/A 10/22/2019   Procedure: ESOPHAGOGASTRODUODENOSCOPY (EGD) WITH PROPOFOL;  Surgeon: Rogene Houston, MD;  [facility];  Service: Endoscopy;  Laterality: N/A;  1220   ESOPHAGOGASTRODUODENOSCOPY (EGD) WITH PROPOFOL N/A 07/29/2021   Procedure: ESOPHAGOGASTRODUODENOSCOPY (EGD) WITH PROPOFOL;  Surgeon: Harvel Quale, MD;  [facility];  Service: Gastroenterology;  Laterality: N/A;   HERNIA REPAIR     x2   INCISIONAL HERNIA REPAIR N/A 09/30/2018   Procedure: OPEN HERNIA REPAIR INCISIONAL WITH MESH;  Surgeon: Virl Cagey, MD;  Location: [facility];  Service: General;  Laterality: N/A;   LIVER BIOPSY       Current Meds  Medication Sig   acetaminophen (TYLENOL) 500 MG tablet Take 1 tablet (500 mg total) by mouth every 6 (six) hours as needed (pain.).   albuterol (VENTOLIN HFA) 108 (90 Base) MCG/ACT inhaler INHALE TWO PUFFS INTO THE LUNGS EVERY 6 HOURS AS NEEDED FOR WHEEZING OR  SHORTNESS OF BREATH   amLODipine (NORVASC) 2.5 MG tablet Take 1 tablet (2.5 mg total) by mouth daily. For blood pressure (Patient taking differently: Take 2.5 mg by mouth at bedtime. For blood pressure)   bictegravir-emtricitabine-tenofovir AF (BIKTARVY) 50-200-25 MG TABS tablet Take 1 tablet by mouth daily. (Patient taking differently: Take 1 tablet by mouth every evening.)   diclofenac (VOLTAREN) 75 MG EC tablet Take 1 tablet (75 mg total) by mouth 2 (two) times daily.   diclofenac Sodium (VOLTAREN) 1 % GEL Apply 4 g topically 4 (four) times daily as needed (knee pain/ arthritis).   FLUoxetine (PROZAC) 40 MG capsule Take 1 capsule (40 mg total) by mouth daily.   fluticasone (FLONASE) 50 MCG/ACT nasal spray Place 2 sprays into both nostrils daily.   guaiFENesin (MUCINEX) 600 MG 12 hr tablet Take 1 tablet (600 mg total) by mouth 2 (two) times daily.   hydrOXYzine (VISTARIL) 25 MG capsule TAKE 1 CAPSULE(25 MG) BY MOUTH EVERY 8 HOURS AS NEEDED FOR ANXIETY   loratadine (ALLERGY RELIEF) 10 MG tablet Take 1 tablet (10 mg total) by mouth daily.  meclizine (ANTIVERT) 25 MG tablet Take 1 tablet (25 mg total) by mouth 3 (three) times daily as needed for dizziness.   mometasone (NASONEX) 50 MCG/ACT nasal spray Place 2 sprays into the nose daily. (Patient taking differently: Place 2 sprays into the nose daily as needed (allergies.).)   Multiple Vitamin (TAB-A-VITE) TABS TAKE ONE TABLET BY MOUTH DAILY   omeprazole (PRILOSEC) 40 MG capsule TAKE ONE CAPSULE BY MOUTH TWICE DAILY   ondansetron (ZOFRAN-ODT) 4 MG disintegrating tablet '4mg'$  ODT q4 hours prn nausea/vomit   sucralfate (CARAFATE) 1 GM/10ML suspension TAKE 10 MLS BY MOUTH FOUR TIMES DAILY WITH MEALS AND AT BEDTIME   tiZANidine (ZANAFLEX) 4 MG tablet TAKE 1/2 TO 1 TABLET BY MOUTH EVERY 8 HOURS AS NEEDED FOR MUSCLE SPASMS. USE SPARINGLY.     Family History  Problem Relation Age of Onset   Diabetes Mother    Hypertension Mother    Stroke Mother         died October 02, 2013  Cancer Sister    Cancer Maternal Aunt     Social History   Socioeconomic History   Marital status: Single    Spouse name: Not on file   Number of children: 2   Years of education: Not on file   Highest education level: Not on file  Occupational History   Occupation: Disability  Tobacco Use   Smoking status: Every Day    Packs/day: 1.00    Years: 35.00    Total pack years: 35.00    Types: Cigarettes    Passive exposure: Current   Smokeless tobacco: Never   Tobacco comments:    1 pack every 2-3 days  Vaping Use   Vaping Use: Some days  Substance and Sexual Activity   Alcohol use: Not Currently    Alcohol/week: 3.0 standard drinks of alcohol    Types: 3 Cans of beer per week   Drug use: Yes    Types: Marijuana    Comment: last cocaine was 07/15/2017.   Sexual activity: Not Currently    Birth control/protection: Condom, None    Comment: patient given condoms  Other Topics Concern   Not on file  Social History Narrative   Pt lives in an apartment by herself.  Pt stated that she is married and that her husband is in a [employer] treasure[employer], and that husband is being released yesterday or today.  Asked several times who Pt's psychiatric provider is.   Social Determinants of Health   Financial Resource Strain: Not on file  Food Insecurity: Not on file  Transportation Needs: Not on file  Physical Activity: Not on file  Stress: Not on file  Social Connections: Not on file   Review of Systems: Gen: Denies fever, chills, anorexia. Denies fatigue, weakness, weight loss.  CV: Denies chest pain, palpitations, syncope, peripheral edema, and claudication. Resp: Denies dyspnea at rest, cough, wheezing, coughing up blood, and pleurisy. GI: see HPI Derm: Denies rash, itching, dry skin Psych: Denies depression, anxiety, memory loss, confusion. No homicidal or suicidal ideation.  Heme: Denies bruising, bleeding, and enlarged lymph  nodes.  Observations/Objective: No distress. Unable to perform physical exam due to telephone encounter. No video available.   Assessment and Plan: Ashley [name] is a 57 y.o. female with past medical history of HIV on Biktarvy, asthma, COPD, diabetes, collagen vascular disease, hypothyroidism, hepatitis C (achieved SVR), depression.  Nausea/vomiting/epigastric pain/GERD: EGD in June 2023 with duodenitis, likely contributing to her symptoms. Still having some nausea, vomiting, epigastric  pain and GERD symptoms, though symptoms have improved quite a bit with switch to omeprazole '40mg'$  BID and use of carafate 1g QID. She has not implemented dietary and lifestyle changes. Feels that she is having symptoms usually after eating junk food or tomato based foods. Given improvement in symptoms with change in therapy, I discussed with her the importance of strict dietary and lifestyle modifications to include Avoiding greasy, spicy, fried, tomato based, citrus foods, and be mindful that caffeine, carbonated drinks, chocolate and alcohol can increase reflux symptoms. stay upright 2-3 hours after eating, prior to lying down and avoid eating late in the evenings. Should avoid NSAIDs.  -continue omeprazole '40mg'$  BID -continue carafate 1g QID -strict reflux precautions -consider EGD if symptoms not improved with dietary/lifestyle modifications    Follow Up Instructions: 6-8 weeks    I discussed the assessment and treatment plan with the patient. The patient was provided an opportunity to ask questions and all were answered. The patient agreed with the plan and demonstrated an understanding of the instructions.   The patient was advised to call back or seek an in-person evaluation if the symptoms worsen or if the condition fails to improve as anticipated.  I provided 10 minutes of NON face-to-face time during this telephone encounter.  Trejon Duford L. Alver Sorrow, MSN, APRN, AGNP-C Adult-Gerontology Nurse  Practitioner [facility]  I have reviewed the note and agree with the APP's assessment as described in this progress note  Maylon Peppers, MD Gastroenterology and Hepatology [facility]

## 2022-03-06 NOTE — Patient Instructions (Addendum)
Continue omeprazole '40mg'$  twice a day, one in the morning and one in the evening Continue carafate 1g 4x/day, before meals and at bedtime It is important that you follow strict reflux precautions to include avoiding greasy, spicy, fried, tomato based, citrus foods, and be mindful that caffeine, carbonated drinks, chocolate and alcohol can increase reflux symptoms. Stay upright 2-3 hours after eating, prior to lying down and avoid eating late in the evenings. Please avoid NSAIDs (advil, aleve, naproxen, goody powder, ibuprofen, diclofenac) as these can be very hard on your GI tract, causing inflammation, ulcers and damage to the lining of your GI tract.   We will reassess symptoms in 6-8 weeks, if you are still having issues we may need to consider repeating an upper endoscopy

## 2022-03-09 NOTE — Telephone Encounter (Signed)
error 

## 2022-03-11 ENCOUNTER — Other Ambulatory Visit: Payer: Self-pay | Admitting: Family Medicine

## 2022-03-11 DIAGNOSIS — H8110 Benign paroxysmal vertigo, unspecified ear: Secondary | ICD-10-CM

## 2022-03-11 DIAGNOSIS — R42 Dizziness and giddiness: Secondary | ICD-10-CM

## 2022-03-15 ENCOUNTER — Ambulatory Visit (INDEPENDENT_AMBULATORY_CARE_PROVIDER_SITE_OTHER): Payer: Medicaid Other | Admitting: Family Medicine

## 2022-03-15 ENCOUNTER — Encounter: Payer: Self-pay | Admitting: Family Medicine

## 2022-03-15 ENCOUNTER — Other Ambulatory Visit (HOSPITAL_COMMUNITY)
Admission: RE | Admit: 2022-03-15 | Discharge: 2022-03-15 | Disposition: A | Discharge status: Home / Self Care | Payer: Medicaid Other | Source: Ambulatory Visit | Attending: Family Medicine | Admitting: Family Medicine

## 2022-03-15 VITALS — BP 142/97 | HR 74 | Temp 97.9°F | Ht <= 58 in | Wt 136.4 lb

## 2022-03-15 DIAGNOSIS — J329 Chronic sinusitis, unspecified: Secondary | ICD-10-CM

## 2022-03-15 DIAGNOSIS — E1129 Type 2 diabetes mellitus with other diabetic kidney complication: Secondary | ICD-10-CM

## 2022-03-15 DIAGNOSIS — E1159 Type 2 diabetes mellitus with other circulatory complications: Secondary | ICD-10-CM

## 2022-03-15 DIAGNOSIS — B20 Human immunodeficiency virus [HIV] disease: Secondary | ICD-10-CM | POA: Diagnosis not present

## 2022-03-15 DIAGNOSIS — Z0000 Encounter for general adult medical examination without abnormal findings: Secondary | ICD-10-CM

## 2022-03-15 DIAGNOSIS — Z0001 Encounter for general adult medical examination with abnormal findings: Secondary | ICD-10-CM

## 2022-03-15 DIAGNOSIS — Z124 Encounter for screening for malignant neoplasm of cervix: Secondary | ICD-10-CM | POA: Insufficient documentation

## 2022-03-15 DIAGNOSIS — F410 Panic disorder [episodic paroxysmal anxiety] without agoraphobia: Secondary | ICD-10-CM

## 2022-03-15 DIAGNOSIS — R809 Proteinuria, unspecified: Secondary | ICD-10-CM

## 2022-03-15 DIAGNOSIS — I152 Hypertension secondary to endocrine disorders: Secondary | ICD-10-CM

## 2022-03-15 DIAGNOSIS — Z21 Asymptomatic human immunodeficiency virus [HIV] infection status: Secondary | ICD-10-CM | POA: Diagnosis not present

## 2022-03-15 DIAGNOSIS — R102 Pelvic and perineal pain: Secondary | ICD-10-CM

## 2022-03-15 DIAGNOSIS — F4321 Adjustment disorder with depressed mood: Secondary | ICD-10-CM

## 2022-03-15 DIAGNOSIS — B3731 Acute candidiasis of vulva and vagina: Secondary | ICD-10-CM

## 2022-03-15 DIAGNOSIS — F411 Generalized anxiety disorder: Secondary | ICD-10-CM

## 2022-03-15 DIAGNOSIS — H1013 Acute atopic conjunctivitis, bilateral: Secondary | ICD-10-CM

## 2022-03-15 LAB — MICROSCOPIC EXAMINATION
RBC, Urine: NONE SEEN /hpf (ref 0–2)
Renal Epithel, UA: NONE SEEN /hpf

## 2022-03-15 LAB — URINALYSIS, ROUTINE W REFLEX MICROSCOPIC
Bilirubin, UA: NEGATIVE
Glucose, UA: NEGATIVE
Nitrite, UA: NEGATIVE
Specific Gravity, UA: 1.025 (ref 1.005–1.030)
Urobilinogen, Ur: 1 mg/dL (ref 0.2–1.0)
pH, UA: 6 (ref 5.0–7.5)

## 2022-03-15 LAB — BAYER DCA HB A1C WAIVED: HB A1C (BAYER DCA - WAIVED): 5.4 % (ref 4.8–5.6)

## 2022-03-15 MED ORDER — FLUOXETINE HCL 40 MG PO CAPS: 40.0000 mg | ORAL_CAPSULE | Freq: Every day | ORAL | 3 refills | Status: DC

## 2022-03-15 MED ORDER — AMLODIPINE BESYLATE 2.5 MG PO TABS: 2.5000 mg | ORAL_TABLET | Freq: Every day | ORAL | 3 refills | Status: DC

## 2022-03-15 MED ORDER — OLOPATADINE HCL 0.1 % OP SOLN: 1.0000 [drp] | Freq: Every day | OPHTHALMIC | 99 refills | Status: DC

## 2022-03-15 MED ORDER — HYDROXYZINE PAMOATE 25 MG PO CAPS: ORAL_CAPSULE | ORAL | 1 refills | Status: DC

## 2022-03-15 MED ORDER — LORATADINE 10 MG PO TABS: 10.0000 mg | ORAL_TABLET | Freq: Every day | ORAL | 99 refills | Status: DC

## 2022-03-15 MED ORDER — FLUCONAZOLE 150 MG PO TABS: 150.0000 mg | ORAL_TABLET | Freq: Once | ORAL | 0 refills | Status: AC

## 2022-03-15 NOTE — Progress Notes (Signed)
Ashley [name] is a 57 y.o. female presents to office today for annual physical exam examination.    Concerns today include: 1. Type 2 Diabetes with hypertension, hyperlipidemia:  Diabetes has been diet controlled.  She notes that she has been having some visual irritation due to feel like her eyes are dry and itchy.  Sometimes she feels like this is the case in her ears as well.  She is not sure if that she has maybe an allergy to her eyelash glue.  She takes antihistamines and uses a nasal spray.  Last eye exam: UTD Last foot exam: needs Last A1c:  Lab Results  Component Value Date   HGBA1C 5.0 07/05/2021   Nephropathy screen indicated?: UTD Last flu, zoster and/or pneumovax:  Immunization History  Administered Date(s) Administered   Hepatitis A 02/18/2002, 04/22/2002, 11/06/2005   Hepatitis B 02/18/2002, 04/22/2002, 11/06/2005   Influenza Split 12/19/2011   Influenza,inj,Quad PF,6+ Mos 02/02/2015, 12/17/2015, 12/09/2018, 12/22/2019, 01/04/2021   PNEUMOCOCCAL CONJUGATE-20 04/14/2021   Pneumococcal Conjugate-13 10/17/2017   Pneumococcal Polysaccharide-23 11/06/2005, 06/11/2009   Rabies, IM 04/19/2014, 04/22/2014, 05/09/2014   Td 07/18/1988   Tdap 11/06/2005    ROS: No chest pain, shortness of breath, dizziness or falls  Marital status: Single, Substance use: Smokes marijuana Diet: Typical American, Exercise: No structured Last eye exam: Does not have an eye doctor Last colonoscopy: Due 08/16/2024 Last mammogram: Needs Last pap smear: Needs yearly Refills needed today: All sent to [facility] Immunizations needed: Declines shingles vaccination Immunization History  Administered Date(s) Administered   Hepatitis A 02/18/2002, 04/22/2002, 11/06/2005   Hepatitis B 02/18/2002, 04/22/2002, 11/06/2005   Influenza Split 12/19/2011   Influenza,inj,Quad PF,6+ Mos 02/02/2015, 12/17/2015, 12/09/2018, 12/22/2019, 01/04/2021   PNEUMOCOCCAL CONJUGATE-20 04/14/2021   Pneumococcal  Conjugate-13 10/17/2017   Pneumococcal Polysaccharide-23 11/06/2005, 06/11/2009   Rabies, IM 04/19/2014, 04/22/2014, 05/09/2014   Td 07/18/1988   Tdap 11/06/2005     Past Medical History:  Diagnosis Date   Ankle fracture    Anxiety    Asthma    Collagen vascular disease (HCC)    COPD (chronic obstructive pulmonary disease) ([place])    Depression    Diabetes mellitus    Elevated LFTs 01/13/2012   GERD (gastroesophageal reflux disease)    Hepatitis C    Hernia    HIV (human immunodeficiency virus infection) ([place])    HIV (human immunodeficiency virus infection) ([place])    Hypertension    Hypothyroidism    Pinched nerve    Scoliosis    TB (tuberculosis)    Social History   Socioeconomic History   Marital status: Single    Spouse name: Not on file   Number of children: 2   Years of education: Not on file   Highest education level: Not on file  Occupational History   Occupation: Disability  Tobacco Use   Smoking status: Every Day    Packs/day: 1.00    Years: 35.00    Total pack years: 35.00    Types: Cigarettes    Passive exposure: Current   Smokeless tobacco: Never   Tobacco comments:    1 pack every 2-3 days  Vaping Use   Vaping Use: Some days  Substance and Sexual Activity   Alcohol use: Not Currently    Alcohol/week: 3.0 standard drinks of alcohol    Types: 3 Cans of beer per week   Drug use: Yes    Types: Marijuana    Comment: last cocaine was 07/15/2017.   Sexual activity: Not  Currently    Birth control/protection: Condom, None    Comment: patient given condoms  Other Topics Concern   Not on file  Social History Narrative   Pt lives in an apartment by herself.  Pt stated that she is married and that her husband is in a [employer], and that husband is being released yesterday or today.  Asked several times who Pt's psychiatric provider is.   Social Determinants of Health   Financial Resource Strain: Not on file  Food Insecurity: Not on file   Transportation Needs: Not on file  Physical Activity: Not on file  Stress: Not on file  Social Connections: Not on file  Intimate Partner Violence: Not on file   Past Surgical History:  Procedure Laterality Date   BIOPSY  05/08/2018   Procedure: biopsy;  Surgeon: Rogene Houston, MD;  [facility];  Service: Endoscopy;;  proximal transverse colon polyp   BIOPSY  10/22/2019   Procedure: BIOPSY;  Surgeon: Rogene Houston, MD;  [facility];  Service: Endoscopy;;   BIOPSY  07/29/2021   Procedure: BIOPSY;  Surgeon: Harvel Quale, MD;  [facility]: [facility];  Service: Gastroenterology;;   CESAREAN SECTION     CHOLECYSTECTOMY     COLONOSCOPY N/A 05/08/2018   Procedure: COLONOSCOPY;  Surgeon: Rogene Houston, MD;  [facility];  Service: Endoscopy;  Laterality: N/A;  2:00   COLONOSCOPY WITH PROPOFOL N/A 07/29/2021   Procedure: COLONOSCOPY WITH PROPOFOL;  Surgeon: Harvel Quale, MD;  [facility];  Service: Gastroenterology;  Laterality: N/A;  1245   ESOPHAGEAL DILATION  10/22/2019   Procedure: ESOPHAGEAL DILATION;  Surgeon: Rogene Houston, MD;  [facility];  Service: Endoscopy;;   ESOPHAGOGASTRODUODENOSCOPY (EGD) WITH PROPOFOL N/A 10/22/2019   Procedure: ESOPHAGOGASTRODUODENOSCOPY (EGD) WITH PROPOFOL;  Surgeon: Rogene Houston, MD;  [facility];  Service: Endoscopy;  Laterality: N/A;  1220   ESOPHAGOGASTRODUODENOSCOPY (EGD) WITH PROPOFOL N/A 07/29/2021   Procedure: ESOPHAGOGASTRODUODENOSCOPY (EGD) WITH PROPOFOL;  Surgeon: Harvel Quale, MD;  [facility];  Service: Gastroenterology;  Laterality: N/A;   HERNIA REPAIR     x2   INCISIONAL HERNIA REPAIR N/A 09/30/2018   Procedure: OPEN HERNIA REPAIR INCISIONAL WITH MESH;  Surgeon: Virl Cagey, MD;  Location: [facility];  Service: General;  Laterality: N/A;   LIVER BIOPSY     Family History  Problem Relation Age of Onset   Diabetes  Mother    Hypertension Mother    Stroke Mother        died 10/08/2013  Cancer Sister    Cancer Maternal Aunt     Current Outpatient Medications:    acetaminophen (TYLENOL) 500 MG tablet, Take 1 tablet (500 mg total) by mouth every 6 (six) hours as needed (pain.)., Disp: 30 tablet, Rfl: 0   albuterol (VENTOLIN HFA) 108 (90 Base) MCG/ACT inhaler, INHALE TWO PUFFS INTO THE LUNGS EVERY 6 HOURS AS NEEDED FOR WHEEZING OR SHORTNESS OF BREATH, Disp: 18 g, Rfl: 2   amLODipine (NORVASC) 2.5 MG tablet, Take 1 tablet (2.5 mg total) by mouth daily. For blood pressure (Patient taking differently: Take 2.5 mg by mouth at bedtime. For blood pressure), Disp: 90 tablet, Rfl: 3   bictegravir-emtricitabine-tenofovir AF (BIKTARVY) 50-200-25 MG TABS tablet, Take 1 tablet by mouth daily. (Patient taking differently: Take 1 tablet by mouth every evening.), Disp: 30 tablet, Rfl: 11   diclofenac (VOLTAREN) 75 MG EC  tablet, Take 1 tablet (75 mg total) by mouth 2 (two) times daily., Disp: 30 tablet, Rfl: 0   diclofenac Sodium (VOLTAREN) 1 % GEL, Apply 4 g topically 4 (four) times daily as needed (knee pain/ arthritis)., Disp: 300 g, Rfl: 2   FLUoxetine (PROZAC) 40 MG capsule, Take 1 capsule (40 mg total) by mouth daily., Disp: 90 capsule, Rfl: 0   fluticasone (FLONASE) 50 MCG/ACT nasal spray, Place 2 sprays into both nostrils daily., Disp: 16 g, Rfl: 6   guaiFENesin (MUCINEX) 600 MG 12 hr tablet, Take 1 tablet (600 mg total) by mouth 2 (two) times daily., Disp: 30 tablet, Rfl: 0   hydrOXYzine (VISTARIL) 25 MG capsule, TAKE 1 CAPSULE(25 MG) BY MOUTH EVERY 8 HOURS AS NEEDED FOR ANXIETY, Disp: 90 capsule, Rfl: 1   loratadine (ALLERGY RELIEF) 10 MG tablet, Take 1 tablet (10 mg total) by mouth daily., Disp: 90 tablet, Rfl: 1   meclizine (ANTIVERT) 25 MG tablet, TAKE ONE TABLET BY MOUTH THREE TIMES DAILY AS NEEDED FOR DIZZINESS, Disp: 90 tablet, Rfl: 0   mometasone (NASONEX) 50 MCG/ACT nasal spray, Place 2 sprays into the nose  daily. (Patient taking differently: Place 2 sprays into the nose daily as needed (allergies.).), Disp: 1 each, Rfl: 12   Multiple Vitamin (TAB-A-VITE) TABS, TAKE ONE TABLET BY MOUTH DAILY, Disp: 90 tablet, Rfl: 1   omeprazole (PRILOSEC) 40 MG capsule, Take 1 capsule (40 mg total) by mouth 2 (two) times daily., Disp: 120 capsule, Rfl: 3   ondansetron (ZOFRAN-ODT) 4 MG disintegrating tablet, '4mg'$  ODT q4 hours prn nausea/vomit, Disp: 12 tablet, Rfl: 0   sucralfate (CARAFATE) 1 GM/10ML suspension, TAKE 10 MLS BY MOUTH FOUR TIMES DAILY WITH MEALS AND AT BEDTIME, Disp: 420 mL, Rfl: 1   tiZANidine (ZANAFLEX) 4 MG tablet, TAKE 1/2 TO 1 TABLET BY MOUTH EVERY 8 HOURS AS NEEDED FOR MUSCLE SPASMS. USE SPARINGLY., Disp: 30 tablet, Rfl: 1  Allergies  Allergen Reactions   Aspirin Other (See Comments)    Liver problems   Tomato Other (See Comments)    Acid reflux.     ROS: Review of Systems Pertinent items noted in HPI and remainder of comprehensive ROS otherwise negative.    Physical exam BP (!) 142/97   Pulse 74   Temp 97.9 F (36.6 C)   Ht '4\' 7"'$  (1.397 m)   Wt 136 lb 6.4 oz (61.9 kg)   SpO2 94%   BMI 31.70 kg/m  General appearance: alert, cooperative, and appears stated age Head: Normocephalic, without obvious abnormality, atraumatic Eyes: negative findings: lids and lashes normal, conjunctivae and sclerae normal, corneas clear, pupils equal, round, reactive to light and accomodation, and quite a bit of eyelash glue on the lids Ears: normal TM's and external ear canals both ears Nose: Nares normal. Septum midline. Mucosa normal. No drainage or sinus tenderness. Throat: lips, mucosa, and tongue normal; teeth and gums normal Neck: no adenopathy, supple, symmetrical, trachea midline, and thyroid not enlarged, symmetric, no tenderness/mass/nodules Back: symmetric, no curvature. ROM normal. No CVA tenderness. Lungs: clear to auscultation bilaterally Heart: regular rate and rhythm, S1, S2 normal,  no murmur, click, rub or gallop Abdomen: soft, non-tender; bowel sounds normal; no masses,  no organomegaly Pelvic: cervix normal in appearance, external genitalia normal, no adnexal masses or tenderness, no cervical motion tenderness, rectovaginal septum normal, uterus normal size, shape, and consistency, and vagina normal without discharge Extremities: extremities normal, atraumatic, no cyanosis or edema Pulses: 2+ and symmetric Skin: Skin color, texture, turgor  normal. No rashes or lesions Lymph nodes: Cervical, supraclavicular, and axillary nodes normal. Neurologic: Grossly normal Psych: Very vibrant personality     03/15/2022   10:31 AM 10/26/2021    9:50 AM 10/13/2021   10:30 AM  Depression screen PHQ 2/9  Decreased Interest 2 2 0  Down, Depressed, Hopeless 1 2 0  PHQ - 2 Score 3 4 0  Altered sleeping 0 1   Tired, decreased energy 1 2   Change in appetite 1 2   Feeling bad or failure about yourself  0 2   Trouble concentrating 1 2   Moving slowly or fidgety/restless 1 2   Suicidal thoughts 0 0   PHQ-9 Score 7 15   Difficult doing work/chores Not difficult at all Somewhat difficult       03/15/2022   10:31 AM 10/26/2021    9:51 AM 08/08/2021   10:32 AM 07/05/2021   10:32 AM  GAD 7 : Generalized Anxiety Score  Nervous, Anxious, on Edge '1 2 2 1  '$ Control/stop worrying '1 2 2 2  '$ Worry too much - different things '1 2 2 2  '$ Trouble relaxing 0 '2 2 1  '$ Restless '1 2 2 1  '$ Easily annoyed or irritable '1 2 2 1  '$ Afraid - awful might happen 1 0 2 1  Total GAD 7 Score '6 12 14 9  '$ Anxiety Difficulty Somewhat difficult Somewhat difficult Somewhat difficult Somewhat difficult    Assessment/ Plan: [name] here for annual physical exam.   Annual physical exam  Type 2 diabetes mellitus with microalbuminuria, without long-term current use of insulin (HCC) - Plan: Bayer DCA Hb A1c Waived, CMP14+EGFR, LDL Cholesterol, Direct, Microalbumin / creatinine urine ratio  Hypertension associated  with diabetes (HCC) - Plan: CMP14+EGFR, amLODipine (NORVASC) 2.5 MG tablet  Human immunodeficiency virus (HIV) disease (HCC) - Plan: Cytology - PAP, CMP14+EGFR, CBC  Screening for cervical cancer - Plan: Cytology - PAP  Pelvic pain - Plan: Urinalysis, Routine w reflex microscopic  Allergic conjunctivitis of both eyes - Plan: olopatadine (PATADAY) 0.1 % ophthalmic solution  Grief reaction - Plan: FLUoxetine (PROZAC) 40 MG capsule, hydrOXYzine (VISTARIL) 25 MG capsule  Generalized anxiety disorder with panic attacks - Plan: FLUoxetine (PROZAC) 40 MG capsule, hydrOXYzine (VISTARIL) 25 MG capsule  Rhinosinusitis - Plan: loratadine (ALLERGY RELIEF) 10 MG tablet  Declines shingles vaccination.  Up-to-date on colonoscopy.  Pap smear collected today given history of HIV.  Sugar remains controlled.  Urine microalbumin collected.  Nonfasting so direct LDL collected  Blood pressure persistently out of range despite recheck.  I would like her to have blood pressure rechecked in 2 weeks.  Medications have been renewed.  Check CMP, CBC.  Keep follow-up with infectious disease  Urinalysis collected today.  Pelvic exam unremarkable  Prozac and hydroxyzine renewed.  Claritin sent to pharmacy.  Pataday sent for allergic conjunctivitis.  Okay to use sweet oil and/or mineral oil applied scantly to external auditory canals if needed for itching   Counseled on healthy lifestyle choices, including diet (rich in fruits, vegetables and lean meats and low in salt and simple carbohydrates) and exercise (at least 30 minutes of moderate physical activity daily).     Lyndi Holbein M. Lajuana Ripple, DO

## 2022-03-15 NOTE — Patient Instructions (Signed)
Call your gastric doctor and have them reroute your prescriptions for your stomach to the [facility].  They sent it to [place] in [place].   Health Maintenance, Female Adopting a healthy lifestyle and getting preventive care are important in promoting health and wellness. Ask your health care provider about: The right schedule for you to have regular tests and exams. Things you can do on your own to prevent diseases and keep yourself healthy. What should I know about diet, weight, and exercise? Eat a healthy diet  Eat a diet that includes plenty of vegetables, fruits, low-fat dairy products, and lean protein. Do not eat a lot of foods that are high in solid fats, added sugars, or sodium. Maintain a healthy weight Body mass index (BMI) is used to identify weight problems. It estimates body fat based on height and weight. Your health care provider can help determine your BMI and help you achieve or maintain a healthy weight. Get regular exercise Get regular exercise. This is one of the most important things you can do for your health. Most adults should: Exercise for at least 150 minutes each week. The exercise should increase your heart rate and make you sweat (moderate-intensity exercise). Do strengthening exercises at least twice a week. This is in addition to the moderate-intensity exercise. Spend less time sitting. Even light physical activity can be beneficial. Watch cholesterol and blood lipids Have your blood tested for lipids and cholesterol at 59 years of age, then have this test every 5 years. Have your cholesterol levels checked more often if: Your lipid or cholesterol levels are high. You are older than 59 years of age. You are at high risk for heart disease. What should I know about cancer screening? Depending on your health history and family history, you may need to have cancer screening at various ages. This may include screening for: Breast cancer. Cervical  cancer. Colorectal cancer. Skin cancer. Lung cancer. What should I know about heart disease, diabetes, and high blood pressure? Blood pressure and heart disease High blood pressure causes heart disease and increases the risk of stroke. This is more likely to develop in people who have high blood pressure readings or are overweight. Have your blood pressure checked: Every 3-5 years if you are 59-45 years of age. Every year if you are 59 years old or older. Diabetes Have regular diabetes screenings. This checks your fasting blood sugar level. Have the screening done: Once every three years after age 5 if you are at a normal weight and have a low risk for diabetes. More often and at a younger age if you are overweight or have a high risk for diabetes. What should I know about preventing infection? Hepatitis B If you have a higher risk for hepatitis B, you should be screened for this virus. Talk with your health care provider to find out if you are at risk for hepatitis B infection. Hepatitis C Testing is recommended for: Everyone born from [birth year] through 1967. Anyone with known risk factors for hepatitis C. Sexually transmitted infections (STIs) Get screened for STIs, including gonorrhea and chlamydia, if: You are sexually active and are younger than 59 years of age. You are older than 59 years of age and your health care provider tells you that you are at risk for this type of infection. Your sexual activity has changed since you were last screened, and you are at increased risk for chlamydia or gonorrhea. Ask your health care provider if you are at risk.  Ask your health care provider about whether you are at high risk for HIV. Your health care provider may recommend a prescription medicine to help prevent HIV infection. If you choose to take medicine to prevent HIV, you should first get tested for HIV. You should then be tested every 3 months for as long as you are taking the  medicine. Pregnancy If you are about to stop having your period (premenopausal) and you may become pregnant, seek counseling before you get pregnant. Take 400 to 800 micrograms (mcg) of folic acid every day if you become pregnant. Ask for birth control (contraception) if you want to prevent pregnancy. Osteoporosis and menopause Osteoporosis is a disease in which the bones lose minerals and strength with aging. This can result in bone fractures. If you are 80 years old or older, or if you are at risk for osteoporosis and fractures, ask your health care provider if you should: Be screened for bone loss. Take a calcium or vitamin D supplement to lower your risk of fractures. Be given hormone replacement therapy (HRT) to treat symptoms of menopause. Follow these instructions at home: Alcohol use Do not drink alcohol if: Your health care provider tells you not to drink. You are pregnant, may be pregnant, or are planning to become pregnant. If you drink alcohol: Limit how much you have to: 0-1 drink a day. Know how much alcohol is in your drink. In the [place] one drink equals one 12 oz bottle of beer (355 mL), one 5 oz glass of wine (148 mL), or one 1 oz glass of hard liquor (44 mL). Lifestyle Do not use any products that contain nicotine or tobacco. These products include cigarettes, chewing tobacco, and vaping devices, such as e-cigarettes. If you need help quitting, ask your health care provider. Do not use street drugs. Do not share needles. Ask your health care provider for help if you need support or information about quitting drugs. General instructions Schedule regular health, dental, and eye exams. Stay current with your vaccines. Tell your health care provider if: You often feel depressed. You have ever been abused or do not feel safe at home. Summary Adopting a healthy lifestyle and getting preventive care are important in promoting health and wellness. Follow your health care  provider's instructions about healthy diet, exercising, and getting tested or screened for diseases. Follow your health care provider's instructions on monitoring your cholesterol and blood pressure. This information is not intended to replace advice given to you by your health care provider. Make sure you discuss any questions you have with your health care provider. Document Revised: 07/05/2020 Document Reviewed: 07/05/2020 Elsevier Patient Education  [place].

## 2022-03-15 NOTE — Addendum Note (Signed)
Addended by: Janora Norlander on: 03/15/2022 01:01 PM   Modules accepted: Orders

## 2022-03-16 ENCOUNTER — Other Ambulatory Visit: Payer: Self-pay | Admitting: Internal Medicine

## 2022-03-16 DIAGNOSIS — B20 Human immunodeficiency virus [HIV] disease: Secondary | ICD-10-CM

## 2022-03-16 LAB — CMP14+EGFR
ALT: 21 IU/L (ref 0–32)
AST: 33 IU/L (ref 0–40)
Albumin/Globulin Ratio: 2 (ref 1.2–2.2)
Albumin: 4.5 g/dL (ref 3.8–4.9)
Alkaline Phosphatase: 85 IU/L (ref 44–121)
BUN/Creatinine Ratio: 8 — ABNORMAL LOW (ref 9–23)
BUN: 10 mg/dL (ref 6–24)
Bilirubin Total: 0.2 mg/dL (ref 0.0–1.2)
CO2: 26 mmol/L (ref 20–29)
Calcium: 9.5 mg/dL (ref 8.7–10.2)
Chloride: 101 mmol/L (ref 96–106)
Creatinine, Ser: 1.2 mg/dL — ABNORMAL HIGH (ref 0.57–1.00)
Globulin, Total: 2.2 g/dL (ref 1.5–4.5)
Glucose: 82 mg/dL (ref 70–99)
Potassium: 4.7 mmol/L (ref 3.5–5.2)
Sodium: 140 mmol/L (ref 134–144)
Total Protein: 6.7 g/dL (ref 6.0–8.5)
eGFR: 52 mL/min/{1.73_m2} — ABNORMAL LOW (ref >59)

## 2022-03-16 LAB — CBC
Hematocrit: 34.7 % (ref 34.0–46.6)
Hemoglobin: 11.5 g/dL (ref 11.1–15.9)
MCH: 31.7 pg (ref 26.6–33.0)
MCHC: 33.1 g/dL (ref 31.5–35.7)
MCV: 96 fL (ref 79–97)
Platelets: 173 10*3/uL (ref 150–450)
RBC: 3.63 x10E6/uL — ABNORMAL LOW (ref 3.77–5.28)
RDW: 14 % (ref 11.7–15.4)
WBC: 4.7 10*3/uL (ref 3.4–10.8)

## 2022-03-16 LAB — LDL CHOLESTEROL, DIRECT: LDL Direct: 126 mg/dL — ABNORMAL HIGH (ref 0–99)

## 2022-03-16 LAB — MICROALBUMIN / CREATININE URINE RATIO
Creatinine, Urine: 240.6 mg/dL
Microalb/Creat Ratio: 131 mg/g creat — ABNORMAL HIGH (ref 0–29)
Microalbumin, Urine: 316 ug/mL

## 2022-03-17 ENCOUNTER — Other Ambulatory Visit: Payer: Self-pay | Admitting: Family Medicine

## 2022-03-17 DIAGNOSIS — R809 Proteinuria, unspecified: Secondary | ICD-10-CM

## 2022-03-17 DIAGNOSIS — I152 Hypertension secondary to endocrine disorders: Secondary | ICD-10-CM

## 2022-03-17 LAB — CYTOLOGY - PAP
Chlamydia: NEGATIVE
Comment: NEGATIVE
Comment: NEGATIVE
Comment: NORMAL
Diagnosis: NEGATIVE
High risk HPV: NEGATIVE
Neisseria Gonorrhea: NEGATIVE

## 2022-03-17 MED ORDER — LOSARTAN POTASSIUM 25 MG PO TABS: 25.0000 mg | ORAL_TABLET | Freq: Every day | ORAL | 3 refills | Status: DC

## 2022-03-21 ENCOUNTER — Telehealth: Payer: Self-pay

## 2022-03-21 NOTE — Telephone Encounter (Signed)
Chest Pain: Patient complains of chest pain. Onset was  30  minutes ago, with unchanged course since that time. The patient describes the pain as intermittent, pressure like in nature, does not radiate. Patient rates pain as a 5/10 in intensity.  Associated symptoms are none. Aggravating factors are none.  Alleviating factors are: none. Patient's cardiac risk factors are diabetes mellitus, family history of premature cardiovascular disease, hypertension, obesity (BMI >= 30 kg/m2), and sedentary lifestyle.  Patient's risk factors for DVT/PE: none. Previous cardiac testing: electrocardiogram (ECG).   Patient reports that she fell about 30 minutes ago in her apartment and since then she has been having chest pain.  Patient was advised to call 911 for evaluation and possible transport to ER.  Patient voices understanding.

## 2022-04-09 ENCOUNTER — Other Ambulatory Visit: Payer: Self-pay | Admitting: Family Medicine

## 2022-04-09 DIAGNOSIS — H8110 Benign paroxysmal vertigo, unspecified ear: Secondary | ICD-10-CM

## 2022-04-09 DIAGNOSIS — R42 Dizziness and giddiness: Secondary | ICD-10-CM

## 2022-04-10 ENCOUNTER — Encounter: Payer: Self-pay | Admitting: Family Medicine

## 2022-04-10 ENCOUNTER — Ambulatory Visit: Payer: Medicaid Other | Admitting: Nurse Practitioner

## 2022-04-20 ENCOUNTER — Encounter: Payer: Self-pay | Admitting: Family Medicine

## 2022-04-25 ENCOUNTER — Ambulatory Visit: Payer: Medicaid Other | Admitting: Internal Medicine

## 2022-04-25 ENCOUNTER — Other Ambulatory Visit (INDEPENDENT_AMBULATORY_CARE_PROVIDER_SITE_OTHER): Payer: Self-pay | Admitting: Gastroenterology

## 2022-04-25 NOTE — Telephone Encounter (Signed)
Last seen 03/06/22

## 2022-05-06 ENCOUNTER — Other Ambulatory Visit: Payer: Self-pay | Admitting: Family Medicine

## 2022-05-06 DIAGNOSIS — J439 Emphysema, unspecified: Secondary | ICD-10-CM

## 2022-05-07 ENCOUNTER — Other Ambulatory Visit: Payer: Self-pay | Admitting: Internal Medicine

## 2022-05-07 DIAGNOSIS — B20 Human immunodeficiency virus [HIV] disease: Secondary | ICD-10-CM

## 2022-05-11 ENCOUNTER — Encounter (INDEPENDENT_AMBULATORY_CARE_PROVIDER_SITE_OTHER): Payer: Self-pay | Admitting: Gastroenterology

## 2022-05-11 ENCOUNTER — Ambulatory Visit (INDEPENDENT_AMBULATORY_CARE_PROVIDER_SITE_OTHER): Payer: Medicaid Other | Admitting: Gastroenterology

## 2022-05-11 VITALS — BP 152/95 | HR 91 | Temp 98.3°F | Ht <= 58 in | Wt 137.0 lb

## 2022-05-11 DIAGNOSIS — R112 Nausea with vomiting, unspecified: Secondary | ICD-10-CM

## 2022-05-11 DIAGNOSIS — K219 Gastro-esophageal reflux disease without esophagitis: Secondary | ICD-10-CM | POA: Diagnosis not present

## 2022-05-11 NOTE — Progress Notes (Addendum)
Referring Provider: Janora Norlander, DO Primary Care Physician:  Janora Norlander, DO Primary GI Physician: Dr. Jenetta Downer   Chief Complaint  Patient presents with   Gastroesophageal Reflux    Follow up on gerd, nausea and vomiting. Still having nausea. Has not vomited in about one month.    HPI:   Ashley [name] is a 57 y.o. female with past medical history of HIV on Biktarvy, asthma, COPD, diabetes, collagen vascular disease, hypothyroidism, hepatitis C (achieved SVR), depression.   Patient presenting today for follow up of GERD/Nausea  Last seen January 2024, at that time still having nausea, vomiting, heartburn, epigastric discomfort. Symptoms improved with change to omeprazole. Nausea twice a week. Taking carafate 1g QID   Recommended to continue omeprazole BID, continue carafate 1g QID, reflux precautions, consider repeat EGD if symptoms not improving.  Today, Patient reports she is feeling much better. She has some occasional intermittent abdominal pain, epigastric and sometimes lower, not precipitated by anything specific. Pain is improved with use of carafate. Having much less frequent heartburn taking omeprazole 40mg  twice a day. She is still having some nausea, maybe 2-3x/week. She notes that certain foods seem to make her feel nauseated. She has occasional vomiting. She is not taking any nausea medications. Appetite is improved, she has actually gained weight. She is trying to avoid trigger foods, not drinking alcohol. She is still smoking cigarettes, maybe 1 pack over 2 days. She is taking diclofenac BID   Last Colonoscopy:07/2021- Perianal skin tags found on perianal exam.                           - Two 3 to 5 mm polyps in the ascending colon,                            removed with a cold snare. Resected and retrieved.                           - One 3 mm polyp in the sigmoid colon, removed with                            a cold snare. Complete resection. Polyp  tissue not                            retrieved.                           - Non-bleeding internal hemorrhoids. (2 TAs)  Last Endoscopy:-07/2021 1 cm hiatal hernia.                           - Normal stomach. Biopsied-normal                            - Duodenitis.  Recommendations:  Repeat colonoscopy 3 years   Past Medical History:  Diagnosis Date   Ankle fracture    Anxiety    Asthma    Collagen vascular disease (HCC)    COPD (chronic obstructive pulmonary disease) ([place])    Depression    Diabetes mellitus    Elevated LFTs 01/13/2012   GERD (gastroesophageal reflux disease)  Hepatitis C    Hernia    HIV (human immunodeficiency virus infection) ([place])    HIV (human immunodeficiency virus infection) ([place])    Hypertension    Hypothyroidism    Pinched nerve    Scoliosis    TB (tuberculosis)     Past Surgical History:  Procedure Laterality Date   BIOPSY  05/08/2018   Procedure: biopsy;  Surgeon: Rogene Houston, MD;  [facility];  Service: Endoscopy;;  proximal transverse colon polyp   BIOPSY  10/22/2019   Procedure: BIOPSY;  Surgeon: Rogene Houston, MD;  [facility];  Service: Endoscopy;;   BIOPSY  07/29/2021   Procedure: BIOPSY;  Surgeon: Harvel Quale, MD;  [facility]: [facility];  Service: Gastroenterology;;   CESAREAN SECTION     CHOLECYSTECTOMY     COLONOSCOPY N/A 05/08/2018   Procedure: COLONOSCOPY;  Surgeon: Rogene Houston, MD;  [facility];  Service: Endoscopy;  Laterality: N/A;  2:00   COLONOSCOPY WITH PROPOFOL N/A 07/29/2021   Procedure: COLONOSCOPY WITH PROPOFOL;  Surgeon: Harvel Quale, MD;  [facility];  Service: Gastroenterology;  Laterality: N/A;  1245   ESOPHAGEAL DILATION  10/22/2019   Procedure: ESOPHAGEAL DILATION;  Surgeon: Rogene Houston, MD;  [facility];  Service: Endoscopy;;   ESOPHAGOGASTRODUODENOSCOPY (EGD) WITH PROPOFOL N/A 10/22/2019   Procedure:  ESOPHAGOGASTRODUODENOSCOPY (EGD) WITH PROPOFOL;  Surgeon: Rogene Houston, MD;  [facility];  Service: Endoscopy;  Laterality: N/A;  1220   ESOPHAGOGASTRODUODENOSCOPY (EGD) WITH PROPOFOL N/A 07/29/2021   Procedure: ESOPHAGOGASTRODUODENOSCOPY (EGD) WITH PROPOFOL;  Surgeon: Harvel Quale, MD;  [facility];  Service: Gastroenterology;  Laterality: N/A;   HERNIA REPAIR     x2   INCISIONAL HERNIA REPAIR N/A 09/30/2018   Procedure: OPEN HERNIA REPAIR INCISIONAL WITH MESH;  Surgeon: Virl Cagey, MD;  Location: [facility];  Service: General;  Laterality: N/A;   LIVER BIOPSY      Current Outpatient Medications  Medication Sig Dispense Refill   acetaminophen (TYLENOL) 500 MG tablet Take 1 tablet (500 mg total) by mouth every 6 (six) hours as needed (pain.). 30 tablet 0   albuterol (VENTOLIN HFA) 108 (90 Base) MCG/ACT inhaler INHALE TWO PUFFS INTO THE LUNGS EVERY 6 HOURS AS NEEDED FOR WHEEZING OR SHORTNESS OF BREATH 18 g 2   amLODipine (NORVASC) 2.5 MG tablet Take 1 tablet (2.5 mg total) by mouth daily. For blood pressure 90 tablet 3   bictegravir-emtricitabine-tenofovir AF (BIKTARVY) 50-200-25 MG TABS tablet TAKE ONE TABLET BY MOUTH DAILY 30 tablet 0   diclofenac (VOLTAREN) 75 MG EC tablet Take 1 tablet (75 mg total) by mouth 2 (two) times daily. 30 tablet 0   diclofenac Sodium (VOLTAREN) 1 % GEL Apply 4 g topically 4 (four) times daily as needed (knee pain/ arthritis). 300 g 2   FLUoxetine (PROZAC) 40 MG capsule Take 1 capsule (40 mg total) by mouth daily. 90 capsule 3   hydrOXYzine (VISTARIL) 25 MG capsule TAKE 1 CAPSULE(25 MG) BY MOUTH EVERY 8 HOURS AS NEEDED FOR ANXIETY 90 capsule 1   loratadine (ALLERGY RELIEF) 10 MG tablet Take 1 tablet (10 mg total) by mouth daily. 90 tablet PRN   losartan (COZAAR) 25 MG tablet Take 1 tablet (25 mg total) by mouth daily. For blood pressure 90 tablet 3   meclizine (ANTIVERT) 25 MG tablet TAKE ONE TABLET BY MOUTH THREE TIMES DAILY  AS NEEDED FOR DIZZINESS 90 tablet  2   mometasone (NASONEX) 50 MCG/ACT nasal spray Place 2 sprays into the nose daily. (Patient taking differently: Place 2 sprays into the nose daily as needed (allergies.).) 1 each 12   Multiple Vitamin (TAB-A-VITE) TABS TAKE ONE TABLET BY MOUTH DAILY 90 tablet 1   olopatadine (PATADAY) 0.1 % ophthalmic solution Place 1 drop into both eyes daily. For allergy 5 mL PRN   omeprazole (PRILOSEC) 40 MG capsule Take 1 capsule (40 mg total) by mouth 2 (two) times daily. 120 capsule 3   ondansetron (ZOFRAN-ODT) 4 MG disintegrating tablet 4mg  ODT q4 hours prn nausea/vomit 12 tablet 0   sucralfate (CARAFATE) 1 GM/10ML suspension TAKE 10 MLS BY MOUTH FOUR TIMES DAILY WITH MEALS AND AT BEDTIME 420 mL 1   tiZANidine (ZANAFLEX) 4 MG tablet TAKE 1/2 TO 1 TABLET BY MOUTH EVERY 8 HOURS AS NEEDED FOR MUSCLE SPASMS. USE SPARINGLY. 30 tablet 1   No current facility-administered medications for this visit.    Allergies as of 05/11/2022 - Review Complete 05/11/2022  Allergen Reaction Noted   Aspirin Other (See Comments) 02/04/2011   Tomato Other (See Comments) 09/22/2019    Family History  Problem Relation Age of Onset   Diabetes Mother    Hypertension Mother    Stroke Mother        died Sep 23, 2013  Cancer Sister    Cancer Maternal Aunt     Social History   Socioeconomic History   Marital status: Single    Spouse name: Not on file   Number of children: 2   Years of education: Not on file   Highest education level: Not on file  Occupational History   Occupation: Disability  Tobacco Use   Smoking status: Every Day    Packs/day: 1.00    Years: 35.00    Additional pack years: 0.00    Total pack years: 35.00    Types: Cigarettes    Passive exposure: Current   Smokeless tobacco: Never   Tobacco comments:    1 pack every 2-3 days  Vaping Use   Vaping Use: Some days  Substance and Sexual Activity   Alcohol use: Not Currently    Alcohol/week: 3.0 standard drinks  of alcohol    Types: 3 Cans of beer per week   Drug use: Yes    Types: Marijuana    Comment: last cocaine was 07/15/2017.   Sexual activity: Not Currently    Birth control/protection: Condom, None    Comment: patient given condoms  Other Topics Concern   Not on file  Social History Narrative   Pt lives in an apartment by herself.  Pt stated that she is married and that her husband is in a [employer], and that husband is being released yesterday or today.  Asked several times who Pt's psychiatric provider is.   Social Determinants of Health   Financial Resource Strain: Not on file  Food Insecurity: Not on file  Transportation Needs: Not on file  Physical Activity: Not on file  Stress: Not on file  Social Connections: Not on file    Review of systems General: negative for malaise, night sweats, fever, chills, weight loss Neck: Negative for lumps, goiter, pain and significant neck swelling Resp: Negative for cough, wheezing, dyspnea at rest CV: Negative for chest pain, leg swelling, palpitations, orthopnea GI: denies melena, hematochezia, diarrhea, constipation, dysphagia, odyonophagia, early satiety or unintentional weight loss. +nausea/vomiting +abdominal pain MSK: Negative for joint pain or swelling, back pain, and muscle  pain. Derm: Negative for itching or rash Psych: Denies depression, anxiety, memory loss, confusion. No homicidal or suicidal ideation.  Heme: Negative for prolonged bleeding, bruising easily, and swollen nodes. Endocrine: Negative for cold or heat intolerance, polyuria, polydipsia and goiter. Neuro: negative for tremor, gait imbalance, syncope and seizures. The remainder of the review of systems is noncontributory.  Physical Exam: There were no vitals taken for this visit. General:   Alert and oriented. No distress noted. Pleasant and cooperative.  Head:  Normocephalic and atraumatic. Eyes:  Conjuctiva clear without scleral icterus. Mouth:  Oral  mucosa pink and moist. Good dentition. No lesions. Heart: Normal rate and rhythm, s1 and s2 heart sounds present.  Lungs: Clear lung sounds in all lobes. Respirations equal and unlabored. Abdomen:  +BS, soft, non-tender and non-distended. No rebound or guarding. No HSM or masses noted. Derm: No palmar erythema or jaundice Msk:  Symmetrical without gross deformities. Normal posture. Extremities:  Without edema. Neurologic:  Alert and  oriented x4 Psych:  Alert and cooperative. Normal mood and affect.  Invalid input(s): "6 MONTHS"   ASSESSMENT: Ashley [name] is a 57 y.o. female presenting today for follow up of nausea.  Nausea/vomiting/epigastric pain/GERD: EGD in June 2023 with duodenitis, possibly contributing to her symptoms. She is doing better on PPI BID and carafate 1g QID. Is eating well and has actually gained some weight. Certain foods tend to make her symptoms worse. Query if nausea is influenced by her HIV/psych meds as she is on multiple medications known to cause nausea. Overall, she feels symptoms are much improved. She has minimal vomiting. Encouraged to continue with current medication regimen, avoid trigger foods and attempt to stop smoking. She should also avoid NSAIDs/try to limit diclofenac as much as possible. Could consider GES if nausea persists, though presentation is not necessarily typical of gastroparesis.     PLAN:  Continue PPI BID  2. Continue carafate 1g QID  3. Reflux precautions 4. Avoid trigger foods 5. May consider GES is nausea continues 6. Limit NSAID use/diclofenac  7. Smoking cessation   All questions were answered, patient verbalized understanding and is in agreement with plan as outlined above.    Follow Up: 3 months   Lashia Niese L. Jeanmarie Hubert, MSN, APRN, AGNP-C Adult-Gerontology Nurse Practitioner [facility]  I have reviewed the note and agree with the APP's assessment as described in this progress note  Katrinka Blazing, MD Gastroenterology and Hepatology [facility]

## 2022-05-11 NOTE — Patient Instructions (Signed)
Continue omeprazole '40mg'$  BID  Continue carafate 1g 4x/day   Avoid greasy, spicy, fried, citrus foods, and be mindful that caffeine, carbonated drinks, chocolate and alcohol can increase reflux symptoms Stay upright 2-3 hours after eating, prior to lying down and avoid eating late in the evenings.  Please avoid NSAIDs (advil, aleve, naproxen, goody powder, ibuprofen) as these can be very hard on your GI tract, causing inflammation, ulcers and damage to the lining of your GI tract. Be mindful that diclofenac is an NSAID and can also be damaging to the GI tract.  Follow up 3 months

## 2022-05-15 ENCOUNTER — Other Ambulatory Visit: Payer: Self-pay | Admitting: Family Medicine

## 2022-05-15 DIAGNOSIS — F410 Panic disorder [episodic paroxysmal anxiety] without agoraphobia: Secondary | ICD-10-CM

## 2022-05-15 DIAGNOSIS — F4321 Adjustment disorder with depressed mood: Secondary | ICD-10-CM

## 2022-05-16 ENCOUNTER — Ambulatory Visit: Payer: Medicaid Other | Admitting: Internal Medicine

## 2022-05-19 ENCOUNTER — Ambulatory Visit: Payer: Medicaid Other

## 2022-05-22 ENCOUNTER — Ambulatory Visit: Payer: Medicaid Other

## 2022-05-23 ENCOUNTER — Ambulatory Visit: Payer: Medicaid Other

## 2022-05-23 VITALS — BP 125/87 | HR 63

## 2022-05-23 DIAGNOSIS — Z0130 Encounter for examination of blood pressure without abnormal findings: Secondary | ICD-10-CM

## 2022-05-23 NOTE — Progress Notes (Signed)
Patient here today for blood pressure check  Blood pressure is 125/87, pulse 63

## 2022-05-24 ENCOUNTER — Ambulatory Visit: Payer: Medicaid Other | Admitting: Internal Medicine

## 2022-05-29 ENCOUNTER — Other Ambulatory Visit: Payer: Medicaid Other

## 2022-05-29 ENCOUNTER — Other Ambulatory Visit: Payer: Self-pay

## 2022-05-29 DIAGNOSIS — B20 Human immunodeficiency virus [HIV] disease: Secondary | ICD-10-CM

## 2022-05-29 DIAGNOSIS — Z113 Encounter for screening for infections with a predominantly sexual mode of transmission: Secondary | ICD-10-CM

## 2022-05-31 ENCOUNTER — Other Ambulatory Visit: Payer: Self-pay | Admitting: Internal Medicine

## 2022-05-31 DIAGNOSIS — B20 Human immunodeficiency virus [HIV] disease: Secondary | ICD-10-CM

## 2022-05-31 LAB — T-HELPER CELL (CD4) - (RCID CLINIC ONLY)
CD4 % Helper T Cell: 40 % (ref 33–65)
CD4 T Cell Abs: 583 /uL (ref 400–1790)

## 2022-06-01 LAB — HIV-1 RNA QUANT-NO REFLEX-BLD
HIV 1 RNA Quant: 74 Copies/mL — ABNORMAL HIGH
HIV-1 RNA Quant, Log: 1.87 Log cps/mL — ABNORMAL HIGH

## 2022-06-01 LAB — RPR: RPR Ser Ql: NONREACTIVE

## 2022-06-02 ENCOUNTER — Encounter: Payer: Self-pay | Admitting: Nurse Practitioner

## 2022-06-02 ENCOUNTER — Ambulatory Visit (INDEPENDENT_AMBULATORY_CARE_PROVIDER_SITE_OTHER): Payer: Medicaid Other | Admitting: Nurse Practitioner

## 2022-06-02 VITALS — BP 147/92 | HR 74 | Temp 97.9°F | Resp 20 | Ht <= 58 in | Wt 136.0 lb

## 2022-06-02 DIAGNOSIS — N898 Other specified noninflammatory disorders of vagina: Secondary | ICD-10-CM

## 2022-06-02 NOTE — Progress Notes (Signed)
Subjective:    Patient ID: Ashley [name], female    DOB: [birth year], 57 y.o.   MRN: [id number]   Chief Complaint: Vaginal Discharge   Patient comes in c/o vaginal discharge.  Vaginal Discharge The patient's primary symptoms include vaginal discharge. The patient's pertinent negatives include no pelvic pain. This is a new problem. The problem occurs constantly. The problem has been waxing and waning. The pain is mild. She is not pregnant. Pertinent negatives include no dysuria, frequency or urgency. The vaginal discharge was thick, yellow and copious. There has been no bleeding. She has not been passing clots. She has not been passing tissue. Nothing aggravates the symptoms. She has tried nothing for the symptoms. The treatment provided no relief. She is sexually active. It is unknown whether or not her partner has an STD.    She already has dx of HIV and hepatitis  Patient Active Problem List   Diagnosis Date Noted   Gastroesophageal reflux disease without esophagitis 03/06/2022   Duodenitis 09/05/2021   Pain of upper abdomen 06/27/2021   Abnormal CT scan, stomach 06/27/2021   Irritation of ear, left 10/12/2020   Routine screening for STI (sexually transmitted infection) 07/23/2019   Recurrent incisional hernia 09/24/2018   Rectus diastasis of lower abdomen 09/24/2018   Rectal bleeding 04/09/2018   Umbilical hernia without obstruction and without gangrene 12/11/2016   Pulmonary emphysema 12/17/2015   Liver fibrosis 11/11/2015   Substance abuse 06/24/2015   Atypical squamous cells of undetermined significance (ASCUS) on Papanicolaou smear of cervix on 03/26/15 04/02/2015   MDD (major depressive disorder), recurrent, severe, 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/30/2014   Nausea and vomiting 01/13/2012   Tobacco use 01/13/2012   Tuberculosis 12/19/2011   Hereditary and idiopathic peripheral neuropathy 06/16/2009   Hypertension associated with diabetes 03/19/2009   ABDOMINAL WALL HERNIA 03/19/2009    BACK PAIN, CHRONIC 03/19/2009   Human immunodeficiency virus (HIV) disease (HCC) 03/18/2009   Hepatitis C virus infection without hepatic coma 03/18/2009   Type 2 diabetes mellitus (HCC) 03/18/2009   Schizophrenia (HCC) 03/18/2009   BIPOLAR AFFECTIVE DISORDER, DEPRESSED, SEVERE 03/18/2009       Review of Systems  Genitourinary:  Positive for vaginal discharge. Negative for dysuria, frequency, pelvic pain and urgency.       Objective:   Physical Exam Constitutional:      Appearance: Normal appearance.  Cardiovascular:     Rate and Rhythm: Normal rate. Rhythm irregular.     Heart sounds: Normal heart sounds.  Pulmonary:     Effort: Pulmonary effort is normal.     Breath sounds: Normal breath sounds.  Skin:    General: Skin is warm and dry.  Neurological:     General: No focal deficit present.     Mental Status: She is alert and oriented to person, place, and time.  Psychiatric:        Mood and Affect: Mood normal.        Behavior: Behavior normal.     BP (!) 147/92   Pulse 74   Temp 97.9 F (36.6 C) (Temporal)   Resp 20   Ht 4\' 8"  (1.422 m)   Wt 136 lb (61.7 kg)   BMI 30.49 kg/m        Assessment & Plan:  Ashley [name] in today with chief complaint of Vaginal Discharge   1. Vaginal discharge Will call with results Safe sex encouraged - Ct Ng M genitalium NAA, Urine    The above assessment and management  plan was discussed with the patient. The patient verbalized understanding of and has agreed to the management plan. Patient is aware to call the clinic if symptoms persist or worsen. Patient is aware when to return to the clinic for a follow-up visit. Patient educated on when it is appropriate to go to the emergency department.   Mary-Margaret Daphine Deutscher, FNP

## 2022-06-02 NOTE — Patient Instructions (Signed)
Safe Sex Practicing safe sex means taking steps before and during sex to reduce your risk of: Getting an STI (sexually transmitted infection). Giving your partner an STI. Unwanted or unplanned pregnancy. How to practice safe sex Ways you can practice safe sex  Limit your sexual partners to only one partner who is having sex with only you. Avoid using alcohol and drugs before having sex. Alcohol and drugs can affect your judgment. Before having sex with a new partner: Talk to your partner about past partners, past STIs, and drug use. Get screened for STIs and discuss the results with your partner. Ask your partner to get screened too. Check your body regularly for sores, blisters, rashes, or unusual discharge. If you notice any of these problems, visit your health care provider. Avoid sexual contact if you have symptoms of an infection or you are being treated for an STI. While having sex, use a condom. Make sure to: Use a condom every time you have vaginal, oral, or anal sex. Both females and males should wear condoms during oral sex. Keep condoms in place from the beginning to the end of sexual activity. Use a latex condom, if possible. Latex condoms offer the best protection. Use only water-based lubricants with a condom. Using petroleum-based lubricants or oils will weaken the condom and increase the chance that it will break. Ways your health care provider can help you practice safe sex  See your health care provider for regular screenings, exams, and tests for STIs. Talk with your health care provider about what kind of birth control (contraception) is best for you. Get vaccinated against hepatitis B and human papillomavirus (HPV). If you are at risk of being infected with HIV (human immunodeficiency virus), talk with your health care provider about taking a prescription medicine to prevent HIV infection. You are at risk for HIV if you: Are a man who has sex with other men. Are  sexually active with more than one partner. Take drugs by injection. Have a sex partner who has HIV. Have unprotected sex. Have sex with someone who has sex with both men and women. Have had an STI. Follow these instructions at home: Take over-the-counter and prescription medicines only as told by your health care provider. Keep all follow-up visits. This is important. Where to find more information Centers for Disease Control and Prevention: www.cdc.gov Planned Parenthood: www.plannedparenthood.org Office on Women's Health: www.womenshealth.gov Summary Practicing safe sex means taking steps before and during sex to reduce your risk getting an STI, giving your partner an STI, and having an unwanted or unplanned pregnancy. Before having sex with a new partner, talk to your partner about past partners, past STIs, and drug use. Use a condom every time you have vaginal, oral, or anal sex. Both females and males should wear condoms during oral sex. Check your body regularly for sores, blisters, rashes, or unusual discharge. If you notice any of these problems, visit your health care provider. See your health care provider for regular screenings, exams, and tests for STIs. This information is not intended to replace advice given to you by your health care provider. Make sure you discuss any questions you have with your health care provider. Document Revised: 07/21/2019 Document Reviewed: 07/21/2019 Elsevier Patient Education  2023 Elsevier Inc.  

## 2022-06-05 ENCOUNTER — Other Ambulatory Visit: Payer: Self-pay | Admitting: Internal Medicine

## 2022-06-05 DIAGNOSIS — B20 Human immunodeficiency virus [HIV] disease: Secondary | ICD-10-CM

## 2022-06-05 LAB — CT NG M GENITALIUM NAA, URINE
Chlamydia trachomatis, NAA: NEGATIVE
Mycoplasma genitalium NAA: NEGATIVE
Neisseria gonorrhoeae, NAA: NEGATIVE

## 2022-06-06 ENCOUNTER — Ambulatory Visit: Payer: Medicaid Other | Admitting: Internal Medicine

## 2022-06-06 NOTE — Telephone Encounter (Signed)
Spoke with Ashley [name] regarding DDI between Solvay and Sucralfate, recommended she separate these two medications by about 6 hours to prevent decreased Biktarvy absorption. Patient verbalized understanding and has no further questions.   Sandie Ano, RN

## 2022-06-28 ENCOUNTER — Other Ambulatory Visit: Payer: Self-pay | Admitting: Family Medicine

## 2022-06-28 ENCOUNTER — Other Ambulatory Visit (INDEPENDENT_AMBULATORY_CARE_PROVIDER_SITE_OTHER): Payer: Self-pay | Admitting: Gastroenterology

## 2022-06-28 DIAGNOSIS — M5412 Radiculopathy, cervical region: Secondary | ICD-10-CM

## 2022-06-28 DIAGNOSIS — M170 Bilateral primary osteoarthritis of knee: Secondary | ICD-10-CM

## 2022-07-07 ENCOUNTER — Other Ambulatory Visit: Payer: Self-pay

## 2022-07-07 ENCOUNTER — Ambulatory Visit (INDEPENDENT_AMBULATORY_CARE_PROVIDER_SITE_OTHER): Payer: Medicaid Other | Admitting: Internal Medicine

## 2022-07-07 ENCOUNTER — Encounter: Payer: Self-pay | Admitting: Internal Medicine

## 2022-07-07 VITALS — BP 118/81 | HR 78 | Temp 97.6°F | Ht <= 58 in | Wt 138.0 lb

## 2022-07-07 DIAGNOSIS — B20 Human immunodeficiency virus [HIV] disease: Secondary | ICD-10-CM | POA: Diagnosis not present

## 2022-07-07 DIAGNOSIS — Z720 Tobacco use: Secondary | ICD-10-CM | POA: Diagnosis not present

## 2022-07-07 DIAGNOSIS — Z113 Encounter for screening for infections with a predominantly sexual mode of transmission: Secondary | ICD-10-CM | POA: Diagnosis not present

## 2022-07-07 MED ORDER — BIKTARVY 50-200-25 MG PO TABS: ORAL_TABLET | ORAL | 11 refills | Status: DC
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
## 2022-07-07 NOTE — Assessment & Plan Note (Signed)
Screened negative 

## 2022-07-07 NOTE — Assessment & Plan Note (Addendum)
Discussed importance of cessation  

## 2022-07-07 NOTE — Assessment & Plan Note (Signed)
She continues to do well with no new concerns.  Refills provided.  Labs reviewed with her and discussed the minimally detectable viral load.   She will follow up in 6 months.

## 2022-07-10 ENCOUNTER — Other Ambulatory Visit: Payer: Self-pay | Admitting: Family Medicine

## 2022-07-10 DIAGNOSIS — F4321 Adjustment disorder with depressed mood: Secondary | ICD-10-CM

## 2022-07-10 DIAGNOSIS — M5412 Radiculopathy, cervical region: Secondary | ICD-10-CM

## 2022-07-10 DIAGNOSIS — J439 Emphysema, unspecified: Secondary | ICD-10-CM

## 2022-07-10 DIAGNOSIS — R42 Dizziness and giddiness: Secondary | ICD-10-CM

## 2022-07-10 DIAGNOSIS — F410 Panic disorder [episodic paroxysmal anxiety] without agoraphobia: Secondary | ICD-10-CM

## 2022-07-10 DIAGNOSIS — H8110 Benign paroxysmal vertigo, unspecified ear: Secondary | ICD-10-CM

## 2022-07-12 ENCOUNTER — Other Ambulatory Visit: Payer: Self-pay | Admitting: Family Medicine

## 2022-07-12 DIAGNOSIS — H8110 Benign paroxysmal vertigo, unspecified ear: Secondary | ICD-10-CM

## 2022-07-12 DIAGNOSIS — R42 Dizziness and giddiness: Secondary | ICD-10-CM

## 2022-07-14 ENCOUNTER — Telehealth: Payer: Self-pay | Admitting: Family Medicine

## 2022-07-14 NOTE — Telephone Encounter (Signed)
I spoke to patient and told her 1 capsule a day of 40 mg is the max dose.  Patient verbalized understanding.  Patient is out of her fluoxetine. 90 tabs were sent on 05/15/2022

## 2022-07-14 NOTE — Telephone Encounter (Signed)
She cannot take it BID, She is on max dose at 40mg  already.  If she wants a referral to psychiatry, please let me know and I can place.

## 2022-07-14 NOTE — Telephone Encounter (Signed)
She will likely have to pay out of pocket.  She has refills but I doubt her insurance will cover an early refill.

## 2022-07-14 NOTE — Telephone Encounter (Signed)
Pt called requesting to speak with nurse directly about health concerns.

## 2022-07-14 NOTE — Telephone Encounter (Signed)
I spoke to patient.  She decided on her own to take her prozac 1 capsule BID because once a day was not helping.  Please advise

## 2022-07-17 NOTE — Telephone Encounter (Signed)
Patient aware and verbalized understanding. °

## 2022-07-30 ENCOUNTER — Other Ambulatory Visit: Payer: Self-pay | Admitting: Family Medicine

## 2022-07-30 DIAGNOSIS — F4321 Adjustment disorder with depressed mood: Secondary | ICD-10-CM

## 2022-07-30 DIAGNOSIS — F410 Panic disorder [episodic paroxysmal anxiety] without agoraphobia: Secondary | ICD-10-CM

## 2022-07-31 ENCOUNTER — Other Ambulatory Visit: Payer: Self-pay | Admitting: Family Medicine

## 2022-07-31 DIAGNOSIS — J439 Emphysema, unspecified: Secondary | ICD-10-CM

## 2022-09-02 ENCOUNTER — Other Ambulatory Visit: Payer: Self-pay | Admitting: Family Medicine

## 2022-09-02 DIAGNOSIS — J329 Chronic sinusitis, unspecified: Secondary | ICD-10-CM

## 2022-09-04 ENCOUNTER — Encounter (INDEPENDENT_AMBULATORY_CARE_PROVIDER_SITE_OTHER): Payer: Self-pay | Admitting: Gastroenterology

## 2022-09-04 ENCOUNTER — Ambulatory Visit (INDEPENDENT_AMBULATORY_CARE_PROVIDER_SITE_OTHER): Payer: Medicaid Other | Admitting: Gastroenterology

## 2022-09-13 ENCOUNTER — Encounter: Payer: Self-pay | Admitting: Family Medicine

## 2022-09-13 ENCOUNTER — Ambulatory Visit: Payer: Medicaid Other | Admitting: Family Medicine

## 2022-09-13 DIAGNOSIS — E1129 Type 2 diabetes mellitus with other diabetic kidney complication: Secondary | ICD-10-CM | POA: Insufficient documentation

## 2022-09-13 NOTE — Progress Notes (Deleted)
Subjective: CC:DM PCP: Raliegh Ip, DO WGN:FAOZH Ashley [name] is a 57 y.o. female presenting to clinic today for:  1. Type 2 Diabetes with hypertension, hyperlipidemia w/ CKD3a and microalbuminuria:  Diabetes is diet controlled. Losartan 25mg  added last visit for new CKD3a/ microalbuminuria. ***  Last eye exam: needs Last foot exam: needs Last A1c:  Lab Results  Component Value Date   HGBA1C 5.4 03/15/2022   Nephropathy screen indicated?: UTD Last flu, zoster and/or pneumovax:  Immunization History  Administered Date(s) Administered   Hepatitis A 02/18/2002, 04/22/2002, 11/06/2005   Hepatitis B 02/18/2002, 04/22/2002, 11/06/2005   Influenza Split 12/19/2011   Influenza,inj,Quad PF,6+ Mos 02/02/2015, 12/17/2015, 12/09/2018, 12/22/2019, 01/04/2021   PNEUMOCOCCAL CONJUGATE-20 04/14/2021   Pneumococcal Conjugate-13 10/17/2017   Pneumococcal Polysaccharide-23 11/06/2005, 06/11/2009   Rabies, IM 04/19/2014, 04/22/2014, 05/09/2014   Td 07/18/1988   Tdap 11/06/2005    ROS: ***dizziness, LOC, polyuria, polydipsia, unintended weight loss/gain, foot ulcerations, numbness or tingling in extremities, shortness of breath or chest pain.    ROS: Per HPI  Allergies  Allergen Reactions   Aspirin Other (See Comments)    Liver problems   Tomato Other (See Comments)    Acid reflux.   Past Medical History:  Diagnosis Date   Ankle fracture    Anxiety    Asthma    Collagen vascular disease (HCC)    COPD (chronic obstructive pulmonary disease) (HCC)    Depression    Diabetes mellitus    Elevated LFTs 01/13/2012   GERD (gastroesophageal reflux disease)    Hepatitis C    Hernia    HIV (human immunodeficiency virus infection) (HCC)    HIV (human immunodeficiency virus infection) (HCC)    Hypertension    Hypothyroidism    Pinched nerve    Scoliosis    TB (tuberculosis)     Current Outpatient Medications:    acetaminophen (TYLENOL) 500 MG tablet, Take 1 tablet (500 mg  total) by mouth every 6 (six) hours as needed (pain.)., Disp: 30 tablet, Rfl: 0   albuterol (VENTOLIN HFA) 108 (90 Base) MCG/ACT inhaler, INHALE TWO PUFFS INTO THE LUNGS EVERY 6 HOURS AS NEEDED FOR WHEEZING OR SHORTNESS OF BREATH, Disp: 18 g, Rfl: 1   ALLERGY RELIEF 10 MG tablet, TAKE ONE TABLET BY MOUTH EVERY DAY, Disp: 90 tablet, Rfl: 1   amLODipine (NORVASC) 2.5 MG tablet, Take 1 tablet (2.5 mg total) by mouth daily. For blood pressure, Disp: 90 tablet, Rfl: 3   bictegravir-emtricitabine-tenofovir AF (BIKTARVY) 50-200-25 MG TABS tablet, TAKE ONE TABLET BY MOUTH DAILY (NEEDS OFFICE VISIT FOR FURTHER REFILLS), Disp: 30 tablet, Rfl: 11   diclofenac (VOLTAREN) 75 MG EC tablet, Take 1 tablet (75 mg total) by mouth 2 (two) times daily., Disp: 30 tablet, Rfl: 0   diclofenac Sodium (VOLTAREN) 1 % GEL, APPLY 4 GRAMS TOPICALLY TO THE AFFECTED AREA FOUR TIMES DAILY AS NEEDED FOR KNEE PAIN OR ARTHRITIS, Disp: 1200 g, Rfl: 4   FLUoxetine (PROZAC) 40 MG capsule, TAKE ONE CAPSULE BY MOUTH EVERY DAY **dose increase**, Disp: 90 capsule, Rfl: 0   hydrOXYzine (VISTARIL) 25 MG capsule, TAKE 1 CAPSULE(25 MG) BY MOUTH EVERY 8 HOURS AS NEEDED FOR ANXIETY, Disp: 270 capsule, Rfl: 0   losartan (COZAAR) 25 MG tablet, Take 1 tablet (25 mg total) by mouth daily. For blood pressure, Disp: 90 tablet, Rfl: 3   meclizine (ANTIVERT) 25 MG tablet, Take 1 tablet (25 mg total) by mouth 3 (three) times daily as needed for dizziness. Use ONLY  if needed. Not intended for regular use., Disp: 270 tablet, Rfl: 0   mometasone (NASONEX) 50 MCG/ACT nasal spray, Place 2 sprays into the nose daily. (Patient taking differently: Place 2 sprays into the nose daily as needed (allergies.).), Disp: 1 each, Rfl: 12   Multiple Vitamin (TAB-A-VITE) TABS, TAKE ONE TABLET BY MOUTH DAILY, Disp: 90 tablet, Rfl: 11   olopatadine (PATADAY) 0.1 % ophthalmic solution, Place 1 drop into both eyes daily. For allergy, Disp: 5 mL, Rfl: PRN   omeprazole (PRILOSEC) 40  MG capsule, Take 1 capsule (40 mg total) by mouth 2 (two) times daily., Disp: 120 capsule, Rfl: 3   ondansetron (ZOFRAN-ODT) 4 MG disintegrating tablet, 4mg  ODT q4 hours prn nausea/vomit, Disp: 12 tablet, Rfl: 0   sucralfate (CARAFATE) 1 GM/10ML suspension, TAKE 10 MLS BY MOUTH FOUR TIMES DAILY WITH MEALS AND AT BEDTIME, Disp: 420 mL, Rfl: 2   tiZANidine (ZANAFLEX) 4 MG tablet, TAKE 1/2 TO 1 TABLET BY MOUTH EVERY 8 HOURS AS NEEDED FOR MUSCLE SPASMS. USE SPARINGLY., Disp: 270 tablet, Rfl: 1 Social History   Socioeconomic History   Marital status: Single    Spouse name: Not on file   Number of children: 2   Years of education: Not on file   Highest education level: Not on file  Occupational History   Occupation: Disability  Tobacco Use   Smoking status: Every Day    Current packs/day: 1.00    Average packs/day: 1 pack/day for 35.0 years (35.0 ttl pk-yrs)    Types: Cigarettes, E-cigarettes    Passive exposure: Current   Smokeless tobacco: Never   Tobacco comments:    1 pack every 2-3 days  Vaping Use   Vaping status: Some Days  Substance and Sexual Activity   Alcohol use: Not Currently    Alcohol/week: 3.0 standard drinks of alcohol    Types: 3 Cans of beer per week   Drug use: Yes    Types: Marijuana    Comment: last cocaine was 07/15/2017.   Sexual activity: Not Currently    Birth control/protection: Condom, None    Comment: patient given condoms  Other Topics Concern   Not on file  Social History Narrative   Pt lives in an apartment by herself.  Pt stated that she is married and that her husband is in a Engineer, agricultural, and that husband is being released yesterday or today.  Asked several times who Pt's psychiatric provider is.   Social Determinants of Health   Financial Resource Strain: Not on file  Food Insecurity: Not on file  Transportation Needs: Not on file  Physical Activity: Not on file  Stress: Not on file  Social Connections: Not on file  Intimate Partner  Violence: Not on file   Family History  Problem Relation Age of Onset   Diabetes Mother    Hypertension Mother    Stroke Mother        died 09-16-2013  Cancer Sister    Cancer Maternal Aunt     Objective: Office vital signs reviewed. There were no vitals taken for this visit.  Physical Examination:  General: Awake, alert, *** nourished, No acute distress HEENT: Normal    Neck: No masses palpated. No lymphadenopathy    Ears: Tympanic membranes intact, normal light reflex, no erythema, no bulging    Eyes: PERRLA, extraocular membranes intact, sclera ***    Nose: nasal turbinates moist, *** nasal discharge    Throat: moist mucus membranes, no erythema, *** tonsillar  exudate.  Airway is patent Cardio: regular rate and rhythm, S1S2 heard, no murmurs appreciated Pulm: clear to auscultation bilaterally, no wheezes, rhonchi or rales; normal work of breathing on room air GI: soft, non-tender, non-distended, bowel sounds present x4, no hepatomegaly, no splenomegaly, no masses GU: external vaginal tissue ***, cervix ***, *** punctate lesions on cervix appreciated, *** discharge from cervical os, *** bleeding, *** cervical motion tenderness, *** abdominal/ adnexal masses Extremities: warm, well perfused, No edema, cyanosis or clubbing; +*** pulses bilaterally MSK: *** gait and *** station Skin: dry; intact; no rashes or lesions Neuro: *** Strength and light touch sensation grossly intact, *** DTRs ***/4  Assessment/ Plan: 57 y.o. female   ***  No orders of the defined types were placed in this encounter.  No orders of the defined types were placed in this encounter.    Raliegh Ip, DO [facility] [phone]

## 2022-09-20 ENCOUNTER — Other Ambulatory Visit: Payer: Self-pay | Admitting: Family Medicine

## 2022-09-20 DIAGNOSIS — R42 Dizziness and giddiness: Secondary | ICD-10-CM

## 2022-09-20 DIAGNOSIS — H8110 Benign paroxysmal vertigo, unspecified ear: Secondary | ICD-10-CM

## 2022-09-27 ENCOUNTER — Other Ambulatory Visit (INDEPENDENT_AMBULATORY_CARE_PROVIDER_SITE_OTHER): Payer: Self-pay | Admitting: Gastroenterology

## 2022-09-27 ENCOUNTER — Telehealth (INDEPENDENT_AMBULATORY_CARE_PROVIDER_SITE_OTHER): Payer: Self-pay | Admitting: *Deleted

## 2022-09-27 NOTE — Telephone Encounter (Signed)
Pt notified per dr Levon Hedger to stop carafate as it can interfere with HIV med. Pt verbalized understanding.

## 2022-09-27 NOTE — Telephone Encounter (Signed)
I would advise stopping this as it can interfere with her HIV medication, will not refill

## 2022-09-27 NOTE — Telephone Encounter (Signed)
Last visit 05/11/22. Note states continue carafate and follow up in 3 months. No upcoming appt

## 2022-09-27 NOTE — Telephone Encounter (Signed)
Pt wanted to schedule her check up appt. States she missed the last one. She does not use mychart and wanted a call or a letter mailed with appt info. thanks

## 2022-10-01 ENCOUNTER — Other Ambulatory Visit: Payer: Self-pay | Admitting: Family Medicine

## 2022-10-01 DIAGNOSIS — J439 Emphysema, unspecified: Secondary | ICD-10-CM

## 2022-10-20 ENCOUNTER — Other Ambulatory Visit (INDEPENDENT_AMBULATORY_CARE_PROVIDER_SITE_OTHER): Payer: Self-pay | Admitting: Gastroenterology

## 2022-10-23 NOTE — Telephone Encounter (Signed)
Last seen 05/10/21

## 2022-11-02 ENCOUNTER — Ambulatory Visit (INDEPENDENT_AMBULATORY_CARE_PROVIDER_SITE_OTHER): Payer: Medicaid Other | Admitting: Gastroenterology

## 2022-11-05 ENCOUNTER — Other Ambulatory Visit (INDEPENDENT_AMBULATORY_CARE_PROVIDER_SITE_OTHER): Payer: Self-pay | Admitting: Gastroenterology

## 2022-11-09 ENCOUNTER — Other Ambulatory Visit: Payer: Self-pay | Admitting: Family Medicine

## 2022-11-09 DIAGNOSIS — F4321 Adjustment disorder with depressed mood: Secondary | ICD-10-CM

## 2022-11-09 DIAGNOSIS — F410 Panic disorder [episodic paroxysmal anxiety] without agoraphobia: Secondary | ICD-10-CM

## 2022-11-09 DIAGNOSIS — F4320 Adjustment disorder, unspecified: Secondary | ICD-10-CM

## 2022-11-09 DIAGNOSIS — J439 Emphysema, unspecified: Secondary | ICD-10-CM

## 2022-11-10 NOTE — Progress Notes (Signed)
The 10-year ASCVD risk score (Arnett DK, et al., 2019) is: 16%   Values used to calculate the score:     Age: 57 years     Sex: Female     Is Non-Hispanic African American: Yes     Diabetic: Yes     Tobacco smoker: Yes     Systolic Blood Pressure: 118 mmHg     Is BP treated: Yes     HDL Cholesterol: 91 mg/dL     Total Cholesterol: 198 mg/dL  Sandie Ano, RN

## 2022-11-28 ENCOUNTER — Other Ambulatory Visit: Payer: Self-pay | Admitting: Family Medicine

## 2022-11-28 DIAGNOSIS — F410 Panic disorder [episodic paroxysmal anxiety] without agoraphobia: Secondary | ICD-10-CM

## 2022-11-28 DIAGNOSIS — J439 Emphysema, unspecified: Secondary | ICD-10-CM

## 2022-11-28 DIAGNOSIS — F4321 Adjustment disorder with depressed mood: Secondary | ICD-10-CM

## 2022-12-03 ENCOUNTER — Other Ambulatory Visit: Payer: Self-pay | Admitting: Family Medicine

## 2022-12-03 DIAGNOSIS — F411 Generalized anxiety disorder: Secondary | ICD-10-CM

## 2022-12-03 DIAGNOSIS — H8110 Benign paroxysmal vertigo, unspecified ear: Secondary | ICD-10-CM

## 2022-12-03 DIAGNOSIS — R42 Dizziness and giddiness: Secondary | ICD-10-CM

## 2022-12-03 DIAGNOSIS — F4321 Adjustment disorder with depressed mood: Secondary | ICD-10-CM

## 2022-12-04 NOTE — Telephone Encounter (Signed)
Gottschalk pt NTBS 30-d given 11/09/22

## 2022-12-05 ENCOUNTER — Ambulatory Visit: Payer: Medicaid Other | Admitting: Family Medicine

## 2022-12-05 NOTE — Telephone Encounter (Signed)
Made first available appt in Jan

## 2022-12-13 ENCOUNTER — Other Ambulatory Visit: Payer: Self-pay | Admitting: Family Medicine

## 2022-12-13 DIAGNOSIS — F410 Panic disorder [episodic paroxysmal anxiety] without agoraphobia: Secondary | ICD-10-CM

## 2022-12-13 DIAGNOSIS — H8110 Benign paroxysmal vertigo, unspecified ear: Secondary | ICD-10-CM

## 2022-12-13 DIAGNOSIS — R42 Dizziness and giddiness: Secondary | ICD-10-CM

## 2022-12-13 DIAGNOSIS — F4321 Adjustment disorder with depressed mood: Secondary | ICD-10-CM

## 2022-12-19 ENCOUNTER — Ambulatory Visit (INDEPENDENT_AMBULATORY_CARE_PROVIDER_SITE_OTHER): Payer: Medicaid Other | Admitting: Family Medicine

## 2022-12-19 ENCOUNTER — Ambulatory Visit (INDEPENDENT_AMBULATORY_CARE_PROVIDER_SITE_OTHER): Payer: Medicaid Other

## 2022-12-19 ENCOUNTER — Encounter: Payer: Self-pay | Admitting: Family Medicine

## 2022-12-19 VITALS — Temp 98.7°F | Ht <= 58 in | Wt 136.8 lb

## 2022-12-19 DIAGNOSIS — M545 Low back pain, unspecified: Secondary | ICD-10-CM | POA: Diagnosis not present

## 2022-12-19 DIAGNOSIS — E119 Type 2 diabetes mellitus without complications: Secondary | ICD-10-CM

## 2022-12-19 DIAGNOSIS — M546 Pain in thoracic spine: Secondary | ICD-10-CM

## 2022-12-19 DIAGNOSIS — W19XXXA Unspecified fall, initial encounter: Secondary | ICD-10-CM

## 2022-12-19 DIAGNOSIS — M542 Cervicalgia: Secondary | ICD-10-CM

## 2022-12-19 LAB — LIPID PANEL
Chol/HDL Ratio: 2.9 ratio (ref 0.0–4.4)
Cholesterol, Total: 197 mg/dL (ref 100–199)
HDL: 68 mg/dL (ref >39)
LDL Chol Calc (NIH): 111 mg/dL — ABNORMAL HIGH (ref 0–99)
Triglycerides: 101 mg/dL (ref 0–149)
VLDL Cholesterol Cal: 18 mg/dL (ref 5–40)

## 2022-12-19 LAB — CMP14+EGFR
ALT: 47 [IU]/L — ABNORMAL HIGH (ref 0–32)
AST: 60 [IU]/L — ABNORMAL HIGH (ref 0–40)
Albumin: 4.1 g/dL (ref 3.8–4.9)
Alkaline Phosphatase: 92 [IU]/L (ref 44–121)
BUN/Creatinine Ratio: 14 (ref 9–23)
BUN: 14 mg/dL (ref 6–24)
Bilirubin Total: <0.2 mg/dL (ref 0.0–1.2)
CO2: 20 mmol/L (ref 20–29)
Calcium: 8.4 mg/dL — ABNORMAL LOW (ref 8.7–10.2)
Chloride: 107 mmol/L — ABNORMAL HIGH (ref 96–106)
Creatinine, Ser: 1.03 mg/dL — ABNORMAL HIGH (ref 0.57–1.00)
Globulin, Total: 2.4 g/dL (ref 1.5–4.5)
Glucose: 77 mg/dL (ref 70–99)
Potassium: 4.6 mmol/L (ref 3.5–5.2)
Sodium: 143 mmol/L (ref 134–144)
Total Protein: 6.5 g/dL (ref 6.0–8.5)
eGFR: 63 mL/min/{1.73_m2} (ref >59)

## 2022-12-19 LAB — BAYER DCA HB A1C WAIVED: HB A1C (BAYER DCA - WAIVED): 5.2 % (ref 4.8–5.6)

## 2022-12-19 NOTE — Progress Notes (Signed)
Subjective: ZO:XWRU PCP: Raliegh Ip, DO EAV:WUJWJ Ashley [name] is a 57 y.o. female presenting to clinic today for:  1.  Fall Patient reports that she sustained a mechanical fall 2 weeks ago.  She had some water on her laundry room floor and threw down a Chux pad.  What ever pair of shoes she had caused it to be more slippery when she stepped over it and she essentially did a split.  She notes she started developing some full body pain there after.  She has been mildly dizzy.  She has been utilizing her muscle relaxer which is written every 8 hours as needed 4 times a day instead.  She continues to take her Voltaren gel and has been utilizing heating pads and Tylenol and this has been helping some but she still having a lot of discomfort.  She just wanted to get checked out.  Reports that she did have some left upper extremity numbness and tingling at 1 point but that is since resolved.  She has history of recurrent falls dating back as far as 2021.  She has had chronic back issues intermittently as well.   ROS: Per HPI  Allergies  Allergen Reactions   Aspirin Other (See Comments)    Liver problems   Tomato Other (See Comments)    Acid reflux.   Past Medical History:  Diagnosis Date   Ankle fracture    Anxiety    Asthma    Collagen vascular disease (HCC)    COPD (chronic obstructive pulmonary disease) (HCC)    Depression    Diabetes mellitus    Elevated LFTs 01/13/2012   GERD (gastroesophageal reflux disease)    Hepatitis C    Hernia    HIV (human immunodeficiency virus infection) (HCC)    HIV (human immunodeficiency virus infection) (HCC)    Hypertension    Hypothyroidism    Pinched nerve    Scoliosis    TB (tuberculosis)     Current Outpatient Medications:    acetaminophen (TYLENOL) 500 MG tablet, Take 1 tablet (500 mg total) by mouth every 6 (six) hours as needed (pain.)., Disp: 30 tablet, Rfl: 0   albuterol (VENTOLIN HFA) 108 (90 Base) MCG/ACT inhaler, INHALE TWO  PUFFS INTO THE LUNGS EVERY 6 HOURS AS NEEDED FOR WHEEZING OR SHORTNESS OF BREATH **NEEDS TO BE SEEN BEFORE NEXT REFILL**, Disp: 18 g, Rfl: 0   ALLERGY RELIEF 10 MG tablet, TAKE ONE TABLET BY MOUTH EVERY DAY, Disp: 90 tablet, Rfl: 1   amLODipine (NORVASC) 2.5 MG tablet, Take 1 tablet (2.5 mg total) by mouth daily. For blood pressure, Disp: 90 tablet, Rfl: 3   bictegravir-emtricitabine-tenofovir AF (BIKTARVY) 50-200-25 MG TABS tablet, TAKE ONE TABLET BY MOUTH DAILY (NEEDS OFFICE VISIT FOR FURTHER REFILLS), Disp: 30 tablet, Rfl: 11   diclofenac (VOLTAREN) 75 MG EC tablet, Take 1 tablet (75 mg total) by mouth 2 (two) times daily., Disp: 30 tablet, Rfl: 0   diclofenac Sodium (VOLTAREN) 1 % GEL, APPLY 4 GRAMS TOPICALLY TO THE AFFECTED AREA FOUR TIMES DAILY AS NEEDED FOR KNEE PAIN OR ARTHRITIS, Disp: 1200 g, Rfl: 4   FLUoxetine (PROZAC) 40 MG capsule, Take 1 capsule (40 mg total) by mouth daily., Disp: 90 capsule, Rfl: 1   hydrOXYzine (VISTARIL) 25 MG capsule, TAKE 1 CAPSULE(25 MG) BY MOUTH EVERY 8 HOURS AS NEEDED FOR ANXIETY **NEEDS TO BE SEEN BEFORE NEXT REFILL**, Disp: 90 capsule, Rfl: 0   losartan (COZAAR) 25 MG tablet, Take 1 tablet (25  mg total) by mouth daily. For blood pressure, Disp: 90 tablet, Rfl: 3   meclizine (ANTIVERT) 25 MG tablet, TAKE ONE TABLET BY MOUTH THREE TIMES DAILY AS NEEDED FOR DIZZINESS, Disp: 90 tablet, Rfl: 0   mometasone (NASONEX) 50 MCG/ACT nasal spray, Place 2 sprays into the nose daily. (Patient taking differently: Place 2 sprays into the nose daily as needed (allergies.).), Disp: 1 each, Rfl: 12   Multiple Vitamin (TAB-A-VITE) TABS, TAKE ONE TABLET BY MOUTH DAILY, Disp: 90 tablet, Rfl: 11   olopatadine (PATADAY) 0.1 % ophthalmic solution, Place 1 drop into both eyes daily. For allergy, Disp: 5 mL, Rfl: PRN   omeprazole (PRILOSEC) 40 MG capsule, TAKE ONE CAPSULE BY MOUTH TWICE DAILY, Disp: 120 capsule, Rfl: 3   ondansetron (ZOFRAN-ODT) 4 MG disintegrating tablet, 4mg  ODT q4  hours prn nausea/vomit, Disp: 12 tablet, Rfl: 0   sucralfate (CARAFATE) 1 GM/10ML suspension, TAKE 10 MLS BY MOUTH FOUR TIMES DAILY WITH MEALS AND AT BEDTIME, Disp: 420 mL, Rfl: 5   tiZANidine (ZANAFLEX) 4 MG tablet, TAKE 1/2 TO 1 TABLET BY MOUTH EVERY 8 HOURS AS NEEDED FOR MUSCLE SPASMS. USE SPARINGLY., Disp: 270 tablet, Rfl: 1 Social History   Socioeconomic History   Marital status: Single    Spouse name: Not on file   Number of children: 2   Years of education: Not on file   Highest education level: Not on file  Occupational History   Occupation: Disability  Tobacco Use   Smoking status: Every Day    Current packs/day: 1.00    Average packs/day: 1 pack/day for 35.0 years (35.0 ttl pk-yrs)    Types: Cigarettes, E-cigarettes    Passive exposure: Current   Smokeless tobacco: Never   Tobacco comments:    1 pack every 2-3 days  Vaping Use   Vaping status: Some Days  Substance and Sexual Activity   Alcohol use: Not Currently    Alcohol/week: 3.0 standard drinks of alcohol    Types: 3 Cans of beer per week   Drug use: Yes    Types: Marijuana    Comment: last cocaine was 07/15/2017.   Sexual activity: Not Currently    Birth control/protection: Condom, None    Comment: patient given condoms  Other Topics Concern   Not on file  Social History Narrative   Pt lives in an apartment by herself.  Pt stated that she is married and that her husband is in a Engineer, agricultural, and that husband is being released yesterday or today.  Asked several times who Pt's psychiatric provider is.   Social Determinants of Health   Financial Resource Strain: Not on file  Food Insecurity: Not on file  Transportation Needs: Not on file  Physical Activity: Not on file  Stress: Not on file  Social Connections: Not on file  Intimate Partner Violence: Not on file   Family History  Problem Relation Age of Onset   Diabetes Mother    Hypertension Mother    Stroke Mother        died 20-Oct-2013   Cancer Sister    Cancer Maternal Aunt     Objective: Office vital signs reviewed. BP 138/89   Pulse 90   Temp 98.7 F (37.1 C)   Ht 4\' 8"  (1.422 m)   Wt 136 lb 12.8 oz (62.1 kg)   SpO2 100%   BMI 30.67 kg/m   Physical Examination:  General: Awake, alert, well nourished, No acute distress HEENT: sclera white, MMM, PERRL,  EOMI Cardio: regular rate and rhythm  Pulm: normal work of breathing on room air Extremities: warm, well perfused, No edema, cyanosis or clubbing; +2 pulses bilaterally MSK: normal gait and station.  She has midline tenderness outpatient along the C-spine, thoracic spine and lumbar spine with exquisite tenderness palpation over the lumbosacral junction bilaterally. Skin: dry; intact; no rashes or lesions Neuro: 5/5 UE and LE Strength and light touch sensation grossly intact, cranial nerves II through XII grossly intact.  Negative straight leg raise.  Orthostatic Vitals for the past 48 hrs (Last 6 readings):  Patient Position Orthostatic BP Orthostatic Pulse BP Location Cuff Size  12/19/22 0818 Sitting 138/89 90 Left Arm Normal  12/19/22 0855 Supine 131/82 88 Left Arm Normal  12/19/22 0857 Standing 129/83 97 Left Arm Normal   Assessment/ Plan: 57 y.o. female   Neck pain - Plan: DG Cervical Spine 2 or 3 views  Acute bilateral low back pain without sciatica - Plan: DG Lumbar Spine 2-3 Views  Acute bilateral thoracic back pain - Plan: DG Thoracic Spine 2 View  Fall, initial encounter - Plan: DG Cervical Spine 2 or 3 views, DG Thoracic Spine 2 View, DG Lumbar Spine 2-3 Views  Diet-controlled diabetes mellitus (HCC) - Plan: Bayer DCA Hb A1c Waived, CMP14+EGFR, Lipid Panel  Will obtain imaging of her entire spine as she was tender in the midline the whole way down.  She had full active range of motion and did not have any observed difficulty ambulating or getting up onto the table.  In fact she was able to hop on the table without difficulty.  Strength was  totally intact as is sensation.  No focal findings.  I counseled her on not overutilizing her medication as this can in fact increased risk of recurrent falls and dizziness.  Orthostatics were negative.  Check A1c, CMP, lipid panel that we did not discuss her diabetes in detail today.   Raliegh Ip, DO [facility] [phone]

## 2022-12-19 NOTE — Patient Instructions (Addendum)
Will contact you with the xray results once read. DO NOT take more than what is prescribed of your medications.  This can increase your risk of falls.  Orthostatic Hypotension Blood pressure is a measurement of how strongly, or weakly, your circulating blood is pressing against the walls of your arteries. Orthostatic hypotension is a drop in blood pressure that can happen when you change positions, such as when you go from lying down to standing. Arteries are blood vessels that carry blood from your heart throughout your body. When blood pressure is too low, you may not get enough blood to your brain or to the rest of your organs. Orthostatic hypotension can cause light-headedness, sweating, rapid heartbeat, blurred vision, and fainting. These symptoms require further investigation into the cause. What are the causes? Orthostatic hypotension can be caused by many things, including: Sudden changes in posture, such as standing up quickly after you have been sitting or lying down. Loss of blood (anemia) or loss of body fluids (dehydration). Heart problems, neurologic problems, or hormone problems. Pregnancy. Aging. The risk for this condition increases as you get older. Severe infection (sepsis). Certain medicines, such as medicines for high blood pressure or medicines that make the body lose excess fluids (diuretics). What are the signs or symptoms? Symptoms of this condition may include: Weakness, light-headedness, or dizziness. Sweating. Blurred vision. Tiredness (fatigue). Rapid heartbeat. Fainting, in severe cases. How is this diagnosed? This condition is diagnosed based on: Your symptoms and medical history. Your blood pressure measurements. Your health care provider will check your blood pressure when you are: Lying down. Sitting. Standing. A blood pressure reading is recorded as two numbers, such as "120 over 80" (or 120/80). The first ("top") number is called the systolic  pressure. It is a measure of the pressure in your arteries as your heart beats. The second ("bottom") number is called the diastolic pressure. It is a measure of the pressure in your arteries when your heart relaxes between beats. Blood pressure is measured in a unit called mmHg. Healthy blood pressure for most adults is 120/80 mmHg. Orthostatic hypotension is defined as a 20 mmHg drop in systolic pressure or a 10 mmHg drop in diastolic pressure within 3 minutes of standing. Other information or tests that may be used to diagnose orthostatic hypotension include: Your other vital signs, such as your heart rate and temperature. Blood tests. An electrocardiogram (ECG) or echocardiogram. A Holter monitor. This is a device you wear that records your heart rhythm continuously, usually for 24-48 hours. Tilt table test. For this test, you will be safely secured to a table that moves you from a lying position to an upright position. Your heart rhythm and blood pressure will be monitored during the test. How is this treated? This condition may be treated by: Changing your diet. This may involve eating more salt (sodium) or drinking more water. Changing the dosage of certain medicines you are taking that might be lowering your blood pressure. Correcting the underlying reason for the orthostatic hypotension. Wearing compression stockings. Taking medicines to raise your blood pressure. Avoiding actions that trigger symptoms. Follow these instructions at home: Medicines Take over-the-counter and prescription medicines only as told by your health care provider. Follow instructions from your health care provider about changing the dosage of your current medicines, if this applies. Do not stop or adjust any of your medicines on your own. Eating and drinking  Drink enough fluid to keep your urine pale yellow. Eat extra salt only as  directed. Do not add extra salt to your diet unless advised by your health care  provider. Eat frequent, small meals. Avoid standing up suddenly after eating. General instructions  Get up slowly from lying down or sitting positions. This gives your blood pressure a chance to adjust. Avoid hot showers and excessive heat as directed by your health care provider. Engage in regular physical activity as directed by your health care provider. If you have compression stockings, wear them as told. Keep all follow-up visits. This is important. Contact a health care provider if: You have a fever for more than 2-3 days. You feel more thirsty than usual. You feel dizzy or weak. Get help right away if: You have chest pain. You have a fast or irregular heartbeat. You become sweaty or feel light-headed. You feel short of breath. You faint. You have any symptoms of a stroke. "BE FAST" is an easy way to remember the main warning signs of a stroke: B - Balance. Signs are dizziness, sudden trouble walking, or loss of balance. E - Eyes. Signs are trouble seeing or a sudden change in vision. F - Face. Signs are sudden weakness or numbness of the face, or the face or eyelid drooping on one side. A - Arms. Signs are weakness or numbness in an arm. This happens suddenly and usually on one side of the body. S - Speech. Signs are sudden trouble speaking, slurred speech, or trouble understanding what people say. T - Time. Time to call emergency services. Write down what time symptoms started. You have other signs of a stroke, such as: A sudden, severe headache with no known cause. Nausea or vomiting. Seizure. These symptoms may represent a serious problem that is an emergency. Do not wait to see if the symptoms will go away. Get medical help right away. Call your local emergency services (911 in the [place] Do not drive yourself to the hospital. Summary Orthostatic hypotension is a sudden drop in blood pressure. It can cause light-headedness, sweating, rapid heartbeat, blurred vision, and  fainting. Orthostatic hypotension can be diagnosed by having your blood pressure taken while lying down, sitting, and then standing. Treatment may involve changing your diet, wearing compression stockings, sitting up slowly, adjusting your medicines, or correcting the underlying reason for the orthostatic hypotension. Get help right away if you have chest pain, a fast or irregular heartbeat, or symptoms of a stroke. This information is not intended to replace advice given to you by your health care provider. Make sure you discuss any questions you have with your health care provider. Document Revised: 04/29/2020 Document Reviewed: 04/29/2020 Elsevier Patient Education  2024 [employer] Cervical Radiculopathy  Cervical radiculopathy means that a nerve in the neck (a cervical nerve) is pinched or bruised. This can happen because of an injury to the cervical spine (vertebrae) in the neck, or as a normal part of getting older. This condition can cause pain or loss of feeling (numbness) that runs from your neck all the way down to your arm and fingers. Often, this condition gets better with rest. Treatment may be needed if the condition does not get better. What are the causes? A neck injury. A bulging disk in your spine. Sudden muscle tightening (muscle spasms). Tight muscles in your neck due to overuse. Arthritis. Breakdown in the bones and joints of the spine (spondylosis) due to getting older. Bone spurs that form near the nerves in the neck. What are the signs or symptoms? Pain. The pain may: Run  from the neck to the arm and hand. Be very bad or irritating. Get worse when you move your neck. Loss of feeling or tingling in your arm or hand. Weakness in your arm or hand, in very bad cases. How is this treated? In many cases, treatment is not needed for this condition. With rest, the condition often gets better over time. If treatment is needed, options may include: Wearing a soft neck  collar (cervical collar) for short periods of time. Doing exercises (physical therapy) to strengthen your neck muscles. Taking medicines. Having shots (injections) in your spine, in very bad cases. Having surgery. This may be needed if other treatments do not help. The type of surgery that is used will depend on the cause of your condition. Follow these instructions at home: If you have a soft neck collar: Wear it as told by your doctor. Take it off only as told by your doctor. Ask your doctor if you can take the collar off for cleaning and bathing. If you are allowed to take the collar off for cleaning or bathing: Follow instructions from your doctor about how to take off the collar safely. Clean the collar by wiping it with mild soap and water and drying it completely. Take out any removable pads in the collar every 1-2 days. Wash them by hand with soap and water. Let them air-dry completely before you put them back in the collar. Check your skin under the collar for redness or sores. If you see any, tell your doctor. Managing pain     Take over-the-counter and prescription medicines only as told by your doctor. If told, put ice on the painful area. To do this: If you have a soft neck collar, take if off as told by your doctor. Put ice in a plastic bag. Place a towel between your skin and the bag. Leave the ice on for 20 minutes, 2-3 times a day. Take off the ice if your skin turns bright red. This is very important. If you cannot feel pain, heat, or cold, you have a greater risk of damage to the area. If using ice does not help, you can try using heat. Use the heat source that your doctor recommends, such as a moist heat pack or a heating pad. Place a towel between your skin and the heat source. Leave the heat on for 20-30 minutes. Take off the heat if your skin turns bright red. This is very important. If you cannot feel pain, heat, or cold, you have a greater risk of getting  burned. You may try a gentle neck and shoulder rub (massage). Activity Rest as needed. Return to your normal activities when your doctor says that it is safe. Do exercises as told by your doctor or physical therapist. You may have to avoid lifting. Ask your doctor how much you can safely lift. General instructions Use a flat pillow when you sleep. Do not drive while wearing a soft neck collar. If you do not have a soft neck collar, ask your doctor if it is safe to drive while your neck heals. Ask your doctor if you should avoid driving or using machines while you are taking your medicine. Do not smoke or use any products that contain nicotine or tobacco. If you need help quitting, ask your doctor. Keep all follow-up visits. Contact a doctor if: Your condition does not get better with treatment. Get help right away if: Your pain gets worse and medicine does not help. You  lose feeling or feel weak in your hand, arm, face, or leg. You have a high fever. Your neck is stiff. You cannot control when you poop or pee (have incontinence). You have trouble with walking, balance, or talking. Summary Cervical radiculopathy means that a nerve in the neck is pinched or bruised. A nerve can get pinched from a bulging disk, arthritis, an injury to the neck, or other causes. Symptoms include pain, tingling, or loss of feeling that goes from the neck to the arm or hand. Weakness in your arm or hand can happen in very bad cases. Treatment may include resting, wearing a soft neck collar, and doing exercises. You might need to take medicines for pain. In very bad cases, shots or surgery may be needed. This information is not intended to replace advice given to you by your health care provider. Make sure you discuss any questions you have with your health care provider. Document Revised: 08/19/2020 Document Reviewed: 08/19/2020 Elsevier Patient Education  2024 [employer].

## 2023-01-02 ENCOUNTER — Encounter: Payer: Self-pay | Admitting: Internal Medicine

## 2023-01-02 ENCOUNTER — Other Ambulatory Visit: Payer: Self-pay

## 2023-01-02 ENCOUNTER — Ambulatory Visit: Payer: Medicaid Other | Admitting: Internal Medicine

## 2023-01-02 ENCOUNTER — Other Ambulatory Visit (HOSPITAL_COMMUNITY)
Admission: RE | Admit: 2023-01-02 | Discharge: 2023-01-02 | Disposition: A | Discharge status: Home / Self Care | Payer: Medicaid Other | Source: Ambulatory Visit | Attending: Internal Medicine | Admitting: Internal Medicine

## 2023-01-02 VITALS — BP 122/84 | HR 76 | Temp 98.3°F | Ht 59.0 in | Wt 143.0 lb

## 2023-01-02 DIAGNOSIS — B20 Human immunodeficiency virus [HIV] disease: Secondary | ICD-10-CM | POA: Diagnosis present

## 2023-01-02 DIAGNOSIS — Z23 Encounter for immunization: Secondary | ICD-10-CM | POA: Diagnosis not present

## 2023-01-02 DIAGNOSIS — Z113 Encounter for screening for infections with a predominantly sexual mode of transmission: Secondary | ICD-10-CM | POA: Diagnosis present

## 2023-01-02 NOTE — Progress Notes (Signed)
   Subjective:    Patient ID: Ashley [name], female    DOB: Dec 23, 1965, 57 y.o.   MRN: [id number]  HPI Ashley [name] is here for follow up of HIV She continues on Biktarvy with no missed doses.  No issues with getting or taking her medication. No concerns today.    Review of Systems  Constitutional:  Negative for fatigue.  Gastrointestinal:  Negative for diarrhea and nausea.  Skin:  Negative for rash.       Objective:   Physical Exam Eyes:     General: No scleral icterus. Pulmonary:     Effort: Pulmonary effort is normal.  Neurological:     Mental Status: She is alert.   SH: no tobacco        Assessment & Plan:

## 2023-01-02 NOTE — Assessment & Plan Note (Signed)
Discussed flu shot and given today 

## 2023-01-02 NOTE — Assessment & Plan Note (Signed)
She continues to do well with no changes indicated with [employer] today and rtc in 6 months.

## 2023-01-02 NOTE — Assessment & Plan Note (Signed)
Will screen 

## 2023-01-03 ENCOUNTER — Other Ambulatory Visit: Payer: Self-pay | Admitting: Family Medicine

## 2023-01-03 DIAGNOSIS — H8110 Benign paroxysmal vertigo, unspecified ear: Secondary | ICD-10-CM

## 2023-01-03 DIAGNOSIS — R42 Dizziness and giddiness: Secondary | ICD-10-CM

## 2023-01-03 DIAGNOSIS — J439 Emphysema, unspecified: Secondary | ICD-10-CM

## 2023-01-03 LAB — URINE CYTOLOGY ANCILLARY ONLY
Chlamydia: NEGATIVE
Comment: NEGATIVE
Comment: NORMAL
Neisseria Gonorrhea: NEGATIVE

## 2023-01-03 LAB — T-HELPER CELL (CD4) - (RCID CLINIC ONLY)
CD4 % Helper T Cell: 40 % (ref 33–65)
CD4 T Cell Abs: 657 /uL (ref 400–1790)

## 2023-01-04 LAB — HIV-1 RNA QUANT-NO REFLEX-BLD
HIV 1 RNA Quant: 63 {copies}/mL — ABNORMAL HIGH
HIV-1 RNA Quant, Log: 1.8 {Log_copies}/mL — ABNORMAL HIGH

## 2023-01-04 LAB — RPR: RPR Ser Ql: NONREACTIVE

## 2023-01-24 ENCOUNTER — Other Ambulatory Visit: Payer: Self-pay | Admitting: Family Medicine

## 2023-01-24 ENCOUNTER — Telehealth: Payer: Self-pay | Admitting: Family Medicine

## 2023-01-24 DIAGNOSIS — F4321 Adjustment disorder with depressed mood: Secondary | ICD-10-CM

## 2023-01-24 DIAGNOSIS — F410 Panic disorder [episodic paroxysmal anxiety] without agoraphobia: Secondary | ICD-10-CM

## 2023-01-24 DIAGNOSIS — J439 Emphysema, unspecified: Secondary | ICD-10-CM

## 2023-01-28 ENCOUNTER — Other Ambulatory Visit: Payer: Self-pay | Admitting: Family Medicine

## 2023-01-28 DIAGNOSIS — H8110 Benign paroxysmal vertigo, unspecified ear: Secondary | ICD-10-CM

## 2023-01-28 DIAGNOSIS — R42 Dizziness and giddiness: Secondary | ICD-10-CM

## 2023-01-29 NOTE — Telephone Encounter (Signed)
She should NOT be taking every day.  If she is having uncontrolled dizziness, we need to refer to neuro.  I can place order for one in [place].  Let me know if still dizzy

## 2023-02-02 ENCOUNTER — Ambulatory Visit: Payer: Medicaid Other | Admitting: Nurse Practitioner

## 2023-02-02 ENCOUNTER — Ambulatory Visit: Payer: Medicaid Other

## 2023-02-02 ENCOUNTER — Ambulatory Visit (INDEPENDENT_AMBULATORY_CARE_PROVIDER_SITE_OTHER): Payer: Medicaid Other

## 2023-02-02 DIAGNOSIS — R809 Proteinuria, unspecified: Secondary | ICD-10-CM | POA: Diagnosis not present

## 2023-02-02 DIAGNOSIS — E1129 Type 2 diabetes mellitus with other diabetic kidney complication: Secondary | ICD-10-CM | POA: Diagnosis not present

## 2023-02-02 LAB — HM DIABETES EYE EXAM

## 2023-02-02 NOTE — Progress Notes (Signed)
Ashley [name] arrived 02/02/2023 and has given verbal consent to obtain images and complete their overdue diabetic retinal screening.  The images have been sent to an ophthalmologist or optometrist for review and interpretation.  Results will be sent back to Raliegh Ip, DO for review.  Patient has been informed they will be contacted when we receive the results via telephone or MyChart

## 2023-02-16 ENCOUNTER — Other Ambulatory Visit: Payer: Self-pay | Admitting: Family Medicine

## 2023-02-16 DIAGNOSIS — R42 Dizziness and giddiness: Secondary | ICD-10-CM

## 2023-02-16 DIAGNOSIS — H8110 Benign paroxysmal vertigo, unspecified ear: Secondary | ICD-10-CM

## 2023-02-23 ENCOUNTER — Other Ambulatory Visit: Payer: Self-pay | Admitting: Family Medicine

## 2023-02-23 DIAGNOSIS — J329 Chronic sinusitis, unspecified: Secondary | ICD-10-CM

## 2023-02-23 DIAGNOSIS — J439 Emphysema, unspecified: Secondary | ICD-10-CM

## 2023-03-09 ENCOUNTER — Other Ambulatory Visit: Payer: Self-pay | Admitting: Family Medicine

## 2023-03-09 DIAGNOSIS — M5412 Radiculopathy, cervical region: Secondary | ICD-10-CM

## 2023-03-09 DIAGNOSIS — E1159 Type 2 diabetes mellitus with other circulatory complications: Secondary | ICD-10-CM

## 2023-03-09 DIAGNOSIS — R809 Proteinuria, unspecified: Secondary | ICD-10-CM

## 2023-03-12 ENCOUNTER — Other Ambulatory Visit: Payer: Self-pay | Admitting: Family Medicine

## 2023-03-12 ENCOUNTER — Other Ambulatory Visit (INDEPENDENT_AMBULATORY_CARE_PROVIDER_SITE_OTHER): Payer: Self-pay | Admitting: Gastroenterology

## 2023-03-12 DIAGNOSIS — R42 Dizziness and giddiness: Secondary | ICD-10-CM

## 2023-03-12 DIAGNOSIS — H8110 Benign paroxysmal vertigo, unspecified ear: Secondary | ICD-10-CM

## 2023-03-13 ENCOUNTER — Ambulatory Visit: Payer: Medicaid Other | Admitting: Family Medicine

## 2023-04-06 ENCOUNTER — Ambulatory Visit: Payer: Self-pay | Admitting: Family Medicine

## 2023-04-06 NOTE — Telephone Encounter (Signed)
 Copied from CRM [id number]. Topic: Clinical - Red Word Triage >> Apr 06, 2023  1:50 PM Deleta HERO wrote: Red Word that prompted transfer to Nurse Triage: PT states her knees, back, and arms are throbbing. She feels like her whole body is in pain and she is having trouble walking.  Chief Complaint: pain  Symptoms: pain to knees, back and arms, states had a fall 3 months ago Frequency: constant Pertinent Negatives: Patient denies fever, numbness, sob Disposition: [] ED /[] Urgent Care (no appt availability in office) / [x] Appointment(In office/virtual)/ []  [facility]/ [] Home Care/ [] Refused Recommended Disposition /[] [facility]/ []  Follow-up with PCP Additional Notes: states fell 3 months ago and did not see pcp.  Apt set for Monday.  Care advice given, denies questions, instructed to go to er if becomes worse.   Reason for Disposition  [1] Age > 50 AND [2] bilateral shoulder pains present > 2 weeks  Answer Assessment - Initial Assessment Questions 1. ONSET: When did the muscle aches or body pains start?      3 months ok 2. LOCATION: What part of your body is hurting? (e.g., entire body, arms, legs)      Knees back and arms, had a fall 3 months ago 3. SEVERITY: How bad is the pain? (Scale 1-10; or mild, moderate, severe)   - MILD (1-3): doesn't interfere with normal activities    - MODERATE (4-7): interferes with normal activities or awakens from sleep    - SEVERE (8-10):  excruciating pain, unable to do any normal activities      10/10 states 11/10 4. CAUSE: What do you think is causing the pains?     fall 5. FEVER: Have you been having fever?     denies 6. OTHER SYMPTOMS: Do you have any other symptoms? (e.g., chest pain, weakness, rash, cold or flu symptoms, weight loss)     Denies.  7. PREGNANCY: Is there any chance you are pregnant? When was your last menstrual period?     na 8. TRAVEL: Have you traveled out of the country in the last month?  (e.g., travel history, exposures)     na  Protocols used: Muscle Aches and Body Pain-A-AH

## 2023-04-09 ENCOUNTER — Ambulatory Visit: Payer: Medicaid Other | Admitting: Family Medicine

## 2023-04-10 ENCOUNTER — Ambulatory Visit: Payer: Medicaid Other | Admitting: Family Medicine

## 2023-04-24 ENCOUNTER — Ambulatory Visit (INDEPENDENT_AMBULATORY_CARE_PROVIDER_SITE_OTHER): Payer: Medicaid Other | Admitting: Gastroenterology

## 2023-05-17 ENCOUNTER — Other Ambulatory Visit (INDEPENDENT_AMBULATORY_CARE_PROVIDER_SITE_OTHER): Payer: Self-pay | Admitting: Gastroenterology

## 2023-05-28 ENCOUNTER — Other Ambulatory Visit: Payer: Self-pay | Admitting: Family Medicine

## 2023-05-28 DIAGNOSIS — J439 Emphysema, unspecified: Secondary | ICD-10-CM

## 2023-06-03 ENCOUNTER — Other Ambulatory Visit: Payer: Self-pay | Admitting: Family Medicine

## 2023-06-03 DIAGNOSIS — F4320 Adjustment disorder, unspecified: Secondary | ICD-10-CM

## 2023-06-03 DIAGNOSIS — F410 Panic disorder [episodic paroxysmal anxiety] without agoraphobia: Secondary | ICD-10-CM

## 2023-06-20 ENCOUNTER — Encounter: Payer: Medicaid Other | Admitting: Family Medicine

## 2023-06-20 ENCOUNTER — Encounter: Payer: Self-pay | Admitting: Family Medicine

## 2023-06-20 DIAGNOSIS — E785 Hyperlipidemia, unspecified: Secondary | ICD-10-CM | POA: Insufficient documentation

## 2023-06-20 NOTE — Progress Notes (Deleted)
 Ashley [name] is a 58 y.o. female presents to office today for annual physical exam examination.    Concerns today include: 1. Type 2 Diabetes (diet controlled) with hypertension, hyperlipidemia:  Glucometer:***.   High at home: ***; Low at home: ***, Taking medication(s): ***,.  Last eye exam: *** Last foot exam: *** Last A1c:  Lab Results  Component Value Date   HGBA1C 5.2 12/19/2022   Nephropathy screen indicated?: *** Last flu, zoster and/or pneumovax:  Immunization History  Administered Date(s) Administered   Hepatitis A 02/18/2002, 04/22/2002, 11/06/2005   Hepatitis B 02/18/2002, 04/22/2002, 11/06/2005   Influenza Split 12/19/2011   Influenza, Seasonal, Injecte, Preservative Fre 01/02/2023   Influenza,inj,Quad PF,6+ Mos 02/02/2015, 12/17/2015, 12/09/2018, 12/22/2019, 01/04/2021   PNEUMOCOCCAL CONJUGATE-20 04/14/2021   Pneumococcal Conjugate-13 10/17/2017   Pneumococcal Polysaccharide-23 11/06/2005, 06/11/2009   Rabies, IM 04/19/2014, 04/22/2014, 05/09/2014   Td 07/18/1988   Tdap 11/06/2005    ROS: ***dizziness, LOC, polyuria, polydipsia, unintended weight loss/gain, foot ulcerations, numbness or tingling in extremities, shortness of breath or chest pain.   Occupation: ***, Marital status: ***, Substance use: *** Health Maintenance Due  Topic Date Due   COVID-19 Vaccine (1) Never done   Zoster Vaccines- Shingrix (1 of 2) Never done   Lung Cancer Screening  Never done   FOOT EXAM  08/09/2018   Cervical Cancer Screening (Pap smear)  03/16/2023   Diabetic kidney evaluation - Urine ACR  03/16/2023   HEMOGLOBIN A1C  06/19/2023   Refills needed today: ***  Immunization History  Administered Date(s) Administered   Hepatitis A 02/18/2002, 04/22/2002, 11/06/2005   Hepatitis B 02/18/2002, 04/22/2002, 11/06/2005   Influenza Split 12/19/2011   Influenza, Seasonal, Injecte, Preservative Fre 01/02/2023   Influenza,inj,Quad PF,6+ Mos 02/02/2015, 12/17/2015,  12/09/2018, 12/22/2019, 01/04/2021   PNEUMOCOCCAL CONJUGATE-20 04/14/2021   Pneumococcal Conjugate-13 10/17/2017   Pneumococcal Polysaccharide-23 11/06/2005, 06/11/2009   Rabies, IM 04/19/2014, 04/22/2014, 05/09/2014   Td 07/18/1988   Tdap 11/06/2005   Past Medical History:  Diagnosis Date   Ankle fracture    Anxiety    Asthma    Collagen vascular disease (HCC)    COPD (chronic obstructive pulmonary disease) (HCC)    Depression    Diabetes mellitus    Elevated LFTs 01/13/2012   GERD (gastroesophageal reflux disease)    Hepatitis C    Hernia    HIV (human immunodeficiency virus infection) (HCC)    HIV (human immunodeficiency virus infection) (HCC)    Hypertension    Hypothyroidism    Pinched nerve    Scoliosis    TB (tuberculosis)    Social History   Socioeconomic History   Marital status: Single    Spouse name: Not on file   Number of children: 2   Years of education: Not on file   Highest education level: Not on file  Occupational History   Occupation: Disability  Tobacco Use   Smoking status: Every Day    Current packs/day: 1.00    Average packs/day: 1 pack/day for 35.0 years (35.0 ttl pk-yrs)    Types: Cigarettes, E-cigarettes    Passive exposure: Current   Smokeless tobacco: Never   Tobacco comments:    1 pack every 2-3 days  Vaping Use   Vaping status: Some Days  Substance and Sexual Activity   Alcohol use: Not Currently    Alcohol/week: 3.0 standard drinks of alcohol    Types: 3 Cans of beer per week   Drug use: Yes    Types: Marijuana  Comment: last cocaine was 07/15/2017.   Sexual activity: Not Currently    Birth control/protection: Condom, None    Comment: patient given condoms  Other Topics Concern   Not on file  Social History Narrative   Pt lives in an apartment by herself.  Pt stated that she is married and that her husband is in a Engineer, agricultural, and that husband is being released yesterday or today.  Asked several times who Pt's  psychiatric provider is.   Social Drivers of Corporate investment banker Strain: Not on file  Food Insecurity: Not on file  Transportation Needs: Not on file  Physical Activity: Not on file  Stress: Not on file  Social Connections: Not on file  Intimate Partner Violence: Not on file   Past Surgical History:  Procedure Laterality Date   BIOPSY  05/08/2018   Procedure: biopsy;  Surgeon: Ruby Corporal, MD;  Location: [facility];  Service: Endoscopy;;  proximal transverse colon polyp   BIOPSY  10/22/2019   Procedure: BIOPSY;  Surgeon: Ruby Corporal, MD;  Location: [facility];  Service: Endoscopy;;   BIOPSY  07/29/2021   Procedure: BIOPSY;  Surgeon: Urban Garden, MD;  Location: [facility];  Service: Gastroenterology;;   CESAREAN SECTION     CHOLECYSTECTOMY     COLONOSCOPY N/A 05/08/2018   Procedure: COLONOSCOPY;  Surgeon: Ruby Corporal, MD;  Location: [facility];  Service: Endoscopy;  Laterality: N/A;  2:00   COLONOSCOPY WITH PROPOFOL  N/A 07/29/2021   Procedure: COLONOSCOPY WITH PROPOFOL ;  Surgeon: Urban Garden, MD;  Location: [facility];  Service: Gastroenterology;  Laterality: N/A;  1245   ESOPHAGEAL DILATION  10/22/2019   Procedure: ESOPHAGEAL DILATION;  Surgeon: Ruby Corporal, MD;  Location: [facility];  Service: Endoscopy;;   ESOPHAGOGASTRODUODENOSCOPY (EGD) WITH PROPOFOL  N/A 10/22/2019   Procedure: ESOPHAGOGASTRODUODENOSCOPY (EGD) WITH PROPOFOL ;  Surgeon: Ruby Corporal, MD;  Location: [facility];  Service: Endoscopy;  Laterality: N/A;  1220   ESOPHAGOGASTRODUODENOSCOPY (EGD) WITH PROPOFOL  N/A 07/29/2021   Procedure: ESOPHAGOGASTRODUODENOSCOPY (EGD) WITH PROPOFOL ;  Surgeon: Urban Garden, MD;  Location: [facility];  Service: Gastroenterology;  Laterality: N/A;   HERNIA REPAIR     x2   INCISIONAL HERNIA REPAIR N/A 09/30/2018   Procedure: OPEN HERNIA REPAIR INCISIONAL WITH MESH;  Surgeon: Awilda Bogus, MD;   Location: [facility];  Service: General;  Laterality: N/A;   LIVER BIOPSY     Family History  Problem Relation Age of Onset   Diabetes Mother    Hypertension Mother    Stroke Mother        died 10/05/13  Cancer Sister    Cancer Maternal Aunt     Current Outpatient Medications:    acetaminophen  (TYLENOL ) 500 MG tablet, Take 1 tablet (500 mg total) by mouth every 6 (six) hours as needed (pain.)., Disp: 30 tablet, Rfl: 0   albuterol  (VENTOLIN  HFA) 108 (90 Base) MCG/ACT inhaler, INHALE TWO PUFFS INTO THE LUNGS EVERY 6 HOURS AS NEEDED FOR WHEEZING OR SHORTNESS OF BREATH, Disp: 18 g, Rfl: 0   amLODipine  (NORVASC ) 2.5 MG tablet, TAKE 1 TABLET(2.5 MG) BY MOUTH DAILY FOR BLOOD PRESSURE, Disp: 90 tablet, Rfl: 1   bictegravir-emtricitabine -tenofovir  AF (BIKTARVY ) 50-200-25 MG TABS tablet, TAKE ONE TABLET BY MOUTH DAILY (NEEDS OFFICE VISIT FOR FURTHER REFILLS), Disp: 30 tablet, Rfl: 11   diclofenac  (VOLTAREN ) 75 MG EC tablet, Take 1 tablet (75 mg total) by mouth  2 (two) times daily., Disp: 30 tablet, Rfl: 0   diclofenac  Sodium (VOLTAREN ) 1 % GEL, APPLY 4 GRAMS TOPICALLY TO THE AFFECTED AREA FOUR TIMES DAILY AS NEEDED FOR KNEE PAIN OR ARTHRITIS, Disp: 1200 g, Rfl: 4   FLUoxetine  (PROZAC ) 40 MG capsule, TAKE ONE CAPSULE BY MOUTH EVERY DAY., Disp: 90 capsule, Rfl: 0   hydrOXYzine  (VISTARIL ) 25 MG capsule, TAKE 1 CAPSULE(25 MG) BY MOUTH EVERY 8 HOURS AS NEEDED FOR ANXIETY, Disp: 90 capsule, Rfl: 3   loratadine  (ALLERGY RELIEF) 10 MG tablet, TAKE ONE TABLET BY MOUTH EVERY DAY, Disp: 90 tablet, Rfl: 1   losartan  (COZAAR ) 25 MG tablet, TAKE 1 TABLET(25 MG) BY MOUTH DAILY FOR BLOOD PRESSURE, Disp: 90 tablet, Rfl: 1   meclizine  (ANTIVERT ) 25 MG tablet, TAKE ONE TABLET BY MOUTH TWICE DAILY AS NEEDED FOR DIZZINESS. MUST LAST UNTIL NEUROLOGY VISIT, Disp: 30 tablet, Rfl: 4   mometasone  (NASONEX ) 50 MCG/ACT nasal spray, Place 2 sprays into the nose daily. (Patient taking differently: Place 2 sprays into the nose daily  as needed (allergies.).), Disp: 1 each, Rfl: 12   Multiple Vitamin (TAB-A-VITE) TABS, TAKE ONE TABLET BY MOUTH DAILY, Disp: 90 tablet, Rfl: 11   olopatadine  (PATADAY ) 0.1 % ophthalmic solution, Place 1 drop into both eyes daily. For allergy, Disp: 5 mL, Rfl: PRN   omeprazole  (PRILOSEC) 40 MG capsule, TAKE ONE CAPSULE BY MOUTH TWICE DAILY, Disp: 120 capsule, Rfl: 3   ondansetron  (ZOFRAN -ODT) 4 MG disintegrating tablet, 4mg  ODT q4 hours prn nausea/vomit, Disp: 12 tablet, Rfl: 0   sucralfate  (CARAFATE ) 1 GM/10ML suspension, TAKE 10 MLS BY MOUTH FOUR TIMES DAILY WITH MEALS AND AT BEDTIME, Disp: 420 mL, Rfl: 1   tiZANidine  (ZANAFLEX ) 4 MG tablet, TAKE 1/2 TO 1 TABLET BY MOUTH EVERY 8 HOUR AS NEEDED FOR MUSCLE SPASMS. USE SPARINGLY., Disp: 270 tablet, Rfl: 1  Allergies  Allergen Reactions   Aspirin Other (See Comments)    Liver problems   Tomato Other (See Comments)    Acid reflux.     ROS: Review of Systems {ros; complete:30496}    Physical exam {Exam, [phone]}      01/02/2023   10:05 AM 12/19/2022    8:19 AM 07/07/2022   10:39 AM  Depression screen PHQ 2/9  Decreased Interest 0 0 0  Down, Depressed, Hopeless 1 1 1   PHQ - 2 Score 1 1 1   Altered sleeping  1   Tired, decreased energy  1   Change in appetite  1   Feeling bad or failure about yourself   1   Trouble concentrating  1   Moving slowly or fidgety/restless  0   Suicidal thoughts  0   PHQ-9 Score  6   Difficult doing work/chores  Not difficult at all       12/19/2022    8:19 AM 06/02/2022   10:31 AM 03/15/2022   10:31 AM 10/26/2021    9:51 AM  GAD 7 : Generalized Anxiety Score  Nervous, Anxious, on Edge 1 1 1 2   Control/stop worrying 1 2 1 2   Worry too much - different things 1 2 1 2   Trouble relaxing 0 2 0 2  Restless 1 2 1 2   Easily annoyed or irritable 1 2 1 2   Afraid - awful might happen 0 0 1 0  Total GAD 7 Score 5 11 6 12   Anxiety Difficulty Somewhat difficult Somewhat difficult Somewhat difficult  Somewhat difficult     Assessment/ Plan: Whitney Hams  here for annual physical exam.   Annual physical exam  Type 2 diabetes mellitus with microalbuminuria, without long-term current use of insulin  (HCC)  Hypertension associated with diabetes (HCC)  Hyperlipidemia associated with type 2 diabetes mellitus (HCC)  Human immunodeficiency virus (HIV) disease (HCC)  Screening for malignant neoplasm of cervix  ***  Counseled on healthy lifestyle choices, including diet (rich in fruits, vegetables and lean meats and low in salt and simple carbohydrates) and exercise (at least 30 minutes of moderate physical activity daily).  Patient to follow up ***  Rayn Shorb M. Bonnell Butcher, DO

## 2023-07-03 ENCOUNTER — Ambulatory Visit: Payer: Medicaid Other | Admitting: Internal Medicine

## 2023-07-04 ENCOUNTER — Telehealth: Payer: Self-pay

## 2023-07-04 ENCOUNTER — Other Ambulatory Visit: Payer: Self-pay | Admitting: Internal Medicine

## 2023-07-04 DIAGNOSIS — B20 Human immunodeficiency virus [HIV] disease: Secondary | ICD-10-CM

## 2023-07-04 NOTE — Telephone Encounter (Signed)
 Front desk reaching out to the patient

## 2023-07-04 NOTE — Telephone Encounter (Signed)
 Due for appt/ will need to be reassigned

## 2023-07-04 NOTE — Telephone Encounter (Signed)
 Attempted to call patient to reassign to another provider, was a Previous Dr. Seymour Dapper patient. Voice mail not set up.

## 2023-07-05 ENCOUNTER — Other Ambulatory Visit (INDEPENDENT_AMBULATORY_CARE_PROVIDER_SITE_OTHER): Payer: Self-pay | Admitting: Gastroenterology

## 2023-07-05 NOTE — Telephone Encounter (Signed)
 Patient needs office visit. Not seen since 04/2022.

## 2023-07-12 NOTE — Progress Notes (Addendum)
 The 10-year ASCVD risk score (Arnett DK, et al., 2019) is: 20.8%   Values used to calculate the score:     Age: 58 years     Sex: Female     Is Non-Hispanic African American: Yes     Diabetic: Yes     Tobacco smoker: Yes     Systolic Blood Pressure: 122 mmHg     Is BP treated: Yes     HDL Cholesterol: 68 mg/dL     Total Cholesterol: 197 mg/dL  No current statin therapy. Front desk has attempted to contact patient for scheduling.   Kaelene Elliston, BSN, RN

## 2023-07-13 ENCOUNTER — Ambulatory Visit

## 2023-07-24 ENCOUNTER — Other Ambulatory Visit: Payer: Self-pay

## 2023-07-24 ENCOUNTER — Observation Stay (HOSPITAL_COMMUNITY)
Admission: EM | Admit: 2023-07-24 | Discharge: 2023-07-25 | Disposition: A | Discharge status: Home / Self Care | Attending: Family Medicine | Admitting: Family Medicine

## 2023-07-24 ENCOUNTER — Encounter (HOSPITAL_COMMUNITY): Payer: Self-pay

## 2023-07-24 ENCOUNTER — Emergency Department (HOSPITAL_COMMUNITY)

## 2023-07-24 DIAGNOSIS — F209 Schizophrenia, unspecified: Secondary | ICD-10-CM | POA: Diagnosis not present

## 2023-07-24 DIAGNOSIS — Z8659 Personal history of other mental and behavioral disorders: Secondary | ICD-10-CM | POA: Diagnosis not present

## 2023-07-24 DIAGNOSIS — J439 Emphysema, unspecified: Secondary | ICD-10-CM | POA: Diagnosis present

## 2023-07-24 DIAGNOSIS — F17210 Nicotine dependence, cigarettes, uncomplicated: Secondary | ICD-10-CM | POA: Insufficient documentation

## 2023-07-24 DIAGNOSIS — B20 Human immunodeficiency virus [HIV] disease: Secondary | ICD-10-CM | POA: Diagnosis not present

## 2023-07-24 DIAGNOSIS — E1159 Type 2 diabetes mellitus with other circulatory complications: Secondary | ICD-10-CM | POA: Diagnosis present

## 2023-07-24 DIAGNOSIS — F1290 Cannabis use, unspecified, uncomplicated: Secondary | ICD-10-CM | POA: Insufficient documentation

## 2023-07-24 DIAGNOSIS — K529 Noninfective gastroenteritis and colitis, unspecified: Secondary | ICD-10-CM | POA: Diagnosis not present

## 2023-07-24 DIAGNOSIS — Z79899 Other long term (current) drug therapy: Secondary | ICD-10-CM | POA: Diagnosis not present

## 2023-07-24 DIAGNOSIS — E876 Hypokalemia: Secondary | ICD-10-CM | POA: Diagnosis present

## 2023-07-24 DIAGNOSIS — I152 Hypertension secondary to endocrine disorders: Secondary | ICD-10-CM

## 2023-07-24 DIAGNOSIS — K7400 Hepatic fibrosis, unspecified: Secondary | ICD-10-CM | POA: Diagnosis present

## 2023-07-24 DIAGNOSIS — K219 Gastro-esophageal reflux disease without esophagitis: Secondary | ICD-10-CM | POA: Diagnosis not present

## 2023-07-24 DIAGNOSIS — I10 Essential (primary) hypertension: Secondary | ICD-10-CM | POA: Insufficient documentation

## 2023-07-24 DIAGNOSIS — E119 Type 2 diabetes mellitus without complications: Secondary | ICD-10-CM

## 2023-07-24 DIAGNOSIS — R42 Dizziness and giddiness: Secondary | ICD-10-CM | POA: Diagnosis present

## 2023-07-24 DIAGNOSIS — F314 Bipolar disorder, current episode depressed, severe, without psychotic features: Secondary | ICD-10-CM | POA: Diagnosis present

## 2023-07-24 LAB — MAGNESIUM: Magnesium: <0.5 mg/dL — CL (ref 1.7–2.4)

## 2023-07-24 LAB — COMPREHENSIVE METABOLIC PANEL WITH GFR
ALT: 31 U/L (ref 0–44)
AST: 53 U/L — ABNORMAL HIGH (ref 15–41)
Albumin: 4.3 g/dL (ref 3.5–5.0)
Alkaline Phosphatase: 60 U/L (ref 38–126)
Anion gap: 17 — ABNORMAL HIGH (ref 5–15)
BUN: 21 mg/dL — ABNORMAL HIGH (ref 6–20)
CO2: 20 mmol/L — ABNORMAL LOW (ref 22–32)
Calcium: 7.4 mg/dL — ABNORMAL LOW (ref 8.9–10.3)
Chloride: 104 mmol/L (ref 98–111)
Creatinine, Ser: 1.1 mg/dL — ABNORMAL HIGH (ref 0.44–1.00)
GFR, Estimated: 58 mL/min — ABNORMAL LOW (ref >60)
Glucose, Bld: 154 mg/dL — ABNORMAL HIGH (ref 70–99)
Potassium: 3 mmol/L — ABNORMAL LOW (ref 3.5–5.1)
Sodium: 141 mmol/L (ref 135–145)
Total Bilirubin: 0.7 mg/dL (ref 0.0–1.2)
Total Protein: 8 g/dL (ref 6.5–8.1)

## 2023-07-24 LAB — URINALYSIS, ROUTINE W REFLEX MICROSCOPIC
Bilirubin Urine: NEGATIVE
Glucose, UA: NEGATIVE mg/dL
Hgb urine dipstick: NEGATIVE
Ketones, ur: 5 mg/dL — AB
Nitrite: NEGATIVE
Protein, ur: >=300 mg/dL — AB
Specific Gravity, Urine: 1.01 (ref 1.005–1.030)
Squamous Epithelial / HPF: >50 /HPF (ref 0–5)
WBC, UA: >50 WBC/hpf (ref 0–5)
pH: 6 (ref 5.0–8.0)

## 2023-07-24 LAB — CBC
HCT: 35.2 % — ABNORMAL LOW (ref 36.0–46.0)
Hemoglobin: 12.7 g/dL (ref 12.0–15.0)
MCH: 33.4 pg (ref 26.0–34.0)
MCHC: 36.1 g/dL — ABNORMAL HIGH (ref 30.0–36.0)
MCV: 92.6 fL (ref 80.0–100.0)
Platelets: 269 10*3/uL (ref 150–400)
RBC: 3.8 MIL/uL — ABNORMAL LOW (ref 3.87–5.11)
RDW: 14.5 % (ref 11.5–15.5)
WBC: 7.2 10*3/uL (ref 4.0–10.5)
nRBC: 0 % (ref 0.0–0.2)

## 2023-07-24 LAB — URINALYSIS, W/ REFLEX TO CULTURE (INFECTION SUSPECTED)
Bilirubin Urine: NEGATIVE
Glucose, UA: NEGATIVE mg/dL
Hgb urine dipstick: NEGATIVE
Ketones, ur: 5 mg/dL — AB
Nitrite: NEGATIVE
Protein, ur: >=300 mg/dL — AB
Specific Gravity, Urine: 1.01 (ref 1.005–1.030)
pH: 6 (ref 5.0–8.0)

## 2023-07-24 LAB — CBG MONITORING, ED: Glucose-Capillary: 159 mg/dL — ABNORMAL HIGH (ref 70–99)

## 2023-07-24 LAB — CK: Total CK: 767 U/L — ABNORMAL HIGH (ref 38–234)

## 2023-07-24 LAB — LACTIC ACID, PLASMA
Lactic Acid, Venous: 2.1 mmol/L (ref 0.5–1.9)
Lactic Acid, Venous: 0.8 mmol/L (ref 0.5–1.9)

## 2023-07-24 MED ORDER — LACTATED RINGERS IV BOLUS
1000.0000 mL | Freq: Once | INTRAVENOUS | Status: AC
  Administered 2023-07-24: 1000 mL via INTRAVENOUS

## 2023-07-24 MED ORDER — ACETAMINOPHEN 650 MG RE SUPP: 650.0000 mg | Freq: Four times a day (QID) | RECTAL | Status: DC | PRN

## 2023-07-24 MED ORDER — IOHEXOL 300 MG/ML  SOLN
100.0000 mL | Freq: Once | INTRAMUSCULAR | Status: AC | PRN
  Administered 2023-07-24: 100 mL via INTRAVENOUS

## 2023-07-24 MED ORDER — POTASSIUM CHLORIDE CRYS ER 20 MEQ PO TBCR
40.0000 meq | EXTENDED_RELEASE_TABLET | Freq: Once | ORAL | Status: AC
  Administered 2023-07-24: 40 meq via ORAL
  Filled 2023-07-24: qty 2

## 2023-07-24 MED ORDER — ACETAMINOPHEN 325 MG PO TABS
650.0000 mg | ORAL_TABLET | Freq: Four times a day (QID) | ORAL | Status: DC | PRN
  Administered 2023-07-24: 650 mg via ORAL
  Filled 2023-07-24: qty 2

## 2023-07-24 MED ORDER — ONDANSETRON HCL 4 MG PO TABS: 4.0000 mg | ORAL_TABLET | Freq: Four times a day (QID) | ORAL | Status: DC | PRN

## 2023-07-24 MED ORDER — ONDANSETRON HCL 4 MG/2ML IJ SOLN
4.0000 mg | Freq: Four times a day (QID) | INTRAMUSCULAR | Status: DC | PRN
  Administered 2023-07-24: 4 mg via INTRAVENOUS
  Filled 2023-07-24: qty 2

## 2023-07-24 MED ORDER — ENOXAPARIN SODIUM 40 MG/0.4ML IJ SOSY
40.0000 mg | PREFILLED_SYRINGE | INTRAMUSCULAR | Status: DC
  Administered 2023-07-24: 40 mg via SUBCUTANEOUS
  Filled 2023-07-24: qty 0.4

## 2023-07-24 MED ORDER — MECLIZINE HCL 12.5 MG PO TABS
25.0000 mg | ORAL_TABLET | Freq: Once | ORAL | Status: AC
  Administered 2023-07-24: 25 mg via ORAL
  Filled 2023-07-24: qty 2

## 2023-07-24 MED ORDER — TIZANIDINE HCL 2 MG PO TABS
2.0000 mg | ORAL_TABLET | Freq: Three times a day (TID) | ORAL | Status: DC | PRN
  Administered 2023-07-24: 2 mg via ORAL
  Filled 2023-07-24: qty 1

## 2023-07-24 MED ORDER — AMLODIPINE BESYLATE 5 MG PO TABS
2.5000 mg | ORAL_TABLET | Freq: Every day | ORAL | Status: DC
  Administered 2023-07-24: 2.5 mg via ORAL
  Filled 2023-07-24 (×2): qty 1

## 2023-07-24 MED ORDER — BICTEGRAVIR-EMTRICITAB-TENOFOV 50-200-25 MG PO TABS
1.0000 | ORAL_TABLET | Freq: Every day | ORAL | Status: DC
  Filled 2023-07-24 (×2): qty 1

## 2023-07-24 MED ORDER — FLUOXETINE HCL 20 MG PO CAPS
40.0000 mg | ORAL_CAPSULE | Freq: Every day | ORAL | Status: DC
  Administered 2023-07-25: 40 mg via ORAL
  Filled 2023-07-24: qty 2

## 2023-07-24 MED ORDER — LACTATED RINGERS IV SOLN
INTRAVENOUS | Status: AC
Start: 2023-07-24 — End: 2023-07-25

## 2023-07-24 MED ORDER — POTASSIUM CHLORIDE 10 MEQ/100ML IV SOLN
10.0000 meq | INTRAVENOUS | Status: AC
  Administered 2023-07-24 (×2): 10 meq via INTRAVENOUS
  Filled 2023-07-24 (×2): qty 100

## 2023-07-24 MED ORDER — PANTOPRAZOLE SODIUM 40 MG PO TBEC
40.0000 mg | DELAYED_RELEASE_TABLET | Freq: Every day | ORAL | Status: DC
  Administered 2023-07-25: 40 mg via ORAL
  Filled 2023-07-24: qty 1

## 2023-07-24 NOTE — Assessment & Plan Note (Addendum)
 Stable. -Resume Norvasc  2.5 - Hold losartan  25mg  for contrast exposure

## 2023-07-24 NOTE — H&P (Signed)
 History and Physical    Ashley [name] YQM:[id number] DOB: 1965-05-29 DOA: 07/24/2023  PCP: Eliodoro Guerin, DO   Patient coming from: Home  I have personally briefly reviewed patient's old medical records in [facility]  Chief Complaint: Dizziness  HPI: Ashley [name] is a 58 y.o. female with medical history significant for HIV, hypertension, emphysema, diabetes mellitus, bipolar disorder, depression, tobacco abuse. Patient was brought to the ED with complaints of dizziness, headache, weakness, fever, and reported altered mental status of 2 weeks duration.  At the time of my evaluation, patient is awake alert oriented x 4 and able to answer questions appropriately.  Patient's son Bambi Lever is at bedside. Patient reported over the past 3 weeks she has had multiple episodes of vomiting, and diarrhea daily.  Reports mid abdominal pain.  She reports dizziness when standing and that she is unable to ambulate.  She reports generalized weakness.  I asked patient's son about mental status, reports patient's speech response has been slow, but denies any confused speech. Patient's caregiver came to see patient today apparently patient locked the door, but per patient this was not intentional.  Caregiver came in through the window.  She felt patient was not at her baseline, and recommended family call EMS.  ED Course: Temperature 98.3.  Heart rate 88-98.  Respiratory rate 16-21.  Blood pressure systolic 149-157.  O2 sat greater than 98% on room air. Potassium low at 3.  Mild anion gap of 17, serum bicarb of 20. WBC normal at 7.2. CT abdomen and pelvis with contrast without acute abnormality.  Head CT negative for acute abnormality. Chest x-ray clear. 1 L bolus given, KCL, meclizine  given.  Review of Systems: As per HPI all other systems reviewed and negative.  Past Medical History:  Diagnosis Date   Ankle fracture    Anxiety    Asthma    Collagen vascular disease (HCC)    COPD (chronic  obstructive pulmonary disease) (HCC)    Depression    Diabetes mellitus    Elevated LFTs 01/13/2012   GERD (gastroesophageal reflux disease)    Hepatitis C    Hernia    HIV (human immunodeficiency virus infection) (HCC)    HIV (human immunodeficiency virus infection) (HCC)    Hypertension    Hypothyroidism    Pinched nerve    Scoliosis    TB (tuberculosis)     Past Surgical History:  Procedure Laterality Date   BIOPSY  05/08/2018   Procedure: biopsy;  Surgeon: Ruby Corporal, MD;  [facility];  Service: Endoscopy;;  proximal transverse colon polyp   BIOPSY  10/22/2019   Procedure: BIOPSY;  Surgeon: Ruby Corporal, MD;  [facility];  Service: Endoscopy;;   BIOPSY  07/29/2021   Procedure: BIOPSY;  Surgeon: Urban Garden, MD;  [facility];  Service: Gastroenterology;;   CESAREAN SECTION     CHOLECYSTECTOMY     COLONOSCOPY N/A 05/08/2018   Procedure: COLONOSCOPY;  Surgeon: Ruby Corporal, MD;  [facility];  Service: Endoscopy;  Laterality: N/A;  2:00   COLONOSCOPY WITH PROPOFOL  N/A 07/29/2021   Procedure: COLONOSCOPY WITH PROPOFOL ;  Surgeon: Urban Garden, MD;  [facility];  Service: Gastroenterology;  Laterality: N/A;  1245   ESOPHAGEAL DILATION  10/22/2019   Procedure: ESOPHAGEAL DILATION;  Surgeon: Ruby Corporal, MD;  [facility];  Service: Endoscopy;;   ESOPHAGOGASTRODUODENOSCOPY (EGD) WITH PROPOFOL  N/A 10/22/2019   Procedure:  ESOPHAGOGASTRODUODENOSCOPY (EGD) WITH PROPOFOL ;  Surgeon: Ruby Corporal, MD;  [facility];  Service: Endoscopy;  Laterality: N/A;  1220   ESOPHAGOGASTRODUODENOSCOPY (EGD) WITH PROPOFOL  N/A 07/29/2021   Procedure: ESOPHAGOGASTRODUODENOSCOPY (EGD) WITH PROPOFOL ;  Surgeon: Urban Garden, MD;  [facility];  Service: Gastroenterology;  Laterality: N/A;   HERNIA REPAIR     x2   INCISIONAL HERNIA REPAIR N/A 09/30/2018   Procedure:  OPEN HERNIA REPAIR INCISIONAL WITH MESH;  Surgeon: Awilda Bogus, MD;  Location: [facility];  Service: General;  Laterality: N/A;   LIVER BIOPSY       reports that she has been smoking cigarettes and e-cigarettes. She has a 35 pack-year smoking history. She has been exposed to tobacco smoke. She has never used smokeless tobacco. She reports that she does not currently use alcohol after a past usage of about 3.0 standard drinks of alcohol per week. She reports current drug use. Drug: Marijuana.  Allergies  Allergen Reactions   Aspirin Other (See Comments)    Liver problems   Tomato Other (See Comments)    Acid reflux.    Family History  Problem Relation Age of Onset   Diabetes Mother    Hypertension Mother    Stroke Mother        died 09/07/2013  Cancer Sister    Cancer Maternal Aunt     Prior to Admission medications   Medication Sig Start Date End Date Taking? Authorizing Provider  acetaminophen  (TYLENOL ) 500 MG tablet Take 1 tablet (500 mg total) by mouth every 6 (six) hours as needed (pain.). 02/07/22  Yes Lorel Roes, NP  albuterol  (VENTOLIN  HFA) 108 (90 Base) MCG/ACT inhaler INHALE TWO PUFFS INTO THE LUNGS EVERY 6 HOURS AS NEEDED FOR WHEEZING OR SHORTNESS OF BREATH 05/28/23  Yes Gottschalk, Ashly M, DO  amLODipine  (NORVASC ) 2.5 MG tablet TAKE 1 TABLET(2.5 MG) BY MOUTH DAILY FOR BLOOD PRESSURE 03/12/23  Yes Gottschalk, Ashly M, DO  bictegravir-emtricitabine -tenofovir  AF (BIKTARVY ) 50-200-25 MG TABS tablet TAKE ONE TABLET BY MOUTH DAILY (NEEDS OFFICE VISIT FOR FURTHER REFILLS) 07/07/22  Yes Comer, Judithann Novas, MD  diclofenac  Sodium (VOLTAREN ) 1 % GEL APPLY 4 GRAMS TOPICALLY TO THE AFFECTED AREA FOUR TIMES DAILY AS NEEDED FOR KNEE PAIN OR ARTHRITIS 06/28/22  Yes Gottschalk, Ashly M, DO  FLUoxetine  (PROZAC ) 40 MG capsule TAKE ONE CAPSULE BY MOUTH EVERY DAY. 06/04/23  Yes Gottschalk, Ashly M, DO  hydrOXYzine  (VISTARIL ) 25 MG capsule TAKE 1 CAPSULE(25 MG) BY MOUTH EVERY 8 HOURS AS NEEDED  FOR ANXIETY 01/24/23  Yes Gottschalk, Ashly M, DO  loratadine  (ALLERGY RELIEF) 10 MG tablet TAKE ONE TABLET BY MOUTH EVERY DAY 02/23/23  Yes Gottschalk, Ashly M, DO  losartan  (COZAAR ) 25 MG tablet TAKE 1 TABLET(25 MG) BY MOUTH DAILY FOR BLOOD PRESSURE 03/12/23  Yes Gottschalk, Ashly M, DO  meclizine  (ANTIVERT ) 25 MG tablet TAKE ONE TABLET BY MOUTH TWICE DAILY AS NEEDED FOR DIZZINESS. MUST LAST UNTIL NEUROLOGY VISIT 03/12/23  Yes Vicky Grange M, DO  Multiple Vitamin (TAB-A-VITE) TABS TAKE ONE TABLET BY MOUTH DAILY 07/31/22  Yes Gottschalk, Ashly M, DO  omeprazole  (PRILOSEC) 40 MG capsule TAKE ONE CAPSULE BY MOUTH TWICE DAILY 11/06/22  Yes Carlan, Chelsea L, NP  tiZANidine  (ZANAFLEX ) 4 MG tablet TAKE 1/2 TO 1 TABLET BY MOUTH EVERY 8 HOUR AS NEEDED FOR MUSCLE SPASMS. USE SPARINGLY. 03/12/23  Yes Vicky Grange M, DO  olopatadine  (PATADAY ) 0.1 % ophthalmic solution Place 1 drop into both eyes  daily. For allergy 03/15/22   Vicky Grange M, DO  sucralfate  (CARAFATE ) 1 GM/10ML suspension TAKE 10 MLS BY MOUTH FOUR TIMES DAILY WITH MEALS AND AT BEDTIME 03/13/23   Patterson Bora, NP    Physical Exam: Vitals:   07/24/23 0948 07/24/23 1300 07/24/23 1430 07/24/23 1700  BP:  (!) 149/118 (!) 150/93 (!) 155/80  Pulse:  98 88 92  Resp:  16 (!) 21 18  Temp:   98.7 F (37.1 C)   TempSrc:   Oral   SpO2:  98% 99% 100%  Weight: 62.3 kg     Height: 4\' 11"  (1.499 m)       Constitutional: NAD, calm, comfortable Vitals:   07/24/23 0948 07/24/23 1300 07/24/23 1430 07/24/23 1700  BP:  (!) 149/118 (!) 150/93 (!) 155/80  Pulse:  98 88 92  Resp:  16 (!) 21 18  Temp:   98.7 F (37.1 C)   TempSrc:   Oral   SpO2:  98% 99% 100%  Weight: 62.3 kg     Height: 4\' 11"  (1.499 m)      Eyes: PERRL, lids and conjunctivae normal ENMT: Mucous membranes are moist.  Neck: normal, supple, no masses, no thyromegaly Respiratory: clear to auscultation bilaterally, no wheezing, no crackles. Normal respiratory effort. No  accessory muscle use.  Cardiovascular: Regular rate and rhythm, no murmurs / rubs / gallops. No extremity edema.   Abdomen: Diffuse abdominal tenderness, no masses palpated. No hepatosplenomegaly. Bowel sounds positive.  Musculoskeletal: no clubbing / cyanosis. No joint deformity upper and lower extremities.  Skin: no rashes, lesions, ulcers. No induration Neurologic: 4/5 strength to bilateral upper and lower extremities.  Speech fluent without evidence of aphasia, no delay in speech, no facial asymmetry.Aaron Aas  Psychiatric: Normal judgment and insight. Alert and oriented x 3. Normal mood.   Labs on Admission: I have personally reviewed following labs and imaging studies  CBC: Recent Labs  Lab 07/24/23 1000  WBC 7.2  HGB 12.7  HCT 35.2*  MCV 92.6  PLT 269   Basic Metabolic Panel: Recent Labs  Lab 07/24/23 1000  NA 141  K 3.0*  CL 104  CO2 20*  GLUCOSE 154*  BUN 21*  CREATININE 1.10*  CALCIUM 7.4*   GFR: Estimated Creatinine Clearance: 44.2 mL/min (A) (by C-G formula based on SCr of 1.1 mg/dL (H)). Liver Function Tests: Recent Labs  Lab 07/24/23 1000  AST 53*  ALT 31  ALKPHOS 60  BILITOT 0.7  PROT 8.0  ALBUMIN 4.3   Cardiac Enzymes: Recent Labs  Lab 07/24/23 1000  CKTOTAL 767*   CBG: Recent Labs  Lab 07/24/23 0947  GLUCAP 159*   Urine analysis:    Component Value Date/Time   COLORURINE AMBER (A) 07/24/2023 1426   APPEARANCEUR CLOUDY (A) 07/24/2023 1426   APPEARANCEUR Clear 03/15/2022 0959   LABSPEC 1.010 07/24/2023 1426   LABSPEC 1.013 10/14/2013 1425   PHURINE 6.0 07/24/2023 1426   GLUCOSEU NEGATIVE 07/24/2023 1426   GLUCOSEU Negative 10/14/2013 1425   HGBUR NEGATIVE 07/24/2023 1426   BILIRUBINUR NEGATIVE 07/24/2023 1426   BILIRUBINUR Negative 03/15/2022 0959   BILIRUBINUR Negative 10/14/2013 1425   KETONESUR 5 (A) 07/24/2023 1426   PROTEINUR >=300 (A) 07/24/2023 1426   UROBILINOGEN 1.0 05/09/2014 0245   NITRITE NEGATIVE 07/24/2023 1426    LEUKOCYTESUR LARGE (A) 07/24/2023 1426   LEUKOCYTESUR 1+ 10/14/2013 1425    Radiological Exams on Admission: CT ABDOMEN PELVIS W CONTRAST Result Date: 07/24/2023 CLINICAL DATA:  Abdominal pain, acute,  nonlocalized, dizziness, intermittent fever, and weakness EXAM: CT ABDOMEN AND PELVIS WITH CONTRAST TECHNIQUE: Multidetector CT imaging of the abdomen and pelvis was performed using the standard protocol following bolus administration of intravenous contrast. RADIATION DOSE REDUCTION: This exam was performed according to the departmental dose-optimization program which includes automated exposure control, adjustment of the mA and/or kV according to patient size and/or use of iterative reconstruction technique. CONTRAST:  OMNIPAQUE  IOHEXOL  300 MG/ML  SOLN COMPARISON:  June 21, 2021, November 06 2019 FINDINGS: Lower chest: No focal airspace consolidation or pleural effusion.Multi-vessel coronary atherosclerosis. Hepatobiliary: No mass.Cholecystectomy. No intrahepatic or extrahepatic biliary ductal dilation. The portal veins are patent. Pancreas: No mass or main ductal dilation. No peripancreatic inflammation or fluid collection. Spleen: Normal size. No mass. Adrenals/Urinary Tract: Nodular thickening of the left adrenal gland, unchanged, without discrete mass or dominant nodule. Cortical scarring in the upper pole of the left kidney. Tiny hypodensity in the left kidney, unchanged, too small to definitively characterize, but likely a small cyst. No nephrolithiasis or hydronephrosis. The urinary bladder is distended without focal abnormality. Stomach/Bowel: The stomach is decompressed without focal abnormality. No small bowel wall thickening or inflammation. No small bowel obstruction.Normal appendix. Vascular/Lymphatic: No aortic aneurysm. Scattered aortoiliac atherosclerosis. No intraabdominal or pelvic lymphadenopathy. Reproductive: Age-related atrophy of the uterus and ovaries. No concerning adnexal  mass.No free pelvic fluid. Other: No pneumoperitoneum, ascites, or mesenteric inflammation. Musculoskeletal: No acute fracture or destructive lesion. Unchanged rectus diastasis. Multilevel degenerative disc disease of the thoracic spine with multilevel lumbar facet arthropathy. IMPRESSION: No acute intraabdominal or pelvic abnormality. Electronically Signed   By: Rance Burrows M.D.   On: 07/24/2023 15:20   CT Head Wo Contrast Result Date: 07/24/2023 CLINICAL DATA:  Head trauma, dizziness, headache. EXAM: CT HEAD WITHOUT CONTRAST TECHNIQUE: Contiguous axial images were obtained from the base of the skull through the vertex without intravenous contrast. RADIATION DOSE REDUCTION: This exam was performed according to the departmental dose-optimization program which includes automated exposure control, adjustment of the mA and/or kV according to patient size and/or use of iterative reconstruction technique. COMPARISON:  CT head 06/21/2021. FINDINGS: Brain: No acute intracranial hemorrhage. No CT evidence of acute infarct. Remote lacunar infarct in the right basal ganglia which is new since 2023. No edema, mass effect, or midline shift. The basilar cisterns are patent. Ventricles: The ventricles are normal. Vascular: No hyperdense vessel or unexpected calcification. Skull: No acute or aggressive finding. Orbits: Orbits are symmetric. Sinuses: Mucosal thickening in the right sphenoid sinus. Other: Small right mastoid effusion. IMPRESSION: No CT evidence of acute intracranial abnormality. Remote lacunar infarct in the right basal ganglia which is new since 2023. Small right mastoid effusion. Electronically Signed   By: Denny Flack M.D.   On: 07/24/2023 14:47   DG Chest Portable 1 View Result Date: 07/24/2023 CLINICAL DATA:  weakness EXAM: PORTABLE CHEST - 1 VIEW COMPARISON:  November 14, 2021 FINDINGS: Low lung volumes. No focal airspace consolidation, pleural effusion, or pneumothorax. Tortuous aorta, unchanged.  No cardiomegaly. No acute fracture or destructive lesion. Cholecystectomy clips. Multilevel thoracic osteophytosis. IMPRESSION: Low lung volumes.  Otherwise, no acute cardiopulmonary abnormality. Electronically Signed   By: Rance Burrows M.D.   On: 07/24/2023 14:40    EKG: Independently reviewed.  Sinus rhythm, rate 97, QTc 467.  No significant change from prior.  Assessment/Plan Principal Problem:   Gastroenteritis Active Problems:   Hypokalemia   Human immunodeficiency virus (HIV) disease (HCC)   Diet-controlled diabetes mellitus (HCC)   Schizophrenia (HCC)  BIPOLAR AFFECTIVE DISORDER, DEPRESSED, SEVERE   Hypertension associated with diabetes (HCC)   Liver fibrosis   Pulmonary emphysema (HCC)  Assessment and Plan: * Gastroenteritis Presenting with multiple episodes of vomiting and diarrhea over the past 3 weeks. With abdominal pain, dizziness, hypokalemia.  She is afebrile without leukocytosis.  She has a mild anion gap metabolic acidosis with bicarb of 22, anion gap of 17. CT abdomen and pelvis without acute abnormality.  Mental status is intact, no focal neurologic deficits appreciated. -1 L bolus given, continue LR 100cc/hr x 15hrs -Bowel rest with clear liquid diet - Stool C. Difficile -GI pathogen panel -Hold off on antibiotics at this time -Zofran  as needed -Trend BMP- Anion gap, bicarb  Hypokalemia Potassium 3. -Check magnesium   Hypertension associated with diabetes (HCC) Stable. -Resume Norvasc  2.5 - Hold losartan  25mg  for contrast exposure  BIPOLAR AFFECTIVE DISORDER, DEPRESSED, SEVERE Resume fluoxetine , hydroxyzine   Diet-controlled diabetes mellitus (HCC) Controlled.  A1c 5.2.  Not on medication.  Human immunodeficiency virus (HIV) disease (HCC) 12/2022- Last counts HIV viral load 1.8, T helper cells- 657. -Resume Biktarvy    DVT prophylaxis: Lovenox  Code Status: FULL Family Communication: None at bedside Disposition Plan: ~ 2 days Consults called:  None Admission status:  Obs tele    Author: Pati Bonine, MD 07/24/2023 6:44 PM  For on call review [email or web address].

## 2023-07-24 NOTE — Assessment & Plan Note (Signed)
 Controlled.  A1c 5.2.  Not on medication.

## 2023-07-24 NOTE — ED Notes (Signed)
 Pt states LLQ pain. EDP notified.

## 2023-07-24 NOTE — ED Triage Notes (Signed)
 Pt BIB ems for dizziness, headache, weakness, intermittent fever, and some AMS for about 3 weeks now. Per EMS pt locked caregiver out of her home yesterday, when questioning pt she states she just forgot she was coming.

## 2023-07-24 NOTE — Assessment & Plan Note (Signed)
 Resume fluoxetine , hydroxyzine 

## 2023-07-24 NOTE — Progress Notes (Signed)
   07/24/23 2103  TOC Brief Assessment  Insurance and Status Reviewed  Patient has primary care physician Yes  Home environment has been reviewed From home  Prior level of function: Assisted  Prior/Current Home Services Current home services (aide)  Social Drivers of Health Review SDOH reviewed interventions complete (smoking cessation added to AVS)  Readmission risk has been reviewed Yes  Transition of care needs no transition of care needs at this time   Transition of Care Department [facility]) has reviewed patient and no other TOC needs have been identified at this time. We will continue to monitor patient advancement through interdisciplinary progression rounds. If new patient needs arise, please place a TOC consult.

## 2023-07-24 NOTE — Assessment & Plan Note (Signed)
 Potassium 3. -Check magnesium

## 2023-07-24 NOTE — ED Notes (Signed)
Pt given water per MD approval

## 2023-07-24 NOTE — Assessment & Plan Note (Addendum)
 Presenting with multiple episodes of vomiting and diarrhea over the past 3 weeks. With abdominal pain, dizziness, hypokalemia.  She is afebrile without leukocytosis.  She has a mild anion gap metabolic acidosis with bicarb of 22, anion gap of 17. CT abdomen and pelvis without acute abnormality.  Mental status is intact, no focal neurologic deficits appreciated. -1 L bolus given, continue LR 100cc/hr x 15hrs -Bowel rest with clear liquid diet - Stool C. Difficile -GI pathogen panel -Hold off on antibiotics at this time -Zofran  as needed -Trend BMP- Anion gap, bicarb

## 2023-07-24 NOTE — Assessment & Plan Note (Signed)
 12/2022- Last counts HIV viral load 1.8, T helper cells- 657. -Resume Biktarvy 

## 2023-07-24 NOTE — ED Provider Notes (Signed)
 Patient care to me.  Urinalysis shows leukocytes and bacteria but also has significant squamous epithelial cells.  No specific urinary symptoms.  The son at the bedside is different than the one that Dr. Val Garin spoke to and notes that she does seem maybe a little confused.  Patient is oriented to month and year though off on the day of the week.  There does not appear to be any focal neurodeficits.  Patient reports she has been having some vomiting and diarrhea as well as some dizziness.  When asked why the patient locked the caregiver out of the house she states that she was alone in the house and so she locked the door but was not specifically trying to keep someone out.  Overall, patient is not ill-appearing and her vital signs are overall reassuring but I think with the concerns as above, she will likely need an MRI and an admission.  No fevers at this time.  Dr. Quintella Buck will admit.   Ashley Montenegro, MD 07/24/23 [phone]

## 2023-07-24 NOTE — ED Provider Notes (Signed)
 [place] EM[facility]GENCY DEPARTMENT AT [facility] Provider Note   CSN: [id number] Arrival date & time: 07/24/23  8295     History  Chief Complaint  Patient presents with   Dizziness    Ashley [name] is a 58 y.o. female.   Dizziness Patient brought in with mental status change.  Reportedly dizziness headaches fever for around 3 weeks.  Patient states had some vomiting.  States she feels dizzy.  States she feels lightheaded.  Reportedly locked caregiver out of the house.    Past Medical History:  Diagnosis Date   Ankle fracture    Anxiety    Asthma    Collagen vascular disease (HCC)    COPD (chronic obstructive pulmonary disease) (HCC)    Depression    Diabetes mellitus    Elevated LFTs 01/13/2012   GERD (gastroesophageal reflux disease)    Hepatitis C    Hernia    HIV (human immunodeficiency virus infection) (HCC)    HIV (human immunodeficiency virus infection) (HCC)    Hypertension    Hypothyroidism    Pinched nerve    Scoliosis    TB (tuberculosis)     Home Medications Prior to Admission medications   Medication Sig Start Date End Date Taking? Authorizing Provider  acetaminophen  (TYLENOL ) 500 MG tablet Take 1 tablet (500 mg total) by mouth every 6 (six) hours as needed (pain.). 02/07/22  Yes Lorel Roes, NP  albuterol  (VENTOLIN  HFA) 108 (90 Base) MCG/ACT inhaler INHALE TWO PUFFS INTO THE LUNGS EVERY 6 HOURS AS NEEDED FOR WHEEZING OR SHORTNESS OF BREATH 05/28/23  Yes Gottschalk, Ashly M, DO  amLODipine  (NORVASC ) 2.5 MG tablet TAKE 1 TABLET(2.5 MG) BY MOUTH DAILY FOR BLOOD PRESSURE 03/12/23  Yes Gottschalk, Ashly M, DO  bictegravir-emtricitabine -tenofovir  AF (BIKTARVY ) 50-200-25 MG TABS tablet TAKE ONE TABLET BY MOUTH DAILY (NEEDS OFFICE VISIT FOR FURTHER REFILLS) 07/07/22  Yes Comer, Judithann Novas, MD  diclofenac  Sodium (VOLTAREN ) 1 % GEL APPLY 4 GRAMS TOPICALLY TO THE AFFECTED AREA FOUR TIMES DAILY AS NEEDED FOR KNEE PAIN OR ARTHRITIS 06/28/22  Yes Gottschalk,  Ashly M, DO  FLUoxetine  (PROZAC ) 40 MG capsule TAKE ONE CAPSULE BY MOUTH EVERY DAY. 06/04/23  Yes Gottschalk, Ashly M, DO  hydrOXYzine  (VISTARIL ) 25 MG capsule TAKE 1 CAPSULE(25 MG) BY MOUTH EVERY 8 HOURS AS NEEDED FOR ANXIETY 01/24/23  Yes Gottschalk, Ashly M, DO  loratadine  (ALLERGY RELIEF) 10 MG tablet TAKE ONE TABLET BY MOUTH EVERY DAY 02/23/23  Yes Gottschalk, Ashly M, DO  losartan  (COZAAR ) 25 MG tablet TAKE 1 TABLET(25 MG) BY MOUTH DAILY FOR BLOOD PRESSURE 03/12/23  Yes Gottschalk, Ashly M, DO  meclizine  (ANTIVERT ) 25 MG tablet TAKE ONE TABLET BY MOUTH TWICE DAILY AS NEEDED FOR DIZZINESS. MUST LAST UNTIL NEUROLOGY VISIT 03/12/23  Yes Vicky Grange M, DO  Multiple Vitamin (TAB-A-VITE) TABS TAKE ONE TABLET BY MOUTH DAILY 07/31/22  Yes Gottschalk, Ashly M, DO  omeprazole  (PRILOSEC) 40 MG capsule TAKE ONE CAPSULE BY MOUTH TWICE DAILY 11/06/22  Yes Carlan, Chelsea L, NP  tiZANidine  (ZANAFLEX ) 4 MG tablet TAKE 1/2 TO 1 TABLET BY MOUTH EVERY 8 HOUR AS NEEDED FOR MUSCLE SPASMS. USE SPARINGLY. 03/12/23  Yes Gottschalk, Sharolyn Decant M, DO  olopatadine  (PATADAY ) 0.1 % ophthalmic solution Place 1 drop into both eyes daily. For allergy 03/15/22   Vicky Grange M, DO  sucralfate  (CARAFATE ) 1 GM/10ML suspension TAKE 10 MLS BY MOUTH FOUR TIMES DAILY WITH MEALS AND AT BEDTIME 03/13/23   Carlan, Fidel Huddle, NP  Allergies    Aspirin and Tomato    Review of Systems   Review of Systems  Neurological:  Positive for dizziness.    Physical Exam Updated Vital Signs BP (!) 149/118   Pulse 88   Temp 98.7 F (37.1 C) (Oral)   Resp (!) 21   Ht 4\' 11"  (1.499 m)   Wt 62.3 kg   SpO2 99%   BMI 27.73 kg/m  Physical Exam Vitals reviewed.  HENT:     Head: Normocephalic.  Cardiovascular:     Rate and Rhythm: Regular rhythm.  Abdominal:     Tenderness: There is abdominal tenderness.     Comments: Mild diffuse abdominal tenderness.  No rebound or guarding.  No hernia palpated.  Musculoskeletal:     Cervical back:  Neck supple.  Neurological:     Mental Status: She is alert.     Comments: Some confusion.     ED Results / Procedures / Treatments   Labs (all labs ordered are listed, but only abnormal results are displayed) Labs Reviewed  COMPREHENSIVE METABOLIC PANEL WITH GFR - Abnormal; Notable for the following components:      Result Value   Potassium 3.0 (*)    CO2 20 (*)    Glucose, Bld 154 (*)    BUN 21 (*)    Creatinine, Ser 1.10 (*)    Calcium 7.4 (*)    AST 53 (*)    GFR, Estimated 58 (*)    Anion gap 17 (*)    All other components within normal limits  CBC - Abnormal; Notable for the following components:   RBC 3.80 (*)    HCT 35.2 (*)    MCHC 36.1 (*)    All other components within normal limits  LACTIC ACID, PLASMA - Abnormal; Notable for the following components:   Lactic Acid, Venous 2.1 (*)    All other components within normal limits  CK - Abnormal; Notable for the following components:   Total CK 767 (*)    All other components within normal limits  CBG MONITORING, ED - Abnormal; Notable for the following components:   Glucose-Capillary 159 (*)    All other components within normal limits  LACTIC ACID, PLASMA  URINALYSIS, ROUTINE W REFLEX MICROSCOPIC  CBG MONITORING, ED    EKG EKG Interpretation Date/Time:  Tuesday Jul 24 2023 09:44:22 EDT Ventricular Rate:  97 PR Interval:  169 QRS Duration:  87 QT Interval:  367 QTC Calculation: 467 R Axis:   43  Text Interpretation: Sinus rhythm Biatrial enlargement Anterior infarct, old Confirmed by Mozell Arias [phone]) on 07/24/2023 2:25:58 PM  Radiology CT Head Wo Contrast Result Date: 07/24/2023 CLINICAL DATA:  Head trauma, dizziness, headache. EXAM: CT HEAD WITHOUT CONTRAST TECHNIQUE: Contiguous axial images were obtained from the base of the skull through the vertex without intravenous contrast. RADIATION DOSE REDUCTION: This exam was performed according to the departmental dose-optimization program which  includes automated exposure control, adjustment of the mA and/or kV according to patient size and/or use of iterative reconstruction technique. COMPARISON:  CT head 06/21/2021. FINDINGS: Brain: No acute intracranial hemorrhage. No CT evidence of acute infarct. Remote lacunar infarct in the right basal ganglia which is new since 2023. No edema, mass effect, or midline shift. The basilar cisterns are patent. Ventricles: The ventricles are normal. Vascular: No hyperdense vessel or unexpected calcification. Skull: No acute or aggressive finding. Orbits: Orbits are symmetric. Sinuses: Mucosal thickening in the right sphenoid sinus. Other: Small right mastoid effusion.  IMPRESSION: No CT evidence of acute intracranial abnormality. Remote lacunar infarct in the right basal ganglia which is new since 2023. Small right mastoid effusion. Electronically Signed   By: Denny Flack M.D.   On: 07/24/2023 14:47   DG Chest Portable 1 View Result Date: 07/24/2023 CLINICAL DATA:  weakness EXAM: PORTABLE CHEST - 1 VIEW COMPARISON:  November 14, 2021 FINDINGS: Low lung volumes. No focal airspace consolidation, pleural effusion, or pneumothorax. Tortuous aorta, unchanged. No cardiomegaly. No acute fracture or destructive lesion. Cholecystectomy clips. Multilevel thoracic osteophytosis. IMPRESSION: Low lung volumes.  Otherwise, no acute cardiopulmonary abnormality. Electronically Signed   By: Rance Burrows M.D.   On: 07/24/2023 14:40    Procedures Procedures    Medications Ordered in ED Medications  iohexol  (OMNIPAQUE ) 300 MG/ML solution 100 mL (100 mLs Intravenous Contrast Given 07/24/23 1232)    ED Course/ Medical Decision Making/ A&P                                 Medical Decision Making Amount and/or Complexity of Data Reviewed Labs: ordered. Radiology: ordered.  Risk Prescription drug management.   Patient feeling weak dizzy.  Reportedly has headache.  Reportedly has had fevers.  Afebrile here.   Differential diagnosis is long but includes causes such as UTIs, infection, intra-abdominal infection.  Reviewed previous infectious disease note.  Will get blood work and urinalysis.  Now discussed with son.  Has been decreased activity over the last couple weeks.  Reportedly still in bed.  Reportedly seen yesterday but had not moved since that time.  Does have diffuse abdominal tenderness.  Will get CT scan and chest x-ray to further evaluate.  Also get head CT due to mental status change.  With abdominal tenderness will get CT scan of the abdomen pelvis.  On reexam patient more alert and more energetic.  Care turned over to Dr. Aldean Amass.        Final Clinical Impression(s) / ED Diagnoses Final diagnoses:  None    Rx / DC Orders ED Discharge Orders     None         Mozell Arias, MD 07/24/23 [phone]

## 2023-07-25 ENCOUNTER — Other Ambulatory Visit (INDEPENDENT_AMBULATORY_CARE_PROVIDER_SITE_OTHER): Payer: Self-pay | Admitting: Gastroenterology

## 2023-07-25 ENCOUNTER — Other Ambulatory Visit: Payer: Self-pay | Admitting: Internal Medicine

## 2023-07-25 DIAGNOSIS — B20 Human immunodeficiency virus [HIV] disease: Secondary | ICD-10-CM

## 2023-07-25 DIAGNOSIS — K529 Noninfective gastroenteritis and colitis, unspecified: Secondary | ICD-10-CM | POA: Diagnosis not present

## 2023-07-25 LAB — BASIC METABOLIC PANEL WITH GFR
Anion gap: 9 (ref 5–15)
BUN: 19 mg/dL (ref 6–20)
CO2: 22 mmol/L (ref 22–32)
Calcium: 7 mg/dL — ABNORMAL LOW (ref 8.9–10.3)
Chloride: 109 mmol/L (ref 98–111)
Creatinine, Ser: 0.93 mg/dL (ref 0.44–1.00)
GFR, Estimated: >60 mL/min (ref >60)
Glucose, Bld: 108 mg/dL — ABNORMAL HIGH (ref 70–99)
Potassium: 3.6 mmol/L (ref 3.5–5.1)
Sodium: 140 mmol/L (ref 135–145)

## 2023-07-25 LAB — CBC
HCT: 30 % — ABNORMAL LOW (ref 36.0–46.0)
Hemoglobin: 10.8 g/dL — ABNORMAL LOW (ref 12.0–15.0)
MCH: 34 pg (ref 26.0–34.0)
MCHC: 36 g/dL (ref 30.0–36.0)
MCV: 94.3 fL (ref 80.0–100.0)
Platelets: 219 10*3/uL (ref 150–400)
RBC: 3.18 MIL/uL — ABNORMAL LOW (ref 3.87–5.11)
RDW: 14.5 % (ref 11.5–15.5)
WBC: 8.3 10*3/uL (ref 4.0–10.5)
nRBC: 0.2 % (ref 0.0–0.2)

## 2023-07-25 MED ORDER — ALBUTEROL SULFATE HFA 108 (90 BASE) MCG/ACT IN AERS: INHALATION_SPRAY | RESPIRATORY_TRACT | 4 refills | Status: DC

## 2023-07-25 MED ORDER — BICTEGRAVIR-EMTRICITAB-TENOFOV 50-200-25 MG PO TABS
1.0000 | ORAL_TABLET | Freq: Every day | ORAL | Status: DC
  Filled 2023-07-25: qty 1

## 2023-07-25 MED ORDER — ONDANSETRON HCL 4 MG PO TABS: 4.0000 mg | ORAL_TABLET | Freq: Four times a day (QID) | ORAL | 0 refills | Status: DC | PRN

## 2023-07-25 MED ORDER — MAGNESIUM SULFATE 4 GM/100ML IV SOLN
4.0000 g | Freq: Once | INTRAVENOUS | Status: AC
  Administered 2023-07-25: 4 g via INTRAVENOUS
  Filled 2023-07-25: qty 100

## 2023-07-25 MED ORDER — LOPERAMIDE HCL 2 MG PO TABS: 2.0000 mg | ORAL_TABLET | Freq: Three times a day (TID) | ORAL | 0 refills | Status: DC | PRN

## 2023-07-25 MED ORDER — AMLODIPINE BESYLATE 2.5 MG PO TABS: 2.5000 mg | ORAL_TABLET | Freq: Every day | ORAL | 1 refills | Status: DC

## 2023-07-25 NOTE — Progress Notes (Signed)
 Patient c/o feeling dizzy , and double vision BP is stable , held norvasc  , notified Dr. Josiah Nigh   07/25/23 0859  Vitals  BP 101/70  MEWS COLOR  MEWS Score Color Green  MEWS Score  MEWS Temp 0  MEWS Systolic 0  MEWS Pulse 0  MEWS RR 0  MEWS LOC 0  MEWS Score 0

## 2023-07-25 NOTE — Discharge Instructions (Addendum)
 1) The 'BRAT' diet is suggested, then progress to diet as tolerated as symptoms abate.  -- BRAT (bananas, rice, apples, toast) -you may also consume other mild foods that ease the GI tract such as saltines, oatmeal, or boiled potatoes. Call if bloody stools, persistent diarrhea, vomiting, fever or abdominal pain.  2) please drink plenty fluids , avoid Dehydration   ---------  You are to resume services with: [facility]      [phone]  If additional services are needed please follow-up with your primary care physician.

## 2023-07-25 NOTE — Telephone Encounter (Signed)
 Please advise regarding DDI with sucralfate . Just prescribed today by ED - thanks!

## 2023-07-25 NOTE — Telephone Encounter (Signed)
 Can continue as she needs - needs to space out Biktarvy  2 hours before or 6 hours after sucralfate  which I know may be difficult with taking around meals. Would say take Biktarvy  first thing in the morning and wait a couple hours to eat breakfast.

## 2023-07-25 NOTE — Discharge Summary (Signed)
 Ashley [name], is a 58 y.o. female  DOB 01/18/1966  MRN [id number].  Admission date:  07/24/2023  Admitting Physician  Pati Bonine, MD  Discharge Date:  07/25/2023   Primary MD  Eliodoro Guerin, DO  Recommendations for primary care physician for things to follow:   1) The 'BRAT' diet is suggested, then progress to diet as tolerated as symptoms abate.  -- BRAT (bananas, rice, apples, toast) -you may also consume other mild foods that ease the GI tract such as saltines, oatmeal, or boiled potatoes. Call if bloody stools, persistent diarrhea, vomiting, fever or abdominal pain.  2) please drink plenty fluids , avoid Dehydration  Admission Diagnosis  Gastroenteritis [K52.9]   Discharge Diagnosis  Gastroenteritis [K52.9]    Principal Problem:   Gastroenteritis Active Problems:   Hypokalemia   Human immunodeficiency virus (HIV) disease (HCC)   Diet-controlled diabetes mellitus (HCC)   Schizophrenia (HCC)   BIPOLAR AFFECTIVE DISORDER, DEPRESSED, SEVERE   Hypertension associated with diabetes (HCC)   Liver fibrosis   Pulmonary emphysema (HCC)      Past Medical History:  Diagnosis Date   Ankle fracture    Anxiety    Asthma    Collagen vascular disease (HCC)    COPD (chronic obstructive pulmonary disease) (HCC)    Depression    Diabetes mellitus    Elevated LFTs 01/13/2012   GERD (gastroesophageal reflux disease)    Hepatitis C    Hernia    HIV (human immunodeficiency virus infection) (HCC)    HIV (human immunodeficiency virus infection) (HCC)    Hypertension    Hypothyroidism    Pinched nerve    Scoliosis    TB (tuberculosis)     Past Surgical History:  Procedure Laterality Date   BIOPSY  05/08/2018   Procedure: biopsy;  Surgeon: Ruby Corporal, MD;  [facility];  Service: Endoscopy;;  proximal transverse colon polyp   BIOPSY  10/22/2019   Procedure: BIOPSY;   Surgeon: Ruby Corporal, MD;  [facility];  Service: Endoscopy;;   BIOPSY  07/29/2021   Procedure: BIOPSY;  Surgeon: Urban Garden, MD;  [facility];  Service: Gastroenterology;;   CESAREAN SECTION     CHOLECYSTECTOMY     COLONOSCOPY N/A 05/08/2018   Procedure: COLONOSCOPY;  Surgeon: Ruby Corporal, MD;  [facility];  Service: Endoscopy;  Laterality: N/A;  2:00   COLONOSCOPY WITH PROPOFOL  N/A 07/29/2021   Procedure: COLONOSCOPY WITH PROPOFOL ;  Surgeon: Urban Garden, MD;  [facility];  Service: Gastroenterology;  Laterality: N/A;  1245   ESOPHAGEAL DILATION  10/22/2019   Procedure: ESOPHAGEAL DILATION;  Surgeon: Ruby Corporal, MD;  [facility];  Service: Endoscopy;;   ESOPHAGOGASTRODUODENOSCOPY (EGD) WITH PROPOFOL  N/A 10/22/2019   Procedure: ESOPHAGOGASTRODUODENOSCOPY (EGD) WITH PROPOFOL ;  Surgeon: Ruby Corporal, MD;  [facility];  Service: Endoscopy;  Laterality: N/A;  1220   ESOPHAGOGASTRODUODENOSCOPY (EGD) WITH PROPOFOL  N/A 07/29/2021   Procedure: ESOPHAGOGASTRODUODENOSCOPY (EGD) WITH  PROPOFOL ;  Surgeon: Umberto Ganong, Bearl Limes, MD;  [facility];  Service: Gastroenterology;  Laterality: N/A;   HERNIA REPAIR     x2   INCISIONAL HERNIA REPAIR N/A 09/30/2018   Procedure: OPEN HERNIA REPAIR INCISIONAL WITH MESH;  Surgeon: Awilda Bogus, MD;  Location: [facility];  Service: General;  Laterality: N/A;   LIVER BIOPSY      HPI  from the history and physical done on the day of admission:   Chief Complaint: Dizziness   HPI: Ashley [name] is a 58 y.o. female with medical history significant for HIV, hypertension, emphysema, diabetes mellitus, bipolar disorder, depression, tobacco abuse. Patient was brought to the ED with complaints of dizziness, headache, weakness, fever, and reported altered mental status of 2 weeks duration.  At the time of my evaluation, patient is awake alert oriented  x 4 and able to answer questions appropriately.  Patient's son Ashley [name] is at bedside. Patient reported over the past 3 weeks she has had multiple episodes of vomiting, and diarrhea daily.  Reports mid abdominal pain.  She reports dizziness when standing and that she is unable to ambulate.  She reports generalized weakness.  I asked patient's son about mental status, reports patient's speech response has been slow, but denies any confused speech. Patient's caregiver came to see patient today apparently patient locked the door, but per patient this was not intentional.  Caregiver came in through the window.  She felt patient was not at her baseline, and recommended family call EMS.   ED Course: Temperature 98.3.  Heart rate 88-98.  Respiratory rate 16-21.  Blood pressure systolic 149-157.  O2 sat greater than 98% on room air. Potassium low at 3.  Mild anion gap of 17, serum bicarb of 20. WBC normal at 7.2. CT abdomen and pelvis with contrast without acute abnormality.  Head CT negative for acute abnormality. Chest x-ray clear. 1 L bolus given, KCL, meclizine  given.   Review of Systems: As per HPI all other systems reviewed and negative.    Hospital Course:   Assessment and Plan: 1)Gastroenteritis Presenting with multiple episodes of vomiting and diarrhea over the past 3 weeks. With abdominal pain, dizziness, hypokalemia.  - She is afebrile without leukocytosis.  She has a mild anion gap metabolic acidosis with bicarb of 22, anion gap of 17. CT abdomen and pelvis without acute abnormality.  Mental status is intact, no focal neurologic deficits appreciated. --Much improved with IV Fluids -Tolerating oral intake well -Unable to send Stool for C. Difficile and GI pathogen panel as pt No longer has loose stools. No further BM --Overall GI symptoms resolved -Held off on antibiotics at this time  2)Hypokalemia/Hypomagensiemia--  due to GI Losses  -Replaced   3)Hypertension associated with  diabetes (HCC) Stable. - Continue amlodipine  and losartan   4) anion gap metabolic acidosis--- due to #1 above - Resolved with hydration  5)BIPOLAR AFFECTIVE DISORDER, DEPRESSED, SEVERE C/n  fluoxetine , hydroxyzine   6)Diet-controlled diabetes mellitus (HCC) Controlled.  A1c 5.2.  Not on medication.  7)Human immunodeficiency virus (HIV) disease (HCC) 12/2022- Last counts HIV viral load 1.8, T helper cells- 657. -Resume Biktarvy   8)GERD--able, continue Carafate  and omeprazole   Discharge Condition: stable  Follow UP   Follow-up Information     Vicky Grange M, DO Follow up in 3 day(s).   Specialty: Family Medicine Why: If symptoms worsen Contact information: [address] [place] [address] [phone]  Diet and Activity recommendation:  As advised  Discharge Instructions    Discharge Instructions     Call MD for:  persistant dizziness or light-headedness   Complete by: As directed    Call MD for:  persistant nausea and vomiting   Complete by: As directed    Call MD for:  severe uncontrolled pain   Complete by: As directed    Diet - low sodium heart healthy   Complete by: As directed    Discharge instructions   Complete by: As directed    1) The 'BRAT' diet is suggested, then progress to diet as tolerated as symptoms abate.  -- BRAT (bananas, rice, apples, toast) -you may also consume other mild foods that ease the GI tract such as saltines, oatmeal, or boiled potatoes. Call if bloody stools, persistent diarrhea, vomiting, fever or abdominal pain.  2) please drink plenty fluids , avoid Dehydration   Increase activity slowly   Complete by: As directed        Discharge Medications     Allergies as of 07/25/2023       Reactions   Aspirin Other (See Comments)   Liver problems   Tomato Other (See Comments)   Acid reflux.        Medication List     TAKE these medications    acetaminophen  500 MG tablet Commonly known as:  TYLENOL  Take 1 tablet (500 mg total) by mouth every 6 (six) hours as needed (pain.).   albuterol  108 (90 Base) MCG/ACT inhaler Commonly known as: Ventolin  HFA INHALE TWO PUFFS INTO THE LUNGS EVERY 6 HOURS AS NEEDED FOR WHEEZING OR SHORTNESS OF BREATH   Allergy Relief 10 MG tablet Generic drug: loratadine  TAKE ONE TABLET BY MOUTH EVERY DAY   amLODipine  2.5 MG tablet Commonly known as: NORVASC  Take 1 tablet (2.5 mg total) by mouth daily. What changed: See the new instructions.   Biktarvy  50-200-25 MG Tabs tablet Generic drug: bictegravir-emtricitabine -tenofovir  AF TAKE ONE TABLET BY MOUTH DAILY (NEEDS OFFICE VISIT FOR FURTHER REFILLS)   diclofenac  Sodium 1 % Gel Commonly known as: VOLTAREN  APPLY 4 GRAMS TOPICALLY TO THE AFFECTED AREA FOUR TIMES DAILY AS NEEDED FOR KNEE PAIN OR ARTHRITIS   FLUoxetine  40 MG capsule Commonly known as: PROZAC  TAKE ONE CAPSULE BY MOUTH EVERY DAY.   hydrOXYzine  25 MG capsule Commonly known as: VISTARIL  TAKE 1 CAPSULE(25 MG) BY MOUTH EVERY 8 HOURS AS NEEDED FOR ANXIETY   loperamide  2 MG tablet Commonly known as: Imodium  A-D Take 1 tablet (2 mg total) by mouth 3 (three) times daily as needed for diarrhea or loose stools.   losartan  25 MG tablet Commonly known as: COZAAR  TAKE 1 TABLET(25 MG) BY MOUTH DAILY FOR BLOOD PRESSURE   meclizine  25 MG tablet Commonly known as: ANTIVERT  TAKE ONE TABLET BY MOUTH TWICE DAILY AS NEEDED FOR DIZZINESS. MUST LAST UNTIL NEUROLOGY VISIT   olopatadine  0.1 % ophthalmic solution Commonly known as: Pataday  Place 1 drop into both eyes daily. For allergy   omeprazole  40 MG capsule Commonly known as: PRILOSEC TAKE ONE CAPSULE BY MOUTH TWICE DAILY   ondansetron  4 MG tablet Commonly known as: ZOFRAN  Take 1 tablet (4 mg total) by mouth every 6 (six) hours as needed for nausea.   sucralfate  1 GM/10ML suspension Commonly known as: CARAFATE  TAKE 10 MLS BY MOUTH FOUR TIMES DAILY WITH MEALS AND AT BEDTIME    Tab-A-Vite Tabs TAKE ONE TABLET BY MOUTH DAILY   tiZANidine  4 MG tablet Commonly known as:  ZANAFLEX  TAKE 1/2 TO 1 TABLET BY MOUTH EVERY 8 HOUR AS NEEDED FOR MUSCLE SPASMS. USE SPARINGLY.        Major procedures and Radiology Reports - PLEASE review detailed and final reports for all details, in brief -   CT ABDOMEN PELVIS W CONTRAST Result Date: 07/24/2023 CLINICAL DATA:  Abdominal pain, acute, nonlocalized, dizziness, intermittent fever, and weakness EXAM: CT ABDOMEN AND PELVIS WITH CONTRAST TECHNIQUE: Multidetector CT imaging of the abdomen and pelvis was performed using the standard protocol following bolus administration of intravenous contrast. RADIATION DOSE REDUCTION: This exam was performed according to the departmental dose-optimization program which includes automated exposure control, adjustment of the mA and/or kV according to patient size and/or use of iterative reconstruction technique. CONTRAST:  OMNIPAQUE  IOHEXOL  300 MG/ML  SOLN COMPARISON:  June 21, 2021, November 06 2019 FINDINGS: Lower chest: No focal airspace consolidation or pleural effusion.Multi-vessel coronary atherosclerosis. Hepatobiliary: No mass.Cholecystectomy. No intrahepatic or extrahepatic biliary ductal dilation. The portal veins are patent. Pancreas: No mass or main ductal dilation. No peripancreatic inflammation or fluid collection. Spleen: Normal size. No mass. Adrenals/Urinary Tract: Nodular thickening of the left adrenal gland, unchanged, without discrete mass or dominant nodule. Cortical scarring in the upper pole of the left kidney. Tiny hypodensity in the left kidney, unchanged, too small to definitively characterize, but likely a small cyst. No nephrolithiasis or hydronephrosis. The urinary bladder is distended without focal abnormality. Stomach/Bowel: The stomach is decompressed without focal abnormality. No small bowel wall thickening or inflammation. No small bowel obstruction.Normal appendix.  Vascular/Lymphatic: No aortic aneurysm. Scattered aortoiliac atherosclerosis. No intraabdominal or pelvic lymphadenopathy. Reproductive: Age-related atrophy of the uterus and ovaries. No concerning adnexal mass.No free pelvic fluid. Other: No pneumoperitoneum, ascites, or mesenteric inflammation. Musculoskeletal: No acute fracture or destructive lesion. Unchanged rectus diastasis. Multilevel degenerative disc disease of the thoracic spine with multilevel lumbar facet arthropathy. IMPRESSION: No acute intraabdominal or pelvic abnormality. Electronically Signed   By: Rance Burrows M.D.   On: 07/24/2023 15:20   CT Head Wo Contrast Result Date: 07/24/2023 CLINICAL DATA:  Head trauma, dizziness, headache. EXAM: CT HEAD WITHOUT CONTRAST TECHNIQUE: Contiguous axial images were obtained from the base of the skull through the vertex without intravenous contrast. RADIATION DOSE REDUCTION: This exam was performed according to the departmental dose-optimization program which includes automated exposure control, adjustment of the mA and/or kV according to patient size and/or use of iterative reconstruction technique. COMPARISON:  CT head 06/21/2021. FINDINGS: Brain: No acute intracranial hemorrhage. No CT evidence of acute infarct. Remote lacunar infarct in the right basal ganglia which is new since 2023. No edema, mass effect, or midline shift. The basilar cisterns are patent. Ventricles: The ventricles are normal. Vascular: No hyperdense vessel or unexpected calcification. Skull: No acute or aggressive finding. Orbits: Orbits are symmetric. Sinuses: Mucosal thickening in the right sphenoid sinus. Other: Small right mastoid effusion. IMPRESSION: No CT evidence of acute intracranial abnormality. Remote lacunar infarct in the right basal ganglia which is new since 2023. Small right mastoid effusion. Electronically Signed   By: Denny Flack M.D.   On: 07/24/2023 14:47   DG Chest Portable 1 View Result Date:  07/24/2023 CLINICAL DATA:  weakness EXAM: PORTABLE CHEST - 1 VIEW COMPARISON:  November 14, 2021 FINDINGS: Low lung volumes. No focal airspace consolidation, pleural effusion, or pneumothorax. Tortuous aorta, unchanged. No cardiomegaly. No acute fracture or destructive lesion. Cholecystectomy clips. Multilevel thoracic osteophytosis. IMPRESSION: Low lung volumes.  Otherwise, no acute cardiopulmonary abnormality. Electronically Signed  By: Rance Burrows M.D.   On: 07/24/2023 14:40   Today   Subjective    Ashley [name] today has no new complaints No fever  Or chills   No Nausea, Vomiting or Diarrhea -- No further BMs -Tolerating oral intake well         Patient has been seen and examined prior to discharge   Objective   Blood pressure 101/70, pulse 72, temperature 98.2 F (36.8 C), temperature source Oral, resp. rate 17, height 4' 11 (1.499 m), weight 60.8 kg, SpO2 100%.   Intake/Output Summary (Last 24 hours) at 07/25/2023 1659 Last data filed at 07/25/2023 1300 Gross per 24 hour  Intake 2356.34 ml  Output 613 ml  Net 1743.34 ml    Exam Gen:- Awake Alert, no acute distress  HEENT:- [place].AT, No sclera icterus Neck-Supple Neck,No JVD,.  Lungs-  CTAB , good air movement bilaterally CV- S1, S2 normal, regular Abd-  +ve B.Sounds, Abd Soft, No significant abdominal tenderness,    Extremity/Skin:- No  edema,   good pulses Psych-affect is appropriate, oriented x3 Neuro-no new focal deficits, no tremors    Data Review   CBC w Diff:  Lab Results  Component Value Date   WBC 8.3 07/25/2023   HGB 10.8 (L) 07/25/2023   HGB 11.5 03/15/2022   HCT 30.0 (L) 07/25/2023   HCT 34.7 03/15/2022   PLT 219 07/25/2023   PLT 173 03/15/2022   LYMPHOPCT 36 06/18/2019   LYMPHOPCT 11.9 10/16/2013   BANDSPCT 0 12/19/2011   MONOPCT 8.3 03/31/2021   MONOPCT 10.8 10/16/2013   EOSPCT 1.8 03/31/2021   EOSPCT 0.6 10/16/2013   BASOPCT 1.0 03/31/2021   BASOPCT 0.3 10/16/2013   CMP:  Lab  Results  Component Value Date   NA 140 07/25/2023   NA 143 12/19/2022   NA 140 10/16/2013   K 3.6 07/25/2023   K 3.7 10/16/2013   CL 109 07/25/2023   CL 109 (H) 10/16/2013   CO2 22 07/25/2023   CO2 21 10/16/2013   BUN 19 07/25/2023   BUN 14 12/19/2022   BUN 8 10/16/2013   CREATININE 0.93 07/25/2023   CREATININE 0.97 03/31/2021   PROT 8.0 07/24/2023   PROT 6.5 12/19/2022   PROT 8.6 (H) 10/14/2013   ALBUMIN 4.3 07/24/2023   ALBUMIN 4.1 12/19/2022   ALBUMIN 3.6 10/14/2013   BILITOT 0.7 07/24/2023   BILITOT <0.2 12/19/2022   BILITOT 0.9 10/14/2013   ALKPHOS 60 07/24/2023   ALKPHOS 88 10/14/2013   AST 53 (H) 07/24/2023   AST 74 (H) 10/14/2013   ALT 31 07/24/2023   ALT 30 (H) 07/27/2016  .  Total Discharge time is about 33 minutes  Colin Dawley M.D on 07/25/2023 at 4:59 PM  Go to [email or web address] -  for contact info  [facility] - Office  [phone]

## 2023-07-25 NOTE — Telephone Encounter (Signed)
 Patient currently admitted at [facility], will need to address once discharged.   Irys Nigh, BSN, RN

## 2023-07-25 NOTE — Progress Notes (Signed)
   07/25/23 1803  TOC Discharge Assessment  Final next level of care Home w Home Health Services  Once discharged, how will the patient get to their discharge location? Family/Friend - Partnered Transport  Has discharge transport plan been identified? Yes  Barriers to Discharge Barriers Resolved  Patient states their goals for this hospitalization and ongoing recovery are: To return home.   Pt to resume services c/[facility] [phone], CNA 6 days/wk. Michelle at [facility] notified of dc. Pt to dc home c/son.

## 2023-07-26 ENCOUNTER — Telehealth: Payer: Self-pay

## 2023-07-26 ENCOUNTER — Other Ambulatory Visit: Payer: Self-pay | Admitting: Family Medicine

## 2023-07-26 DIAGNOSIS — R42 Dizziness and giddiness: Secondary | ICD-10-CM

## 2023-07-26 DIAGNOSIS — H8110 Benign paroxysmal vertigo, unspecified ear: Secondary | ICD-10-CM

## 2023-07-26 NOTE — Telephone Encounter (Signed)
 Spoke with Ashley [name], relayed pharmacist's recommendation to take Biktarvy  first thing in the morning and then wait two hours before eating breakfast and taking sucralfate . Patient verbalized understanding and has no further questions.   [name], BSN, RN

## 2023-07-26 NOTE — Transitions of Care (Post Inpatient/ED Visit) (Signed)
 07/26/2023  Name: Ashley [name] MRN: [id number] DOB: 01/02/1966  Today's TOC FU Call Status: Today's TOC FU Call Status:: Successful TOC FU Call Completed TOC FU Call Complete Date: 07/26/23 Patient's Name and Date of Birth confirmed.  Transition Care Management Follow-up Telephone Call Date of Discharge: 07/25/23 Discharge Facility[facility] ([facility] Type of Discharge: Inpatient Admission Primary Inpatient Discharge Diagnosis:: gastroenteritis How have you been since you were released from the hospital?: Better Any questions or concerns?: No  Items Reviewed: Did you receive and understand the discharge instructions provided?: Yes Medications obtained,verified, and reconciled?: Yes (Medications Reviewed) Any new allergies since your discharge?: No Dietary orders reviewed?: Yes Do you have support at home?: Yes People in Home [RPT]: friend(s)  Medications Reviewed Today: Medications Reviewed Today     Reviewed by Darrall Ellison, LPN (Licensed Practical Nurse) on 07/26/23 at 1221  Med List Status: <None>   Medication Order Taking? Sig Documenting Provider Last Dose Status Informant  acetaminophen  (TYLENOL ) 500 MG tablet [id number] No Take 1 tablet (500 mg total) by mouth every 6 (six) hours as needed (pain.). Lorel Roes, NP Unknown Active Child           Med Note (WARD, ANGELICA G   Tue Jul 24, 2023  7:09 PM) Pt is uncooperative and agitated, will not verify last doses for PRN meds   albuterol  (VENTOLIN  HFA) 108 (90 Base) MCG/ACT inhaler [id number]  INHALE TWO PUFFS INTO THE LUNGS EVERY 6 HOURS AS NEEDED FOR WHEEZING OR SHORTNESS OF Abbott Abbot, Courage, MD  Active   amLODipine  (NORVASC ) 2.5 MG tablet [id number]  Take 1 tablet (2.5 mg total) by mouth daily. Colin Dawley, MD  Active   BIKTARVY  50-200-25 MG TABS tablet [id number]  TAKE ONE TABLET BY MOUTH DAILY Orlie Bjornstad, MD  Active   diclofenac  Sodium (VOLTAREN ) 1 % GEL [id number] No APPLY 4 GRAMS TOPICALLY TO THE  AFFECTED AREA FOUR TIMES DAILY AS NEEDED FOR KNEE PAIN OR ARTHRITIS Vicky Grange M, DO 07/20/2023 Active Child  FLUoxetine  (PROZAC ) 40 MG capsule [id number] No TAKE ONE CAPSULE BY MOUTH EVERY DAY. Vicky Grange M, DO 07/20/2023 Active Child  hydrOXYzine  (VISTARIL ) 25 MG capsule [id number] No TAKE 1 CAPSULE(25 MG) BY MOUTH EVERY 8 HOURS AS NEEDED FOR ANXIETY Eliodoro Guerin, DO Unknown Active Child           Med Note (WARD, ANGELICA G   Tue Jul 24, 2023  7:08 PM) Pt is uncooperative and agitated, will not verify last doses for PRN meds   loperamide (IMODIUM A-D) 2 MG tablet [id number]  Take 1 tablet (2 mg total) by mouth 3 (three) times daily as needed for diarrhea or loose stools. Colin Dawley, MD  Active   loratadine  (ALLERGY RELIEF) 10 MG tablet [id number] No TAKE ONE TABLET BY MOUTH EVERY DAY Vicky Grange M, DO 07/20/2023 Active Child  losartan  (COZAAR ) 25 MG tablet [id number] No TAKE 1 TABLET(25 MG) BY MOUTH DAILY FOR BLOOD PRESSURE Vicky Grange M, DO 07/20/2023 Active Child  meclizine  (ANTIVERT ) 25 MG tablet [id number] No TAKE ONE TABLET BY MOUTH TWICE DAILY AS NEEDED FOR DIZZINESS. MUST LAST UNTIL NEUROLOGY VISIT Eliodoro Guerin, DO Unknown Active Child           Med Note (WARD, ANGELICA G   Tue Jul 24, 2023  7:08 PM) Pt is uncooperative and agitated, will not verify last doses for PRN meds   Multiple Vitamin (TAB-A-VITE) TABS [id number] No TAKE ONE TABLET BY MOUTH DAILY Vicky Grange  M, DO 07/20/2023 Active Child  olopatadine  (PATADAY ) 0.1 % ophthalmic solution [id number] No Place 1 drop into both eyes daily. For allergy Eliodoro Guerin, DO 07/20/2023 Active Child  omeprazole  (PRILOSEC) 40 MG capsule [id number]  TAKE ONE CAPSULE BY MOUTH TWICE DAILY Carlan, Chelsea L, NP  Active   ondansetron  (ZOFRAN ) 4 MG tablet [id number]  Take 1 tablet (4 mg total) by mouth every 6 (six) hours as needed for nausea. Colin Dawley, MD  Active   sucralfate  (CARAFATE ) 1 GM/10ML  suspension [id number]  TAKE 10 MLS BY MOUTH FOUR TIMES DAILY WITH MEALS AND AT BEDTIME Carlan, Chelsea L, NP  Active   tiZANidine  (ZANAFLEX ) 4 MG tablet [id number] No TAKE 1/2 TO 1 TABLET BY MOUTH EVERY 8 HOUR AS NEEDED FOR MUSCLE SPASMS. USE SPARINGLY. Eliodoro Guerin, DO Unknown Active Child           Med Note (WARD, ANGELICA G   Tue Jul 24, 2023  7:08 PM) Pt is uncooperative and agitated, will not verify last doses for PRN meds  Med List Note Barbee Bookman 10/01/10 1847): Patient gets medication thru [facility] in [place]..            Home Care and Equipment/Supplies: Were Home Health Services Ordered?: NA Any new equipment or medical supplies ordered?: NA  Functional Questionnaire: Do you need assistance with bathing/showering or dressing?: No Do you need assistance with meal preparation?: No Do you need assistance with eating?: No Do you have difficulty maintaining continence: No Do you need assistance with getting out of bed/getting out of a chair/moving?: No Do you have difficulty managing or taking your medications?: No  Follow up appointments reviewed: PCP Follow-up appointment confirmed?: NA Specialist Hospital Follow-up appointment confirmed?: Yes Date of Specialist follow-up appointment?: 08/21/23 Follow-Up Specialty Provider:: ID Do you need transportation to your follow-up appointment?: No Do you understand care options if your condition(s) worsen?: Yes-patient verbalized understanding    SIGNATURE Darrall Ellison, LPN [facility] Nurse Health Advisor Direct Dial [phone]

## 2023-07-27 LAB — URINE CULTURE: Culture: >=100000 — AB

## 2023-07-27 NOTE — Telephone Encounter (Signed)
NA / NVM 

## 2023-07-27 NOTE — Telephone Encounter (Signed)
 As per last SIG:  TAKE ONE TABLET BY MOUTH TWICE DAILY AS NEEDED FOR DIZZINESS. MUST LAST UNTIL NEUROLOGY VISIT  I don't see where she has seen them.

## 2023-07-30 ENCOUNTER — Ambulatory Visit: Payer: Self-pay | Admitting: Family Medicine

## 2023-08-02 ENCOUNTER — Ambulatory Visit (INDEPENDENT_AMBULATORY_CARE_PROVIDER_SITE_OTHER): Admitting: Gastroenterology

## 2023-08-07 ENCOUNTER — Ambulatory Visit (INDEPENDENT_AMBULATORY_CARE_PROVIDER_SITE_OTHER): Admitting: Gastroenterology

## 2023-08-07 ENCOUNTER — Encounter (INDEPENDENT_AMBULATORY_CARE_PROVIDER_SITE_OTHER): Payer: Self-pay | Admitting: Gastroenterology

## 2023-08-08 ENCOUNTER — Inpatient Hospital Stay: Admitting: Family Medicine

## 2023-08-09 ENCOUNTER — Encounter: Payer: Self-pay | Admitting: Family Medicine

## 2023-08-13 ENCOUNTER — Telehealth: Payer: Self-pay | Admitting: Family Medicine

## 2023-08-13 NOTE — Telephone Encounter (Signed)
 FYI

## 2023-08-13 NOTE — Telephone Encounter (Signed)
 Copied from CRM [id number]. Topic: General - Deceased Patient >> 08-25-2023  9:39 AM Star [place] wrote: Name of caller: Dominic- son  Date of death: 18-Aug-2023   Name of funeral home: [employer]  Phone number of funeral home: [phone]- [place]  Provider that needs to sign form: Dr. Bonnell Butcher  Timeline for signing: unknown

## 2023-08-21 ENCOUNTER — Telehealth: Payer: Self-pay

## 2023-08-21 ENCOUNTER — Ambulatory Visit: Admitting: Internal Medicine

## 2023-08-21 NOTE — Telephone Encounter (Signed)
 Request just received today.  Completed to the best of my ability.  I did not have information regarding exact time of death but did attempt to reach the funeral home and had to leave a VM with the call center.

## 2023-08-21 NOTE — Telephone Encounter (Signed)
 Copied from CRM [id number]. Topic: General - Deceased Patient >> 2023-08-30  3:33 PM Everette C wrote: Name of caller: Kenneth   Date of death: Aug 15, 2023   Name of funeral home: [facility]   Phone number of funeral home: [phone]  Provider that needs to sign form: Death Certificate  NCDAVES [id number]  Timeline for signing: The patient's service was 08/12/23

## 2023-08-28 DEATH — deceased

## 2024-01-14 ENCOUNTER — Encounter: Admitting: Family Medicine
